# Patient Record
Sex: Male | Born: 1950
Health system: Southern US, Community
[De-identification: ages and names within clinical notes are randomized; demographics above are authoritative.]

## PROBLEM LIST (undated history)

## (undated) DIAGNOSIS — I351 Nonrheumatic aortic (valve) insufficiency: Secondary | ICD-10-CM

## (undated) DIAGNOSIS — K449 Diaphragmatic hernia without obstruction or gangrene: Secondary | ICD-10-CM

## (undated) DIAGNOSIS — E119 Type 2 diabetes mellitus without complications: Secondary | ICD-10-CM

## (undated) DIAGNOSIS — R6881 Early satiety: Secondary | ICD-10-CM

## (undated) DIAGNOSIS — R011 Cardiac murmur, unspecified: Secondary | ICD-10-CM

## (undated) DIAGNOSIS — M1990 Unspecified osteoarthritis, unspecified site: Secondary | ICD-10-CM

## (undated) DIAGNOSIS — K0889 Other specified disorders of teeth and supporting structures: Secondary | ICD-10-CM

## (undated) DIAGNOSIS — Q2543 Congenital aneurysm of aorta: Secondary | ICD-10-CM

## (undated) DIAGNOSIS — G4730 Sleep apnea, unspecified: Secondary | ICD-10-CM

## (undated) DIAGNOSIS — E785 Hyperlipidemia, unspecified: Secondary | ICD-10-CM

## (undated) DIAGNOSIS — G629 Polyneuropathy, unspecified: Secondary | ICD-10-CM

## (undated) DIAGNOSIS — F419 Anxiety disorder, unspecified: Secondary | ICD-10-CM

## (undated) DIAGNOSIS — T7840XA Allergy, unspecified, initial encounter: Secondary | ICD-10-CM

## (undated) DIAGNOSIS — I4892 Unspecified atrial flutter: Secondary | ICD-10-CM

## (undated) DIAGNOSIS — G4489 Other headache syndrome: Principal | ICD-10-CM

## (undated) DIAGNOSIS — I10 Essential (primary) hypertension: Secondary | ICD-10-CM

## (undated) DIAGNOSIS — K222 Esophageal obstruction: Secondary | ICD-10-CM

## (undated) DIAGNOSIS — I509 Heart failure, unspecified: Secondary | ICD-10-CM

## (undated) DIAGNOSIS — G4733 Obstructive sleep apnea (adult) (pediatric): Secondary | ICD-10-CM

## (undated) DIAGNOSIS — K219 Gastro-esophageal reflux disease without esophagitis: Secondary | ICD-10-CM

## (undated) DIAGNOSIS — I7121 Aneurysm of the ascending aorta, without rupture: Secondary | ICD-10-CM

## (undated) HISTORY — DX: Other headache syndrome: G44.89

## (undated) HISTORY — DX: Early satiety: R68.81

## (undated) HISTORY — DX: Essential (primary) hypertension: I10

## (undated) HISTORY — DX: Other specified disorders of teeth and supporting structures: K08.89

## (undated) HISTORY — DX: Obstructive sleep apnea (adult) (pediatric): G47.33

## (undated) HISTORY — DX: Hyperlipidemia, unspecified: E78.5

## (undated) HISTORY — DX: Nonrheumatic aortic (valve) insufficiency: I35.1

## (undated) HISTORY — DX: Esophageal obstruction: K22.2

## (undated) HISTORY — PX: HERNIA REPAIR: SHX51

## (undated) HISTORY — DX: Gastro-esophageal reflux disease without esophagitis: K21.9

## (undated) HISTORY — DX: Type 2 diabetes mellitus without complications: E11.9

## (undated) HISTORY — PX: CARDIAC VALVE REPLACEMENT: SHX585

## (undated) HISTORY — PX: TONSILLECTOMY: SUR1361

## (undated) HISTORY — DX: Cardiac murmur, unspecified: R01.1

## (undated) HISTORY — DX: Allergy, unspecified, initial encounter: T78.40XA

## (undated) HISTORY — DX: Diaphragmatic hernia without obstruction or gangrene: K44.9

## (undated) HISTORY — DX: Anxiety disorder, unspecified: F41.9

## (undated) HISTORY — DX: Polyneuropathy, unspecified: G62.9

## (undated) HISTORY — DX: Unspecified osteoarthritis, unspecified site: M19.90

---

## 1992-08-03 HISTORY — PX: AORTIC VALVE REPLACEMENT: SHX41

## 1998-11-01 ENCOUNTER — Emergency Department (HOSPITAL_COMMUNITY): Admission: EM | Admit: 1998-11-01 | Discharge: 1998-11-01 | Payer: Self-pay | Admitting: Emergency Medicine

## 1998-11-01 ENCOUNTER — Encounter: Payer: Self-pay | Admitting: Emergency Medicine

## 1999-02-20 ENCOUNTER — Ambulatory Visit (HOSPITAL_COMMUNITY): Admission: RE | Admit: 1999-02-20 | Discharge: 1999-02-20 | Payer: Self-pay | Admitting: Cardiology

## 2000-08-03 HISTORY — PX: GANGLION CYST EXCISION: SHX1691

## 2000-08-27 ENCOUNTER — Other Ambulatory Visit: Admission: RE | Admit: 2000-08-27 | Discharge: 2000-08-27 | Payer: Self-pay | Admitting: Orthopedic Surgery

## 2000-09-07 DIAGNOSIS — K2900 Acute gastritis without bleeding: Secondary | ICD-10-CM | POA: Insufficient documentation

## 2000-09-16 ENCOUNTER — Encounter: Payer: Self-pay | Admitting: Internal Medicine

## 2000-09-16 ENCOUNTER — Ambulatory Visit (HOSPITAL_COMMUNITY): Admission: RE | Admit: 2000-09-16 | Discharge: 2000-09-16 | Payer: Self-pay | Admitting: *Deleted

## 2000-10-21 ENCOUNTER — Ambulatory Visit (HOSPITAL_COMMUNITY): Admission: RE | Admit: 2000-10-21 | Discharge: 2000-10-21 | Payer: Self-pay | Admitting: Internal Medicine

## 2000-10-21 ENCOUNTER — Encounter: Payer: Self-pay | Admitting: Internal Medicine

## 2000-10-21 DIAGNOSIS — K449 Diaphragmatic hernia without obstruction or gangrene: Secondary | ICD-10-CM | POA: Insufficient documentation

## 2001-07-03 HISTORY — PX: VASECTOMY: SHX75

## 2001-07-03 HISTORY — PX: HIATAL HERNIA REPAIR: SHX195

## 2001-08-01 ENCOUNTER — Encounter: Payer: Self-pay | Admitting: *Deleted

## 2001-08-01 ENCOUNTER — Encounter: Admission: RE | Admit: 2001-08-01 | Discharge: 2001-08-01 | Payer: Self-pay | Admitting: *Deleted

## 2001-08-02 ENCOUNTER — Encounter (INDEPENDENT_AMBULATORY_CARE_PROVIDER_SITE_OTHER): Payer: Self-pay | Admitting: *Deleted

## 2001-08-02 ENCOUNTER — Ambulatory Visit (HOSPITAL_BASED_OUTPATIENT_CLINIC_OR_DEPARTMENT_OTHER): Admission: RE | Admit: 2001-08-02 | Discharge: 2001-08-02 | Payer: Self-pay | Admitting: *Deleted

## 2001-12-08 ENCOUNTER — Encounter: Payer: Self-pay | Admitting: *Deleted

## 2001-12-08 ENCOUNTER — Inpatient Hospital Stay (HOSPITAL_COMMUNITY): Admission: EM | Admit: 2001-12-08 | Discharge: 2001-12-11 | Payer: Self-pay | Admitting: *Deleted

## 2003-03-29 ENCOUNTER — Ambulatory Visit (HOSPITAL_COMMUNITY): Admission: RE | Admit: 2003-03-29 | Discharge: 2003-03-29 | Payer: Self-pay | Admitting: Internal Medicine

## 2003-03-29 ENCOUNTER — Encounter: Payer: Self-pay | Admitting: Internal Medicine

## 2003-03-29 DIAGNOSIS — K219 Gastro-esophageal reflux disease without esophagitis: Secondary | ICD-10-CM

## 2003-03-29 DIAGNOSIS — K222 Esophageal obstruction: Secondary | ICD-10-CM

## 2004-04-08 ENCOUNTER — Ambulatory Visit (HOSPITAL_COMMUNITY): Admission: RE | Admit: 2004-04-08 | Discharge: 2004-04-08 | Payer: Self-pay | Admitting: Gastroenterology

## 2005-02-24 ENCOUNTER — Encounter: Admission: RE | Admit: 2005-02-24 | Discharge: 2005-02-24 | Payer: Self-pay | Admitting: Neurological Surgery

## 2006-06-04 ENCOUNTER — Ambulatory Visit: Payer: Self-pay | Admitting: Family Medicine

## 2007-05-05 ENCOUNTER — Ambulatory Visit: Payer: Self-pay | Admitting: Family Medicine

## 2007-05-05 DIAGNOSIS — K089 Disorder of teeth and supporting structures, unspecified: Secondary | ICD-10-CM | POA: Insufficient documentation

## 2007-09-28 ENCOUNTER — Encounter (INDEPENDENT_AMBULATORY_CARE_PROVIDER_SITE_OTHER): Payer: Self-pay | Admitting: Family Medicine

## 2008-05-10 ENCOUNTER — Ambulatory Visit: Payer: Self-pay | Admitting: Family Medicine

## 2008-08-08 ENCOUNTER — Ambulatory Visit: Payer: Self-pay | Admitting: Family Medicine

## 2008-08-08 DIAGNOSIS — E785 Hyperlipidemia, unspecified: Secondary | ICD-10-CM

## 2008-08-08 DIAGNOSIS — E1169 Type 2 diabetes mellitus with other specified complication: Secondary | ICD-10-CM | POA: Insufficient documentation

## 2008-08-08 DIAGNOSIS — R6881 Early satiety: Secondary | ICD-10-CM | POA: Insufficient documentation

## 2008-08-08 DIAGNOSIS — I10 Essential (primary) hypertension: Secondary | ICD-10-CM

## 2008-08-10 ENCOUNTER — Encounter (INDEPENDENT_AMBULATORY_CARE_PROVIDER_SITE_OTHER): Payer: Self-pay | Admitting: *Deleted

## 2008-08-10 LAB — CONVERTED CEMR LAB
ALT: 29 units/L (ref 0–53)
Albumin: 4.3 g/dL (ref 3.5–5.2)
Amylase: 80 units/L (ref 27–131)
BUN: 13 mg/dL (ref 6–23)
Basophils Absolute: 0 10*3/uL (ref 0.0–0.1)
Basophils Relative: 0.4 % (ref 0.0–3.0)
CO2: 24 meq/L (ref 19–32)
Calcium: 9.7 mg/dL (ref 8.4–10.5)
Creatinine, Ser: 0.9 mg/dL (ref 0.4–1.5)
Eosinophils Relative: 1.2 % (ref 0.0–5.0)
Hemoglobin: 15.8 g/dL (ref 13.0–17.0)
Lipase: 26 units/L (ref 11.0–59.0)
Lymphocytes Relative: 26.7 % (ref 12.0–46.0)
MCHC: 33.6 g/dL (ref 30.0–36.0)
MCV: 90.9 fL (ref 78.0–100.0)
Neutro Abs: 5.7 10*3/uL (ref 1.4–7.7)
RBC: 5.16 M/uL (ref 4.22–5.81)
Total Bilirubin: 1.1 mg/dL (ref 0.3–1.2)
Total Protein: 7.1 g/dL (ref 6.0–8.3)

## 2008-09-24 ENCOUNTER — Ambulatory Visit: Payer: Self-pay | Admitting: Internal Medicine

## 2008-09-24 DIAGNOSIS — R142 Eructation: Secondary | ICD-10-CM

## 2008-09-24 DIAGNOSIS — R143 Flatulence: Secondary | ICD-10-CM

## 2008-09-24 DIAGNOSIS — R141 Gas pain: Secondary | ICD-10-CM

## 2008-09-26 ENCOUNTER — Ambulatory Visit (HOSPITAL_COMMUNITY): Admission: RE | Admit: 2008-09-26 | Discharge: 2008-09-26 | Payer: Self-pay | Admitting: Internal Medicine

## 2008-12-04 ENCOUNTER — Telehealth (INDEPENDENT_AMBULATORY_CARE_PROVIDER_SITE_OTHER): Payer: Self-pay | Admitting: *Deleted

## 2008-12-10 ENCOUNTER — Encounter: Payer: Self-pay | Admitting: Internal Medicine

## 2008-12-10 DIAGNOSIS — K828 Other specified diseases of gallbladder: Secondary | ICD-10-CM | POA: Insufficient documentation

## 2008-12-14 ENCOUNTER — Ambulatory Visit (HOSPITAL_COMMUNITY): Admission: RE | Admit: 2008-12-14 | Discharge: 2008-12-14 | Payer: Self-pay | Admitting: Internal Medicine

## 2009-01-24 ENCOUNTER — Encounter: Payer: Self-pay | Admitting: Family Medicine

## 2009-04-30 ENCOUNTER — Encounter: Payer: Self-pay | Admitting: Family Medicine

## 2009-05-17 ENCOUNTER — Encounter: Payer: Self-pay | Admitting: Family Medicine

## 2009-06-10 ENCOUNTER — Ambulatory Visit: Payer: Self-pay | Admitting: Family

## 2009-06-10 DIAGNOSIS — G5600 Carpal tunnel syndrome, unspecified upper limb: Secondary | ICD-10-CM | POA: Insufficient documentation

## 2009-06-10 DIAGNOSIS — H6120 Impacted cerumen, unspecified ear: Secondary | ICD-10-CM

## 2009-06-10 DIAGNOSIS — M25519 Pain in unspecified shoulder: Secondary | ICD-10-CM

## 2009-06-10 DIAGNOSIS — Z954 Presence of other heart-valve replacement: Secondary | ICD-10-CM | POA: Insufficient documentation

## 2009-06-13 ENCOUNTER — Encounter: Payer: Self-pay | Admitting: Internal Medicine

## 2009-09-03 ENCOUNTER — Encounter: Payer: Self-pay | Admitting: Family

## 2009-11-21 ENCOUNTER — Encounter: Payer: Self-pay | Admitting: Family Medicine

## 2009-11-21 ENCOUNTER — Encounter (INDEPENDENT_AMBULATORY_CARE_PROVIDER_SITE_OTHER): Payer: Self-pay | Admitting: *Deleted

## 2009-11-21 LAB — CONVERTED CEMR LAB
Albumin: 4.3 g/dL
Calcium: 9.1 mg/dL
Creatinine, Ser: 0.92 mg/dL
Globulin: 2.4 g/dL
Glucose, Bld: 89 mg/dL
HDL: 34 mg/dL
LDL Cholesterol: 71 mg/dL
Total Protein: 6.7 g/dL
Triglycerides: 98 mg/dL

## 2009-11-25 ENCOUNTER — Encounter: Payer: Self-pay | Admitting: Cardiology

## 2009-11-25 ENCOUNTER — Encounter: Payer: Self-pay | Admitting: Family Medicine

## 2010-03-11 ENCOUNTER — Encounter: Payer: Self-pay | Admitting: Family Medicine

## 2010-05-28 ENCOUNTER — Ambulatory Visit: Payer: Self-pay | Admitting: Cardiology

## 2010-05-29 ENCOUNTER — Ambulatory Visit: Payer: Self-pay | Admitting: Family Medicine

## 2010-09-04 NOTE — Letter (Signed)
Summary: [facility]   Imported By: Lanelle Bal 12/10/2009 09:10:23  _____________________________________________________________________  External Attachment:    Type:   Image     Comment:   External Document

## 2010-09-04 NOTE — Letter (Signed)
Summary: [facility]   Imported By: Lanelle Bal 03/20/2010 11:21:56  _____________________________________________________________________  External Attachment:    Type:   Image     Comment:   External Document

## 2010-09-04 NOTE — Miscellaneous (Signed)
Summary: labs-Dr. Deborah Chalk  Clinical Lists Changes  Observations: Added new observation of TRIGLYC TOT: 98 mg/dL (16/05/9603 54:09) Added new observation of LDL: 71 mg/dL (81/19/1478 29:56) Added new observation of HDL: 34 mg/dL (21/30/8657 84:69) Added new observation of CHOLESTEROL: 125 mg/dL (62/95/2841 32:44) Added new observation of BILI TOTAL: 1.0 mg/dL (08/05/7251 66:44) Added new observation of ALK PHOS: 69 units/L (11/21/2009 10:10) Added new observation of SGPT (ALT): 28 units/L (11/21/2009 10:10) Added new observation of SGOT (AST): 24 units/L (11/21/2009 10:10) Added new observation of PROTEIN, TOT: 6.7 g/dL (03/47/4259 56:38) Added new observation of GLOBULIN TOT: 2.4 g/dL (75/64/3329 51:88) Added new observation of ALBUMIN: 4.3 g/dL (41/66/0630 16:01) Added new observation of CALCIUM: 9.1 mg/dL (09/32/3557 32:20) Added new observation of GLUCOSE SER: 89 mg/dL (25/42/7062 37:62) Added new observation of CREATININE: 0.92 mg/dL (83/15/1761 60:73) Added new observation of BUN: 16 mg/dL (71/01/2693 85:46) Added new observation of CO2 TOTAL: 19 mmol/L (11/21/2009 10:10) Added new observation of CHLORIDE: 105 mmol/L (11/21/2009 10:10) Added new observation of POTASSIUM: 4.5 mmol/L (11/21/2009 10:10) Added new observation of SODIUM: 138 mmol/L (11/21/2009 10:10)

## 2010-09-04 NOTE — Letter (Signed)
Summary: [facility]  [facility]   Imported By: Lanelle Bal 09/10/2009 09:11:12  _____________________________________________________________________  External Attachment:    Type:   Image     Comment:   External Document

## 2010-09-04 NOTE — Assessment & Plan Note (Signed)
Summary: FLU SHOT/KN  Nurse Visit   Allergies: 1)  ! Penicillin 2)  ! * Quinadine  Orders Added: 1)  Admin 1st Vaccine [90471] 2)  Flu Vaccine 54yrs + [69629] Flu Vaccine Consent Questions     Do you have a history of severe allergic reactions to this vaccine? no    Any prior history of allergic reactions to egg and/or gelatin? no    Do you have a sensitivity to the preservative Thimersol? no    Do you have a past history of Guillan-Barre Syndrome? no    Do you currently have an acute febrile illness? no    Have you ever had a severe reaction to latex? no    Vaccine information given and explained to patient? yes    Are you currently pregnant? no    Lot Number:[id number]   Exp Date:01/31/2011   Site Given Right Deltoid IM .lbflu

## 2010-09-25 ENCOUNTER — Encounter: Payer: Self-pay | Admitting: Family Medicine

## 2010-10-09 NOTE — Letter (Signed)
Summary: [facility]  [facility]   Imported By: Maryln Gottron 09/30/2010 10:25:56  _____________________________________________________________________  External Attachment:    Type:   Image     Comment:   External Document

## 2010-11-19 ENCOUNTER — Telehealth: Payer: Self-pay | Admitting: Cardiology

## 2010-11-19 NOTE — Telephone Encounter (Signed)
RETURNING YOUR CALL

## 2010-11-20 NOTE — Telephone Encounter (Signed)
Appointment scheduled for pt to f/u with Dr. Shirlee Latch on 01/19/11.

## 2010-12-19 NOTE — Consult Note (Signed)
[facility]  Patient:    [name] [name], SZCZESNIAK Visit Number: [id number] MRN: [id number]          Service Type: MED Location: 3W [phone] Attending Physician:  Dalbert Mayotte Dictated by:   Clovis Pu Patty Sermons, M.D. Proc. Date: 12/08/01 Admit Date:  12/08/2001   CC:         Al Decant. Janey Greaser, M.D.  Radene Knee., M.D.  Colleen Can. Deborah Chalk, M.D.   Consultation Report  HISTORY:  This is a 60 year old, married, Caucasian gentleman admitted with acute prostatitis, leukocytosis, and fever if 103.  White count is 19,000. The onset of the illness was yesterday morning.  The patient was initially seen by Dr. Elige Radon yesterday and was given outpatient antibiotics, but, because of worsening clinical status, was reevaluated today and admitted to the hospital for IV antibiotics.  The patient has a history of having had congenital aortic valve disease with severe aortic stenosis and insufficiency and in 1994 underwent the Ross procedure by Dr. ______ in [place]. [facility]. Postoperatively, the patient did well except for transient atrial fibrillation.  At the present time, the patient denies having any recent cardiac symptoms, bradycardia, dyspnea, chest pain, palpitations, dizziness, syncope, etc.  MEDICATIONS: 1. Atenolol 25 mg daily. 2. Diovan 80 mg daily. 3. Lipitor 10 mg daily.  His most recent 2-D echocardiogram in the office on August 23, 2000, showed a dilated aortic root at 5.1, ejection fraction 50%.  Left ventricular end-diastolic dimension was 6.0 with normally functioning valve.  FAMILY HISTORY:  Noncontributory.  SOCIAL HISTORY:  The patient is a nonsmoker.  He previously had drunk some alcohol but recently is on no alcohol.  The patient has an Designer, industrial/product job.  REVIEW OF SYSTEMS:  Negative except for recent symptoms of prostatitis for the past two days.  PHYSICAL EXAMINATION:  VITAL SIGNS:  Blood pressure 132/70, pulse 88,  temperature 103, respirations 20.  HEENT:  Pupils are equal and reactive.  Sclerae are clear.  No conjunctival hemorrhages or exudate.  SKIN:  Flushed.  Mouth and tongue normal.  NECK:  Jugular venous pressure is normal.  Carotids have a normal upstroke with loud bruits audible radiating from the base of the heart into the carotids bilaterally.  There is no thyroid enlargement.  There is no lymphadenopathy.  CHEST:  Clear to auscultation.  HEART:  Grade 2/6 harsh systolic ejection murmur at the base, radiating to the he neck.  There is no aortic insufficiency; there is no S3; there is a widely split second sound.  ABDOMEN:  No hepatosplenomegaly, tenderness, or mass.  EXTREMITIES:  Show good pulses, no edema, no phlebitis.  There are no splinter hemorrhages.  DIAGNOSTIC DATA:  EKG and chest x-ray are pending.  IMPRESSION: 1. Acute prostatitis. 2. Status post Ross procedure in 1994 for severe aortic stenosis and    insufficiency.  At the present time, the aortic homograph appears to be    functioning well.  RECOMMENDATIONS:  Agree with aggressive IV antibiotic therapy as outlined by Dr. Elige Radon.  We will continue his present cardiac medications, Diovan and atenolol.  We will also add Ecotrin 81 mg daily for long-term cardiac protection. Dictated by:   Clovis Pu Patty Sermons, M.D. Attending Physician:  Dalbert Mayotte DD:  12/08/01 TD:  12/08/01 Job: [id number]

## 2010-12-19 NOTE — Op Note (Signed)
NAME:  [name] [name], [name] [name] NO.:  [id number]   MEDICAL RECORD NO.:  [id number]                   PATIENT TYPE:  AMB   LOCATION:  ENDO                                 FACILITY:  [facility]   PHYSICIAN:  Graylin Shiver, M.D.                DATE OF BIRTH:  1950-10-21   DATE OF PROCEDURE:  04/08/2004  DATE OF DISCHARGE:                                 OPERATIVE REPORT   PROCEDURE:  Colonoscopy.   INDICATIONS:  Screening.   Informed consent was obtained after explanation of the risks of bleeding,  infection, and perforation.   PREMEDICATION:  Fentanyl 62.5 mcg IV, Versed 6 mg IV.  The patient was also  given vancomycin 1 g IV and gentamicin 100 mg IV for endocarditis  prophylaxis.   PROCEDURE:  With the patient in the left lateral decubitus position, a  rectal exam was performed.  No masses were felt.  The Olympus colonoscope  was inserted into the rectum and advanced around the colon to the cecum.  Cecal landmarks were identified.  The scope was brought out visualizing the  mucosa.  There were some scattered diverticula noted in the right colon and  left colon.  The exam was otherwise normal from cecum to rectum.  He  tolerated the procedure well without complications.   IMPRESSION:  Scattered diverticula, otherwise normal colonoscopy to the  cecum.   I would recommend a repeat screening colonoscopy again in 10 years.                                               Graylin Shiver, M.D.    SFG/MEDQ  D:  04/08/2004  T:  04/08/2004  Job:  [id number]   cc:   Colleen Can. Deborah Chalk, M.D.  Fax: [phone]

## 2010-12-19 NOTE — Discharge Summary (Signed)
[facility]  Patient:    [name] [name], [name] [name] Visit Number: [id number] MRN: [id number]          Service Type: MED Location: 3W [phone] Attending Physician:  Dalbert Mayotte Dictated by:   Radene Knee., M.D. Admit Date:  12/08/2001 Discharge Date: 12/11/2001   CC:         Colleen Can. Deborah Chalk, M.D.  Thomas B. Samuella Cota, M.D.   Discharge Summary  HISTORY OF PRESENT ILLNESS:  This 60 year old male was admitted with a two-day history of fever, dysuria and had been started on antibiotics in the office the day the symptoms started.  When he came to the office, he had a temperature of 103 and was admitted for IV antibiotics.  The culture obtained in the office did grow a gram-negative rod which was sensitive to all antibiotics tested.  The patient had been given gentamicin and Cipro in the office.  HOSPITAL COURSE:  In the hospital, he was continued on IV Cipro and IV tobramycin.  He voided frequently with dysuria for about eight hours and then went into retention.  He had to have a #16 Foley catheter inserted. Thereafter, he felt much better and his temperature came down.  After the first 48 hours, he became afebrile.  He remained afebrile and the investigation in the office with ultrasound revealed no hydronephrosis, right or left, and 1-3 ounces in the bladder.  The chest x-ray and KUB done in the hospital suggested that he had cardiomegaly which is an old finding.  There was no evidence of stones or any abnormality in the lungs, but he had evidence of a median sternotomy.  ALLERGIES:  PENICILLIN and QUINIDINE.  MEDICATIONS: 1. Diovan 80 mg daily. 2. Atenolol 25 mg daily. 3. Lipitor 10 mg daily from Dr. Deborah Chalk.  PAST MEDICAL HISTORY: 1. Chronic prostatitis and BPH in 2001.  His PSA in July 2002, was 1.7. 2. In 1994, he had an aortic valve Homograft for severe aortic stenosis and    has not required anticoagulation. 3. In January 2002,  he had a ganglion removed from his wrist. 4. In December 2002, he had bilateral inguinal hernia repair and vasectomy by    Dr. Samuella Cota.  FAMILY HISTORY:  Positive for prostate cancer and hypertension.  SOCIAL HISTORY:  The patient does not abuse tobacco, but he does drink two beers per day.  He is married.  REVIEW OF SYSTEMS:  Entirely normal.  PHYSICAL EXAMINATION:  VITAL SIGNS:  Temperature 103, blood pressure 123/70, pulse 85.  HEENT:  Unremarkable except for a dry tongue.  SKIN:  Hot and dry.  CARDIAC:  Rough systolic murmur with normal sinus rhythm.  LUNGS:  Clear to auscultation and percussion.  There is a median sternotomy scar.  ABDOMEN:  Free of masses.  Liver, kidney, spleen and hernia not detected.  He did have bilateral inguinal hernia scars with slight thickening of the epididymis.  GENITALIA:  External genitalia revealed a normal circumcised with normal meatus and testes.  RECTAL:  Tone was good.  The prostate was very tender, markedly enlarged, rounded, firm, 40 g and symmetrical.  EXTREMITIES:  No edema, good peripheral pulses.  NEUROLOGIC:  Intact.  SKIN:  No lesions.  LYMPHATICS:  No nodes noted.  IMPRESSION: 1. Acute prostatitis with urinary retention. 2. Benign prostatic hypertrophy and chronic prostatitis with onset in 2001. 3. Aortic valve replaced in 1994, with Homograft for aortic stenosis. 4. Hypertension. 5. Allergies to penicillin and quinidine.  PROCEDURES:  None.  DIET:  Regular diet.  ACTIVITY:  Light activity with Foley catheter in place.  DISCHARGE MEDICATIONS: 1. Cipro 500 mg b.i.d. 2. Diovan. 3. Atenolol. 4. Lipitor.  FOLLOWUP:  Come to the office on Monday for removal of his catheter and voiding trial.  We will place him at that time on Proscar 5 mg daily and Flomax 0.4 mg daily and follow him closely.  When his infection is cleared, we will repeat his PSA.  CONDITION ON DISCHARGE:  Improved. Dictated by:   Radene Knee., M.D. Attending Physician:  Dalbert Mayotte DD:  12/11/01 TD:  12/13/01 Job: [id number]

## 2010-12-19 NOTE — Op Note (Signed)
[place]  Patient:    [name], [name] Visit Number: [id number] MRN: [id number]          Service Type: DSU Location: [facility] Attending Physician:  Kandis Mannan Dictated by:   Donnie Coffin Samuella Cota, M.D. Proc. Date: 08/02/01 Admit Date:  08/02/2001   CC:         Radene Knee., M.D.   Operative Report  CCS# [id number]  PREOPERATIVE DIAGNOSIS: 1. Bilateral inguinal hernias. 2. Desires bilateral partial vasectomy.  POSTOPERATIVE DIAGNOSIS: 1. Bilateral inguinal hernias. 2. Desires bilateral partial vasectomy.  OPERATION PERFORMED: 1. Bilateral inguinal herniorrhaphy with mesh. 2. Bilateral partial vasectomy.  SURGEON:  Maisie Fus B. Samuella Cota, M.D.  ANESTHESIA:  General.  ANESTHESIOLOGIST:  Dr. Michelle Piper and CRNA.  DESCRIPTION OF PROCEDURE:  The patient was taken to the operating room and placed on the table in supine position.  After satisfactory general anesthetic with LMA intubation, the lower abdomen was prepped and draped in a sterile field.  The left side was operated upon first.  An oblique incision was outlined with a skin marker.  The incision made through skin and subcutaneous tissues.  Bleeders were cauterized with a Bovie.  The external oblique aponeurosis was divided in line with its fibers to and through the external ring.  The patient had a large ilioinguinal nerve which was seen and protected medially.  With the nerve exposed, about 5 cc of local which was 0.25% Marcaine without epinephrine and 1% Xylocaine without epinephrine was used to block the ilioinguinal nerve.  The cord structures were isolated with the Penrose drain.  The patient was noted to have a large direct hernia coming off inferior to the recurrent epigastric vessels.  There was no indirect hernia sac.  The large direct hernia was imbricated using a running suture of 0 chromic catgut.  A piece of 3 inch x 6 inch Atrium mesh was then fashioned to cover the entire  inguinal floor and extended around the cord structures at the internal ring.  The mesh was anchored inferiorly with a running suture of 2-0 Novofil with the first suture being placed in the pubic tubercle.  The other sutures in the shelving edge of Pouparts ligament.  The mesh was anchored superiorly and medially with interrupted sutures of 0 Novofil.  A slit was made in the mesh to accommodate the cord structures of the internal ring.  The mesh was then sutured to itself with 3-0 Vicryl superior and lateral to the internal ring.  There was sufficient room for the cord structures at the internal ring.  The vas was then identified and about a 1 cm segment of the vas was excised.  The two ends proximally and distally were then folded back on themselves and doubly ligated with the suture ligature of 0 chromic catgut. The wound was irrigated, hemostasis was obtained.  The cord structures and ilioinguinal nerve were then returned to their normal anatomical position and the external oblique aponeurosis reapproximated with 3-0 Vicryl sutures.  The subcutaneous closed with 3-0 Vicryl.  The skin was then closed with a running subcuticular suture of 4-0 Vicryl.  Attention was then directed to the right side where a mirror image incision was made through skin and subcutaneous tissue.  Bleeders were controlled with the cautery.  The external oblique aponeurosis was divided in line with its fibers to and through the external ring.  On this side the patient had a large ilioinguinal nerve which branched into two branches and  these two branches were protected.  This nerve was also blocked with about 5 cc of local.  On this side, the patient also had a direct inguinal hernia but it was not as large as the one on the left side.  The hernia was reduced and imbricated with a running suture of 0 chromic catgut. There was no indirect hernia sac on this side either.  A piece of 3 inch x 6 inch Atrium mesh was  fashioned to cover the entire inguinal floor.  It was anchored inferiorly with a running suture of 2-0 Novofil with the first suture being placed in the pubic tubercle, the other sutures in the shelving edge of Pouparts ligament.  It was anchored superiorly and medially with interrupted sutures of 0 Vicryl.  A slit was made in the mesh to accommodate cords the internal ring and the mesh was sutured to itself superior and lateral to the internal ring.  The vas on this side was then identified and about a 1 cm segment removed.  The two ends were then turned back on themselves and doubly ligated with a suture ligature of 0 chromic catgut.  The wound was irrigated. Both vas had been sent separately for pathology.  The nerve was preserved and the external oblique aponeurosis was reapproximated with interrupted sutures of 3-0 Vicryl.  The subcutaneous tissues closed with 3-0 Vicryl suture.  The skin was closed with a running subcuticular suture of 4-0 Vicryl suture. Benzoin and half inch Steri-Strips were then applied to both wounds.  Dry sterile dressing was then applied.  The patient seemed to tolerate the procedure well and was taken to the PACU in satisfactory condition. Dictated by:   Donnie Coffin Samuella Cota, M.D. Attending Physician:  Kandis Mannan DD:  08/02/01 TD:  08/02/01 Job: [id number]

## 2010-12-19 NOTE — H&P (Signed)
[facility]  Patient:    [name] [name], [name] [name] Visit Number: [id number] MRN: [id number]          Service Type: MED Location: 3W [phone] Attending Physician:  Dalbert Mayotte Dictated by:   Radene Knee., M.D. Admit Date:  12/08/2001                           History and Physical  CHIEF COMPLAINT:  This patient, age 60, is admitted with a chief complaint of dysuria and fever, onset Dec 07, 2001.  HISTORY OF PRESENT ILLNESS:  This patient with a history of chronic prostatitis and elevated PSA dating back to 2001, initially had antibiotics, and his PSA came down to 1.9.  The patient did reasonably well and in December had bilateral hernia repair and vasectomy by Dr. Samuella Cota and continued to do well until Dec 07, 2001, when he woke up, had terminal hematuria, dysuria, temperature of 101.  Was seen in our office where he had pyuria.  A culture was obtained.  He was started on gentamicin 160 mg daily, Cipro 500 mg b.i.d. Came back to the office on Dec 08, 2001, temperature was 102.3.  He was voiding every 30-60 minutes, still had dysuria.  No blood, however.  Because of his increased temperature and the white count we had obtained in the office of 15.5, hematocrit 46, creatinine 0.9, he was recommended for an urgent admission.  The patients last PSA July 2002, was 1.8.  ALLERGIES:  PENICILLIN and QUINIDINE.  PRESENT MEDICATIONS:  Atenolol and Diovan, the doses are not known by the patient but were given to him by Dr. Deborah Chalk.  He was also taking Lipitor.  PAST MEDICAL HISTORY: 1. High blood pressure. 2. Hiatal hernia and vasectomy December 2002, as noted above. 3. Aortic valve replacement September 1994. 4. Ganglion removed from wrist January 2002.  FAMILY HISTORY:  Positive for high blood pressure and prostate cancer, but negative for diabetes, stones, and kidney disease.  The patients father is living.  Mother died in an auto accident.  He has  a brother with leukemia.  SOCIAL HISTORY:  The patient is accustomed to drinking two beers per day.  He does not use tobacco.  He is employed.  He is married, has three children.  REVIEW OF SYSTEMS:  HEENT:  Unremarkable except for a little headache and dizziness occasionally.  CARDIORESPIRATORY:  Denies any chest pain, shortness of breath, heart attack.  GASTROINTESTINAL:  No hepatitis, stomach ulcers, or diarrhea.  EXTREMITIES:  Mild arthritis.  NEUROPSYCHIATRIC:  Unremarkable. LYMPHATIC:  No nodes noted.  No history of anemia.  SKIN:  No recent rashes or lesions noted.  PHYSICAL EXAMINATION:  VITAL SIGNS:  Temperature 102.8, pulse 87, respirations 17, blood pressure 118/68.  HEENT:  Ears and tympanic membranes unremarkable.  Eyes react normally to light and accommodation.  Extraocular movements intact.  Pharynx benign. Tongue is a little dry.  NECK:  No enlargement of thyroid or nodes palpable.  CHEST:  Clear to percussion and auscultation.  HEART:  Normal sinus rhythm.  No murmur detected.  ABDOMEN:  Mildly obese.  There is a median sternotomy scar and bilateral inguinal scars.  Liver, kidney, spleen, masses, tenderness not detected.  GENITOURINARY:  External genitalia and meatus normal.  Penis circumcised. Testes good size and symmetrical.  RECTAL:  Scrotum, anus, perineum normal.  Rectal tone is good.  Prostate is markedly tender and enlarged, 40 g,  round, normal, smooth, symmetrical.  EXTREMITIES:  No edema.  Good peripheral pulses.  NEUROLOGIC:  Grossly normal reflexes and sensation.  SKIN:  No lesions noted,.  LYMPHATIC:  No nodes noted.  DIAGNOSIS: 1. Acute prostatitis, onset Dec 07, 2001. 2. Chronic prostatitis and benign prostatic hypertrophy dating back to 2002. 3. Aortic valve replacement in 1994. 4. Osteoarthritis, mild. 5. Bilateral hernia repair and vasectomy December 2002.  PLAN:  We will admit him.  Give him IV tobramycin, and continue Cipro  pending culture reports.  IV fluids.  CBC, CMET, chest x-ray, EKG. Dictated by:   Radene Knee., M.D. Attending Physician:  Dalbert Mayotte DD:  12/08/01 TD:  12/10/01 Job: [id number]

## 2010-12-23 ENCOUNTER — Telehealth: Payer: Self-pay | Admitting: Cardiology

## 2010-12-23 NOTE — Telephone Encounter (Signed)
Has a question regarding his prescriptions

## 2010-12-23 NOTE — Telephone Encounter (Addendum)
Returned call to pt regarding prescription. No answer. Left message with call back number.

## 2010-12-24 MED ORDER — ATENOLOL 25 MG PO TABS: 25.0000 mg | ORAL_TABLET | Freq: Every day | ORAL | Status: DC

## 2010-12-24 MED ORDER — SIMVASTATIN 20 MG PO TABS: 20.0000 mg | ORAL_TABLET | Freq: Every evening | ORAL | Status: DC

## 2010-12-24 NOTE — Telephone Encounter (Signed)
Spoke to [name] [name] he is requesting his atenolol and simvastatin be called in to [facility]. Informed him the nurse would take care of that.

## 2010-12-24 NOTE — Telephone Encounter (Signed)
Med refill

## 2011-01-02 ENCOUNTER — Encounter: Payer: Self-pay | Admitting: Cardiology

## 2011-01-19 ENCOUNTER — Encounter: Payer: Self-pay | Admitting: Cardiology

## 2011-01-19 ENCOUNTER — Ambulatory Visit (INDEPENDENT_AMBULATORY_CARE_PROVIDER_SITE_OTHER): Payer: 59 | Admitting: Cardiology

## 2011-01-19 DIAGNOSIS — I359 Nonrheumatic aortic valve disorder, unspecified: Secondary | ICD-10-CM

## 2011-01-19 DIAGNOSIS — I10 Essential (primary) hypertension: Secondary | ICD-10-CM

## 2011-01-19 DIAGNOSIS — Z954 Presence of other heart-valve replacement: Secondary | ICD-10-CM

## 2011-01-19 LAB — BASIC METABOLIC PANEL
Calcium: 8.6 mg/dL (ref 8.4–10.5)
Creatinine, Ser: 0.8 mg/dL (ref 0.4–1.5)
GFR: 113.48 mL/min (ref >60.00)
Glucose, Bld: 96 mg/dL (ref 70–99)
Sodium: 138 mEq/L (ref 135–145)

## 2011-01-19 LAB — HEPATIC FUNCTION PANEL
ALT: 19 U/L (ref 0–53)
AST: 18 U/L (ref 0–37)
Albumin: 4.2 g/dL (ref 3.5–5.2)
Alkaline Phosphatase: 54 U/L (ref 39–117)

## 2011-01-19 LAB — LIPID PANEL
HDL: 35.2 mg/dL — ABNORMAL LOW (ref >39.00)
Triglycerides: 96 mg/dL (ref 0.0–149.0)

## 2011-01-19 MED ORDER — ATENOLOL 25 MG PO TABS: 25.0000 mg | ORAL_TABLET | Freq: Every day | ORAL | Status: DC

## 2011-01-19 NOTE — Assessment & Plan Note (Signed)
BP is high today though patient states that it is usually normal.  I will have him check his BP every other day and write it down.  We will call him in 2 weeks to see what it is running.  Given his risk of ascending aortic dilation, he needs good BP control.    He has not had lipids done in several years, I will check fasting lipids today.

## 2011-01-19 NOTE — Assessment & Plan Note (Signed)
Status post Ross procedure with native pulmonic valve transposed to the aortic position and a bioprosthetic pulmonic valve.  Last echo showed mild aortic insufficiency.  The ascending aorta was mildly dilated.  One of the risks with the Ross procedure is development of dilated aortic root/ascending aorta.   - Repeat echocardiogram in 4/13 - Keep BP controlled to limit tendency to dilate ascending aorta.

## 2011-01-19 NOTE — Progress Notes (Signed)
PCP: Dr. Beverely Low  60 yo with history of aortic insufficiency s/p Ross procedure presents for cardiology followup.  Patient has been seen by Dr. Deborah Chalk in the past and is seen by me for the first time today.  On last echo in 4/11, the pulmonic valve in the aortic positive had mild insufficiency and the ascending aorta was upper normal in size.  Patient has been stable medically.  He denies any chest pain episodes.  He is winded after walking up a flight of steps but he walks for exercise and golfs with no problems.  He does all his yardwork.  He has a left-sided brachial plexopathy of uncertain etiology and has atrophy of his left shoulder and upper arm.  BP is running high today at 150/94, but he says it has been normal in the past.   ECG: NSR, 1st degree AV block, poor anterior R wave progression.   PMH: 1. Aortic insufficiency: s/p Ross procedure in [place] in [place].  Echo (4/11) with hypokinetic basal septum (likely post-surgical), EF 50%, mild LVH, s/p Ross procedure with native pulmonic valve in aortic position with mild aortic insufficiency and bioprosthetic pulmonic valve with no pulmonic insufficiency, mild MR, aortic upper normal in size.  2. Post-operative atrial fibrillation after heart surgery.  3. Normal left heart cath prior to 1994 heart surgery.  4. Left brachial plexopathy with left shoulder muscle atrophy.  Uncertain etiology.  5. BPH 6. Hyperlipidemia.   FH: Prostate CA, HTN  SH: Lives in [place], works part time in a window washing business, married, nonsmoker.   ROS: All systems reviewed and negative except as per HPI.   Current Outpatient Prescriptions  Medication Sig Dispense Refill  . aspirin 81 MG chewable tablet Chew 81 mg by mouth daily.        Marland Kitchen atenolol (TENORMIN) 25 MG tablet Take 1 tablet (25 mg total) by mouth daily.  30 tablet  11  . dutasteride (AVODART) 0.5 MG capsule Take 0.5 mg by mouth daily.        . simvastatin (ZOCOR) 20 MG tablet Take 1 tablet (20  mg total) by mouth every evening.  30 tablet  3  . telmisartan (MICARDIS) 80 MG tablet Take 80 mg by mouth daily.        Marland Kitchen DISCONTD: atenolol (TENORMIN) 25 MG tablet Take 1 tablet (25 mg total) by mouth daily.  30 tablet  3    BP 150/94  Pulse 57  Resp 16  Ht 6\' 4"  (1.93 m)  Wt 230 lb (104.327 kg)  BMI 28.00 kg/m2 General: NAD, overweight Neck: No JVD, no thyromegaly or thyroid nodule.  Lungs: Clear to auscultation bilaterally with normal respiratory effort. CV: Nondisplaced PMI.  Heart regular S1/S2, widely split S2, no S3/S4, 1/6 SEM.  No peripheral edema.  No carotid bruit.  Normal pedal pulses.  Abdomen: Soft, nontender, no hepatosplenomegaly, no distention.  Neurologic: Alert and oriented x 3.  Psych: Normal affect. Extremities: No clubbing or cyanosis.  MSK: Atrophy of left shoulder muscles

## 2011-01-19 NOTE — Patient Instructions (Signed)
Lab today--lipid profile/liver profile/BMP 424.1  401.9   Take and record your blood pressure about 2 hours after you take your medication--I will call you in 2 weeks to get the readings. Luana Shu [phone]  Your physician wants you to follow-up in: 6 months with Dr Shirlee Latch. (December 2012). You will receive a reminder letter in the mail two months in advance. If you don't receive a letter, please call our office to schedule the follow-up appointment.

## 2011-01-28 ENCOUNTER — Telehealth: Payer: Self-pay | Admitting: Cardiology

## 2011-01-28 NOTE — Telephone Encounter (Signed)
BP look good.

## 2011-01-28 NOTE — Telephone Encounter (Signed)
Per pt returning call from [place].

## 2011-01-28 NOTE — Telephone Encounter (Signed)
I talked with pt. Pt states he has been checking  his BP has been 118/80 or less. I will forward to Dr Shirlee Latch for review.

## 2011-01-28 NOTE — Telephone Encounter (Signed)
Pt rtn call per pt b/p is fine

## 2011-01-30 NOTE — Telephone Encounter (Signed)
I talked with pt about recent lab results 

## 2011-01-30 NOTE — Telephone Encounter (Signed)
Pt returning your call

## 2011-01-30 NOTE — Telephone Encounter (Signed)
LMTCB

## 2011-02-25 ENCOUNTER — Ambulatory Visit (INDEPENDENT_AMBULATORY_CARE_PROVIDER_SITE_OTHER): Payer: 59 | Admitting: Family Medicine

## 2011-02-25 ENCOUNTER — Telehealth: Payer: Self-pay | Admitting: *Deleted

## 2011-02-25 ENCOUNTER — Encounter: Payer: Self-pay | Admitting: Family Medicine

## 2011-02-25 DIAGNOSIS — M19049 Primary osteoarthritis, unspecified hand: Secondary | ICD-10-CM

## 2011-02-25 DIAGNOSIS — R290 Tetany: Secondary | ICD-10-CM

## 2011-02-25 LAB — BASIC METABOLIC PANEL
BUN: 23 mg/dL (ref 6–23)
Chloride: 105 mEq/L (ref 96–112)
Creatinine, Ser: 0.8 mg/dL (ref 0.4–1.5)
GFR: 103.8 mL/min (ref >60.00)
Potassium: 3.9 mEq/L (ref 3.5–5.1)

## 2011-02-25 LAB — MAGNESIUM: Magnesium: 2.4 mg/dL (ref 1.5–2.5)

## 2011-02-25 NOTE — Progress Notes (Signed)
  Subjective:    Patient ID: [name] [name], male    DOB: 01/17/1951, 60 y.o.   MRN: [id number]  HPI Muscle spasm- was pressure washing yesterday when had drew up in intense spasm.  R hand, lasted ~1 minute.  Had severe calf cramping bilaterally right after hand spasm.  Had been doing housework (washing windows) for 4 hrs and then outside for 1 hr in 100 degree heat prior to episode.  Today pt reports things feel 'normal'  Arthritis of hands- reports he is unable to grip or turn things w/out difficulty.  Has hx of carpal tunnel.  Has not seen hand specialist recently.   Review of Systems For ROS see HPI     Objective:   Physical Exam  Constitutional: He is oriented to person, place, and time. He appears well-developed and well-nourished. No distress.  HENT:  Head: Normocephalic and atraumatic.  Eyes: Conjunctivae and EOM are normal. Pupils are equal, round, and reactive to light.  Neck: Normal range of motion. Neck supple. No thyromegaly present.  Musculoskeletal: Normal range of motion. He exhibits no edema and no tenderness.  Lymphadenopathy:    He has no cervical adenopathy.  Neurological: He is alert and oriented to person, place, and time. He has normal reflexes. No cranial nerve deficit. Coordination normal.  Skin: Skin is warm and dry.          Assessment & Plan:

## 2011-02-25 NOTE — Patient Instructions (Signed)
Schedule a physical at your convenience Increase your fluid intake We'll notify you of your lab results Someone will call you with your Dr Mina Marble appt Call with any questions or concerns Hang in there!!!

## 2011-02-26 ENCOUNTER — Other Ambulatory Visit (INDEPENDENT_AMBULATORY_CARE_PROVIDER_SITE_OTHER): Payer: 59

## 2011-02-26 DIAGNOSIS — Z0000 Encounter for general adult medical examination without abnormal findings: Secondary | ICD-10-CM

## 2011-02-26 LAB — CBC WITH DIFFERENTIAL/PLATELET
Basophils Absolute: 0 10*3/uL (ref 0.0–0.1)
Eosinophils Absolute: 0.1 10*3/uL (ref 0.0–0.7)
HCT: 43.6 % (ref 39.0–52.0)
Hemoglobin: 14.9 g/dL (ref 13.0–17.0)
Lymphs Abs: 2.2 10*3/uL (ref 0.7–4.0)
MCHC: 34.1 g/dL (ref 30.0–36.0)
Monocytes Absolute: 0.7 10*3/uL (ref 0.1–1.0)
Monocytes Relative: 9.2 % (ref 3.0–12.0)
Neutro Abs: 4.9 10*3/uL (ref 1.4–7.7)
Platelets: 258 10*3/uL (ref 150.0–400.0)
RDW: 13.4 % (ref 11.5–14.6)

## 2011-02-26 NOTE — Progress Notes (Signed)
Labs only

## 2011-02-28 LAB — VITAMIN D 1,25 DIHYDROXY
Vitamin D 1, 25 (OH)2 Total: 68 pg/mL (ref 18–72)
Vitamin D3 1, 25 (OH)2: 68 pg/mL

## 2011-03-05 ENCOUNTER — Telehealth: Payer: Self-pay

## 2011-03-05 NOTE — Telephone Encounter (Signed)
Message copied by Beverely Low on Thu Mar 05, 2011 10:04 AM ------      Message from: Sheliah Hatch      Created: Sun Mar 01, 2011  2:13 PM       Normal Vit D level

## 2011-03-05 NOTE — Telephone Encounter (Signed)
Opened in error

## 2011-03-09 ENCOUNTER — Telehealth: Payer: Self-pay | Admitting: Nurse Practitioner

## 2011-03-09 NOTE — Telephone Encounter (Signed)
Received Med Rec Request from [employer] today, placed on [employer] desk today to be picked up this Tuesday

## 2011-03-10 NOTE — Assessment & Plan Note (Signed)
Likely a 'perfect storm' of circumstances- dehydration due to heat, overuse due to power washing and prolonged grip, possible hypokalemia due to dehydration.  Check labs to r/o electrolyte disturbance, anemia, thyroid dysfxn.  Encouraged increased fluid intake.  Will follow.  Pt expressed understanding and is in agreement w/ plan.

## 2011-03-10 NOTE — Assessment & Plan Note (Signed)
Will refer to hand specialist as pt is having very difficult time using hands.

## 2011-03-11 ENCOUNTER — Ambulatory Visit (INDEPENDENT_AMBULATORY_CARE_PROVIDER_SITE_OTHER): Payer: 59 | Admitting: Family Medicine

## 2011-03-11 ENCOUNTER — Encounter: Payer: Self-pay | Admitting: Family Medicine

## 2011-03-11 DIAGNOSIS — Z0000 Encounter for general adult medical examination without abnormal findings: Secondary | ICD-10-CM

## 2011-03-11 DIAGNOSIS — H539 Unspecified visual disturbance: Secondary | ICD-10-CM

## 2011-03-11 NOTE — Progress Notes (Signed)
  Subjective:    Patient ID: [name] [name], male    DOB: September 20, 1950, 60 y.o.   MRN: [id number]  HPI CPE- has appt w/ Dr Isabel Caprice next week for PSA, testicular, and rectal exam.  UTD on colonoscopy  Temporary visual loss- occuring frequently, 'not every day- but often'.  Will last for ~60 seconds, usually in R eye, denies pain, vision 'will just go away'.  Will have associated dizziness.  Will occur w/ sitting or standing- not related to position.  Has had normal eye exam.  Feels it is related to nerve compression in neck.  sxs have been present for 'years'.  'it's not on the top of my list but it's worrisome'.  Has discussed this previously w/ Neurosurgeon (Dr Yetta Barre) and was told there was nothing to do about it (2006).   Review of Systems Patient reports no hearing changes, anorexia, fever ,adenopathy, persistant/recurrent hoarseness, swallowing issues, chest pain, palpitations, edema, persistant/recurrent cough, hemoptysis, dyspnea (rest,exertional, paroxysmal nocturnal), gastrointestinal  bleeding (melena, rectal bleeding), abdominal pain, excessive heart burn, GU symptoms (dysuria, hematuria, voiding/incontinence issues) syncopy, memory loss, numbness & tingling, skin/hair/nail changes, depression, anxiety, abnormal bruising/bleeding, musculoskeletal symptoms/signs.     Objective:   Physical Exam  Vitals reviewed. Constitutional: He is oriented to person, place, and time. He appears well-developed and well-nourished. No distress.  HENT:  Head: Normocephalic and atraumatic.  Mouth/Throat: Oropharynx is clear and moist. No oropharyngeal exudate.       TMs normal bilaterally  Eyes: Conjunctivae and EOM are normal. Pupils are equal, round, and reactive to light.  Neck: Normal range of motion. Neck supple. No thyromegaly present.  Cardiovascular: Normal rate, regular rhythm and intact distal pulses.   Murmur (III/VI blowing SEM, heard best at RUSB) heard. Pulmonary/Chest: Effort normal and  breath sounds normal. No respiratory distress. He has no wheezes. He has no rales.  Abdominal: Soft. Bowel sounds are normal. He exhibits no distension. There is no tenderness. There is no rebound.  Genitourinary:       Deferred to urology  Musculoskeletal: He exhibits no edema and no tenderness.       Little use of L arm due to brachial nerve palsy  Lymphadenopathy:    He has no cervical adenopathy.  Neurological: He is alert and oriented to person, place, and time. He has normal reflexes. No cranial nerve deficit.  Skin: Skin is warm and dry.  Psychiatric: He has a normal mood and affect. His behavior is normal. Judgment and thought content normal.          Assessment & Plan:

## 2011-03-11 NOTE — Patient Instructions (Signed)
We'll call you with your neuro appt Your labs all looked great!  Keep up the good work! Call with any questions or concerns Enjoy the rest of your summer!

## 2011-03-12 NOTE — Telephone Encounter (Signed)
Labs only

## 2011-03-15 NOTE — Assessment & Plan Note (Signed)
Pt's PE WNL w/ exception of known brachial plexus palsy.  Labs reviewed from last visit and cardiology did lipids and EKG.  UTD on health maintenance.  Anticipatory guidance provided.

## 2011-03-15 NOTE — Assessment & Plan Note (Signed)
Pt reports he has had these sxs for years and has mentioned them previously to a neurologist.  Has seen his eye doctor recently who told him his eyes were fine.  i have concerns given his cardiac hx and his unexplained neuropathy/palsy that this is neurologic.  He is open to the idea of a 2nd opinion.  Will refer.

## 2011-03-30 ENCOUNTER — Ambulatory Visit (INDEPENDENT_AMBULATORY_CARE_PROVIDER_SITE_OTHER): Payer: 59 | Admitting: Neurology

## 2011-03-30 ENCOUNTER — Encounter: Payer: Self-pay | Admitting: Neurology

## 2011-03-30 DIAGNOSIS — H53129 Transient visual loss, unspecified eye: Secondary | ICD-10-CM

## 2011-03-30 DIAGNOSIS — G5610 Other lesions of median nerve, unspecified upper limb: Secondary | ICD-10-CM

## 2011-03-30 DIAGNOSIS — R42 Dizziness and giddiness: Secondary | ICD-10-CM

## 2011-03-30 NOTE — Patient Instructions (Signed)
You have been scheduled for your MRI and MRA's at [facility] on Tuesday, Sept. 4th at 1:00pm.  Please arrive by 12:45pm.

## 2011-03-30 NOTE — Progress Notes (Signed)
Dear Dr. Beverely Low,  Thank you for having [place] see [name] [name] in consultation today for problems with transient vision loss. He is also having problems with paroxysmal vertigo. Finally he endorses problems with worse right hand weakness. As you may recall he is a 60 year old right-hand dominant male with history of idiopathic left brachial plexopathy who has had approximately five-year history of "dizzy spells". These are described as vertiginous with the room spinning around him. He does not endorse any precipitating factors. It does not appear to happen when he is lying down. It may be brought on by moving his head too quickly but he is unsure of this. There is no change in hearing or no tinnitus.  He spells occur approximately one to 2 times per week.  In addition he has spells of visual loss in his left eye.  This can go completely "blank". He does endorse it being like a shade over his eye. There is no accompanying eye pain or headache. He can't cannot endorse precipitating events such as reading, changing gaze direction or positional changes. These spells only occur once per month.  The patient does not have a history of significant head trauma.  He is also concerned today at work he seems to be progressive weakness of his right hand. He does have a history of a carpal tunnel release which relieved shooting pain in his right forearm but did not help with a progressive weakness.  Medical history significant for an idiopathic brachial plexopathy in 2006 involving his left shoulder. This was painless. No obvious cause was found and he was ever treated with steroids.  He also has a history of an aortic valve replacement for aortic insufficiency. He suffers from hypertension and GERD.  Surgical history is significant for his aortic valve replacement with his pulmonary valve was switched with his aortic valve and a human valve was transplanted. In addition he has had a hiatal hernia repair.  Social  history he is not working at this point in time. 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  Family history not significant for compression neuropathies or plexopathies.  Examination: Filed Vitals:   03/30/11 1518  BP: 120/78  Pulse: 68   In general he is a well-appearing 60 year old man in acute distress. Head and neck is supple without lymphadenopathy. Chest is clear to auscultation bilaterally. Cardiovascular system reveals no carotid bruits. His regular rate and rhythm although has a very loud S2 and loud systolic murmur. Skin doesn't reveal any rashes. Extremities do not reveal any edema.  Neurologically he is oriented x4 his language is intact. Pupils are equally round react to light. I do not detect an APD. There is no dysarthria or dysphasia. Extraocular movements are full without nystagmus. Facial sensory loss of facial expression are normal. Tongue and palate midline. Shoulder shrug intact.  Motor examination reveals obvious significant shoulder or wasting on the left. He is 1-2/5 motor strength in shoulder abduction as well as 2-3 in arm flexion on the left. His distal strength is normal. On the right he appears to be full strength except in thumb abduction.  There is no significant wasting there though.  There is some slight weakness also of his right pronator teres as well.  His lower extremities are of normal strength.  Reflexes are 2+ at the right triceps 1+ at the left triceps and absent in the rest of the upper extremities. They're also quiet in the lower extremities.  Coordination reveals normal finger to  nose testing on the right although it is imperative the left because of his weakness.  Casual gait and station are normal. Tandem gait is intact Romberg is negative.  Impression: [name] [name] is a 60 year old man with a history of an idiopathic brachial plexopathy who has had chronic short paroxysms of  vertigo as well as transient right eye visual loss. Not noted above  however is the fact he has had a normal ophthalmologic examination. The only diagnosis that I can see that could link vertigo to the transient episodes of vision loss would be from cerebral vascular disease. I think it would be most likely from a low flow state secondary to stenosis rather than embolic disease.  I am going to take the liberty of getting an MRI of his brain with an MRA of his head and neck to rule out cerebrovascular disease. However I am most worried about he is right hand weakness particularly since there maybe some subtle proximal weakness. I suspect it is all from carpal tunnel syndrome but I would like to get a complete nerve conduction study and electromyography of his right arm in order to rule out a more proximal lesion.  After these results are available we will followup with [name] [name]  Thank you for having [place] see her in consultation.  Feel free to contact me with any questions.  Lupita Raider Modesto Charon, MD [facility], [place] [address] [place], [place] [address] Phone: [phone] Fax: [phone].

## 2011-03-31 ENCOUNTER — Telehealth: Payer: Self-pay

## 2011-03-31 NOTE — Telephone Encounter (Signed)
Pt informed of his appt at [facility] on 9/17 at 9:00am

## 2011-04-07 ENCOUNTER — Ambulatory Visit (HOSPITAL_COMMUNITY): Admission: RE | Admit: 2011-04-07 | Payer: 59 | Source: Ambulatory Visit

## 2011-04-07 ENCOUNTER — Ambulatory Visit (HOSPITAL_COMMUNITY)
Admission: RE | Admit: 2011-04-07 | Discharge: 2011-04-07 | Disposition: A | Discharge status: Home / Self Care | Payer: 59 | Source: Ambulatory Visit | Attending: Neurology | Admitting: Neurology

## 2011-04-07 DIAGNOSIS — I679 Cerebrovascular disease, unspecified: Secondary | ICD-10-CM | POA: Insufficient documentation

## 2011-04-07 DIAGNOSIS — G5610 Other lesions of median nerve, unspecified upper limb: Secondary | ICD-10-CM

## 2011-04-07 DIAGNOSIS — M4802 Spinal stenosis, cervical region: Secondary | ICD-10-CM | POA: Insufficient documentation

## 2011-04-07 DIAGNOSIS — M5090 Cervical disc disorder, unspecified, unspecified cervical region: Secondary | ICD-10-CM | POA: Insufficient documentation

## 2011-04-07 DIAGNOSIS — R42 Dizziness and giddiness: Secondary | ICD-10-CM | POA: Insufficient documentation

## 2011-04-07 DIAGNOSIS — H53129 Transient visual loss, unspecified eye: Secondary | ICD-10-CM | POA: Insufficient documentation

## 2011-04-07 LAB — BUN: BUN: 15 mg/dL (ref 6–23)

## 2011-04-07 MED ORDER — GADOBENATE DIMEGLUMINE 529 MG/ML IV SOLN
20.0000 mL | Freq: Once | INTRAVENOUS | Status: AC
  Administered 2011-04-07: 20 mL via INTRAVENOUS

## 2011-04-07 NOTE — Progress Notes (Signed)
Addended by: Lelon Huh on: 04/07/2011 11:10 AM   Modules accepted: Orders

## 2011-04-13 ENCOUNTER — Telehealth: Payer: Self-pay

## 2011-04-13 NOTE — Telephone Encounter (Signed)
Pt set up for EEG on 04/16/11 at 1:00pm at [facility].  Pt notified of appt.

## 2011-04-16 ENCOUNTER — Ambulatory Visit (HOSPITAL_COMMUNITY)
Admission: RE | Admit: 2011-04-16 | Discharge: 2011-04-16 | Disposition: A | Discharge status: Home / Self Care | Payer: 59 | Source: Ambulatory Visit | Attending: Neurology | Admitting: Neurology

## 2011-04-16 DIAGNOSIS — Z1389 Encounter for screening for other disorder: Secondary | ICD-10-CM | POA: Insufficient documentation

## 2011-04-16 DIAGNOSIS — R42 Dizziness and giddiness: Secondary | ICD-10-CM | POA: Insufficient documentation

## 2011-04-16 DIAGNOSIS — R569 Unspecified convulsions: Secondary | ICD-10-CM

## 2011-04-17 NOTE — Procedures (Signed)
EEG NUMBER:  [id number]  This routine EEG is requested in this 60 year old man with a history of spells of dizziness.  The purpose of this EEG is to determine if he has any interictal epileptiform abnormalities.  He is on no anticonvulsant medications.  The EEG was done with the patient awake and drowsy.  During periods of maximal wakefulness, he had a moderately regulated and poorly sustained 11-12 cycle per second posterior dominant rhythm that attenuated with eye opening and was symmetric.  Background activities were composed mainly of alpha and diffuse frontally dominant beta activities that were symmetric.  Photic stimulation produced a symmetric driving response. Hypoventilation for 3 minutes with good effort did not markedly change the tracing.  The patient became drowsy as evidenced by a burst of theta activity.  Note was made of  rhythmic midtemporal theta drowsiness that was bilateral, but asynchronous.  The patient did not enter stage II sleep.  CLINICAL INTERPRETATION:  This routine EEG done with a patient awake and drowsy is normal.          ______________________________ Denton Meek, MD    [id number] D:  04/16/2011 23:19:44  T:  04/17/2011 07:30:03  Job #:  [id number]

## 2011-04-20 ENCOUNTER — Other Ambulatory Visit: Payer: Self-pay | Admitting: Cardiology

## 2011-04-20 NOTE — Telephone Encounter (Signed)
Refilled Meds from fax  

## 2011-04-28 ENCOUNTER — Ambulatory Visit (INDEPENDENT_AMBULATORY_CARE_PROVIDER_SITE_OTHER): Payer: 59 | Admitting: Neurology

## 2011-04-28 ENCOUNTER — Encounter: Payer: Self-pay | Admitting: Neurology

## 2011-04-28 DIAGNOSIS — M5412 Radiculopathy, cervical region: Secondary | ICD-10-CM

## 2011-04-28 DIAGNOSIS — D1800 Hemangioma unspecified site: Secondary | ICD-10-CM

## 2011-04-28 DIAGNOSIS — Q825 Congenital non-neoplastic nevus: Secondary | ICD-10-CM

## 2011-04-28 NOTE — Patient Instructions (Signed)
Your MRI is scheduled for Monday, October 1st at 4:00pm.  Please arrive to [facility] by 3:45pm  We will call you with your appointment to [facility].

## 2011-04-28 NOTE — Progress Notes (Signed)
Dear Dr. Beverely Low,  I saw [name] [name] back in clinic today for follow up.  As you know you sent him to me for consultation on episodes of transient visual loss in his right eye, as well as spells of transient vertigo.  In addition he has has a history of an idiopathic left brachial plexopathy which has left him without the use of his left arm.  He also described that he was having problems with the grip strength in is right hand.  I got an MRI of his brain and MRA of his head and neck.  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  The MRA was unremarkable.  I also got an EEG with the theory that these visual loss spells are possibly seizures which was unremarkable.  Finally in order to address the right hand weakness I got an EMG which revealed a chronic C8 radiculopathy.  PMhx, SocHx, FamHx and Medications unchanged from last visit.  Examination unchanged from last visit and remarkable for severe weakness left shoulder such that patient cannot properly abduct arm.  Also has grip strength weakness in right with abnormality of thumb abduction and opposition.  Impression: 1.  Spells of vertigo and vision loss.  These are separate spell types.  The visual loss spells are rare, but the vertigo spells are less so.  I think it is possible that these may represent a sensory seizure arising from his cavernoma.  I have offered him a trial of an AED(Keppra) to see if this helps, but he wants to wait on this which I think is fine. 2.  Right grip weakness - We are going to get an MRI C-spine for [name] [name] in an open MRI.  If there is a nerve root impingement then we can consider surgical referal. 3.  ?Cavernoma - I would favor watchful waiting.  However, I am going to have Dr. Mikal Plane see the patient to get his opinion.  Of the 30 minute appointment over 15 minutes was spent in counseling the patient.  We will see the patient back in 4  weeks.  Lupita Raider Modesto Charon, MD [facility], [place]

## 2011-04-29 ENCOUNTER — Telehealth: Payer: Self-pay

## 2011-04-29 NOTE — Telephone Encounter (Signed)
Called pt and informed him of his upcoming appts at [facility] 05/03/11 at 3:00pm for C spine MRI and with Dr. Franky Macho on 10/15 at 9:30am.

## 2011-05-01 ENCOUNTER — Encounter: Payer: Self-pay | Admitting: Neurology

## 2011-05-01 ENCOUNTER — Inpatient Hospital Stay: Admission: RE | Admit: 2011-05-01 | Payer: 59 | Source: Ambulatory Visit

## 2011-05-03 ENCOUNTER — Ambulatory Visit
Admission: RE | Admit: 2011-05-03 | Discharge: 2011-05-03 | Disposition: A | Discharge status: Home / Self Care | Payer: 59 | Source: Ambulatory Visit | Attending: Neurology | Admitting: Neurology

## 2011-05-03 DIAGNOSIS — M5412 Radiculopathy, cervical region: Secondary | ICD-10-CM

## 2011-05-04 ENCOUNTER — Other Ambulatory Visit (HOSPITAL_COMMUNITY): Payer: 59

## 2011-05-11 ENCOUNTER — Encounter: Payer: Self-pay | Admitting: Neurology

## 2011-05-22 ENCOUNTER — Other Ambulatory Visit: Payer: Self-pay | Admitting: Neurosurgery

## 2011-05-22 DIAGNOSIS — M47812 Spondylosis without myelopathy or radiculopathy, cervical region: Secondary | ICD-10-CM

## 2011-05-22 DIAGNOSIS — M5412 Radiculopathy, cervical region: Secondary | ICD-10-CM

## 2011-05-28 ENCOUNTER — Ambulatory Visit
Admission: RE | Admit: 2011-05-28 | Discharge: 2011-05-28 | Disposition: A | Discharge status: Home / Self Care | Payer: 59 | Source: Ambulatory Visit | Attending: Neurosurgery | Admitting: Neurosurgery

## 2011-05-28 DIAGNOSIS — M5412 Radiculopathy, cervical region: Secondary | ICD-10-CM

## 2011-05-28 DIAGNOSIS — M47812 Spondylosis without myelopathy or radiculopathy, cervical region: Secondary | ICD-10-CM

## 2011-06-01 ENCOUNTER — Other Ambulatory Visit: Payer: Self-pay | Admitting: Cardiology

## 2011-06-08 ENCOUNTER — Encounter: Payer: Self-pay | Admitting: Neurology

## 2011-06-08 ENCOUNTER — Ambulatory Visit (INDEPENDENT_AMBULATORY_CARE_PROVIDER_SITE_OTHER): Payer: 59 | Admitting: Neurology

## 2011-06-08 VITALS — BP 110/70 | HR 68 | Wt 234.0 lb

## 2011-06-08 DIAGNOSIS — M5412 Radiculopathy, cervical region: Secondary | ICD-10-CM

## 2011-06-08 NOTE — Progress Notes (Signed)
Dear Dr. Beverely Low,  I saw [name] back in clinic today for follow up. As you know you sent him to me for consultation on episodes of transient visual loss in his right eye, as well as spells of transient vertigo. In addition he has has a history of an idiopathic left brachial plexopathy which has left him without the use of his left arm. He also described that he was having problems with the grip strength in is right hand.   I got an MRI of his brain and MRA of his head and neck. 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 The MRA was unremarkable. I also got an EEG with the theory that these visual loss spells are possibly seizures which was unremarkable. Finally in order to address the right hand weakness I got an EMG which revealed a chronic C8 radiculopathy.  A subsequent MRI C-spine revealed significant degenerative spine disease affecting most prominently the C5-C6 and C6-C7 levels with foraminal stenosis at both levels.  I sent him to Dr. Barbaraann Barthel for evaluation of his cavernoma as well as his C-spine disease.  Unfortunately his consultation is not available to me.  By report however,  Dr. Mikal Plane felt that an intervention of the spine targetting C5-C7 was an option, presumedly a laminectomy and fusion.  He was not concerned about the occipital cavernoma but did want to follow it up in 6 months with a repeat MRI.  The patient reports no worsening in his right hand weakness.  He has had two short spells of visual loss in the right eye(which I believe to be the right hemifield) since I saw him last.  He is still not interested in treatment of these spells or the vertiginous spells.   Medical history, social history, family history, medications and allergies were reviewed and have not changed since the last clinic vist.  ROS:  13 systems were reviewed and are notable for chronic left arm weakness with from idiopathic brachial  plexopathy.  Mild intermittent neck pain. All other review of systems are unremarkable.  Exam: . Filed Vitals:   06/08/11 1100  BP: 110/70  Pulse: 68  Weight: 234 lb (106.142 kg)    In general, well appearing older man in no acute distress.  Mental status:   The patient is oriented to person, place and time. Recent and remote memory are intact. Attention span and concentration are normal. Language including repetition, naming, following commands are intact. Fund of knowledge of current and historical events, as well as vocabulary are normal.   Motor:  Perhaps mild right wrist extensor weakness.  Also weakness of finger abduction, thumb abduction, opposition(multiple C8 muscles) on the right.  Weak proximal muscles left arm, with intact hand intrinsics.  Reflexes:  Absent brachioradialis, biceps bilaterally.  Intact triceps.   Gait:  Normal gait and station.   Impression/Recs 1.  Spells of transient vision loss, dizziness.  I think these are likely sensory seizures, but he does not want to pursue treatment at this time.  I have told him if they worsen in frequency he should contact me.  At this time, I don't think he needs to stop driving, but if they start to obscure his consciousness then he will have to stop and consider treatment. 2.  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  I don't think it needs regular follow up but we will leave that decision to Dr.  Cabell. 3.  Right sided multilevel cervical radiculopathies - likely causing his intrinsic hand weakness and possible wrist extension weakness on the right.  He is going to decide whether he wants to pursue surgery at this point in time or wait.  I am not able to comment on either course, other than to say that if he waits, a deterioration in strength would likely be slowly progressive with worsening of his spine disease. 4.  Idiopathic left brachial plexopathy - stable.   I will see [name] [name]  back on an as needed  basis.  He is to call me if his dizziness or transient vision loss gets worse.  Lupita Raider Modesto Charon, MD [facility], [place]

## 2011-06-12 ENCOUNTER — Ambulatory Visit: Payer: 59 | Admitting: Neurology

## 2011-07-07 ENCOUNTER — Ambulatory Visit (INDEPENDENT_AMBULATORY_CARE_PROVIDER_SITE_OTHER): Payer: 59 | Admitting: *Deleted

## 2011-07-07 DIAGNOSIS — Z23 Encounter for immunization: Secondary | ICD-10-CM

## 2011-07-20 ENCOUNTER — Ambulatory Visit (INDEPENDENT_AMBULATORY_CARE_PROVIDER_SITE_OTHER): Payer: 59 | Admitting: Cardiology

## 2011-07-20 ENCOUNTER — Encounter: Payer: Self-pay | Admitting: Cardiology

## 2011-07-20 VITALS — BP 128/86 | HR 68 | Ht 76.0 in | Wt 237.0 lb

## 2011-07-20 DIAGNOSIS — I359 Nonrheumatic aortic valve disorder, unspecified: Secondary | ICD-10-CM

## 2011-07-20 DIAGNOSIS — I10 Essential (primary) hypertension: Secondary | ICD-10-CM

## 2011-07-20 DIAGNOSIS — Z954 Presence of other heart-valve replacement: Secondary | ICD-10-CM

## 2011-07-20 NOTE — Progress Notes (Signed)
PCP: Dr. Beverely Low  60 yo with history of aortic insufficiency s/p Ross procedure presents for cardiology followup.  On last echo in 4/11, the pulmonic valve in the aortic positive had mild insufficiency and the ascending aorta was upper normal in size.  Patient has been stable medically.  He denies any chest pain episodes.  He is winded after walking up a flight of steps but he walks for exercise and golfs with no problems.  He does all his yardwork.  He has a left-sided brachial plexopathy of uncertain etiology and has atrophy of his left shoulder and upper arm.  He has developed weakness in his right hand as well as occasional vertigo-type spells.  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    BP has been doing well recently.   PMH: 1. Aortic insufficiency: s/p Ross procedure in [place] in [place].  Echo (4/11) with hypokinetic basal septum (likely post-surgical), EF 50%, mild LVH, s/p Ross procedure with native pulmonic valve in aortic position with mild aortic insufficiency and bioprosthetic pulmonic valve with no pulmonic insufficiency, mild MR, aortic upper normal in size.  2. Post-operative atrial fibrillation after heart surgery.  3. Normal left heart cath prior to 1994 heart surgery.  4. Left brachial plexopathy with left shoulder muscle atrophy.  Uncertain etiology.  5. BPH 6. Hyperlipidemia.  7. Cervical spinal stenosis with radiculopathy.   8. 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   FH: Prostate CA, HTN  SH: Lives in [place], works part time in a window washing business, married, nonsmoker.    Current Outpatient Prescriptions  Medication Sig  Dispense Refill  . aspirin 81 MG chewable tablet Chew 81 mg by mouth daily.        Marland Kitchen atenolol (TENORMIN) 25 MG tablet Take 1 tablet (25 mg total) by mouth daily.  30 tablet  11  . finasteride (PROSCAR) 5 MG tablet Take 5 mg by mouth daily.        Marland Kitchen MICARDIS 80 MG tablet TAKE ONE (1) TABLET(S) ONCE DAILY  30 tablet  4  . simvastatin (ZOCOR) 20 MG tablet TAKE ONE (1) TABLET(S) EACH EVENING BY MOUTH  30 tablet  3    BP 128/86  Pulse 68  Ht 6\' 4"  (1.93 m)  Wt 107.502 kg (237 lb)  BMI 28.85 kg/m2 General: NAD, overweight Neck: No JVD, no thyromegaly or thyroid nodule.  Lungs: Clear to auscultation bilaterally with normal respiratory effort. CV: Nondisplaced PMI.  Heart regular S1/S2, widely split S2, no S3/S4, 2/6 SEM radiating to the neck.  No peripheral edema.  Normal pedal pulses.  Abdomen: Soft, nontender, no hepatosplenomegaly, no distention.  Neurologic: Alert and oriented x 3.  Psych: Normal affect. Extremities: No clubbing or cyanosis.  MSK: Atrophy of left shoulder muscles

## 2011-07-20 NOTE — Assessment & Plan Note (Signed)
BP has been under good control recently.

## 2011-07-20 NOTE — Patient Instructions (Signed)
Your physician has requested that you have an echocardiogram. Echocardiography is a painless test that uses sound waves to create images of your heart. It provides your doctor with information about the size and shape of your heart and how well your heart's chambers and valves are working. This procedure takes approximately one hour. There are no restrictions for this procedure.  April 2013   Your physician wants you to follow-up in: 6 months with Dr Shirlee Latch.(June 2013). You will receive a reminder letter in the mail two months in advance. If you don't receive a letter, please call our office to schedule the follow-up appointment.

## 2011-07-20 NOTE — Assessment & Plan Note (Signed)
Status post Ross procedure with native pulmonic valve transposed to the aortic position and a bioprosthetic pulmonic valve.  Last echo showed mild aortic insufficiency.  The ascending aorta was mildly dilated.  One of the risks with the Ross procedure is development of dilated aortic root/ascending aorta.   - Repeat echocardiogram in 4/13 - Keep BP controlled to limit tendency to dilate ascending aorta.

## 2011-08-25 ENCOUNTER — Other Ambulatory Visit: Payer: Self-pay | Admitting: Cardiology

## 2011-08-25 MED ORDER — SIMVASTATIN 20 MG PO TABS: 20.0000 mg | ORAL_TABLET | Freq: Every day | ORAL | Status: DC

## 2011-08-25 NOTE — Telephone Encounter (Signed)
Per pt he is having trouble getting his simvastatin and needs refill sent to [facility] in [place] on [address]

## 2011-10-02 ENCOUNTER — Telehealth: Payer: Self-pay | Admitting: *Deleted

## 2011-10-02 NOTE — Telephone Encounter (Signed)
Called pt to offer an apt for this afternoon to complete the social security disability formpt accepted apt for Wed 10-07-11 at 2pm, MD Tabori aware

## 2011-10-07 ENCOUNTER — Ambulatory Visit (INDEPENDENT_AMBULATORY_CARE_PROVIDER_SITE_OTHER): Payer: 59 | Admitting: Family Medicine

## 2011-10-07 ENCOUNTER — Encounter: Payer: Self-pay | Admitting: Family Medicine

## 2011-10-07 VITALS — BP 125/70 | HR 78 | Temp 98.1°F | Ht 74.0 in | Wt 239.0 lb

## 2011-10-07 DIAGNOSIS — I10 Essential (primary) hypertension: Secondary | ICD-10-CM

## 2011-10-07 DIAGNOSIS — M5090 Cervical disc disorder, unspecified, unspecified cervical region: Secondary | ICD-10-CM | POA: Insufficient documentation

## 2011-10-07 DIAGNOSIS — G540 Brachial plexus disorders: Secondary | ICD-10-CM | POA: Insufficient documentation

## 2011-10-07 DIAGNOSIS — E785 Hyperlipidemia, unspecified: Secondary | ICD-10-CM

## 2011-10-07 NOTE — Progress Notes (Signed)
  Subjective:    Patient ID: [name] [name], male    DOB: 05/24/1951, 61 y.o.   MRN: [id number]  HPI Here today for Social Security Disability Evaluation.  Has disc issues at C5-6, C6-7 on R side- following w/ Dr Franky Macho (Neurosurg).  Has neuropathy on L.   Review of Systems For ROS see HPI     Objective:   Physical Exam As documented in disability evaluation       Assessment & Plan:

## 2011-10-07 NOTE — Patient Instructions (Signed)
Schedule your complete physical in August Keep up the good work- you look great! We'll fax the paper work Call with any questions or concerns Sherri Rad in there!!!

## 2011-10-08 LAB — LIPID PANEL
Cholesterol: 136 mg/dL (ref 0–200)
HDL: 33.4 mg/dL — ABNORMAL LOW
Total CHOL/HDL Ratio: 4
Triglycerides: 238 mg/dL — ABNORMAL HIGH (ref 0.0–149.0)
VLDL: 47.6 mg/dL — ABNORMAL HIGH (ref 0.0–40.0)

## 2011-10-08 LAB — HEPATIC FUNCTION PANEL
ALT: 25 U/L (ref 0–53)
AST: 23 U/L (ref 0–37)
Albumin: 4.2 g/dL (ref 3.5–5.2)
Alkaline Phosphatase: 57 U/L (ref 39–117)
Total Protein: 6.8 g/dL (ref 6.0–8.3)

## 2011-10-08 LAB — BASIC METABOLIC PANEL
CO2: 24 mEq/L (ref 19–32)
Chloride: 107 mEq/L (ref 96–112)
GFR: 74.32 mL/min (ref >60.00)
Glucose, Bld: 104 mg/dL — ABNORMAL HIGH (ref 70–99)
Potassium: 4.2 mEq/L (ref 3.5–5.1)
Sodium: 140 mEq/L (ref 135–145)

## 2011-10-08 LAB — LDL CHOLESTEROL, DIRECT: Direct LDL: 83.6 mg/dL

## 2011-10-20 ENCOUNTER — Telehealth: Payer: Self-pay | Admitting: Family Medicine

## 2011-10-20 NOTE — Telephone Encounter (Signed)
Received 4 pages. Sent to Dr. Modesto Charon. SD 10/19/11.

## 2011-10-25 NOTE — Assessment & Plan Note (Signed)
Labs collected for upcoming CPE 

## 2011-10-25 NOTE — Assessment & Plan Note (Signed)
New.  Following w/ Dr Mikal Plane (neurosurg).  Applying for disability as cervical disc dz is impairing his R side and his brachial plexus neuropathy impacts the L.

## 2011-10-25 NOTE — Assessment & Plan Note (Signed)
L sided, severe.  Following w/ neuro and neurosurgery.  Forms completed for disability.  Total time spent w/ pt- 30 minutes, >50% spent counseling and completing forms w/ pt present in exam room.

## 2011-10-27 ENCOUNTER — Telehealth: Payer: Self-pay | Admitting: Neurology

## 2011-10-27 ENCOUNTER — Other Ambulatory Visit: Payer: Self-pay | Admitting: Cardiology

## 2011-10-27 NOTE — Telephone Encounter (Signed)
Received copies from [employer] ,on 10/27/11. Forwarded 4 pages to Dr.Wong ,for review.

## 2011-11-02 ENCOUNTER — Encounter: Payer: Self-pay | Admitting: Neurology

## 2011-11-02 ENCOUNTER — Ambulatory Visit (INDEPENDENT_AMBULATORY_CARE_PROVIDER_SITE_OTHER): Payer: 59 | Admitting: Neurology

## 2011-11-02 ENCOUNTER — Telehealth: Payer: Self-pay | Admitting: Neurology

## 2011-11-02 VITALS — BP 116/76 | HR 72 | Wt 240.0 lb

## 2011-11-02 DIAGNOSIS — G540 Brachial plexus disorders: Secondary | ICD-10-CM

## 2011-11-02 NOTE — Telephone Encounter (Signed)
Message copied by Benay Spice on Mon Nov 02, 2011  8:29 AM ------      Message from: Milas Gain      Created: Sun Nov 01, 2011 10:17 AM       Hi Jan - Could you call patient and let him know that I got some disability paperwork from [employer].  They are asking me to fill out a form that focuses on the symptom of vertigo or dizziness with the diagnosis of Meniere's disease.  I know he has the episodes of dizziness, but I don't think they are Meniere's disease.  Perhaps he should come for a re-appt to discuss the paperwork.

## 2011-11-02 NOTE — Telephone Encounter (Signed)
Called and spoke with the patient. Information given as directed by Dr. Modesto Charon below. The patient was scheduled for f/u on 11/05/11 at 11:30 am.

## 2011-11-02 NOTE — Progress Notes (Signed)
Dear Dr. Beverely Low,   I saw [name] back in clinic today for follow up. As you know you sent him to me for consultation on episodes of transient visual loss in his right eye, as well as spells of transient vertigo. In addition he has has a history of an idiopathic left brachial plexopathy which has left him without the use of his left arm. He also described that he was having problems with the grip strength in is right hand.   I got an MRI of his brain and MRA of his head and neck. 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 The MRA was unremarkable. I also got an EEG with the theory that these visual loss spells are possibly seizures.  The EEG was normal.. Finally in order to address the right hand weakness I got an EMG which revealed a chronic C8 radiculopathy. A subsequent MRI C-spine revealed significant degenerative spine disease affecting most prominently the C5-C6 and C6-C7 levels with foraminal stenosis at both levels.   I sent him to Dr. Barbaraann Barthel for evaluation of his cavernoma as well as his C-spine disease.  By report however, Dr. Mikal Plane felt that an intervention of the spine targetting C5-C7 was an option, presumedly a laminectomy and fusion. He was not concerned about the occipital cavernoma but did want to follow it up in 6 months with a repeat MRI.  Since I last saw him he continues to have relatively frequent dizzy spells - where the world is visually flipped on its side.  They last less than a minute can come on without provocation or change in head movement.  These happen a couple of times per week.  In addition he has rarer spells of right monocular vision loss -- I at first felt these were loss of his right visual field - but today he says he has tried to cover his eye and has characterized it as monocular.  He never has had a history of migraines.  He is continued to be followed by Dr. Mikal Plane.  His grip strength  measured quantitatively has not changed.   Medical history, social history, and family history were reviewed and have not changed since the last clinic visit.  Current Outpatient Prescriptions on File Prior to Visit  Medication Sig Dispense Refill  . aspirin 81 MG chewable tablet Chew 81 mg by mouth daily.        Marland Kitchen atenolol (TENORMIN) 25 MG tablet Take 1 tablet (25 mg total) by mouth daily.  30 tablet  11  . finasteride (PROSCAR) 5 MG tablet Take 5 mg by mouth daily.        Marland Kitchen MICARDIS 80 MG tablet TAKE ONE (1) TABLET ONCE DAILY  30 tablet  3  . simvastatin (ZOCOR) 20 MG tablet Take 1 tablet (20 mg total) by mouth at bedtime.  30 tablet  3  . VIAGRA 100 MG tablet         Allergies  Allergen Reactions  . Penicillins     ROS:  13 systems were reviewed and are  unremarkable.  Exam: . Filed Vitals:   11/02/11 1528  BP: 116/76  Pulse: 72  Weight: 240 lb (108.863 kg)    In general, well appearing man.  Mental status:   The patient is oriented to person, place and time. Recent and remote memory are intact. Attention span and concentration are normal. Language including repetition, naming, following commands  are intact. Fund of knowledge of current and historical events, as well as vocabulary are normal.  Cranial Nerves: Pupils are equally round and reactive to light. Visual fields full to confrontation. Extraocular movements are intact without nystagmus. Facial sensation and muscles of mastication are intact. Muscles of facial expression are symmetric. Hearing intact to bilateral finger rub. Tongue protrusion, uvula, palate midline.  Shoulder shrug delayed on left.  Motor:  Decreased bulk in left shoulder.  Some weakness of right wrist extensor as well as finger abduction, thumb abduction and opposition on the right.  Left reveals severely weak shoulder girdle muscles with intact triceps, but 4-/5 biceps and normal wrist and hand muscles.  Reflexes:  Absent brachioradialis, biceps  bilaterally.  Intact triceps.   Gait:  Normal gait and station.  Romberg negative.  Impression/Recommendations:  1.  Right intrinsic hand weakness secondary to radiculopathy - stable.  Patient is going to wait on surgery unless he worsens. 2.  Left arm weakness/brachioplexopathy - no change. 3.  Spells of TMVL right eye - unlikely to be ischemic given dozens of occurences.  Patient does not want to try a medication and frankly given they are monocular I don't think they represent seizures(perhaps ophthalmic migraine though). 4.  Spells of vertigo - these seem to be primarily a visual phenomenon which I think is likely due to partial seizure arising from his cavernoma, but again patient does not find these terribly bothersome, so would not like to pursue medication at this time.   5.  Disability - I think given the patient's intrinsic right hand weakness and lack of use of his left arm qualifies for disability.  I will support him in applying for this in anyway I can.   [name] Raider Modesto Charon, MD [facility], [place]

## 2011-11-05 ENCOUNTER — Ambulatory Visit: Payer: 59 | Admitting: Neurology

## 2011-11-10 ENCOUNTER — Other Ambulatory Visit: Payer: Self-pay

## 2011-11-10 ENCOUNTER — Ambulatory Visit (HOSPITAL_COMMUNITY): Payer: 59 | Attending: Cardiovascular Disease

## 2011-11-10 DIAGNOSIS — I080 Rheumatic disorders of both mitral and aortic valves: Secondary | ICD-10-CM | POA: Insufficient documentation

## 2011-11-10 DIAGNOSIS — E669 Obesity, unspecified: Secondary | ICD-10-CM | POA: Insufficient documentation

## 2011-11-10 DIAGNOSIS — I359 Nonrheumatic aortic valve disorder, unspecified: Secondary | ICD-10-CM

## 2011-11-10 DIAGNOSIS — I517 Cardiomegaly: Secondary | ICD-10-CM | POA: Insufficient documentation

## 2011-11-10 DIAGNOSIS — I379 Nonrheumatic pulmonary valve disorder, unspecified: Secondary | ICD-10-CM

## 2011-11-10 DIAGNOSIS — R011 Cardiac murmur, unspecified: Secondary | ICD-10-CM | POA: Insufficient documentation

## 2011-11-10 DIAGNOSIS — Z954 Presence of other heart-valve replacement: Secondary | ICD-10-CM | POA: Insufficient documentation

## 2011-11-10 DIAGNOSIS — E785 Hyperlipidemia, unspecified: Secondary | ICD-10-CM | POA: Insufficient documentation

## 2011-11-10 DIAGNOSIS — I10 Essential (primary) hypertension: Secondary | ICD-10-CM | POA: Insufficient documentation

## 2011-11-27 ENCOUNTER — Telehealth: Payer: Self-pay | Admitting: Cardiology

## 2011-11-27 NOTE — Telephone Encounter (Signed)
New msg [employer] was calling about info they need for diability claim. Please return the call

## 2011-11-27 NOTE — Telephone Encounter (Signed)
Gave to [place] to call/ follow up.

## 2011-12-17 ENCOUNTER — Other Ambulatory Visit: Payer: Self-pay | Admitting: Cardiology

## 2012-01-08 ENCOUNTER — Other Ambulatory Visit: Payer: Self-pay | Admitting: Neurosurgery

## 2012-01-08 DIAGNOSIS — D1800 Hemangioma unspecified site: Secondary | ICD-10-CM

## 2012-01-18 ENCOUNTER — Other Ambulatory Visit: Payer: 59

## 2012-01-21 ENCOUNTER — Ambulatory Visit
Admission: RE | Admit: 2012-01-21 | Discharge: 2012-01-21 | Disposition: A | Discharge status: Home / Self Care | Payer: 59 | Source: Ambulatory Visit | Attending: Neurosurgery | Admitting: Neurosurgery

## 2012-01-21 DIAGNOSIS — D1800 Hemangioma unspecified site: Secondary | ICD-10-CM

## 2012-01-21 MED ORDER — GADOBENATE DIMEGLUMINE 529 MG/ML IV SOLN
20.0000 mL | Freq: Once | INTRAVENOUS | Status: AC | PRN
  Administered 2012-01-21: 20 mL via INTRAVENOUS

## 2012-02-15 ENCOUNTER — Other Ambulatory Visit: Payer: Self-pay | Admitting: Cardiology

## 2012-02-17 ENCOUNTER — Encounter: Payer: Self-pay | Admitting: Cardiology

## 2012-02-20 ENCOUNTER — Other Ambulatory Visit: Payer: Self-pay | Admitting: Cardiology

## 2012-03-08 ENCOUNTER — Ambulatory Visit (INDEPENDENT_AMBULATORY_CARE_PROVIDER_SITE_OTHER): Payer: 59 | Admitting: Family Medicine

## 2012-03-08 ENCOUNTER — Encounter: Payer: Self-pay | Admitting: Family Medicine

## 2012-03-08 VITALS — BP 116/71 | HR 62 | Temp 98.9°F | Ht 74.0 in | Wt 237.4 lb

## 2012-03-08 DIAGNOSIS — I10 Essential (primary) hypertension: Secondary | ICD-10-CM

## 2012-03-08 DIAGNOSIS — Z0000 Encounter for general adult medical examination without abnormal findings: Secondary | ICD-10-CM

## 2012-03-08 DIAGNOSIS — E785 Hyperlipidemia, unspecified: Secondary | ICD-10-CM

## 2012-03-08 LAB — BASIC METABOLIC PANEL
BUN: 16 mg/dL (ref 6–23)
Calcium: 9.3 mg/dL (ref 8.4–10.5)
Creatinine, Ser: 0.8 mg/dL (ref 0.4–1.5)

## 2012-03-08 LAB — LIPID PANEL
Cholesterol: 138 mg/dL (ref 0–200)
LDL Cholesterol: 75 mg/dL (ref 0–99)
Triglycerides: 132 mg/dL (ref 0.0–149.0)
VLDL: 26.4 mg/dL (ref 0.0–40.0)

## 2012-03-08 LAB — HEPATIC FUNCTION PANEL
Bilirubin, Direct: 0 mg/dL (ref 0.0–0.3)
Total Bilirubin: 1.1 mg/dL (ref 0.3–1.2)

## 2012-03-08 LAB — TSH: TSH: 2.05 u[IU]/mL (ref 0.35–5.50)

## 2012-03-08 LAB — CBC WITH DIFFERENTIAL/PLATELET
Basophils Absolute: 0 10*3/uL (ref 0.0–0.1)
Eosinophils Absolute: 0.1 10*3/uL (ref 0.0–0.7)
HCT: 43.6 % (ref 39.0–52.0)
Hemoglobin: 14.8 g/dL (ref 13.0–17.0)
Lymphocytes Relative: 25.5 % (ref 12.0–46.0)
Lymphs Abs: 1.8 10*3/uL (ref 0.7–4.0)
MCHC: 33.9 g/dL (ref 30.0–36.0)
MCV: 90.9 fl (ref 78.0–100.0)
Monocytes Absolute: 0.8 10*3/uL (ref 0.1–1.0)
Neutro Abs: 4.4 10*3/uL (ref 1.4–7.7)
RDW: 13 % (ref 11.5–14.6)

## 2012-03-08 NOTE — Progress Notes (Signed)
  Subjective:    Patient ID: [name] [name], male    DOB: 1950-10-11, 61 y.o.   MRN: [id number]  HPI CPE- no concerns today.  BP well controlled.  UTD on colonoscopy- due next year ([facility]).  Uro- Isabel Caprice.   Review of Systems Patient reports no vision/hearing changes, anorexia, fever ,adenopathy, persistant/recurrent hoarseness, swallowing issues, chest pain, palpitations, edema, persistant/recurrent cough, hemoptysis, dyspnea (rest,exertional, paroxysmal nocturnal), gastrointestinal  bleeding (melena, rectal bleeding), abdominal pain, excessive heart burn, GU symptoms (dysuria, hematuria, voiding/incontinence issues) syncope, focal weakness (other than known L brachial plexus injury), memory loss, numbness & tingling, skin/hair/nail changes, depression, anxiety, abnormal bruising/bleeding, musculoskeletal symptoms/signs.     Objective:   Physical Exam BP 116/71  Pulse 62  Temp 98.9 F (37.2 C) (Oral)  Ht 6\' 2"  (1.88 m)  Wt 237 lb 6.4 oz (107.684 kg)  BMI 30.48 kg/m2  SpO2 97%  General Appearance:    Alert, cooperative, no distress, appears stated age  Head:    Normocephalic, without obvious abnormality, atraumatic  Eyes:    PERRL, conjunctiva/corneas clear, EOM's intact, fundi    benign, both eyes       Ears:    Normal TM's and external ear canals, both ears  Nose:   Nares normal, septum midline, mucosa normal, no drainage   or sinus tenderness  Throat:   Lips, mucosa, and tongue normal; teeth and gums normal  Neck:   Supple, symmetrical, trachea midline, no adenopathy;       thyroid:  No enlargement/tenderness/nodules  Back:     Symmetric, no curvature, ROM normal, no CVA tenderness  Lungs:     Clear to auscultation bilaterally, respirations unlabored  Chest wall:    No tenderness or deformity  Heart:    Regular rate and rhythm, S1 and S2 normal, I-II/VI SEM w/ aortic click  Abdomen:     Soft, non-tender, bowel sounds active all four quadrants,    no masses, no organomegaly    Genitalia:    Deferred to Uro  Rectal:    Extremities:   Extremities normal, atraumatic, no cyanosis or edema  Pulses:   2+ and symmetric all extremities  Skin:   Skin color, texture, turgor normal, no rashes or lesions  Lymph nodes:   Cervical, supraclavicular, and axillary nodes normal  Neurologic:   CNII-XII intact. L brachial plexus palsy, normal sensation and reflexes throughout          Assessment & Plan:

## 2012-03-08 NOTE — Assessment & Plan Note (Signed)
Chronic problem.  Excellent control.  No changes.

## 2012-03-08 NOTE — Assessment & Plan Note (Signed)
Pt's PE WNL w/ exception of aortic valve click and known brachial plexus palsy/neuropathy.  UTD on uro and GI (due next year).  Check labs.  Anticipatory guidance provided.

## 2012-03-08 NOTE — Patient Instructions (Addendum)
Follow up in 6 months to recheck BP and cholesterol We'll notify you of your lab results and make any changes if needed Keep up the good work!  You look great! Call with any questions or concerns Enjoy the rest of your summer!! 

## 2012-03-08 NOTE — Assessment & Plan Note (Signed)
Chronic problem.  Check labs.  Adjust meds prn  

## 2012-05-15 ENCOUNTER — Other Ambulatory Visit: Payer: Self-pay | Admitting: Cardiology

## 2012-05-17 ENCOUNTER — Other Ambulatory Visit: Payer: Self-pay | Admitting: *Deleted

## 2012-05-17 MED ORDER — SIMVASTATIN 20 MG PO TABS: 20.0000 mg | ORAL_TABLET | Freq: Every day | ORAL | Status: DC

## 2012-06-20 ENCOUNTER — Other Ambulatory Visit: Payer: Self-pay | Admitting: Cardiology

## 2012-06-21 DIAGNOSIS — L909 Atrophic disorder of skin, unspecified: Secondary | ICD-10-CM | POA: Diagnosis not present

## 2012-06-21 DIAGNOSIS — L821 Other seborrheic keratosis: Secondary | ICD-10-CM | POA: Diagnosis not present

## 2012-06-21 DIAGNOSIS — D485 Neoplasm of uncertain behavior of skin: Secondary | ICD-10-CM | POA: Diagnosis not present

## 2012-06-21 DIAGNOSIS — I781 Nevus, non-neoplastic: Secondary | ICD-10-CM | POA: Diagnosis not present

## 2012-07-07 ENCOUNTER — Telehealth: Payer: Self-pay | Admitting: Family Medicine

## 2012-07-07 NOTE — Telephone Encounter (Signed)
Pt is calling and wanting to know if he can get a tdap shot- pls call pt

## 2012-07-07 NOTE — Telephone Encounter (Signed)
Pt had CPE on 03-08-12.  Pt's calling requesting tdap.  Needing orders to give tdap.//AB/CMA

## 2012-07-07 NOTE — Telephone Encounter (Signed)
Ok for Tdap 

## 2012-07-08 NOTE — Telephone Encounter (Signed)
Dr. Karl Luke for pt to have tdap.  Would your please call and sch an appt.   Thank you!//AB/CMA

## 2012-07-08 NOTE — Telephone Encounter (Signed)
[name] [name] scheduled

## 2012-07-12 ENCOUNTER — Ambulatory Visit (INDEPENDENT_AMBULATORY_CARE_PROVIDER_SITE_OTHER): Payer: 59

## 2012-07-12 DIAGNOSIS — Z23 Encounter for immunization: Secondary | ICD-10-CM | POA: Diagnosis not present

## 2012-07-18 ENCOUNTER — Other Ambulatory Visit: Payer: Self-pay | Admitting: Cardiology

## 2012-08-04 DIAGNOSIS — D485 Neoplasm of uncertain behavior of skin: Secondary | ICD-10-CM | POA: Diagnosis not present

## 2012-08-04 DIAGNOSIS — D2360 Other benign neoplasm of skin of unspecified upper limb, including shoulder: Secondary | ICD-10-CM | POA: Diagnosis not present

## 2012-08-19 ENCOUNTER — Other Ambulatory Visit: Payer: Self-pay | Admitting: Cardiology

## 2012-09-08 ENCOUNTER — Encounter: Payer: Self-pay | Admitting: Family Medicine

## 2012-09-08 ENCOUNTER — Encounter: Payer: Self-pay | Admitting: Internal Medicine

## 2012-09-08 ENCOUNTER — Ambulatory Visit (INDEPENDENT_AMBULATORY_CARE_PROVIDER_SITE_OTHER): Payer: 59 | Admitting: Family Medicine

## 2012-09-08 VITALS — BP 100/72 | HR 64 | Wt 240.0 lb

## 2012-09-08 DIAGNOSIS — I10 Essential (primary) hypertension: Secondary | ICD-10-CM

## 2012-09-08 DIAGNOSIS — E785 Hyperlipidemia, unspecified: Secondary | ICD-10-CM

## 2012-09-08 DIAGNOSIS — Z2911 Encounter for prophylactic immunotherapy for respiratory syncytial virus (RSV): Secondary | ICD-10-CM | POA: Diagnosis not present

## 2012-09-08 DIAGNOSIS — Z1211 Encounter for screening for malignant neoplasm of colon: Secondary | ICD-10-CM

## 2012-09-08 DIAGNOSIS — Z0000 Encounter for general adult medical examination without abnormal findings: Secondary | ICD-10-CM

## 2012-09-08 LAB — BASIC METABOLIC PANEL
BUN: 11 mg/dL (ref 6–23)
Calcium: 9 mg/dL (ref 8.4–10.5)
Chloride: 104 mEq/L (ref 96–112)
Creatinine, Ser: 0.9 mg/dL (ref 0.4–1.5)
GFR: 97.67 mL/min (ref >60.00)

## 2012-09-08 LAB — HEPATIC FUNCTION PANEL
AST: 21 U/L (ref 0–37)
Alkaline Phosphatase: 57 U/L (ref 39–117)
Bilirubin, Direct: 0.1 mg/dL (ref 0.0–0.3)
Total Protein: 6.9 g/dL (ref 6.0–8.3)

## 2012-09-08 LAB — LIPID PANEL
Cholesterol: 128 mg/dL (ref 0–200)
LDL Cholesterol: 63 mg/dL (ref 0–99)

## 2012-09-08 NOTE — Patient Instructions (Addendum)
Schedule your complete physical in 6 months (August) We'll notify you of your lab results and make any changes if needed Call with any questions or concerns Congrats on the grandbaby!!!

## 2012-09-08 NOTE — Assessment & Plan Note (Signed)
Chronic problem.  Tolerating meds w/out difficulty.  Check labs.  Adjust meds prn  

## 2012-09-08 NOTE — Assessment & Plan Note (Signed)
Chronic problem.  Excellent control.  Asymptomatic.  No changes. 

## 2012-09-08 NOTE — Progress Notes (Signed)
  Subjective:    Patient ID: [name] [name], male    DOB: 1951/05/31, 62 y.o.   MRN: [id number]  HPI HTN- chronic problem, on Atenolol, Micardis.  Excellent control.  No CP, mild SOB w/ activity, HAs, visual changes, edema.  Hyperlipidemia- chronic problem, on Simvastatin.  No N/V, abd pain, myalgias.  Exercising regularly- walking.   Review of Systems For ROS see HPI     Objective:   Physical Exam  Vitals reviewed. Constitutional: He is oriented to person, place, and time. He appears well-developed and well-nourished. No distress.  HENT:  Head: Normocephalic and atraumatic.  Eyes: Conjunctivae normal and EOM are normal. Pupils are equal, round, and reactive to light.  Neck: Normal range of motion. Neck supple. No thyromegaly present.  Cardiovascular: Normal rate, regular rhythm and intact distal pulses.   Murmur (loud mechanical aortic valve) heard. Pulmonary/Chest: Effort normal and breath sounds normal. No respiratory distress.  Abdominal: Soft. Bowel sounds are normal. He exhibits no distension.  Musculoskeletal: He exhibits no edema.  Lymphadenopathy:    He has no cervical adenopathy.  Neurological: He is alert and oriented to person, place, and time. No cranial nerve deficit.  Skin: Skin is warm and dry.  Psychiatric: He has a normal mood and affect. His behavior is normal.          Assessment & Plan:

## 2012-09-08 NOTE — Addendum Note (Signed)
Addended by: Sheliah Hatch on: 09/08/2012 02:37 PM   Modules accepted: Orders

## 2012-09-12 ENCOUNTER — Telehealth: Payer: Self-pay | Admitting: *Deleted

## 2012-09-12 NOTE — Telephone Encounter (Signed)
Based on what you told me (prior screening colonoscopy September 2005 with Dr. Evette Cristal been negative with followup in 10 years recommended and no family history or relevant clinical issues) and my review of the chart not indicating any clinical reason to proceed now, his routine followup screening colonoscopy would be due around September 2015. You can make a recall for that time. Please explain this to the patient and I will send this note to Dr. Beverely Low just to be certain there were no other issues that we were not aware of... Thanks

## 2012-09-12 NOTE — Telephone Encounter (Signed)
No other issues- thanks for looking into this for me.  He thought he was due for repeat and I told him I would look into for him.  Thanks again.

## 2012-09-12 NOTE — Telephone Encounter (Signed)
Patient is for recall colonoscopy screening sent by Dr.Tabori. In E-chart found that patient's last colonoscopy was 04-08-2004 by Dr.Ganem with normal results and repeat in 10 years. Spoke with patient, he states no family history of colon CA and no GI complaints. Patient thought colon exam was done in 2004. Please review chart, is okay for patient to have colonoscopy now? Thanks, previsit

## 2012-09-21 ENCOUNTER — Other Ambulatory Visit: Payer: Self-pay | Admitting: Cardiology

## 2012-09-27 ENCOUNTER — Encounter: Payer: 59 | Admitting: Internal Medicine

## 2012-10-11 DIAGNOSIS — R972 Elevated prostate specific antigen [PSA]: Secondary | ICD-10-CM | POA: Diagnosis not present

## 2012-10-12 ENCOUNTER — Encounter: Payer: Self-pay | Admitting: Cardiology

## 2012-10-12 ENCOUNTER — Ambulatory Visit (INDEPENDENT_AMBULATORY_CARE_PROVIDER_SITE_OTHER): Payer: 59 | Admitting: Cardiology

## 2012-10-12 VITALS — BP 126/72 | HR 70 | Ht 74.0 in | Wt 240.0 lb

## 2012-10-12 DIAGNOSIS — R0609 Other forms of dyspnea: Secondary | ICD-10-CM | POA: Insufficient documentation

## 2012-10-12 DIAGNOSIS — Z954 Presence of other heart-valve replacement: Secondary | ICD-10-CM | POA: Diagnosis not present

## 2012-10-12 NOTE — Patient Instructions (Addendum)
Your physician recommends that you have lab work today:  BNP   Your physician has requested that you have en exercise stress myoview. For further information please visit [email or web address] Please follow instruction sheet, as given.  Your physician has requested that you have an echocardiogram. Echocardiography is a painless test that uses sound waves to create images of your heart. It provides your doctor with information about the size and shape of your heart and how well your hearts chambers and valves are working. This procedure takes approximately one hour. There are no restrictions for this procedure.  Your physician recommends that you schedule a follow-up appointment in 3 months with Dr. Shirlee Latch

## 2012-10-12 NOTE — Progress Notes (Signed)
Patient ID: [name] [name], male   DOB: 10/30/1950, 62 y.o.   MRN: [id number] PCP: Dr. Beverely Low  62 yo with history of aortic insufficiency s/p Ross procedure presents for cardiology followup.  He has a left-sided brachial plexopathy of uncertain etiology and has atrophy of his left shoulder and upper arm.  He has developed weakness in his right hand as well as occasional vertigo-type spells.  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    He had an echo in 4/13 with EF 55%, mild AI, bioprosthetic pulmonic valve with peak pressure 25 mmHg.  Over the last 6 months, he has developed some dyspnea walking up steps or up a hill that is new for him.  He can walk on flat ground and golfs without difficulty.  No chest pain.  He has also been less active in general for the last 6 months.  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  Weight is up 3 lbs.    ECG: NSR, PVCs, slight scooped anterolateral ST depression  Labs (2/14): K 3.7, creatinine 0.9, LDL 63, HDL 29  PMH: 1. Aortic insufficiency: s/p Ross procedure in 1994 in [place].  Echo (4/11) with hypokinetic basal septum (likely post-surgical), EF 50%, mild LVH, s/p Ross procedure with native pulmonic valve in aortic position with mild aortic insufficiency and bioprosthetic pulmonic valve with no pulmonic insufficiency, mild MR, aortic upper normal in size.  Echo (4/13) with EF 55%, mild LVH, mild AI, mild MR, mild RV dilation, bioprosthetic pulmonic valve with peak pressure 25 mmHg.  2. Post-operative atrial fibrillation after heart surgery.  3. Normal left heart cath prior to 1994 heart surgery.  4. Left brachial plexopathy with left shoulder muscle atrophy.  Uncertain etiology.  5. BPH 6.  Hyperlipidemia.  7. Cervical spinal stenosis with radiculopathy.   8. 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   FH: Prostate CA, HTN  SH: Lives in [place], works part time in a window washing business, married, nonsmoker.   ROS: All systems reviewed and negative except as per HPI.    Current Outpatient Prescriptions  Medication Sig Dispense Refill  . aspirin 81 MG chewable tablet Chew 81 mg by mouth daily.        Marland Kitchen atenolol (TENORMIN) 25 MG tablet TAKE ONE TABLET BY MOUTH ONE TIME DAILY  90 tablet  3  . finasteride (PROSCAR) 5 MG tablet Take 5 mg by mouth daily.        Marland Kitchen MICARDIS 80 MG tablet TAKE ONE TABLET BY MOUTH ONE TIME DAILY  30 tablet  0  . simvastatin (ZOCOR) 20 MG tablet Take 1 tablet (20 mg total) by mouth daily.  30 tablet  6  . VIAGRA 100 MG tablet        No current facility-administered medications for this visit.    BP 126/72  Pulse 70  Ht 6\' 2"  (1.88 m)  Wt 240 lb (108.863 kg)  BMI 30.8 kg/m2 General: NAD, overweight Neck: No JVD, no thyromegaly or thyroid nodule.  Lungs: Clear to auscultation bilaterally with normal respiratory effort. CV: Nondisplaced PMI.  Heart regular S1/S2, fixed split S2, no S3/S4, 2/6 SEM radiating to  the neck.  No peripheral edema.  Normal pedal pulses.  Abdomen: Soft, nontender, no hepatosplenomegaly, no distention.  Neurologic: Alert and oriented x 3.  Psych: Normal affect. Extremities: No clubbing or cyanosis.  MSK: Atrophy of left shoulder muscles  Assessment/Plan: 1. Exertional dyspnea: This has been new for about 6 months.  He is not volume overloaded on exam.  He has been less active since retiring, so deconditioning may play a role.  - ETT-Sestamibi to assess for ischemia. - Echocardiogram to assess aortic and pulmonic valves - BNP 2. History of Ross Procedure: Mild AI and mildly elevated bioprosthetic pulmonic valve gradient on echo in 4/13.  No  evidence by echo for aortic root/ascending aorta aneurysmal dilation, which can occur after the Ross procedure. As above, given new dyspnea, will get repeat echo.   Marca Ancona 10/12/2012

## 2012-10-18 ENCOUNTER — Other Ambulatory Visit (HOSPITAL_COMMUNITY): Payer: 59

## 2012-10-19 ENCOUNTER — Ambulatory Visit (HOSPITAL_COMMUNITY): Payer: 59 | Attending: Cardiology

## 2012-10-19 ENCOUNTER — Other Ambulatory Visit: Payer: Self-pay | Admitting: Cardiology

## 2012-10-19 DIAGNOSIS — I10 Essential (primary) hypertension: Secondary | ICD-10-CM | POA: Diagnosis not present

## 2012-10-19 DIAGNOSIS — E785 Hyperlipidemia, unspecified: Secondary | ICD-10-CM | POA: Diagnosis not present

## 2012-10-19 DIAGNOSIS — I079 Rheumatic tricuspid valve disease, unspecified: Secondary | ICD-10-CM | POA: Insufficient documentation

## 2012-10-19 DIAGNOSIS — I059 Rheumatic mitral valve disease, unspecified: Secondary | ICD-10-CM | POA: Diagnosis not present

## 2012-10-19 DIAGNOSIS — I359 Nonrheumatic aortic valve disorder, unspecified: Secondary | ICD-10-CM | POA: Diagnosis not present

## 2012-10-19 NOTE — Progress Notes (Signed)
Echocardiogram performed.  

## 2012-10-20 ENCOUNTER — Ambulatory Visit (HOSPITAL_COMMUNITY): Payer: 59 | Attending: Cardiology | Admitting: Radiology

## 2012-10-20 ENCOUNTER — Telehealth: Payer: Self-pay | Admitting: Cardiology

## 2012-10-20 VITALS — Ht 75.0 in | Wt 235.0 lb

## 2012-10-20 DIAGNOSIS — R0989 Other specified symptoms and signs involving the circulatory and respiratory systems: Secondary | ICD-10-CM | POA: Insufficient documentation

## 2012-10-20 DIAGNOSIS — I359 Nonrheumatic aortic valve disorder, unspecified: Secondary | ICD-10-CM

## 2012-10-20 DIAGNOSIS — I10 Essential (primary) hypertension: Secondary | ICD-10-CM | POA: Insufficient documentation

## 2012-10-20 DIAGNOSIS — R9439 Abnormal result of other cardiovascular function study: Secondary | ICD-10-CM | POA: Insufficient documentation

## 2012-10-20 DIAGNOSIS — E785 Hyperlipidemia, unspecified: Secondary | ICD-10-CM | POA: Diagnosis not present

## 2012-10-20 DIAGNOSIS — R0609 Other forms of dyspnea: Secondary | ICD-10-CM

## 2012-10-20 DIAGNOSIS — R5381 Other malaise: Secondary | ICD-10-CM | POA: Diagnosis not present

## 2012-10-20 DIAGNOSIS — R42 Dizziness and giddiness: Secondary | ICD-10-CM | POA: Insufficient documentation

## 2012-10-20 MED ORDER — TECHNETIUM TC 99M SESTAMIBI GENERIC - CARDIOLITE
11.0000 | Freq: Once | INTRAVENOUS | Status: AC | PRN
  Administered 2012-10-20: 11 via INTRAVENOUS

## 2012-10-20 MED ORDER — TECHNETIUM TC 99M SESTAMIBI GENERIC - CARDIOLITE
33.0000 | Freq: Once | INTRAVENOUS | Status: AC | PRN
  Administered 2012-10-20: 33 via INTRAVENOUS

## 2012-10-20 MED ORDER — REGADENOSON 0.4 MG/5ML IV SOLN
0.4000 mg | Freq: Once | INTRAVENOUS | Status: AC
  Administered 2012-10-20: 0.4 mg via INTRAVENOUS

## 2012-10-20 NOTE — Progress Notes (Addendum)
[facility] SITE 3 NUCLEAR MED [address] [place], [place] [address] [phone]    Cardiology Nuclear Med Study  [name] [name] is a 62 y.o. male     MRN : [id number]     DOB: 29-May-1951  Procedure Date: 10/20/2012  Nuclear Med Background Indication for Stress Test:  Evaluation for Ischemia History:  '94 Cath: Normal Coronaries, '94 Ross Procedure for AI with post op AFIB, '08 Myocardial Perfusion Study-normal perfusion with septal akinesia, EF=41%, and 11-2011 Echo: EF=55% Cardiac Risk Factors: History of Smoking, Hypertension and Lipids  Symptoms:  Dizziness, DOE, Fatigue, Fatigue with Exertion and Light-Headedness   Nuclear Pre-Procedure Caffeine/Decaff Intake:  None > 12 hrs NPO After: 6:30pm   Lungs:  clear O2 Sat: 98% on room air. IV 0.9% NS with Angio Cath:  20g  IV Site: R Antecubital x 1, tolerated well IV Started by:  Irean Hong, RN  Chest Size (in):  46 Cup Size: n/a  Height: 6\' 3"  (1.905 m)  Weight:  235 lb (106.595 kg)  BMI:  Body mass index is 29.37 kg/(m^2). Tech Comments:  Held Atenolol x 36 hrs    Nuclear Med Study 1 or 2 day study: 1 day  Stress Test Type:  Treadmill/Lexiscan  Reading MD: Olga Millers, MD  Order Authorizing Provider:  Marca Ancona, MD  Resting Radionuclide: Technetium 75m Sestamibi  Resting Radionuclide Dose: 11. mCi   Stress Radionuclide:  Technetium 77m Sestamibi  Stress Radionuclide Dose: 33.0 mCi           Stress Protocol Rest HR: 62 Stress HR: 112  Rest BP: 131/81 Stress BP: 191/87  Exercise Time (min): 5:01 METS: 5.2   Predicted Max HR: 160 bpm % Max HR: 70 bpm Rate Pressure Product: 46962   Dose of Adenosine (mg):  n/a Dose of Lexiscan: 0.4 mg  Dose of Atropine (mg): n/a Dose of Dobutamine: n/a mcg/kg/min (at max HR)  Stress Test Technologist: Irean Hong, RN  Nuclear Technologist:  Domenic Polite, CNMT     Rest Procedure:  Myocardial perfusion imaging was performed at rest 45 minutes following the  intravenous administration of Technetium 78m Sestamibi. Rest ECG: NSR - Normal EKG  Stress Procedure:  The patient exercise for 4 minutes on the treadmill utilizing the Bruce protocol but unable to reach target heart rate due to DOE, RPE=17. The patient received IV Lexiscan 0.4 mg over 15-seconds with concurrent low level exercise and then Technetium 19m Sestamibi was injected at 30-seconds while the patient continued walking one more minute. There was a mild hypertensive response to exercise. Quantitative spect images were obtained after a 45-minute delay. Stress ECG: Significant ST abnormalities consistent with ischemia.  QPS Raw Data Images:  Acquisition technically good; LVE. Stress Images:  Normal homogeneous uptake in all areas of the myocardium. Rest Images:  Normal homogeneous uptake in all areas of the myocardium. Subtraction (SDS):  No evidence of ischemia. Transient Ischemic Dilatation (Normal <1.22):  1.02 Lung/Heart Ratio (Normal <0.45):  0.50  Quantitative Gated Spect Images QGS EDV:  150 ml QGS ESV:  76 ml  Impression Exercise Capacity:  Lexiscan with low level exercise. BP Response:  Normal blood pressure response. Clinical Symptoms:  There is dyspnea. ECG Impression:  Significant ST abnormalities consistent with ischemia. Comparison with Prior Nuclear Study: No images to compare  Overall Impression:  Stress nuclear study with normal perfusion but intermediate risk based on calculated EF.  LV Ejection Fraction: 49%.  LV Wall Motion:  NL LV Function;  NL Wall Motion; LV function visually appears better than calculated EF; suggest echocardiogram to better assess.  Olga Millers  Probably normal.  No evidence for ischemia or infarction.  EF calculated as slightly low but looks normal on echo, suspect echo more accurate.   Marca Ancona 10/23/2012 12:58 PM

## 2012-10-20 NOTE — Telephone Encounter (Signed)
Echo results given 

## 2012-10-20 NOTE — Telephone Encounter (Signed)
New problem    Pt stated some one left a message on his answer machine but didn't say who.

## 2012-10-24 NOTE — Progress Notes (Signed)
Pt.notified

## 2012-10-26 ENCOUNTER — Ambulatory Visit (INDEPENDENT_AMBULATORY_CARE_PROVIDER_SITE_OTHER): Payer: 59 | Admitting: Family Medicine

## 2012-10-26 ENCOUNTER — Encounter: Payer: Self-pay | Admitting: Family Medicine

## 2012-10-26 VITALS — BP 124/82 | HR 61 | Temp 98.3°F | Ht 74.75 in | Wt 238.2 lb

## 2012-10-26 DIAGNOSIS — H539 Unspecified visual disturbance: Secondary | ICD-10-CM

## 2012-10-26 DIAGNOSIS — J0100 Acute maxillary sinusitis, unspecified: Secondary | ICD-10-CM | POA: Diagnosis not present

## 2012-10-26 MED ORDER — AZITHROMYCIN 250 MG PO TABS: ORAL_TABLET | ORAL | Status: DC

## 2012-10-26 NOTE — Assessment & Plan Note (Signed)
Pt w/ hx of visual disturbances, muscle weakness, brachial plexus palsy.  Now w/ family hx of myasthenia gravis.  Pt has seen neuro previously but no recollection of testing for MG.  Will get acetylcholine receptor abs to determine if MG present.  Will follow.

## 2012-10-26 NOTE — Patient Instructions (Addendum)
Take the Zpack as directed for the sinuses Drink plenty of fluids Start Claritin or Zyrtec daily We'll notify you of your lab results Call with any questions or concerns Hang in there!

## 2012-10-26 NOTE — Assessment & Plan Note (Signed)
New.  Pt's sxs and PE consistent w/ infxn.  Start abx.  Reviewed supportive care and red flags that should prompt return.  Pt expressed understanding and is in agreement w/ plan.  

## 2012-10-26 NOTE — Progress Notes (Signed)
  Subjective:    Patient ID: [name] [name], male    DOB: 1951/01/29, 62 y.o.   MRN: [id number]  HPI Myasthenia gravis- 2nd cousin recently dx'd.  Family thought pt should know b/c of the brachial plexus neuropathy.  Pt looked online and was concerned about some of the sxs he saw.  Pt has had blurry/double vision, difficulty swallowing (has had esophageal stricture stretching x2), muscle weakness- particularly L arm.  No ptosis, dysarthria.  URI- sxs started Sunday and thought it was initially allergies.  + sore throat, runny nose.  Now w/ chest congestion, sinus pain/pressure.  Using Nyquil w/out relief.  Also tried sudafed and benadryl.  Not currently on claritin/zyrtec.  Now having dry cough.  No fevers.  + HA.  Uncomfortable to lean forward.  + tooth pain.  Pt is requesting Zpack b/c this worked well previously.   Review of Systems For ROS see HPI     Objective:   Physical Exam  Vitals reviewed. Constitutional: He appears well-developed and well-nourished. No distress.  HENT:  Head: Normocephalic and atraumatic.  Right Ear: Tympanic membrane normal.  Left Ear: Tympanic membrane normal.  Nose: Mucosal edema and rhinorrhea present. Right sinus exhibits maxillary sinus tenderness. Right sinus exhibits no frontal sinus tenderness. Left sinus exhibits maxillary sinus tenderness. Left sinus exhibits no frontal sinus tenderness.  Mouth/Throat: Mucous membranes are normal. Oropharyngeal exudate and posterior oropharyngeal erythema present. No posterior oropharyngeal edema.  + PND  Eyes: Conjunctivae and EOM are normal. Pupils are equal, round, and reactive to light.  Neck: Normal range of motion. Neck supple.  Cardiovascular: Normal rate, regular rhythm and normal heart sounds.   Pulmonary/Chest: Effort normal and breath sounds normal. No respiratory distress. He has no wheezes.  + hacking cough  Lymphadenopathy:    He has no cervical adenopathy.  Skin: Skin is warm and dry.           Assessment & Plan:

## 2012-11-02 LAB — ACETYLCHOLINE RECEPTOR, BINDING: A CHR BINDING ABS: <0.3 nmol/L (ref <=0.30)

## 2012-11-20 ENCOUNTER — Other Ambulatory Visit: Payer: Self-pay | Admitting: Cardiology

## 2012-12-20 ENCOUNTER — Other Ambulatory Visit: Payer: Self-pay | Admitting: Cardiology

## 2012-12-20 ENCOUNTER — Other Ambulatory Visit: Payer: Self-pay

## 2012-12-20 MED ORDER — TELMISARTAN 80 MG PO TABS: 80.0000 mg | ORAL_TABLET | Freq: Every day | ORAL | Status: DC

## 2012-12-20 NOTE — Telephone Encounter (Signed)
MICARDIS 80 MG tablet  TAKE ONE TABLET BY MOUTH ONE TIME DAILY   30 tablet   0    Patient Instructions  Your physician recommends that you have lab work today:  BNP       Your physician has requested that you have en exercise stress myoview. For further information please visit [email or web address] Please follow instruction sheet, as given.   Your physician has requested that you have an echocardiogram. Echocardiography is a painless test that uses sound waves to create images of your heart. It provides your doctor with information about the size and shape of your heart and how well your heart's chambers and valves are working. This procedure takes approximately one hour. There are no restrictions for this procedure.   Your physician recommends that you schedule a follow-up appointment in 3 months with Dr. Shirlee Latch Patient Instructions History Recorded     Previous Visit  Provider Department Encounter #  09/27/2012  8:00 AM Marca Ancona, MD [facility] [id number]

## 2012-12-23 ENCOUNTER — Encounter: Payer: Self-pay | Admitting: Cardiology

## 2013-01-16 ENCOUNTER — Encounter: Payer: Self-pay | Admitting: Cardiology

## 2013-01-16 ENCOUNTER — Ambulatory Visit (INDEPENDENT_AMBULATORY_CARE_PROVIDER_SITE_OTHER): Payer: 59 | Admitting: Cardiology

## 2013-01-16 VITALS — BP 116/66 | HR 72 | Ht 75.0 in | Wt 235.0 lb

## 2013-01-16 DIAGNOSIS — Z952 Presence of prosthetic heart valve: Secondary | ICD-10-CM

## 2013-01-16 DIAGNOSIS — R0989 Other specified symptoms and signs involving the circulatory and respiratory systems: Secondary | ICD-10-CM | POA: Diagnosis not present

## 2013-01-16 DIAGNOSIS — I359 Nonrheumatic aortic valve disorder, unspecified: Secondary | ICD-10-CM

## 2013-01-16 DIAGNOSIS — Z954 Presence of other heart-valve replacement: Secondary | ICD-10-CM

## 2013-01-16 MED ORDER — TELMISARTAN 80 MG PO TABS: 80.0000 mg | ORAL_TABLET | Freq: Every day | ORAL | Status: DC

## 2013-01-16 MED ORDER — SIMVASTATIN 20 MG PO TABS: 20.0000 mg | ORAL_TABLET | Freq: Every day | ORAL | Status: DC

## 2013-01-16 NOTE — Patient Instructions (Addendum)
Your physician has requested that you have a carotid duplex. This test is an ultrasound of the carotid arteries in your neck. It looks at blood flow through these arteries that supply the brain with blood. Allow one hour for this exam. There are no restrictions or special instructions. In the next week or so .  Your physician has requested that you have an echocardiogram. Echocardiography is a painless test that uses sound waves to create images of your heart. It provides your doctor with information about the size and shape of your heart and how well your heart's chambers and valves are working. This procedure takes approximately one hour. There are no restrictions for this procedure. MARCH 2015  Your physician wants you to follow-up in: MARCH 2015 with Dr Shirlee Latch after you have had the echo done. You will receive a reminder letter in the mail two months in advance. If you don't receive a letter, please call our office to schedule the follow-up appointment.

## 2013-01-16 NOTE — Progress Notes (Signed)
Patient ID: [name] [name], male   DOB: Nov 06, 1950, 62 y.o.   MRN: [id number] PCP: Dr. Beverely Low  62 yo with history of aortic insufficiency s/p Ross procedure presents for cardiology followup.  He has a left-sided brachial plexopathy of uncertain etiology and has atrophy of his left shoulder and upper arm.  He has developed weakness in his right hand as well as occasional vertigo-type spells.  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    At last appointment, he reported new exertional dyspnea.  I had him do an ETT-Sestamibi, which was normal with no evidence for ischemia or infarction.  Echo showed preserved EF with stable aortic and pulmonic valves.  BNP was not elevated.  I encouraged him to exercise more to improve his stamina.  He has been walking for exercise and playing golf twice a week.  He actually feels much better and endurance has increased.  Now, he only gets mild dyspnea walking up steps.   Labs (2/14): K 3.7, creatinine 0.9, LDL 63, HDL 29 Labs (3/14): BNP 36  PMH: 1. Aortic insufficiency: s/p Ross procedure in 1994 [place].  Echo (4/11) with hypokinetic basal septum (likely post-surgical), EF 50%, mild LVH, s/p Ross procedure with native pulmonic valve in aortic position with mild aortic insufficiency and bioprosthetic pulmonic valve with no pulmonic insufficiency, mild MR, aortic upper normal in size.  Echo (4/13) with EF 55%, mild LVH, mild AI, mild MR, mild RV dilation, bioprosthetic pulmonic valve with peak pressure 25 mmHg.  Echo (3/14): Mild LV dilation, mild LVH, EF 60%, pulmonary valve in aortic position (s/p Ross) with mild AI and mildly dilated ascending aorta to 4.3 cm, mildly dilated RV with normal systolic  function, bioprosthetic PVR with mean gradient 23 mmHg. 2. Post-operative atrial fibrillation after heart surgery.  3. Normal left heart cath prior to 1994 heart surgery.  4. Left brachial plexopathy with left shoulder muscle atrophy.  Uncertain etiology.  5. BPH 6. Hyperlipidemia.  7. Cervical spinal stenosis with radiculopathy.   8. 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  9. ETT-Sestamibi (3/14): No ischemia or infarction.   FH: Prostate CA, HTN  SH: Lives in [place], works part time in a window washing business, married, nonsmoker.   ROS: All systems reviewed and negative except as per HPI.    Current Outpatient Prescriptions  Medication Sig Dispense Refill  . aspirin 81 MG chewable tablet Chew 81 mg by mouth daily.        Marland Kitchen atenolol (TENORMIN) 25 MG tablet TAKE ONE TABLET BY MOUTH ONE TIME DAILY  90 tablet  3  . finasteride (PROSCAR) 5 MG tablet Take 5 mg by mouth daily.        . simvastatin (ZOCOR) 20 MG tablet Take 1 tablet (20 mg total) by mouth daily.  90 tablet  3  . telmisartan (MICARDIS) 80 MG tablet Take 1 tablet (80 mg total) by mouth daily.  90 tablet  3  . VIAGRA 100 MG tablet        No current facility-administered medications for this visit.    BP 116/66  Pulse 72  Ht 6\' 3"  (1.905 m)  Wt 235 lb (106.595 kg)  BMI 29.37 kg/m2 General: NAD, overweight Neck: No JVD, no thyromegaly or thyroid nodule.  Lungs: Clear to auscultation bilaterally with normal respiratory effort. CV: Nondisplaced PMI.  Heart regular S1/S2, fixed split S2, no S3/S4, 2/6 SEM radiating to the neck versus bruit on the right.  No peripheral edema.  Normal pedal pulses.  Abdomen: Soft, nontender, no hepatosplenomegaly, no distention.  Neurologic: Alert and oriented x 3.  Psych: Normal affect. Extremities: No clubbing or cyanosis.  MSK: Atrophy of left shoulder muscles  Assessment/Plan: 1. Exertional dyspnea:  I  suspect that deconditioning played a role here.  Dyspnea has mostly resolved since patient started an exercise program. No ischemia or infarction on ETT-Sestamibi and valves were stable on echo.  Continue exercise.  2. History of Ross Procedure: Mild AI and mildly elevated bioprosthetic pulmonic valve gradient on echo in 3/14.  Mild aortic root dilation to 4.3 cm needs to be followed in the future (this often accompanies Ross Procedure.  Repeat echo in 3/15.  3. Carotid bruit:  On right, may be radiation from heart murmur. Will assess with carotid dopplers.   Marca Ancona 01/16/2013

## 2013-01-18 ENCOUNTER — Encounter (INDEPENDENT_AMBULATORY_CARE_PROVIDER_SITE_OTHER): Payer: 59

## 2013-01-18 DIAGNOSIS — I359 Nonrheumatic aortic valve disorder, unspecified: Secondary | ICD-10-CM

## 2013-01-18 DIAGNOSIS — I6529 Occlusion and stenosis of unspecified carotid artery: Secondary | ICD-10-CM | POA: Diagnosis not present

## 2013-01-18 DIAGNOSIS — Z952 Presence of prosthetic heart valve: Secondary | ICD-10-CM

## 2013-01-18 DIAGNOSIS — R0989 Other specified symptoms and signs involving the circulatory and respiratory systems: Secondary | ICD-10-CM | POA: Diagnosis not present

## 2013-01-31 ENCOUNTER — Ambulatory Visit (INDEPENDENT_AMBULATORY_CARE_PROVIDER_SITE_OTHER): Payer: 59 | Admitting: Family Medicine

## 2013-01-31 ENCOUNTER — Encounter: Payer: Self-pay | Admitting: Family Medicine

## 2013-01-31 VITALS — BP 112/72 | HR 69 | Temp 98.2°F | Wt 235.0 lb

## 2013-01-31 DIAGNOSIS — H00019 Hordeolum externum unspecified eye, unspecified eyelid: Secondary | ICD-10-CM | POA: Insufficient documentation

## 2013-01-31 DIAGNOSIS — H00016 Hordeolum externum left eye, unspecified eyelid: Secondary | ICD-10-CM

## 2013-01-31 MED ORDER — SULFAMETHOXAZOLE-TRIMETHOPRIM 800-160 MG PO TABS: 1.0000 | ORAL_TABLET | Freq: Two times a day (BID) | ORAL | Status: DC

## 2013-01-31 NOTE — Patient Instructions (Addendum)
This is a stye Apply hot compresses to improve the swelling If you develop worsening pain or redness- start the Bactrim Call with any questions or concerns Have a great holiday!!!

## 2013-01-31 NOTE — Assessment & Plan Note (Signed)
New.  Reviewed dx and supportive care w/ pt.  Script given for bactrim in case of infection over long holiday weekend.  Reviewed supportive care and red flags that should prompt return.  Pt expressed understanding and is in agreement w/ plan.

## 2013-01-31 NOTE — Progress Notes (Signed)
  Subjective:    Patient ID: [name] [name], male    DOB: 08/23/1950, 62 y.o.   MRN: [id number]  HPI Eye swelling- L upper eyelid.  1st noticed Sunday afternoon.  + red, swollen, not very painful.  'annoying'.  Eye is less swollen today than yesterday.  No visual changes.  No pain w/ eye movement.   Review of Systems For ROS see HPI     Objective:   Physical Exam  Vitals reviewed. Constitutional: He appears well-developed and well-nourished. No distress.  HENT:  Head: Normocephalic and atraumatic.  Eyes: Conjunctivae and EOM are normal. Pupils are equal, round, and reactive to light. Right eye exhibits no discharge. Left eye exhibits no discharge.  Small stye on R upper lid w/ surrounding redness.  No induration or warmth          Assessment & Plan:

## 2013-02-09 ENCOUNTER — Other Ambulatory Visit: Payer: Self-pay | Admitting: Cardiology

## 2013-03-16 ENCOUNTER — Ambulatory Visit (INDEPENDENT_AMBULATORY_CARE_PROVIDER_SITE_OTHER): Payer: 59 | Admitting: Family Medicine

## 2013-03-16 ENCOUNTER — Encounter: Payer: Self-pay | Admitting: Family Medicine

## 2013-03-16 VITALS — BP 122/78 | HR 59 | Temp 98.4°F | Ht 74.25 in | Wt 232.6 lb

## 2013-03-16 DIAGNOSIS — Z0000 Encounter for general adult medical examination without abnormal findings: Secondary | ICD-10-CM | POA: Diagnosis not present

## 2013-03-16 DIAGNOSIS — Z1331 Encounter for screening for depression: Secondary | ICD-10-CM

## 2013-03-16 LAB — HEPATIC FUNCTION PANEL
Albumin: 4.1 g/dL (ref 3.5–5.2)
Alkaline Phosphatase: 54 U/L (ref 39–117)
Total Protein: 6.7 g/dL (ref 6.0–8.3)

## 2013-03-16 LAB — CBC WITH DIFFERENTIAL/PLATELET
Basophils Absolute: 0 10*3/uL (ref 0.0–0.1)
Basophils Relative: 0.4 % (ref 0.0–3.0)
Eosinophils Absolute: 0.2 10*3/uL (ref 0.0–0.7)
Eosinophils Relative: 1.9 % (ref 0.0–5.0)
HCT: 44.2 % (ref 39.0–52.0)
Hemoglobin: 14.7 g/dL (ref 13.0–17.0)
Lymphocytes Relative: 27.8 % (ref 12.0–46.0)
Lymphs Abs: 2.3 10*3/uL (ref 0.7–4.0)
MCHC: 33.2 g/dL (ref 30.0–36.0)
MCV: 90.5 fl (ref 78.0–100.0)
Monocytes Absolute: 0.9 10*3/uL (ref 0.1–1.0)
Monocytes Relative: 10.6 % (ref 3.0–12.0)
Neutro Abs: 5 10*3/uL (ref 1.4–7.7)
Neutrophils Relative %: 59.3 % (ref 43.0–77.0)
Platelets: 285 10*3/uL (ref 150.0–400.0)
RBC: 4.88 Mil/uL (ref 4.22–5.81)
RDW: 13.3 % (ref 11.5–14.6)
WBC: 8.4 10*3/uL (ref 4.5–10.5)

## 2013-03-16 LAB — LIPID PANEL
Cholesterol: 119 mg/dL (ref 0–200)
HDL: 28.8 mg/dL — ABNORMAL LOW (ref >39.00)
LDL Cholesterol: 70 mg/dL (ref 0–99)
Total CHOL/HDL Ratio: 4
Triglycerides: 102 mg/dL (ref 0.0–149.0)
VLDL: 20.4 mg/dL (ref 0.0–40.0)

## 2013-03-16 LAB — BASIC METABOLIC PANEL
CO2: 27 mEq/L (ref 19–32)
Calcium: 9.1 mg/dL (ref 8.4–10.5)
Glucose, Bld: 88 mg/dL (ref 70–99)
Sodium: 137 mEq/L (ref 135–145)

## 2013-03-16 LAB — PSA: PSA: 3.87 ng/mL (ref 0.10–4.00)

## 2013-03-16 NOTE — Patient Instructions (Addendum)
Follow up in 6 months to recheck cholesterol and BP We'll notify you of your lab results and make any changes if needed Keep up the good work!  You look great! Call with any questions or concerns Enjoy the rest of summer!!!

## 2013-03-16 NOTE — Progress Notes (Signed)
  Subjective:    Patient ID: [name] [name], male    DOB: 1950/09/11, 62 y.o.   MRN: [id number]  HPI CPE- no concerns today.  UTD on colonoscopy- not due until this Fall.   Review of Systems Patient reports no vision/hearing changes, anorexia, fever ,adenopathy, persistant/recurrent hoarseness, swallowing issues, chest pain, palpitations, edema, persistant/recurrent cough, hemoptysis, dyspnea (rest,exertional, paroxysmal nocturnal), gastrointestinal  bleeding (melena, rectal bleeding), abdominal pain, excessive heart burn, GU symptoms (dysuria, hematuria, voiding/incontinence issues) syncope, memory loss, skin/hair/nail changes, depression, anxiety, abnormal bruising/bleeding, musculoskeletal symptoms/signs.  Known brachial plexus palsy    Objective:   Physical Exam BP 122/78  Pulse 59  Temp(Src) 98.4 F (36.9 C) (Oral)  Ht 6' 2.25" (1.886 m)  Wt 232 lb 9.6 oz (105.507 kg)  BMI 29.66 kg/m2  SpO2 98%  General Appearance:    Alert, cooperative, no distress, appears stated age  Head:    Normocephalic, without obvious abnormality, atraumatic  Eyes:    PERRL, conjunctiva/corneas clear, EOM's intact, fundi    benign, both eyes       Ears:    Normal TM's and external ear canals, both ears  Nose:   Nares normal, septum midline, mucosa normal, no drainage   or sinus tenderness  Throat:   Lips, mucosa, and tongue normal; teeth and gums normal  Neck:   Supple, symmetrical, trachea midline, no adenopathy;       thyroid:  No enlargement/tenderness/nodules  Back:     Symmetric, no curvature, ROM normal, no CVA tenderness  Lungs:     Clear to auscultation bilaterally, respirations unlabored  Chest wall:    No tenderness or deformity  Heart:    Regular rate and rhythm, S1 and S2 normal, mechanical aortic valve  Abdomen:     Soft, non-tender, bowel sounds active all four quadrants,    no masses, no organomegaly  Genitalia:    Normal male without lesion, discharge or tenderness  Rectal:     Normal tone, normal prostate, no masses or tenderness  Extremities:   Extremities normal, atraumatic, no cyanosis or edema  Pulses:   2+ and symmetric all extremities  Skin:   Skin color, texture, turgor normal, no rashes or lesions  Lymph nodes:   Cervical, supraclavicular, and axillary nodes normal  Neurologic:   CNII-XII intact. Brachial plexus palsy on L          Assessment & Plan:

## 2013-03-16 NOTE — Assessment & Plan Note (Signed)
Pt's PE WNL.  UTD on colonoscopy.  Check labs.  Anticipatory guidance provided.  

## 2013-05-09 ENCOUNTER — Other Ambulatory Visit: Payer: Self-pay | Admitting: *Deleted

## 2013-05-09 MED ORDER — ATENOLOL 25 MG PO TABS: 25.0000 mg | ORAL_TABLET | Freq: Every day | ORAL | Status: DC

## 2013-05-25 ENCOUNTER — Ambulatory Visit (INDEPENDENT_AMBULATORY_CARE_PROVIDER_SITE_OTHER): Payer: 59 | Admitting: General Practice

## 2013-05-25 DIAGNOSIS — Z23 Encounter for immunization: Secondary | ICD-10-CM

## 2013-06-01 ENCOUNTER — Encounter: Payer: Self-pay | Admitting: Family Medicine

## 2013-06-01 ENCOUNTER — Ambulatory Visit (INDEPENDENT_AMBULATORY_CARE_PROVIDER_SITE_OTHER): Payer: 59 | Admitting: Family Medicine

## 2013-06-01 VITALS — BP 130/78 | HR 62 | Temp 97.9°F | Resp 16 | Wt 236.4 lb

## 2013-06-01 DIAGNOSIS — M6740 Ganglion, unspecified site: Secondary | ICD-10-CM

## 2013-06-01 NOTE — Progress Notes (Signed)
  Subjective:    Patient ID: [name] [name], male    DOB: [birth year], 62 y.o.   MRN: [id number]  HPI Bump on L index finger- 1st noted 2 weeks ago.  Not painful.  Hx of ganglion cysts.  Has some associated tightness w/ DIP flexion.  No drainage.   Review of Systems For ROS see HPI     Objective:   Physical Exam  Vitals reviewed. Constitutional: He appears well-developed and well-nourished. No distress.  Musculoskeletal:  <1cm, firm nodule on dorsum of L index finger at DIP joint.  No TTP, no overlying redness or fluctuance          Assessment & Plan:

## 2013-06-01 NOTE — Patient Instructions (Signed)
Follow up as scheduled This appears to be a ganglion and is nothing to worry about unless it enlarges or causes pain Call with any questions or concerns Happy Holidays and Early [place] Ouch!!!

## 2013-06-03 NOTE — Assessment & Plan Note (Signed)
New.  Reviewed dx w/ pt and parameters that should prompt further evaluation- growth, pain, interference in ROM.  Will follow.

## 2013-09-11 DIAGNOSIS — L820 Inflamed seborrheic keratosis: Secondary | ICD-10-CM | POA: Diagnosis not present

## 2013-09-11 DIAGNOSIS — L919 Hypertrophic disorder of the skin, unspecified: Secondary | ICD-10-CM | POA: Diagnosis not present

## 2013-09-11 DIAGNOSIS — L909 Atrophic disorder of skin, unspecified: Secondary | ICD-10-CM | POA: Diagnosis not present

## 2013-09-11 DIAGNOSIS — D485 Neoplasm of uncertain behavior of skin: Secondary | ICD-10-CM | POA: Diagnosis not present

## 2013-09-11 DIAGNOSIS — D239 Other benign neoplasm of skin, unspecified: Secondary | ICD-10-CM | POA: Diagnosis not present

## 2013-09-11 DIAGNOSIS — L570 Actinic keratosis: Secondary | ICD-10-CM | POA: Diagnosis not present

## 2013-09-11 DIAGNOSIS — L821 Other seborrheic keratosis: Secondary | ICD-10-CM | POA: Diagnosis not present

## 2013-09-14 ENCOUNTER — Encounter: Payer: Self-pay | Admitting: Family Medicine

## 2013-09-14 ENCOUNTER — Ambulatory Visit (INDEPENDENT_AMBULATORY_CARE_PROVIDER_SITE_OTHER): Payer: 59 | Admitting: Family Medicine

## 2013-09-14 VITALS — BP 124/82 | HR 68 | Temp 98.2°F | Resp 16 | Wt 244.0 lb

## 2013-09-14 DIAGNOSIS — E785 Hyperlipidemia, unspecified: Secondary | ICD-10-CM

## 2013-09-14 DIAGNOSIS — I10 Essential (primary) hypertension: Secondary | ICD-10-CM | POA: Diagnosis not present

## 2013-09-14 LAB — LIPID PANEL
Cholesterol: 121 mg/dL (ref 0–200)
HDL: 29.5 mg/dL — ABNORMAL LOW (ref >39.00)
LDL CALC: 61 mg/dL (ref 0–99)
Total CHOL/HDL Ratio: 4
Triglycerides: 154 mg/dL — ABNORMAL HIGH (ref 0.0–149.0)
VLDL: 30.8 mg/dL (ref 0.0–40.0)

## 2013-09-14 LAB — BASIC METABOLIC PANEL
BUN: 15 mg/dL (ref 6–23)
CO2: 24 meq/L (ref 19–32)
Calcium: 9.1 mg/dL (ref 8.4–10.5)
Chloride: 108 mEq/L (ref 96–112)
Creatinine, Ser: 0.8 mg/dL (ref 0.4–1.5)
GFR: 109.1 mL/min (ref >60.00)
Glucose, Bld: 121 mg/dL — ABNORMAL HIGH (ref 70–99)
POTASSIUM: 3.7 meq/L (ref 3.5–5.1)
SODIUM: 140 meq/L (ref 135–145)

## 2013-09-14 LAB — HEPATIC FUNCTION PANEL
ALK PHOS: 55 U/L (ref 39–117)
ALT: 28 U/L (ref 0–53)
AST: 21 U/L (ref 0–37)
Albumin: 3.9 g/dL (ref 3.5–5.2)
BILIRUBIN DIRECT: 0.2 mg/dL (ref 0.0–0.3)
BILIRUBIN TOTAL: 1.5 mg/dL — AB (ref 0.3–1.2)
Total Protein: 6.7 g/dL (ref 6.0–8.3)

## 2013-09-14 NOTE — Progress Notes (Signed)
Pre visit review using our clinic review tool, if applicable. No additional management support is needed unless otherwise documented below in the visit note. 

## 2013-09-14 NOTE — Assessment & Plan Note (Signed)
Chronic problem.  Tolerating statin w/o difficulty.  Check labs.  Adjust meds prn  

## 2013-09-14 NOTE — Patient Instructions (Signed)
Schedule your complete physical in 6 months We'll notify you of your lab results and make any changes if needed Keep up the good work! Happy Valentine's Day!!!!

## 2013-09-14 NOTE — Progress Notes (Signed)
   Subjective:    Patient ID: [name] [name], male    DOB: 09-25-1950, 63 y.o.   MRN: [id number]  HPI HTN- chronic problem, on Micardis, Atenolol.  Denies CP, SOB, HAs, visual changes.  Hyperlipidemia- chronic problem, on Simvastatin.  No abd pain, N/V, myalgias   Review of Systems For ROS see HPI     Objective:   Physical Exam  Vitals reviewed. Constitutional: He is oriented to person, place, and time. He appears well-developed and well-nourished. No distress.  HENT:  Head: Normocephalic and atraumatic.  Eyes: Conjunctivae and EOM are normal. Pupils are equal, round, and reactive to light.  Neck: Normal range of motion. Neck supple. No thyromegaly present.  Cardiovascular: Normal rate, regular rhythm and intact distal pulses.   Murmur (II-III/VI SEM consistent w/ aortic valve replacement) heard. Pulmonary/Chest: Effort normal and breath sounds normal. No respiratory distress.  Abdominal: Soft. Bowel sounds are normal. He exhibits no distension.  Musculoskeletal: He exhibits no edema.  Lymphadenopathy:    He has no cervical adenopathy.  Neurological: He is alert and oriented to person, place, and time. No cranial nerve deficit.  Skin: Skin is warm and dry.  Psychiatric: He has a normal mood and affect. His behavior is normal.          Assessment & Plan:

## 2013-09-14 NOTE — Assessment & Plan Note (Signed)
Chronic problem.  Asymptomatic.  Excellent control.  Check labs.  No anticipated med changes.

## 2013-09-15 ENCOUNTER — Telehealth: Payer: Self-pay | Admitting: Family Medicine

## 2013-09-15 NOTE — Telephone Encounter (Signed)
Relevant patient education assigned to patient using Emmi. ° °

## 2013-10-03 ENCOUNTER — Ambulatory Visit (HOSPITAL_COMMUNITY): Payer: 59 | Attending: Cardiology | Admitting: Radiology

## 2013-10-03 DIAGNOSIS — I359 Nonrheumatic aortic valve disorder, unspecified: Secondary | ICD-10-CM | POA: Diagnosis not present

## 2013-10-03 DIAGNOSIS — R0989 Other specified symptoms and signs involving the circulatory and respiratory systems: Secondary | ICD-10-CM

## 2013-10-03 DIAGNOSIS — Z952 Presence of prosthetic heart valve: Secondary | ICD-10-CM

## 2013-10-03 NOTE — Progress Notes (Signed)
Echocardiogram performed.  

## 2013-10-09 ENCOUNTER — Telehealth: Payer: Self-pay | Admitting: Cardiology

## 2013-10-12 DIAGNOSIS — N138 Other obstructive and reflux uropathy: Secondary | ICD-10-CM | POA: Diagnosis not present

## 2013-10-12 DIAGNOSIS — N401 Enlarged prostate with lower urinary tract symptoms: Secondary | ICD-10-CM | POA: Diagnosis not present

## 2013-10-12 DIAGNOSIS — N529 Male erectile dysfunction, unspecified: Secondary | ICD-10-CM | POA: Diagnosis not present

## 2013-10-12 DIAGNOSIS — N139 Obstructive and reflux uropathy, unspecified: Secondary | ICD-10-CM | POA: Diagnosis not present

## 2013-10-29 ENCOUNTER — Other Ambulatory Visit: Payer: Self-pay | Admitting: Cardiology

## 2013-12-06 ENCOUNTER — Encounter: Payer: Self-pay | Admitting: *Deleted

## 2013-12-11 ENCOUNTER — Encounter: Payer: Self-pay | Admitting: Cardiology

## 2013-12-11 ENCOUNTER — Other Ambulatory Visit: Payer: Self-pay

## 2013-12-11 ENCOUNTER — Ambulatory Visit (INDEPENDENT_AMBULATORY_CARE_PROVIDER_SITE_OTHER): Payer: 59 | Admitting: Cardiology

## 2013-12-11 VITALS — BP 134/80 | HR 74 | Ht 75.0 in | Wt 241.0 lb

## 2013-12-11 DIAGNOSIS — I359 Nonrheumatic aortic valve disorder, unspecified: Secondary | ICD-10-CM | POA: Diagnosis not present

## 2013-12-11 DIAGNOSIS — Z954 Presence of other heart-valve replacement: Secondary | ICD-10-CM

## 2013-12-11 DIAGNOSIS — E785 Hyperlipidemia, unspecified: Secondary | ICD-10-CM | POA: Diagnosis not present

## 2013-12-11 DIAGNOSIS — I712 Thoracic aortic aneurysm, without rupture, unspecified: Secondary | ICD-10-CM

## 2013-12-11 DIAGNOSIS — I7121 Aneurysm of the ascending aorta, without rupture: Secondary | ICD-10-CM

## 2013-12-11 DIAGNOSIS — I10 Essential (primary) hypertension: Secondary | ICD-10-CM | POA: Diagnosis not present

## 2013-12-11 NOTE — Patient Instructions (Signed)
Your physician has requested that you have a cardiac MRI. Cardiac MRI uses a computer to create images of your heart as its beating, producing both still and moving pictures of your heart and major blood vessels. For further information please visit [email or web address] Please follow the instruction sheet given to you today for more information.  Your physician has requested that you have an echocardiogram in MARCH OF 2016. Echocardiography is a painless test that uses sound waves to create images of your heart. It provides your doctor with information about the size and shape of your heart and how well your heart's chambers and valves are working. This procedure takes approximately one hour. There are no restrictions for this procedure.  Your physician wants you to follow-up in: 1 year with Dr. Aundra Dubin.  You will receive a reminder letter in the mail two months in advance. If you don't receive a letter, please call our office to schedule the follow-up appointment.

## 2013-12-11 NOTE — Progress Notes (Signed)
Patient ID: [name] [name], male   DOB: [birth year], 63 y.o.   MRN: [id number] PCP: Dr. Birdie Riddle  63 yo with history of aortic insufficiency s/p Ross procedure presents for cardiology followup.  He has a left-sided brachial plexopathy of uncertain etiology and has atrophy of his left shoulder and upper arm.  He has developed weakness in his right hand as well as occasional vertigo-type spells.  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    He has been stable symptomatically.  No exertional dyspnea or chest pain. He walks and plays golf for exercise.  He does some gardening.  Most recent echo in 3/15 showed mild AI, no AS, bioprosthetic pulmonic valve with peak gradient 31 mmHg.  The ascending aorta was dilated to 4.4 cm.   Labs (2/14): K 3.7, creatinine 0.9, LDL 63, HDL 29 Labs (3/14): BNP 36 Labs (2/15): K 3.7, creatinine 0.8, LDL 61, HDL 30  ECG: NSR, RBBB  PMH: 1. Aortic insufficiency: s/p Ross procedure in 1994 in [place].  Echo (4/11) with hypokinetic basal septum (likely post-surgical), EF 50%, mild LVH, s/p Ross procedure with native pulmonic valve in aortic position with mild aortic insufficiency and bioprosthetic pulmonic valve with no pulmonic insufficiency, mild MR, aortic upper normal in size.  Echo (4/13) with EF 55%, mild LVH, mild AI, mild MR, mild RV dilation, bioprosthetic pulmonic valve with peak pressure 25 mmHg.  Echo (3/14): Mild LV dilation, mild LVH, EF 60%, pulmonary valve in aortic position (s/p Ross) with mild AI and mildly dilated ascending aorta to 4.3 cm, mildly dilated RV with normal systolic function, bioprosthetic PVR with mean gradient 23 mmHg.  Echo (3/15) with EF 55-60%, mild LVH, mild AI with no AS,  bioprosthetic pulmonic valve with peak gradient 31 mmHg, ascending aorta 4.4 cm.  2. Post-operative atrial fibrillation after heart surgery.  3. Normal left heart cath prior to 1994 heart surgery.  4. Left brachial plexopathy with left shoulder muscle atrophy.  Uncertain etiology.  5. BPH 6. Hyperlipidemia.  7. Cervical spinal stenosis with radiculopathy.   8. 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  9. ETT-Sestamibi (3/14): No ischemia or infarction.  10. Carotid dopplers (6/14): mild disease only.   FH: Prostate CA, HTN  SH: Lives in [place], works part time in a window washing business, married, nonsmoker.   ROS: All systems reviewed and negative except as per HPI.    Current Outpatient Prescriptions  Medication Sig Dispense Refill  . aspirin 81 MG chewable tablet Chew 81 mg by mouth daily.        Marland Kitchen atenolol (TENORMIN) 25 MG tablet TAKE 1 TABLET BY MOUTH DAILY  90 tablet  0  . finasteride (PROSCAR) 5 MG tablet Take 5 mg by mouth daily.        . simvastatin (ZOCOR) 20 MG tablet Take 1 tablet (20 mg total) by mouth daily.  90 tablet  3  . telmisartan (MICARDIS) 80 MG tablet Take 1 tablet (80 mg total) by mouth daily.  90 tablet  3  . VIAGRA 100 MG tablet        No current facility-administered medications for this visit.  BP 134/80  Pulse 74  Ht 6\' 3"  (1.905 m)  Wt 109.317 kg (241 lb)  BMI 30.12 kg/m2 General: NAD, overweight Neck: No JVD, no thyromegaly or thyroid nodule.  Lungs: Clear to auscultation bilaterally with normal respiratory effort. CV: Nondisplaced PMI.  Heart regular S1/S2, widely split S2, no S3/S4, 2/6 early SEM radiating to the neck.  No peripheral edema.  Normal pedal pulses.  Abdomen: Soft, nontender, no hepatosplenomegaly, no distention.  Neurologic: Alert and oriented x 3.  Psych: Normal affect. Extremities: No clubbing or cyanosis.  MSK: Atrophy of left shoulder  muscles  Assessment/Plan: 1.  History of Ross Procedure: Mild AI and mildly elevated bioprosthetic pulmonic valve gradient on echo in 3/15.  Repeat echo in 3/16.  No exertional dyspnea.  2.  Ascending aortic aneurym: This not uncommonly accompanies Ross procedure.  Ascending aorta measured 4.4 cm by echo.  I think that the full thoracic aorta needs to be assessed, so will get MRA chest.  He will continue beta blocker and ARB.  3. HTN: BP is ok today.  Given aneurysm, need to make sure that BP is well-controlled.   [name] 12/11/2013

## 2013-12-14 ENCOUNTER — Encounter: Payer: Self-pay | Admitting: Cardiology

## 2013-12-18 ENCOUNTER — Encounter: Payer: Self-pay | Admitting: Cardiology

## 2013-12-18 ENCOUNTER — Other Ambulatory Visit: Payer: Self-pay | Admitting: Cardiology

## 2013-12-22 ENCOUNTER — Ambulatory Visit (HOSPITAL_COMMUNITY)
Admission: RE | Admit: 2013-12-22 | Discharge: 2013-12-22 | Disposition: A | Discharge status: Home / Self Care | Payer: 59 | Source: Ambulatory Visit | Attending: Cardiology | Admitting: Cardiology

## 2013-12-22 ENCOUNTER — Other Ambulatory Visit: Payer: Self-pay | Admitting: Cardiology

## 2013-12-22 DIAGNOSIS — I712 Thoracic aortic aneurysm, without rupture, unspecified: Secondary | ICD-10-CM | POA: Insufficient documentation

## 2013-12-22 DIAGNOSIS — I7121 Aneurysm of the ascending aorta, without rupture: Secondary | ICD-10-CM

## 2013-12-22 DIAGNOSIS — I359 Nonrheumatic aortic valve disorder, unspecified: Secondary | ICD-10-CM | POA: Diagnosis not present

## 2013-12-22 DIAGNOSIS — I379 Nonrheumatic pulmonary valve disorder, unspecified: Secondary | ICD-10-CM | POA: Diagnosis not present

## 2013-12-22 LAB — CREATININE, SERUM
CREATININE: 0.82 mg/dL (ref 0.50–1.35)
GFR calc non Af Amer: >90 mL/min (ref >90)

## 2013-12-22 MED ORDER — GADOBENATE DIMEGLUMINE 529 MG/ML IV SOLN
35.0000 mL | Freq: Once | INTRAVENOUS | Status: AC
  Administered 2013-12-22: 35 mL via INTRAVENOUS

## 2013-12-27 ENCOUNTER — Encounter: Payer: Self-pay | Admitting: Cardiology

## 2014-01-22 ENCOUNTER — Other Ambulatory Visit: Payer: Self-pay | Admitting: Cardiology

## 2014-01-26 ENCOUNTER — Other Ambulatory Visit: Payer: Self-pay

## 2014-01-26 DIAGNOSIS — I359 Nonrheumatic aortic valve disorder, unspecified: Secondary | ICD-10-CM

## 2014-01-26 DIAGNOSIS — R0989 Other specified symptoms and signs involving the circulatory and respiratory systems: Secondary | ICD-10-CM

## 2014-01-26 DIAGNOSIS — Z952 Presence of prosthetic heart valve: Secondary | ICD-10-CM

## 2014-01-26 MED ORDER — SIMVASTATIN 20 MG PO TABS: 20.0000 mg | ORAL_TABLET | Freq: Every day | ORAL | Status: DC

## 2014-01-30 ENCOUNTER — Encounter: Payer: Self-pay | Admitting: Internal Medicine

## 2014-02-14 ENCOUNTER — Encounter: Payer: Self-pay | Admitting: Family Medicine

## 2014-02-14 ENCOUNTER — Telehealth: Payer: Self-pay | Admitting: Family Medicine

## 2014-02-14 NOTE — Telephone Encounter (Signed)
Caller name: Chrish[place] Relation to [id number] Call back number: [phone] Pharmacy:  Reason for call: Patient states that Dr Risa Grill would like for him to have a PSA done. Patient would like to have that done here. Please advise.

## 2014-02-14 NOTE — Telephone Encounter (Signed)
Make pt a BP follow up appt, we can do the labs then.

## 2014-03-19 ENCOUNTER — Encounter: Payer: 59 | Admitting: Family Medicine

## 2014-03-29 ENCOUNTER — Ambulatory Visit (AMBULATORY_SURGERY_CENTER): Payer: Self-pay

## 2014-03-29 VITALS — Ht 74.25 in | Wt 242.0 lb

## 2014-03-29 DIAGNOSIS — Z1211 Encounter for screening for malignant neoplasm of colon: Secondary | ICD-10-CM

## 2014-03-29 MED ORDER — MOVIPREP 100 G PO SOLR: 1.0000 | Freq: Once | ORAL | Status: DC

## 2014-03-29 NOTE — Progress Notes (Signed)
No allergies to eggs or soy No past problems with anesthesia No diet/weight loss meds No home oxygen  Has email  Emmi instructions given for colonoscopy 

## 2014-03-30 ENCOUNTER — Telehealth: Payer: Self-pay

## 2014-03-30 NOTE — Telephone Encounter (Signed)
Pt called, message left on voicemail that ID'd itself as "[name] [name]", message left was apology but procedure has been cancelled until Dr Effie Shy has opportunity to review records from last procedure and his CMA would call him to reschedule.

## 2014-03-30 NOTE — Telephone Encounter (Signed)
Need records from [facility] (Dr Penelope Coop) of pt's last colonoscopy.  Once records received and reviewed by MD, CMA will reschedule pt.

## 2014-03-30 NOTE — Telephone Encounter (Signed)
Release has been faxed.  Awaiting response

## 2014-04-12 ENCOUNTER — Encounter: Payer: 59 | Admitting: Internal Medicine

## 2014-05-07 LAB — HM COLONOSCOPY: HM Colonoscopy: NORMAL

## 2014-05-14 LAB — HM COLONOSCOPY

## 2014-05-15 NOTE — Telephone Encounter (Signed)
No information given/tmj

## 2014-05-18 ENCOUNTER — Other Ambulatory Visit: Payer: Self-pay

## 2014-05-31 ENCOUNTER — Encounter: Payer: Self-pay | Admitting: Family Medicine

## 2014-05-31 ENCOUNTER — Ambulatory Visit (INDEPENDENT_AMBULATORY_CARE_PROVIDER_SITE_OTHER): Payer: 59 | Admitting: Family Medicine

## 2014-05-31 VITALS — BP 122/82 | HR 79 | Temp 98.0°F | Resp 16 | Ht 74.75 in | Wt 240.1 lb

## 2014-05-31 DIAGNOSIS — G540 Brachial plexus disorders: Secondary | ICD-10-CM | POA: Diagnosis not present

## 2014-05-31 DIAGNOSIS — Z0000 Encounter for general adult medical examination without abnormal findings: Secondary | ICD-10-CM

## 2014-05-31 DIAGNOSIS — I10 Essential (primary) hypertension: Secondary | ICD-10-CM | POA: Diagnosis not present

## 2014-05-31 DIAGNOSIS — Z23 Encounter for immunization: Secondary | ICD-10-CM

## 2014-05-31 DIAGNOSIS — I712 Thoracic aortic aneurysm, without rupture, unspecified: Secondary | ICD-10-CM

## 2014-05-31 DIAGNOSIS — E785 Hyperlipidemia, unspecified: Secondary | ICD-10-CM

## 2014-05-31 LAB — LIPID PANEL
Cholesterol: 118 mg/dL (ref 0–200)
HDL: 28 mg/dL — ABNORMAL LOW (ref >39.00)
NonHDL: 90
Total CHOL/HDL Ratio: 4
Triglycerides: 205 mg/dL — ABNORMAL HIGH (ref 0.0–149.0)
VLDL: 41 mg/dL — ABNORMAL HIGH (ref 0.0–40.0)

## 2014-05-31 LAB — CBC WITH DIFFERENTIAL/PLATELET
BASOS PCT: 0.2 % (ref 0.0–3.0)
Basophils Absolute: 0 10*3/uL (ref 0.0–0.1)
EOS PCT: 1.3 % (ref 0.0–5.0)
Eosinophils Absolute: 0.1 10*3/uL (ref 0.0–0.7)
HCT: 44 % (ref 39.0–52.0)
Hemoglobin: 14.8 g/dL (ref 13.0–17.0)
Lymphocytes Relative: 19 % (ref 12.0–46.0)
Lymphs Abs: 1.7 10*3/uL (ref 0.7–4.0)
MCHC: 33.6 g/dL (ref 30.0–36.0)
MCV: 89.8 fl (ref 78.0–100.0)
MONO ABS: 0.5 10*3/uL (ref 0.1–1.0)
Monocytes Relative: 6 % (ref 3.0–12.0)
Neutro Abs: 6.7 10*3/uL (ref 1.4–7.7)
Neutrophils Relative %: 73.5 % (ref 43.0–77.0)
PLATELETS: 302 10*3/uL (ref 150.0–400.0)
RBC: 4.9 Mil/uL (ref 4.22–5.81)
RDW: 13.2 % (ref 11.5–15.5)
WBC: 9.2 10*3/uL (ref 4.0–10.5)

## 2014-05-31 LAB — BASIC METABOLIC PANEL
BUN: 12 mg/dL (ref 6–23)
CHLORIDE: 106 meq/L (ref 96–112)
CO2: 28 meq/L (ref 19–32)
CREATININE: 0.9 mg/dL (ref 0.4–1.5)
Calcium: 9.3 mg/dL (ref 8.4–10.5)
GFR: 92.1 mL/min (ref >60.00)
Glucose, Bld: 114 mg/dL — ABNORMAL HIGH (ref 70–99)
Potassium: 4.3 mEq/L (ref 3.5–5.1)
SODIUM: 137 meq/L (ref 135–145)

## 2014-05-31 LAB — HEPATIC FUNCTION PANEL
ALT: 25 U/L (ref 0–53)
AST: 23 U/L (ref 0–37)
Albumin: 3.6 g/dL (ref 3.5–5.2)
Alkaline Phosphatase: 64 U/L (ref 39–117)
BILIRUBIN DIRECT: 0.1 mg/dL (ref 0.0–0.3)
BILIRUBIN TOTAL: 0.9 mg/dL (ref 0.2–1.2)
Total Protein: 6.5 g/dL (ref 6.0–8.3)

## 2014-05-31 NOTE — Patient Instructions (Signed)
Follow up in 6 months to recheck BP and cholesterol We'll notify you of your lab results and make any changes if needed Keep up the good work on healthy diet and regular exercise Call with any questions or concerns Happy Holidays!

## 2014-05-31 NOTE — Assessment & Plan Note (Signed)
Chronic problem.  Following w/ Dr Aundra Dubin.  Currently asymptomatic.

## 2014-05-31 NOTE — Assessment & Plan Note (Signed)
Chronic problem.  Tolerating statin w/o difficulty.  Check labs.  Adjust meds prn  

## 2014-05-31 NOTE — Assessment & Plan Note (Signed)
Chronic problem.  Well controlled.  Asymptomatic.  Check labs.  No anticipated med changes. 

## 2014-05-31 NOTE — Progress Notes (Signed)
Pre visit review using our clinic review tool, if applicable. No additional management support is needed unless otherwise documented below in the visit note. 

## 2014-05-31 NOTE — Assessment & Plan Note (Signed)
Pt's PE WNL w/ exception of known L brachial plexus palsy and S4 gallop.  UTD on colonoscopy.  Following w/ Urology.  Written screening schedule updated and given to pt.  Check labs.  Anticipatory guidance provided.

## 2014-05-31 NOTE — Progress Notes (Signed)
   Subjective:    Patient ID: [name] [name], male    DOB: 06-07-1951, 63 y.o.   MRN: [id number]  HPI Here today for CPE.  Risk Factors: HTN- chronic problem, well controlled on Micardis, Atenolol.  No CP, SOB, HAs, visual changes, edema. Hyperlipidemia- chronic problem, on Simvastatin.  Denies abd pain, N/V, myalgias  Brachial plexus palsy- chronic problem, on disability for this Thoracic abdominal aneurysm- chronic problem, following closely w/ Dr Aundra Dubin Physical Activity: regular walking and some golfing Fall Risk: low risk Depression: no current symptoms Hearing: normal to conversational tones and whispered voice at 58ft ADL's: independent Cognitive: normal linear thought process, memory and attention intact Home Safety: safe at home, lives w/ wife Height, Weight, BMI, Visual Acuity: see vitals, vision corrected to 20/20 w/ glasses Counseling: UTD on colonoscopy, following w/ Dr Risa Grill yearly but due for PSA today Labs Ordered: See A&P Care Plan: See A&P    Review of Systems Patient reports no vision/hearing changes, anorexia, fever ,adenopathy, persistant/recurrent hoarseness, swallowing issues, chest pain, palpitations, edema, persistant/recurrent cough, hemoptysis, dyspnea (rest,exertional, paroxysmal nocturnal), gastrointestinal  bleeding (melena, rectal bleeding), abdominal pain, excessive heart burn, GU symptoms (dysuria, hematuria, voiding/incontinence issues) syncope, memory loss, skin/hair/nail changes, depression, anxiety, abnormal bruising/bleeding, musculoskeletal symptoms/signs.   + weakness and numbness of L arm due to brachial plexus palsy    Objective:   Physical Exam General Appearance:    Alert, cooperative, no distress, appears stated age  Head:    Normocephalic, without obvious abnormality, atraumatic  Eyes:    PERRL, conjunctiva/corneas clear, EOM's intact, fundi    benign, both eyes       Ears:    Normal TM's and external ear canals, both ears  Nose:    Nares normal, septum midline, mucosa normal, no drainage   or sinus tenderness  Throat:   Lips, mucosa, and tongue normal; teeth and gums normal  Neck:   Supple, symmetrical, trachea midline, no adenopathy;       thyroid:  No enlargement/tenderness/nodules  Back:     Symmetric, no curvature, ROM normal, no CVA tenderness  Lungs:     Clear to auscultation bilaterally, respirations unlabored  Chest wall:    No tenderness or deformity  Heart:    Regular rate and rhythm, S1 and S2 normal, + S4 gallop  Abdomen:     Soft, non-tender, bowel sounds active all four quadrants,    no masses, no organomegaly  Genitalia:    Deferred to urology  Rectal:    Extremities:   Extremities normal, atraumatic, no cyanosis or edema  Pulses:   2+ and symmetric all extremities  Skin:   Skin color, texture, turgor normal, no rashes or lesions  Lymph nodes:   Cervical, supraclavicular, and axillary nodes normal  Neurologic:   CNII-XII intact. Normal strength, sensation and reflexes      throughout          Assessment & Plan:

## 2014-05-31 NOTE — Assessment & Plan Note (Signed)
Chronic problem.  Unchanged.  Pt w/ ongoing L arm weakness.

## 2014-06-01 LAB — LDL CHOLESTEROL, DIRECT: Direct LDL: 69.8 mg/dL

## 2014-06-01 LAB — PSA: PSA: 3.35 ng/mL (ref 0.10–4.00)

## 2014-06-01 LAB — TSH: TSH: 1.35 u[IU]/mL (ref 0.35–4.50)

## 2014-06-05 ENCOUNTER — Encounter: Payer: Self-pay | Admitting: General Practice

## 2014-07-19 ENCOUNTER — Other Ambulatory Visit: Payer: Self-pay | Admitting: Cardiology

## 2014-09-13 DIAGNOSIS — L814 Other melanin hyperpigmentation: Secondary | ICD-10-CM | POA: Diagnosis not present

## 2014-09-13 DIAGNOSIS — D2271 Melanocytic nevi of right lower limb, including hip: Secondary | ICD-10-CM | POA: Diagnosis not present

## 2014-09-13 DIAGNOSIS — L821 Other seborrheic keratosis: Secondary | ICD-10-CM | POA: Diagnosis not present

## 2014-09-13 DIAGNOSIS — D1800 Hemangioma unspecified site: Secondary | ICD-10-CM | POA: Diagnosis not present

## 2014-09-13 DIAGNOSIS — Z86018 Personal history of other benign neoplasm: Secondary | ICD-10-CM | POA: Diagnosis not present

## 2014-09-13 DIAGNOSIS — L570 Actinic keratosis: Secondary | ICD-10-CM | POA: Diagnosis not present

## 2014-10-16 DIAGNOSIS — N5201 Erectile dysfunction due to arterial insufficiency: Secondary | ICD-10-CM | POA: Diagnosis not present

## 2014-10-16 DIAGNOSIS — N401 Enlarged prostate with lower urinary tract symptoms: Secondary | ICD-10-CM | POA: Diagnosis not present

## 2014-10-16 DIAGNOSIS — N138 Other obstructive and reflux uropathy: Secondary | ICD-10-CM | POA: Diagnosis not present

## 2014-10-24 ENCOUNTER — Other Ambulatory Visit: Payer: Self-pay | Admitting: Cardiology

## 2014-10-29 ENCOUNTER — Telehealth: Payer: Self-pay | Admitting: Internal Medicine

## 2014-11-08 ENCOUNTER — Encounter: Payer: Self-pay | Admitting: Internal Medicine

## 2014-11-08 ENCOUNTER — Ambulatory Visit (INDEPENDENT_AMBULATORY_CARE_PROVIDER_SITE_OTHER): Payer: 59 | Admitting: Internal Medicine

## 2014-11-08 VITALS — BP 128/76 | HR 78 | Temp 98.3°F | Resp 16 | Ht 74.75 in | Wt 237.0 lb

## 2014-11-08 DIAGNOSIS — J45902 Unspecified asthma with status asthmaticus: Secondary | ICD-10-CM | POA: Insufficient documentation

## 2014-11-08 DIAGNOSIS — J4532 Mild persistent asthma with status asthmaticus: Secondary | ICD-10-CM | POA: Diagnosis not present

## 2014-11-08 MED ORDER — METHYLPREDNISOLONE ACETATE 80 MG/ML IJ SUSP
120.0000 mg | Freq: Once | INTRAMUSCULAR | Status: AC
  Administered 2014-11-08: 120 mg via INTRAMUSCULAR

## 2014-11-08 MED ORDER — HYDROCODONE-HOMATROPINE 5-1.5 MG/5ML PO SYRP
5.0000 mL | ORAL_SOLUTION | Freq: Three times a day (TID) | ORAL | Status: DC | PRN
Start: 2014-11-08 — End: 2014-11-29

## 2014-11-08 MED ORDER — FLUTICASONE FUROATE-VILANTEROL 200-25 MCG/INH IN AEPB: 1.0000 | INHALATION_SPRAY | Freq: Every day | RESPIRATORY_TRACT | Status: DC

## 2014-11-08 NOTE — Progress Notes (Signed)
   Subjective:    Patient ID: [name] [name], male    DOB: [birth year], 64 y.o.   MRN: [id number]  Cough This is a recurrent problem. The current episode started 1 to 4 weeks ago. The problem has been unchanged. The problem occurs every few hours. The cough is non-productive. Associated symptoms include wheezing. Pertinent negatives include no chest pain, chills, ear congestion, ear pain, fever, headaches, heartburn, hemoptysis, myalgias, nasal congestion, postnasal drip, rash, rhinorrhea, sore throat, shortness of breath, sweats or weight loss. Nothing aggravates the symptoms. He has tried OTC cough suppressant (zpak) for the symptoms. The treatment provided no relief. His past medical history is significant for environmental allergies. There is no history of asthma, bronchiectasis, bronchitis, COPD, emphysema or pneumonia.      Review of Systems  Constitutional: Negative.  Negative for fever, chills, weight loss, diaphoresis, appetite change and fatigue.  HENT: Negative.  Negative for ear pain, postnasal drip, rhinorrhea, sore throat and trouble swallowing.   Eyes: Negative.   Respiratory: Positive for cough and wheezing. Negative for apnea, hemoptysis, choking, chest tightness, shortness of breath and stridor.   Cardiovascular: Negative.  Negative for chest pain, palpitations and leg swelling.  Gastrointestinal: Negative.  Negative for heartburn, nausea, vomiting, abdominal pain, diarrhea, constipation and blood in stool.  Endocrine: Negative.   Genitourinary: Negative.   Musculoskeletal: Negative.  Negative for myalgias.  Skin: Negative.  Negative for rash.  Allergic/Immunologic: Positive for environmental allergies.  Neurological: Negative.  Negative for headaches.  Hematological: Negative.  Negative for adenopathy. Does not bruise/bleed easily.  Psychiatric/Behavioral: Negative.        Objective:   Physical Exam  Constitutional: He is oriented to person, place, and time. He  appears well-developed and well-nourished.  Non-toxic appearance. He does not have a sickly appearance. He does not appear ill. No distress.  HENT:  Head: Normocephalic and atraumatic.  Mouth/Throat: Oropharynx is clear and moist. No oropharyngeal exudate.  Eyes: Conjunctivae are normal. Right eye exhibits no discharge. Left eye exhibits no discharge. No scleral icterus.  Neck: Normal range of motion. Neck supple. No JVD present. No tracheal deviation present. No thyromegaly present.  Cardiovascular: Normal rate, regular rhythm, normal heart sounds and intact distal pulses.  Exam reveals no gallop and no friction rub.   No murmur heard. Pulmonary/Chest: Effort normal. No accessory muscle usage or stridor. No tachypnea. No respiratory distress. He has no decreased breath sounds. He has wheezes in the right middle field, the right lower field and the left middle field. He has no rhonchi. He has no rales. He exhibits no tenderness.  Abdominal: Soft. Bowel sounds are normal. He exhibits no distension and no mass. There is no tenderness. There is no rebound and no guarding.  Musculoskeletal: Normal range of motion. He exhibits no edema or tenderness.  Lymphadenopathy:    He has no cervical adenopathy.  Neurological: He is oriented to person, place, and time.  Skin: Skin is warm and dry. No rash noted. He is not diaphoretic. No erythema. No pallor.  Vitals reviewed.         Assessment & Plan:

## 2014-11-08 NOTE — Assessment & Plan Note (Signed)
He is having a flare up of this so I gave him an injection of depo-medrol to reduce the inflammation and wheezing He will start using breo - I gave him a sample and showed him how to use it and he demonstrated proficiency with its use He will take a cough suppressant as well

## 2014-11-08 NOTE — Progress Notes (Signed)
Pre visit review using our clinic review tool, if applicable. No additional management support is needed unless otherwise documented below in the visit note. 

## 2014-11-16 ENCOUNTER — Other Ambulatory Visit: Payer: Self-pay | Admitting: Cardiology

## 2014-11-19 ENCOUNTER — Telehealth: Payer: Self-pay | Admitting: Internal Medicine

## 2014-11-19 DIAGNOSIS — J4532 Mild persistent asthma with status asthmaticus: Secondary | ICD-10-CM

## 2014-11-19 MED ORDER — FLUTICASONE FUROATE-VILANTEROL 200-25 MCG/INH IN AEPB: 1.0000 | INHALATION_SPRAY | Freq: Every day | RESPIRATORY_TRACT | Status: DC

## 2014-11-19 NOTE — Telephone Encounter (Signed)
Patient was prescribed Fluticasone Furoate-Vilanterol (BREO ELLIPTA) 200-25 MCG/INH AEPB [[id number]] and it has worked tremendously. He is wondering how long he should keep using it.

## 2014-11-19 NOTE — Telephone Encounter (Signed)
There is an RX at his pharmacy for this It is long term

## 2014-11-19 NOTE — Telephone Encounter (Signed)
Pt advised that this is for long term use and pharmacy has more refills per dr Ronnald Ramp note

## 2014-11-29 ENCOUNTER — Ambulatory Visit (INDEPENDENT_AMBULATORY_CARE_PROVIDER_SITE_OTHER): Payer: 59 | Admitting: Family Medicine

## 2014-11-29 ENCOUNTER — Ambulatory Visit: Payer: 59 | Admitting: Family Medicine

## 2014-11-29 ENCOUNTER — Encounter: Payer: Self-pay | Admitting: Family Medicine

## 2014-11-29 VITALS — BP 120/74 | HR 78 | Temp 98.3°F | Resp 16 | Wt 235.1 lb

## 2014-11-29 DIAGNOSIS — E785 Hyperlipidemia, unspecified: Secondary | ICD-10-CM | POA: Diagnosis not present

## 2014-11-29 DIAGNOSIS — I10 Essential (primary) hypertension: Secondary | ICD-10-CM

## 2014-11-29 LAB — LIPID PANEL
CHOL/HDL RATIO: 4
Cholesterol: 124 mg/dL (ref 0–200)
HDL: 34.3 mg/dL — ABNORMAL LOW (ref >39.00)
LDL CALC: 51 mg/dL (ref 0–99)
NonHDL: 89.7
Triglycerides: 193 mg/dL — ABNORMAL HIGH (ref 0.0–149.0)
VLDL: 38.6 mg/dL (ref 0.0–40.0)

## 2014-11-29 LAB — CBC WITH DIFFERENTIAL/PLATELET
Basophils Absolute: 0 10*3/uL (ref 0.0–0.1)
Basophils Relative: 0.3 % (ref 0.0–3.0)
EOS ABS: 0.1 10*3/uL (ref 0.0–0.7)
Eosinophils Relative: 0.7 % (ref 0.0–5.0)
HCT: 42.7 % (ref 39.0–52.0)
Hemoglobin: 14.6 g/dL (ref 13.0–17.0)
Lymphocytes Relative: 16.2 % (ref 12.0–46.0)
Lymphs Abs: 2 10*3/uL (ref 0.7–4.0)
MCHC: 34.1 g/dL (ref 30.0–36.0)
MCV: 88.6 fl (ref 78.0–100.0)
MONO ABS: 0.7 10*3/uL (ref 0.1–1.0)
Monocytes Relative: 5.4 % (ref 3.0–12.0)
NEUTROS PCT: 77.4 % — AB (ref 43.0–77.0)
Neutro Abs: 9.6 10*3/uL — ABNORMAL HIGH (ref 1.4–7.7)
PLATELETS: 273 10*3/uL (ref 150.0–400.0)
RBC: 4.82 Mil/uL (ref 4.22–5.81)
RDW: 13.9 % (ref 11.5–15.5)
WBC: 12.4 10*3/uL — AB (ref 4.0–10.5)

## 2014-11-29 LAB — BASIC METABOLIC PANEL
BUN: 19 mg/dL (ref 6–23)
CO2: 24 mEq/L (ref 19–32)
CREATININE: 0.8 mg/dL (ref 0.40–1.50)
Calcium: 9.4 mg/dL (ref 8.4–10.5)
Chloride: 106 mEq/L (ref 96–112)
GFR: 103.98 mL/min (ref >60.00)
Glucose, Bld: 158 mg/dL — ABNORMAL HIGH (ref 70–99)
POTASSIUM: 3.7 meq/L (ref 3.5–5.1)
Sodium: 136 mEq/L (ref 135–145)

## 2014-11-29 LAB — HEPATIC FUNCTION PANEL
ALBUMIN: 4.2 g/dL (ref 3.5–5.2)
ALT: 27 U/L (ref 0–53)
AST: 18 U/L (ref 0–37)
Alkaline Phosphatase: 63 U/L (ref 39–117)
Bilirubin, Direct: 0.1 mg/dL (ref 0.0–0.3)
TOTAL PROTEIN: 6.2 g/dL (ref 6.0–8.3)
Total Bilirubin: 0.7 mg/dL (ref 0.2–1.2)

## 2014-11-29 NOTE — Assessment & Plan Note (Signed)
Chronic problem, excellent control.  Asymptomatic.  Check labs.  No anticipated med changes.

## 2014-11-29 NOTE — Progress Notes (Signed)
Pre visit review using our clinic review tool, if applicable. No additional management support is needed unless otherwise documented below in the visit note. 

## 2014-11-29 NOTE — Assessment & Plan Note (Signed)
Chronic problem.  Tolerating statin w/o difficulty.  Check labs.  Adjust meds prn  

## 2014-11-29 NOTE — Patient Instructions (Signed)
Schedule your complete physical in 6 months We'll notify you of your lab results and make any changes if needed Keep up the good work!  You look great! Call with any questions or concerns Have a great time at the beach!!!

## 2014-11-29 NOTE — Progress Notes (Signed)
   Subjective:    Patient ID: [name] [name], male    DOB: 02-23-1951, 64 y.o.   MRN: [id number]  HPI HTN- chronic problem, on Micardis, Atenolol.  Well controlled.  No CP, SOB, HAs, visual changes, edema.  Hyperlipidemia- chronic problem, on Simvastatin.  No abd pain, N/V, myalgias.   Review of Systems For ROS see HPI     Objective:   Physical Exam  Constitutional: He is oriented to person, place, and time. He appears well-developed and well-nourished. No distress.  HENT:  Head: Normocephalic and atraumatic.  Eyes: Conjunctivae and EOM are normal. Pupils are equal, round, and reactive to light.  Neck: Normal range of motion. Neck supple. No thyromegaly present.  Cardiovascular: Normal rate, regular rhythm and intact distal pulses.   Prosthetic aortic valve   Pulmonary/Chest: Effort normal and breath sounds normal. No respiratory distress.  Abdominal: Soft. Bowel sounds are normal. He exhibits no distension.  Musculoskeletal: He exhibits no edema.  Lymphadenopathy:    He has no cervical adenopathy.  Neurological: He is alert and oriented to person, place, and time. No cranial nerve deficit.  Skin: Skin is warm and dry.  Psychiatric: He has a normal mood and affect. His behavior is normal.  Vitals reviewed.         Assessment & Plan:

## 2014-11-30 ENCOUNTER — Other Ambulatory Visit: Payer: Self-pay | Admitting: Family Medicine

## 2014-11-30 ENCOUNTER — Other Ambulatory Visit (INDEPENDENT_AMBULATORY_CARE_PROVIDER_SITE_OTHER): Payer: 59

## 2014-11-30 DIAGNOSIS — R7309 Other abnormal glucose: Secondary | ICD-10-CM | POA: Diagnosis not present

## 2014-11-30 DIAGNOSIS — R7303 Prediabetes: Secondary | ICD-10-CM

## 2014-11-30 DIAGNOSIS — D72829 Elevated white blood cell count, unspecified: Secondary | ICD-10-CM

## 2014-11-30 LAB — HEMOGLOBIN A1C: Hgb A1c MFr Bld: 6 % (ref 4.6–6.5)

## 2014-12-10 ENCOUNTER — Other Ambulatory Visit (HOSPITAL_COMMUNITY): Payer: 59

## 2014-12-10 ENCOUNTER — Ambulatory Visit (HOSPITAL_COMMUNITY): Payer: 59 | Attending: Cardiovascular Disease

## 2014-12-10 ENCOUNTER — Other Ambulatory Visit: Payer: Self-pay

## 2014-12-10 DIAGNOSIS — E785 Hyperlipidemia, unspecified: Secondary | ICD-10-CM | POA: Diagnosis not present

## 2014-12-10 DIAGNOSIS — I10 Essential (primary) hypertension: Secondary | ICD-10-CM | POA: Diagnosis not present

## 2014-12-10 DIAGNOSIS — I359 Nonrheumatic aortic valve disorder, unspecified: Secondary | ICD-10-CM | POA: Diagnosis not present

## 2014-12-10 DIAGNOSIS — I351 Nonrheumatic aortic (valve) insufficiency: Secondary | ICD-10-CM | POA: Diagnosis not present

## 2014-12-10 DIAGNOSIS — I712 Thoracic aortic aneurysm, without rupture: Secondary | ICD-10-CM | POA: Diagnosis not present

## 2014-12-10 DIAGNOSIS — K222 Esophageal obstruction: Secondary | ICD-10-CM | POA: Insufficient documentation

## 2014-12-10 DIAGNOSIS — I7121 Aneurysm of the ascending aorta, without rupture: Secondary | ICD-10-CM

## 2014-12-10 NOTE — Progress Notes (Signed)
2D Echo completed. 12/10/2014

## 2014-12-11 ENCOUNTER — Other Ambulatory Visit: Payer: Self-pay | Admitting: Cardiology

## 2014-12-12 ENCOUNTER — Telehealth: Payer: Self-pay | Admitting: Cardiology

## 2014-12-12 NOTE — Telephone Encounter (Signed)
Provided patient with results from recent ECHO and notations from MD. Patient verbalized understanding and appreciation for call. Patient would like to cancel upcoming appt with extended provider in light of the ECHO results, as that was why he had the appointment (to review results). Will route to Richardson Dopp, [place], to see if patient can cancel appointment.

## 2014-12-12 NOTE — Telephone Encounter (Signed)
Appointment with Richardson Dopp, PA, cancelled. Patient will keep the June appointment with Dr. Aundra Dubin.

## 2014-12-12 NOTE — Telephone Encounter (Signed)
Follow Up    Pt calling regarding test results. Please call.

## 2014-12-12 NOTE — Telephone Encounter (Signed)
That's fine, I'll see him in June.

## 2014-12-12 NOTE — Telephone Encounter (Signed)
I have never seen him. I suppose the echo was a routine FU study and the appointment with me was for routine FU. If has an appointment with Dr. Loralie Champagne next month, keep that appointment and see him then. Richardson Dopp, PA-C   12/12/2014 2:22 PM

## 2014-12-13 ENCOUNTER — Ambulatory Visit: Payer: Medicare Other | Admitting: Physician Assistant

## 2014-12-13 NOTE — Telephone Encounter (Signed)
Note 12/11/13

## 2014-12-17 ENCOUNTER — Other Ambulatory Visit (INDEPENDENT_AMBULATORY_CARE_PROVIDER_SITE_OTHER): Payer: 59

## 2014-12-17 DIAGNOSIS — D72829 Elevated white blood cell count, unspecified: Secondary | ICD-10-CM

## 2014-12-17 LAB — CBC WITH DIFFERENTIAL/PLATELET
BASOS PCT: 0.4 % (ref 0.0–3.0)
Basophils Absolute: 0 10*3/uL (ref 0.0–0.1)
EOS ABS: 0.1 10*3/uL (ref 0.0–0.7)
EOS PCT: 1.2 % (ref 0.0–5.0)
HCT: 44.9 % (ref 39.0–52.0)
HEMOGLOBIN: 15.3 g/dL (ref 13.0–17.0)
Lymphocytes Relative: 23.7 % (ref 12.0–46.0)
Lymphs Abs: 2.2 10*3/uL (ref 0.7–4.0)
MCHC: 34.1 g/dL (ref 30.0–36.0)
MCV: 88.5 fl (ref 78.0–100.0)
Monocytes Absolute: 0.4 10*3/uL (ref 0.1–1.0)
Monocytes Relative: 4.6 % (ref 3.0–12.0)
NEUTROS ABS: 6.4 10*3/uL (ref 1.4–7.7)
NEUTROS PCT: 70.1 % (ref 43.0–77.0)
Platelets: 292 10*3/uL (ref 150.0–400.0)
RBC: 5.08 Mil/uL (ref 4.22–5.81)
RDW: 13.7 % (ref 11.5–15.5)
WBC: 9.1 10*3/uL (ref 4.0–10.5)

## 2014-12-26 ENCOUNTER — Ambulatory Visit: Payer: Medicare Other | Admitting: Cardiology

## 2015-01-15 ENCOUNTER — Other Ambulatory Visit: Payer: Self-pay | Admitting: Cardiology

## 2015-01-30 ENCOUNTER — Ambulatory Visit (INDEPENDENT_AMBULATORY_CARE_PROVIDER_SITE_OTHER): Payer: 59 | Admitting: Cardiology

## 2015-01-30 ENCOUNTER — Encounter: Payer: Self-pay | Admitting: Cardiology

## 2015-01-30 VITALS — BP 138/72 | HR 71 | Ht 74.75 in | Wt 234.0 lb

## 2015-01-30 DIAGNOSIS — I712 Thoracic aortic aneurysm, without rupture, unspecified: Secondary | ICD-10-CM

## 2015-01-30 DIAGNOSIS — Z954 Presence of other heart-valve replacement: Secondary | ICD-10-CM | POA: Diagnosis not present

## 2015-01-30 DIAGNOSIS — I359 Nonrheumatic aortic valve disorder, unspecified: Secondary | ICD-10-CM

## 2015-01-30 DIAGNOSIS — I10 Essential (primary) hypertension: Secondary | ICD-10-CM

## 2015-01-30 DIAGNOSIS — I77810 Thoracic aortic ectasia: Secondary | ICD-10-CM

## 2015-01-30 MED ORDER — ATENOLOL 50 MG PO TABS: 50.0000 mg | ORAL_TABLET | Freq: Every day | ORAL | Status: DC

## 2015-01-30 NOTE — Patient Instructions (Signed)
Medication Instructions:  Increase atenolol to 50mg  daily  Labwork: None today.  Testing/Procedures: Schedule an appointment for an MRA of your chest.  Follow-Up: Your physician wants you to follow-up in: 1 year with Dr Aundra Dubin. (June 2017).  You will receive a reminder letter in the mail two months in advance. If you don't receive a letter, please call our office to schedule the follow-up appointment.   Thank you for choosing [facility]!!

## 2015-01-31 ENCOUNTER — Other Ambulatory Visit (INDEPENDENT_AMBULATORY_CARE_PROVIDER_SITE_OTHER): Payer: 59 | Admitting: *Deleted

## 2015-01-31 DIAGNOSIS — I712 Thoracic aortic aneurysm, without rupture: Secondary | ICD-10-CM

## 2015-01-31 DIAGNOSIS — I359 Nonrheumatic aortic valve disorder, unspecified: Secondary | ICD-10-CM

## 2015-01-31 DIAGNOSIS — Z954 Presence of other heart-valve replacement: Secondary | ICD-10-CM | POA: Insufficient documentation

## 2015-01-31 DIAGNOSIS — I77810 Thoracic aortic ectasia: Secondary | ICD-10-CM

## 2015-01-31 LAB — BASIC METABOLIC PANEL WITH GFR
BUN: 21 mg/dL (ref 6–23)
CO2: 25 meq/L (ref 19–32)
Calcium: 9.2 mg/dL (ref 8.4–10.5)
Chloride: 107 meq/L (ref 96–112)
Creatinine, Ser: 0.86 mg/dL (ref 0.40–1.50)
GFR: 95.6 mL/min
Glucose, Bld: 118 mg/dL — ABNORMAL HIGH (ref 70–99)
Potassium: 4 meq/L (ref 3.5–5.1)
Sodium: 139 meq/L (ref 135–145)

## 2015-01-31 NOTE — Progress Notes (Signed)
Patient ID: [name] [name], male   DOB: 1950-11-21, 64 y.o.   MRN: [id number] PCP: Dr. Birdie Riddle  64 y.o. with history of aortic insufficiency s/p Ross procedure presents for cardiology followup.  He has a left-sided brachial plexopathy of uncertain etiology and has atrophy of his left shoulder and upper arm.  He has developed weakness in his right hand as well as occasional vertigo-type spells.  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    He has been stable symptomatically.  No exertional dyspnea or chest pain. He walks and plays golf for exercise.  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  Weight is down 7 lbs.  Most recent echo in 5/16 showed mild AI and no AS of autograft in aortic position, bioprosthetic pulmonic valve with trivial pulmonic stenosis.     Labs (2/14): K 3.7, creatinine 0.9, LDL 63, HDL 29 Labs (3/14): BNP 36 Labs (2/15): K 3.7, creatinine 0.8, LDL 61, HDL 30 Labs (4/16): K 3.7, creatinine 0.8, LDL 51, HDL 34 Labs (5/16): HCT 44.9  ECG: NSR, RBBB  PMH: 1. Aortic insufficiency: s/p Ross procedure in 1994 in [place].  Echo (4/11) with hypokinetic basal septum (likely post-surgical), EF 50%, mild LVH, s/p Ross procedure with native pulmonic valve in aortic position with mild aortic insufficiency and bioprosthetic pulmonic valve with no pulmonic insufficiency, mild MR, aortic upper normal in size.  Echo (4/13) with EF 55%, mild LVH, mild AI, mild MR, mild RV dilation, bioprosthetic pulmonic valve with peak pressure 25 mmHg.  Echo (3/14): Mild LV dilation, mild LVH, EF 60%, pulmonary valve in aortic position (s/p Ross) with mild AI and mildly dilated ascending aorta to 4.3 cm, mildly dilated  RV with normal systolic function, bioprosthetic PVR with mean gradient 23 mmHg.  Echo (3/15) with EF 55-60%, mild LVH, mild AI with no AS, bioprosthetic pulmonic valve with peak gradient 31 mmHg, ascending aorta 4.4 cm.  Cardiac MRI/MRA chest (5/15) with mild regurgitation of autograft aortic valve and mild regurgitation of homograft pulmonic valve, dilation of aortic root at sinuses of valsalva (4.8 cm), EF 6%, no LGE, mild RV dilation with normal RV systolic function.  Echo (5/16) with EF 55-60%, mild autograft AI, mild MR, mildly decreased RV systolic function, biatrial enlargement, RVSP 35 mmHg, trivially increased gradient across bioprosthetic pulmonic valve (no progression).  2. Post-operative atrial fibrillation after heart surgery.  3. Normal left heart cath prior to 1994 heart surgery.  4. Left brachial plexopathy with left shoulder muscle atrophy.  Uncertain etiology.  5. BPH 6. Hyperlipidemia.  7. Cervical spinal stenosis with radiculopathy.   8. 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  9. ETT-Sestamibi (3/14): No ischemia or infarction.  10. Carotid dopplers (6/14): mild disease only.   FH: Prostate CA, HTN  SH: Lives in [place], works part time in a window washing business, married, nonsmoker.   ROS: All systems reviewed and negative except as per HPI.    Current Outpatient Prescriptions  Medication Sig Dispense Refill  . aspirin 81 MG chewable tablet Chew 81 mg by mouth daily.      . finasteride (PROSCAR) 5 MG tablet  Take 5 mg by mouth daily.      . simvastatin (ZOCOR) 20 MG tablet TAKE 1 TABLET BY MOUTH EVERY DAY 90 tablet 0  . telmisartan (MICARDIS) 80 MG tablet TAKE 1 TABLET BY MOUTH DAILY 90 tablet 1  . VIAGRA 100 MG tablet     . atenolol (TENORMIN) 50 MG tablet Take 1 tablet (50 mg total) by mouth daily. 90 tablet 3   No current facility-administered medications for this visit.    BP 138/72 mmHg   Pulse 71  Ht 6' 2.75" (1.899 m)  Wt 234 lb (106.142 kg)  BMI 29.43 kg/m2 General: NAD, overweight Neck: No JVD, no thyromegaly or thyroid nodule.  Lungs: Clear to auscultation bilaterally with normal respiratory effort. CV: Nondisplaced PMI.  Heart regular S1/S2, widely split S2, no S3/S4, 2/6 early SEM radiating to the neck.  No peripheral edema.  Normal pedal pulses.  Abdomen: Soft, nontender, no hepatosplenomegaly, no distention.  Neurologic: Alert and oriented x 3.  Psych: Normal affect. Extremities: No clubbing or cyanosis.  MSK: Atrophy of left shoulder muscles  Assessment/Plan: 1.  History of Ross Procedure: Mild AI and trivially elevated bioprosthetic pulmonic valve gradient on echo in 5/16.  Repeat echo in 6/16.  No exertional dyspnea.  2.  Aortic root aneurym: This not uncommonly accompanies Ross procedure.  Aortic root at sinuses of valsalva measured 4.8 cm by MRA in 5/15.  Echo in 5/16 did not visualized the aortic root well. - MRA chest to follow root dilation.  - Increase atenolol to 50 mg daily and continue ARB.  3. HTN: Given aneurysm, need to make sure that BP is well-controlled. As above, increase atenolol to 50 mg daily.   Loralie Champagne 01/31/2015

## 2015-02-12 ENCOUNTER — Ambulatory Visit (HOSPITAL_COMMUNITY)
Admission: RE | Admit: 2015-02-12 | Discharge: 2015-02-12 | Disposition: A | Discharge status: Home / Self Care | Payer: 59 | Source: Ambulatory Visit | Attending: Cardiology | Admitting: Cardiology

## 2015-02-12 DIAGNOSIS — Z48812 Encounter for surgical aftercare following surgery on the circulatory system: Secondary | ICD-10-CM | POA: Diagnosis not present

## 2015-02-12 DIAGNOSIS — I359 Nonrheumatic aortic valve disorder, unspecified: Secondary | ICD-10-CM | POA: Diagnosis not present

## 2015-02-12 DIAGNOSIS — I719 Aortic aneurysm of unspecified site, without rupture: Secondary | ICD-10-CM | POA: Diagnosis not present

## 2015-02-12 DIAGNOSIS — I77810 Thoracic aortic ectasia: Secondary | ICD-10-CM

## 2015-02-12 DIAGNOSIS — I712 Thoracic aortic aneurysm, without rupture: Secondary | ICD-10-CM | POA: Diagnosis not present

## 2015-02-12 MED ORDER — GADOBENATE DIMEGLUMINE 529 MG/ML IV SOLN
20.0000 mL | Freq: Once | INTRAVENOUS | Status: AC | PRN
  Administered 2015-02-12: 20 mL via INTRAVENOUS

## 2015-02-14 ENCOUNTER — Other Ambulatory Visit: Payer: Self-pay | Admitting: *Deleted

## 2015-02-14 DIAGNOSIS — I712 Thoracic aortic aneurysm, without rupture, unspecified: Secondary | ICD-10-CM

## 2015-02-14 DIAGNOSIS — I719 Aortic aneurysm of unspecified site, without rupture: Secondary | ICD-10-CM

## 2015-02-14 DIAGNOSIS — I7121 Aneurysm of the ascending aorta, without rupture: Secondary | ICD-10-CM

## 2015-02-20 ENCOUNTER — Encounter: Payer: Medicare Other | Admitting: Cardiothoracic Surgery

## 2015-03-14 ENCOUNTER — Encounter: Payer: Self-pay | Admitting: Cardiothoracic Surgery

## 2015-03-14 ENCOUNTER — Institutional Professional Consult (permissible substitution) (INDEPENDENT_AMBULATORY_CARE_PROVIDER_SITE_OTHER): Payer: 59 | Admitting: Cardiothoracic Surgery

## 2015-03-14 VITALS — BP 111/72 | HR 68 | Resp 20 | Ht 74.75 in | Wt 229.0 lb

## 2015-03-14 DIAGNOSIS — I351 Nonrheumatic aortic (valve) insufficiency: Secondary | ICD-10-CM

## 2015-03-14 DIAGNOSIS — I712 Thoracic aortic aneurysm, without rupture: Secondary | ICD-10-CM

## 2015-03-14 DIAGNOSIS — I7121 Aneurysm of the ascending aorta, without rupture: Secondary | ICD-10-CM

## 2015-03-14 NOTE — Progress Notes (Addendum)
[address]       [place] [address]             [phone]                    Anthon F Kishi [place] Medical Record #[id number] Date of Birth: [birth year]  Referring: [name], MD Primary Care: Annye Asa, MD  Chief Complaint:    Chief Complaint  Patient presents with  . Thoracic Aortic Aneurysm    Surgical eval, MRA Chest 02/12/15, ECHO 12/10/2014    History of Present Illness:    [name] [name] 64 y.o. male is seen in the office  today for  Follow up after Ross procedure done in [facility]   for congenital aortic insufficiency. Currently the patient is asymptomatic from the standpoint of heart failure. He denies angina, PND, pedal edema or dyspnea on exertion. He has been noted to have dilatation of his aortic root at the sinuses of Valsalva following his Ross procedure. Reports from echocardiogram in 2003 measured aortic root at 4.9 cm, 2006 4.7 cm, recent MRI 5.1. The patient's mid ascending aorta which is partially native aorta and partial prosthetic graft is not significantly dilated.         1. Aortic insufficiency: s/p Ross procedure in 1994 in [place]. Aortic insufficiency: Echo (4/11) with hypokinetic basal septum (likely post-surgical), EF 50%, mild LVH, s/p Ross procedure with native pulmonic valve in aortic position with mild aortic insufficiency and bioprosthetic pulmonic valve with no pulmonic insufficiency, mild MR, aortic upper normal in size. Echo (4/13) with EF 55%, mild LVH, mild AI, mild MR, mild RV dilation, bioprosthetic pulmonic valve with peak pressure 25 mmHg. Echo (3/14): Mild LV dilation, mild LVH, EF 60%, pulmonary valve in aortic position (s/p Ross) with mild AI and mildly dilated ascending aorta to 4.3 cm, mildly dilated RV with normal systolic function, bioprosthetic PVR with mean gradient 23 mmHg. Echo (3/15) with EF 55-60%, mild LVH, mild AI with no AS, bioprosthetic pulmonic valve with peak gradient 31 mmHg,  ascending aorta 4.4 cm. Cardiac MRI/MRA chest (5/15) with mild regurgitation of autograft aortic valve and mild regurgitation of homograft pulmonic valve, dilation of aortic root at sinuses of valsalva (4.8 cm), EF 6%, no LGE, mild RV dilation with normal RV systolic function. Echo (5/16) with EF 55-60%, mild autograft AI, mild MR, mildly decreased RV systolic function, biatrial enlargement, RVSP 35 mmHg, trivially increased gradient across bioprosthetic pulmonic valve (no progression).  2. Post-operative atrial fibrillation after heart surgery.  3. Normal left heart cath prior to 1994 heart surgery.  4. Left brachial plexopathy with left shoulder muscle atrophy. Uncertain etiology.  5. BPH 6. Hyperlipidemia.  7. Cervical spinal stenosis with radiculopathy.  8. 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  9. ETT-Sestamibi (3/14): No ischemia or infarction.  10. Carotid dopplers (6/14): mild disease only.      Current Activity/ Functional Status:  Patient is independent with mobility/ambulation, transfers, ADL's, IADL's.   Zubrod Score: At the time of surgery this patient's most appropriate activity status/level should be described as: [x]     0    Normal activity, no symptoms []     1    Restricted in physical strenuous activity but ambulatory, able to do out light work []     2    Ambulatory and capable of self care, unable to do  work activities, up and about               >50 % of waking hours                              []     3    Only limited self care, in bed greater than 50% of waking hours []     4    Completely disabled, no self care, confined to bed or chair []     5    Moribund   Past Medical History  Diagnosis Date  . Acute gastritis   . Hiatal hernia   . GERD (gastroesophageal reflux disease)   . Esophageal stricture   . Early satiety   . HTN (hypertension)   . Hyperlipidemia   . Pain, dental    . Aortic insufficiency     s/p aortic valve replacement in 1994  . Neuropathy     left brachial plexus    Past Surgical History  Procedure Laterality Date  . Hiatal hernia repair  DEC 2002  . Tonsillectomy    . Aortic valve replacement  1994  . Vasectomy  DEC 2002  . Ganglion cyst excision  JAN 2002    Family History  Problem Relation Age of Onset  . Colon cancer Neg Hx   . Pancreatic cancer Neg Hx   . Rectal cancer Neg Hx   . Stomach cancer Neg Hx     Social History   Social History  . Marital Status: Married    Spouse Name: N/A  . Number of Children: 3  . Years of Education: N/A   Occupational History  . Not on file.   Social History Main Topics  . Smoking status: Never Smoker   . Smokeless tobacco: Never Used  . Alcohol Use: No  . Drug Use: No  . Sexual Activity: Not on file   Other Topics Concern  . Not on file   Social History Narrative    History  Smoking status  . Never Smoker   Smokeless tobacco  . Never Used    History  Alcohol Use No     Allergies  Allergen Reactions  . Penicillins Hives and Swelling  . Quinidine Other (See Comments)    Increase heart beat per patient    Current Outpatient Prescriptions  Medication Sig Dispense Refill  . aspirin 81 MG chewable tablet Chew 81 mg by mouth daily.      Marland Kitchen atenolol (TENORMIN) 50 MG tablet Take 1 tablet (50 mg total) by mouth daily. 90 tablet 3  . finasteride (PROSCAR) 5 MG tablet Take 5 mg by mouth daily.      . simvastatin (ZOCOR) 20 MG tablet TAKE 1 TABLET BY MOUTH EVERY DAY 90 tablet 0  . telmisartan (MICARDIS) 80 MG tablet TAKE 1 TABLET BY MOUTH DAILY 90 tablet 1  . VIAGRA 100 MG tablet      No current facility-administered medications for this visit.      Review of Systems:     Cardiac Review of Systems: Y or N  Chest Pain [n    ]  Resting SOB [n   ] Exertional SOB  [n  ]  Orthopnea [ n ]   Pedal Edema [ n  ]    Palpitations [ n ] Syncope  [ n ]   Presyncope [ n   ]  General Review of Systems: [Y] = yes [  ]=  no Constitional: recent weight change [n  ];  Wt loss over the last 3 months [   ] anorexia [  ]; fatigue [n  ]; nausea [  ]; night sweats [ n ]; fever [n  ]; or chills [  ];          Dental: poor dentition[n  ]; Last Dentist visit:   Eye : blurred vision [  ]; diplopia [   ]; vision changes [  ];  Amaurosis fugax[  ]; Resp: cough [  ];  wheezing[  ];  hemoptysis[  ]; shortness of breath[  ]; paroxysmal nocturnal dyspnea[  ]; dyspnea on exertion[  ]; or orthopnea[  ];  GI:  gallstones[  ], vomiting[  ];  dysphagia[  ]; melena[  ];  hematochezia [  ]; heartburn[  ];   Hx of  Colonoscopy[  ]; GU: kidney stones [  ]; hematuria[  ];   dysuria [  ];  nocturia[  ];  history of     obstruction [  ]; urinary frequency [  ]             Skin: rash, swelling[  ];, hair loss[  ];  peripheral edema[  ];  or itching[  ]; Musculosketetal: myalgias[  ];  joint swelling[  ];  joint erythema[  ];  joint pain[  ];  back pain[  ];  Heme/Lymph: bruising[  ];  bleeding[  ];  anemia[  ];  Neuro: TIA[transient rt eye blindness evaluated by neurology  ];  headaches[  ];  stroke[  ];  vertigo[  ];  seizures[  ];   paresthesias[  ];  difficulty walking[  ];  Psych:depression[ n ]; anxiety[  ];  Endocrine: diabetes[  ];  thyroid dysfunction[  ];  Immunizations: Flu up to date [ y ]; Pneumococcal up to date [ y ];  Other:  Physical Exam: BP 111/72 mmHg  Pulse 68  Resp 20  Ht 6' 2.75" (1.899 m)  Wt 229 lb (103.874 kg)  BMI 28.80 kg/m2  SpO2 96%  PHYSICAL EXAMINATION: General appearance: alert, cooperative, appears stated age and no distress Head: Normocephalic, without obvious abnormality, atraumatic Neck: no adenopathy, no carotid bruit, no JVD, supple, symmetrical, trachea midline and thyroid not enlarged, symmetric, no tenderness/mass/nodules Lymph nodes: Cervical, supraclavicular, and axillary nodes normal. Resp: clear to auscultation bilaterally Back: symmetric, no  curvature. ROM normal. No CVA tenderness. Cardio: regular rate and rhythm, S1,  widely split S2, no S3, 2/6 early SEM radiating to the neck.  GI: soft, non-tender; bowel sounds normal; no masses,  no organomegaly Extremities: extremities normal, atraumatic, no cyanosis or edema and Homans sign is negative, no sign of DVT, left should atrophy, plexus inj Neurologic: Alert and oriented X 3, normal strength and tone. Normal symmetric reflexes. Normal coordination and gait  Diagnostic Studies & Laboratory data:     Recent Radiology Findings:   Mr Angiogram Chest W Wo Contrast  02/13/2015   CLINICAL DATA:  Status post cross procedure with dilatation of the aortic root by prior imaging.  EXAM: MRA CHEST WITH OR WITHOUT CONTRAST  TECHNIQUE: Angiographic images of the chest were obtained using MRA technique without and with intravenous contrast.  CONTRAST:  60mL MULTIHANCE GADOBENATE DIMEGLUMINE 529 MG/ML IV SOLN  COMPARISON:  12/22/2013  FINDINGS: Dilatation of the pulmonary autograft at the level of the sinuses of Valsalva appears more prominent compared to the prior study. Maximal diameter when measuring into the left cusp  is approximately 5.4- 5.5 cm. Diameter measuring across into the right cusp is approximately 5.1 cm. Diameter measuring into the non coronary cusp is approximately 5 cm. Sino-tubular junction measures approximately 4.3 cm. Anastomosis to the native descending thoracic aorta measures approximately 2.5 cm. The ascending thoracic aorta measures approximately 3.6 cm. The aortic arch measures approximately 3 cm. The descending thoracic aorta measures 2.9 cm.  There is no evidence of aortic dissection or intramural hemorrhage. Caliber of the pulmonary arteries appears stable. Cardiac chamber sizes are grossly stable. Patent bilateral pulmonary veins demonstrated with normal pulmonary venous anatomy. The left atrial appendage shows normal patency. Visualized proximal great vessels are normally  patent and show normal branching anatomy. No pleural fluid is visualized. The pericardium appears unremarkable. No evidence of adenopathy or abnormal fluid collections.  IMPRESSION: Interval increased dimensions at the level of the sinuses of Valsalva compared to the prior MRA study. Maximal diameter is now approximately 5.4-5.5 cm. No associated evidence of aortic dissection.   Electronically Signed   By: Aletta Edouard M.D.   On: 02/13/2015 09:23     I have independently reviewed the above radiologic studies.  Recent Lab Findings: Lab Results  Component Value Date   WBC 9.1 12/17/2014   HGB 15.3 12/17/2014   HCT 44.9 12/17/2014   PLT 292.0 12/17/2014   GLUCOSE 118* 01/31/2015   CHOL 124 11/29/2014   TRIG 193.0* 11/29/2014   HDL 34.30* 11/29/2014   LDLDIRECT 69.8 05/31/2014   LDLCALC 51 11/29/2014   ALT 27 11/29/2014   AST 18 11/29/2014   NA 139 01/31/2015   K 4.0 01/31/2015   CL 107 01/31/2015   CREATININE 0.86 01/31/2015   BUN 21 01/31/2015   CO2 25 01/31/2015   TSH 1.35 05/31/2014   HGBA1C 6.0 11/30/2014      Assessment / Plan:   Dilated aortic root without significant AI , s/p Ross Procedure.  Root measured 4.9 cm in 2003 by echo . I would not recommend repeat surgery at this time without AI if develops further dilation with increasing AI consider redo root replacement and poss pulmonic homograft as needed. Will see back in 6 months  discussed with him endocarditis precautions   and avoiding weight lifting [place] Shown     I  spent 40 minutes counseling the patient face to face and 50% or more the  time was spent in counseling and coordination of care. The total time spent in the appointment was 60 minutes.  Grace Isaac MD      [address] [place] [address] Office [phone]   Beeper [phone]  03/14/2015 1:47 PM

## 2015-04-29 ENCOUNTER — Other Ambulatory Visit: Payer: Self-pay

## 2015-04-29 MED ORDER — SIMVASTATIN 20 MG PO TABS: 20.0000 mg | ORAL_TABLET | Freq: Every day | ORAL | Status: DC

## 2015-04-29 NOTE — Telephone Encounter (Signed)
[name], MD at 01/31/2015 9:33 PM  simvastatin (ZOCOR) 20 MG tablet TAKE 1 TABLET BY MOUTH EVERY DAY Patient Instructions     Medication Instructions:  Increase atenolol to 50mg  daily

## 2015-06-03 ENCOUNTER — Ambulatory Visit (INDEPENDENT_AMBULATORY_CARE_PROVIDER_SITE_OTHER): Payer: Commercial Managed Care - HMO | Admitting: Family Medicine

## 2015-06-03 ENCOUNTER — Encounter: Payer: Self-pay | Admitting: Family Medicine

## 2015-06-03 VITALS — BP 110/78 | HR 76 | Temp 98.1°F | Resp 16 | Ht 74.75 in | Wt 236.0 lb

## 2015-06-03 DIAGNOSIS — I10 Essential (primary) hypertension: Secondary | ICD-10-CM | POA: Diagnosis not present

## 2015-06-03 DIAGNOSIS — N400 Enlarged prostate without lower urinary tract symptoms: Secondary | ICD-10-CM | POA: Diagnosis not present

## 2015-06-03 DIAGNOSIS — Z23 Encounter for immunization: Secondary | ICD-10-CM

## 2015-06-03 DIAGNOSIS — I712 Thoracic aortic aneurysm, without rupture, unspecified: Secondary | ICD-10-CM

## 2015-06-03 DIAGNOSIS — Z0000 Encounter for general adult medical examination without abnormal findings: Secondary | ICD-10-CM

## 2015-06-03 DIAGNOSIS — K219 Gastro-esophageal reflux disease without esophagitis: Secondary | ICD-10-CM | POA: Diagnosis not present

## 2015-06-03 DIAGNOSIS — E785 Hyperlipidemia, unspecified: Secondary | ICD-10-CM | POA: Diagnosis not present

## 2015-06-03 LAB — CBC WITH DIFFERENTIAL/PLATELET
Basophils Absolute: 0 10*3/uL (ref 0.0–0.1)
Basophils Relative: 0.5 % (ref 0.0–3.0)
EOS PCT: 1.7 % (ref 0.0–5.0)
Eosinophils Absolute: 0.2 10*3/uL (ref 0.0–0.7)
HCT: 44.7 % (ref 39.0–52.0)
Hemoglobin: 15.1 g/dL (ref 13.0–17.0)
LYMPHS ABS: 2.1 10*3/uL (ref 0.7–4.0)
Lymphocytes Relative: 24 % (ref 12.0–46.0)
MCHC: 33.7 g/dL (ref 30.0–36.0)
MCV: 89.6 fl (ref 78.0–100.0)
MONO ABS: 0.9 10*3/uL (ref 0.1–1.0)
Monocytes Relative: 10.1 % (ref 3.0–12.0)
NEUTROS PCT: 63.7 % (ref 43.0–77.0)
Neutro Abs: 5.7 10*3/uL (ref 1.4–7.7)
PLATELETS: 290 10*3/uL (ref 150.0–400.0)
RBC: 4.99 Mil/uL (ref 4.22–5.81)
RDW: 13.2 % (ref 11.5–15.5)
WBC: 8.9 10*3/uL (ref 4.0–10.5)

## 2015-06-03 LAB — HEPATIC FUNCTION PANEL
ALBUMIN: 4.1 g/dL (ref 3.5–5.2)
ALK PHOS: 64 U/L (ref 39–117)
ALT: 20 U/L (ref 0–53)
AST: 15 U/L (ref 0–37)
Bilirubin, Direct: 0.2 mg/dL (ref 0.0–0.3)
TOTAL PROTEIN: 6.7 g/dL (ref 6.0–8.3)
Total Bilirubin: 1 mg/dL (ref 0.2–1.2)

## 2015-06-03 LAB — TSH: TSH: 1.39 u[IU]/mL (ref 0.35–4.50)

## 2015-06-03 LAB — BASIC METABOLIC PANEL
BUN: 14 mg/dL (ref 6–23)
CHLORIDE: 104 meq/L (ref 96–112)
CO2: 27 mEq/L (ref 19–32)
Calcium: 9.5 mg/dL (ref 8.4–10.5)
Creatinine, Ser: 0.84 mg/dL (ref 0.40–1.50)
GFR: 98.13 mL/min (ref >60.00)
GLUCOSE: 97 mg/dL (ref 70–99)
POTASSIUM: 4.6 meq/L (ref 3.5–5.1)
Sodium: 140 mEq/L (ref 135–145)

## 2015-06-03 LAB — LIPID PANEL
CHOL/HDL RATIO: 4
CHOLESTEROL: 138 mg/dL (ref 0–200)
HDL: 36 mg/dL — AB (ref >39.00)
LDL CALC: 78 mg/dL (ref 0–99)
NonHDL: 101.54
TRIGLYCERIDES: 116 mg/dL (ref 0.0–149.0)
VLDL: 23.2 mg/dL (ref 0.0–40.0)

## 2015-06-03 LAB — PSA: PSA: 1.25 ng/mL (ref 0.10–4.00)

## 2015-06-03 MED ORDER — RANITIDINE HCL 300 MG PO TABS: 300.0000 mg | ORAL_TABLET | Freq: Every day | ORAL | Status: DC

## 2015-06-03 NOTE — Assessment & Plan Note (Signed)
Chronic problem.  On Proscar.  Check PSA.

## 2015-06-03 NOTE — Assessment & Plan Note (Signed)
Chronic problem.  Following w/ Dr Servando Snare.  Pt has MRI scheduled for Jan.  Will follow along

## 2015-06-03 NOTE — Assessment & Plan Note (Signed)
Chronic problem.  Tolerating statin w/o difficulty.  Check labs.  Adjust meds prn  

## 2015-06-03 NOTE — Patient Instructions (Signed)
Follow up in 6 months to recheck BP and cholesterol We'll notify you of your lab results and make any changes if needed Keep up the good work on healthy diet and regular exercise- you look great! You are up to date on colonoscopy until 2025- yay! Continue the Ranitidine (Zantac) nightly to prevent heart burn/indigestion Call with any questions or concerns If you want to join [place] at the new [place] office, any scheduled appointments will automatically transfer and we will see you at [address] [place] [address] [place], [place] [address]  Happy Halloween!!!

## 2015-06-03 NOTE — Assessment & Plan Note (Signed)
Deteriorated.  Pt now requiring Zantac frequently to control sxs.  Will start scheduled H2 blocker.  If no improvement, pt to notify me so we can start PPI.  Pt expressed understanding and is in agreement w/ plan.

## 2015-06-03 NOTE — Progress Notes (Signed)
Pre visit review using our clinic review tool, if applicable. No additional management support is needed unless otherwise documented below in the visit note. 

## 2015-06-03 NOTE — Assessment & Plan Note (Signed)
Pt's PE unchanged from previous.  UTD on colonoscopy- not due until 2025.  Written screening schedule updated and given to pt.  Flu shot given today.  Check labs.  Anticipatory guidance provided.

## 2015-06-03 NOTE — Assessment & Plan Note (Signed)
Chronic problem.  Adequate control.  Asymptomatic.  Check labs.  No anticipated med changes 

## 2015-06-03 NOTE — Progress Notes (Signed)
   Subjective:    Patient ID: [name] [name], male    DOB: 18-Sep-1950, 64 y.o.   MRN: [id number]  HPI Here today for CPE.  Risk Factors: HTN- chronic problem, on atenolol and micardis.  Denies CP, SOB, HAs, visual changes, edema. GERD- pt reports increased indigestion, 'almost any food will cause it'.  Improves w/ Zantac 150mg  daily.  Previously on Nexium.  No ETOH, tobacco.  + daily coffee. Hyperlipidemia- chronic problem, on simvastatin.  Pt has gained 7 lbs since last visit.  Denies abd pain, N/V, myalgias. Thoracic Aortic Aneurysm- chronic problem, following w/ Dr Aundra Dubin and Dr Servando Snare.  Asymptomatic.  Has repeat MRI in January.  Physical Activity: exercising regularly Fall Risk: low Depression: denies Hearing: normal to conversational tones, mildly decreased to whispered voice ADL's: independent Cognitive: normal linear thought process, memory and attention intact Home Safety: safe at home, lives w/ wife Height, Weight, BMI, Visual Acuity: see vitals, vision corrected to 20/20 w/ glasses Counseling: UTD on colonoscopy (Dr Henrene Pastor- due 2025) Care team reviewed and updated Labs Ordered: See A&P Care Plan: See A&P    Review of Systems Patient reports no vision/hearing changes, anorexia, fever ,adenopathy, persistant/recurrent hoarseness, swallowing issues, chest pain, palpitations, edema, persistant/recurrent cough, hemoptysis, dyspnea (rest,exertional, paroxysmal nocturnal), gastrointestinal  bleeding (melena, rectal bleeding), abdominal pain, GU symptoms (dysuria, hematuria, voiding/incontinence issues) syncope, focal weakness, memory loss, numbness & tingling, skin/hair/nail changes, depression, anxiety, abnormal bruising/bleeding, musculoskeletal symptoms/signs.     Objective:   Physical Exam General Appearance:    Alert, cooperative, no distress, appears stated age  Head:    Normocephalic, without obvious abnormality, atraumatic  Eyes:    PERRL, conjunctiva/corneas clear,  EOM's intact, fundi    benign, both eyes       Ears:    Normal TM's and external ear canals, both ears  Nose:   Nares normal, septum midline, mucosa normal, no drainage   or sinus tenderness  Throat:   Lips, mucosa, and tongue normal; teeth and gums normal  Neck:   Supple, symmetrical, trachea midline, no adenopathy;       thyroid:  No enlargement/tenderness/nodules  Back:     Symmetric, no curvature, ROM normal, no CVA tenderness  Lungs:     Clear to auscultation bilaterally, respirations unlabored  Chest wall:    No tenderness or deformity  Heart:    Regular rate and rhythm, S1 and S2 normal, no murmur, rub   or gallop  Abdomen:     Soft, non-tender, bowel sounds active all four quadrants,    no masses, no organomegaly  Genitalia:    Deferred to urology  Rectal:    Extremities:   Extremities normal, atraumatic, no cyanosis or edema  Pulses:   2+ and symmetric all extremities  Skin:   Skin color, texture, turgor normal, no rashes or lesions  Lymph nodes:   Cervical, supraclavicular, and axillary nodes normal  Neurologic:   CNII-XII intact.          Assessment & Plan:

## 2015-06-11 ENCOUNTER — Other Ambulatory Visit: Payer: Self-pay | Admitting: Cardiology

## 2015-06-11 MED ORDER — TELMISARTAN 80 MG PO TABS: 80.0000 mg | ORAL_TABLET | Freq: Every day | ORAL | Status: DC

## 2015-07-15 ENCOUNTER — Ambulatory Visit (INDEPENDENT_AMBULATORY_CARE_PROVIDER_SITE_OTHER): Payer: Commercial Managed Care - HMO | Admitting: Medical

## 2015-07-15 ENCOUNTER — Encounter: Payer: Self-pay | Admitting: Medical

## 2015-07-15 VITALS — BP 112/76 | HR 64 | Temp 98.1°F | Ht 74.75 in | Wt 240.6 lb

## 2015-07-15 DIAGNOSIS — T485X1A Poisoning by other anti-common-cold drugs, accidental (unintentional), initial encounter: Secondary | ICD-10-CM

## 2015-07-15 DIAGNOSIS — R0981 Nasal congestion: Secondary | ICD-10-CM

## 2015-07-15 DIAGNOSIS — J310 Chronic rhinitis: Secondary | ICD-10-CM | POA: Diagnosis not present

## 2015-07-15 DIAGNOSIS — T485X5A Adverse effect of other anti-common-cold drugs, initial encounter: Secondary | ICD-10-CM

## 2015-07-15 MED ORDER — DOXYCYCLINE HYCLATE 100 MG PO TABS: 100.0000 mg | ORAL_TABLET | Freq: Two times a day (BID) | ORAL | Status: DC

## 2015-07-15 MED ORDER — METHYLPREDNISOLONE ACETATE 40 MG/ML IJ SUSP
40.0000 mg | Freq: Once | INTRAMUSCULAR | Status: AC
  Administered 2015-07-15: 40 mg via INTRAMUSCULAR

## 2015-07-15 MED ORDER — FLUTICASONE PROPIONATE 50 MCG/ACT NA SUSP: 2.0000 | Freq: Every day | NASAL | Status: DC

## 2015-07-15 NOTE — Progress Notes (Signed)
Subjective:    Patient ID: [name] [name], male    DOB: Aug 23, 1950, 64 y.o.   MRN: [id number]  HPI   Pt in states he was exposed to some smoke he could smell smoke at [facility] cart 2 wks ago. Next day he felt congestion. Some occasional sneezing. Pt using otc nasal spray 4 way. Using for 2 wks or more. No sinus pain. No fever or chills. No cough. Last week faint transient nose bleed left nares. None since.   Review of Systems  Constitutional: Negative for fever, chills and fatigue.  HENT: Positive for congestion. Negative for postnasal drip, sinus pressure and sore throat.   Respiratory: Negative for chest tightness, shortness of breath and wheezing.   Cardiovascular: Negative for chest pain and palpitations.  Skin: Negative for pallor and rash.  Hematological: Negative for adenopathy. Does not bruise/bleed easily.  Psychiatric/Behavioral: Negative for behavioral problems and confusion. The patient is not nervous/anxious.     Past Medical History  Diagnosis Date  . Acute gastritis   . Hiatal hernia   . GERD (gastroesophageal reflux disease)   . Esophageal stricture   . Early satiety   . HTN (hypertension)   . Hyperlipidemia   . Pain, dental   . Aortic insufficiency     s/p aortic valve replacement in 1994  . Neuropathy ([place])     left brachial plexus    Social History   Social History  . Marital Status: Married    Spouse Name: N/A  . Number of Children: 3  . Years of Education: N/A   Occupational History  . Not on file.   Social History Main Topics  . Smoking status: Never Smoker   . Smokeless tobacco: Never Used  . Alcohol Use: No  . Drug Use: No  . Sexual Activity: Not on file   Other Topics Concern  . Not on file   Social History Narrative    Past Surgical History  Procedure Laterality Date  . Hiatal hernia repair  DEC 2002  . Tonsillectomy    . Aortic valve replacement  1994  . Vasectomy  DEC 2002  . Ganglion cyst excision  JAN 2002    Family  History  Problem Relation Age of Onset  . Colon cancer Neg Hx   . Pancreatic cancer Neg Hx   . Rectal cancer Neg Hx   . Stomach cancer Neg Hx     Allergies  Allergen Reactions  . Penicillins Hives and Swelling  . Quinidine Other (See Comments)    Increase heart beat per patient    Current Outpatient Prescriptions on File Prior to Visit  Medication Sig Dispense Refill  . aspirin 81 MG chewable tablet Chew 81 mg by mouth daily.      Marland Kitchen atenolol (TENORMIN) 50 MG tablet Take 1 tablet (50 mg total) by mouth daily. 90 tablet 3  . finasteride (PROSCAR) 5 MG tablet Take 5 mg by mouth daily.      . simvastatin (ZOCOR) 20 MG tablet Take 1 tablet (20 mg total) by mouth daily. 90 tablet 0  . telmisartan (MICARDIS) 80 MG tablet Take 1 tablet (80 mg total) by mouth daily. 90 tablet 2  . VIAGRA 100 MG tablet      No current facility-administered medications on file prior to visit.    BP 112/76 mmHg  Pulse 64  Temp(Src) 98.1 F (36.7 C) (Oral)  Ht 6' 2.75" (1.899 m)  Wt 240 lb 9.6 oz (109.135 kg)  BMI  30.26 kg/m2  SpO2 98%       Objective:   Physical Exam  General  Mental Status - Alert. General Appearance - Well groomed. Not in acute distress.  Skin Rashes- No Rashes.  HEENT Head- Normal. Ear Auditory Canal - Left- Normal. Right - Normal.Tympanic Membrane- Left- Normal. Right- Normal. Eye Sclera/Conjunctiva- Left- Normal. Right- Normal. Nose & Sinuses Nasal Mucosa- Left-  Severe  Boggy and Congested. Right-  Severe  Boggy and  Congested.Bilateral no maxillary and no frontal sinus pressure. Mouth & Throat Lips: Upper Lip- Normal: no dryness, cracking, pallor, cyanosis, or vesicular eruption. Lower Lip-Normal: no dryness, cracking, pallor, cyanosis or vesicular eruption. Buccal Mucosa- Bilateral- No Aphthous ulcers. Oropharynx- No Discharge or Erythema. Tonsils: Characteristics- Bilateral- No Erythema or Congestion. Size/Enlargement- Bilateral- No enlargement. Discharge-  bilateral-None.  Neck Neck- Supple. No Masses.   Chest and Lung Exam Auscultation: Breath Sounds:-Clear even and unlabored.  Cardiovascular Auscultation:Rythm- Regular, rate and rhythm. Murmurs & Other Heart Sounds:Ausculatation of the heart reveal- No Murmurs.  Lymphatic Head & Neck General Head & Neck Lymphatics: Bilateral: Description- No Localized lymphadenopathy.       Assessment & Plan:  For your severe nasal congestion I want you to stop 4way nasal spray. I talked with our pharmacy and you can get rebound congestion from this.  I will give you depomedrol 40 mg Im injection to help expedite decogestant effect. Stop 4way or other similar. I will rx flonase nasal spray.  If you develop sinus infection type symptoms or bronchitis type symptoms then start doxycycline antibiotic.  Follow up in 10-14 days or as needed

## 2015-07-15 NOTE — Patient Instructions (Addendum)
For your severe nasal congestion I want you to stop 4way nasal spray. I talked with our pharmacy and you can get rebound congestion from this.  I will give you depomedrol 40 mg Im injection to help expedite decogestant effect. Stop 4way or other similar. I will rx flonase nasal spray.  If you develop sinus infection type symptoms or bronchitis type symptoms then start doxycycline antibiotic.  Follow up in 10-14 days or as needed

## 2015-07-15 NOTE — Progress Notes (Signed)
Pre visit review using our clinic review tool, if applicable. No additional management support is needed unless otherwise documented below in the visit note. 

## 2015-07-22 ENCOUNTER — Other Ambulatory Visit: Payer: Self-pay | Admitting: Cardiology

## 2015-08-19 ENCOUNTER — Ambulatory Visit (HOSPITAL_COMMUNITY)
Admission: RE | Admit: 2015-08-19 | Discharge: 2015-08-19 | Disposition: A | Discharge status: Home / Self Care | Payer: Commercial Managed Care - HMO | Source: Ambulatory Visit | Attending: Cardiology | Admitting: Cardiology

## 2015-08-19 DIAGNOSIS — I712 Thoracic aortic aneurysm, without rupture, unspecified: Secondary | ICD-10-CM

## 2015-08-19 DIAGNOSIS — Z95828 Presence of other vascular implants and grafts: Secondary | ICD-10-CM | POA: Diagnosis not present

## 2015-08-19 DIAGNOSIS — Z954 Presence of other heart-valve replacement: Secondary | ICD-10-CM | POA: Insufficient documentation

## 2015-08-19 DIAGNOSIS — I719 Aortic aneurysm of unspecified site, without rupture: Secondary | ICD-10-CM

## 2015-08-19 DIAGNOSIS — I77810 Thoracic aortic ectasia: Secondary | ICD-10-CM | POA: Insufficient documentation

## 2015-08-19 DIAGNOSIS — I7121 Aneurysm of the ascending aorta, without rupture: Secondary | ICD-10-CM

## 2015-08-19 LAB — CREATININE, SERUM
CREATININE: 0.88 mg/dL (ref 0.61–1.24)
GFR calc Af Amer: >60 mL/min (ref >60)
GFR calc non Af Amer: >60 mL/min (ref >60)

## 2015-08-19 MED ORDER — GADOBENATE DIMEGLUMINE 529 MG/ML IV SOLN
25.0000 mL | Freq: Once | INTRAVENOUS | Status: AC | PRN
Start: 2015-08-19 — End: 2015-08-19
  Administered 2015-08-19: 25 mL via INTRAVENOUS

## 2015-08-21 ENCOUNTER — Encounter: Payer: Self-pay | Admitting: Cardiology

## 2015-08-21 MED ORDER — GADOBENATE DIMEGLUMINE 529 MG/ML IV SOLN
25.0000 mL | Freq: Once | INTRAVENOUS | Status: AC | PRN
  Administered 2015-08-19: 25 mL via INTRAVENOUS

## 2015-08-22 ENCOUNTER — Other Ambulatory Visit: Payer: Self-pay | Admitting: *Deleted

## 2015-08-22 DIAGNOSIS — I77810 Thoracic aortic ectasia: Secondary | ICD-10-CM

## 2015-08-22 DIAGNOSIS — Z954 Presence of other heart-valve replacement: Secondary | ICD-10-CM

## 2015-09-12 ENCOUNTER — Encounter: Payer: Self-pay | Admitting: Cardiothoracic Surgery

## 2015-09-12 ENCOUNTER — Ambulatory Visit (INDEPENDENT_AMBULATORY_CARE_PROVIDER_SITE_OTHER): Payer: Commercial Managed Care - HMO | Admitting: Cardiothoracic Surgery

## 2015-09-12 VITALS — BP 120/80 | HR 60 | Resp 20 | Ht 74.75 in | Wt 240.0 lb

## 2015-09-12 DIAGNOSIS — I7121 Aneurysm of the ascending aorta, without rupture: Secondary | ICD-10-CM

## 2015-09-12 DIAGNOSIS — I712 Thoracic aortic aneurysm, without rupture: Secondary | ICD-10-CM

## 2015-09-12 DIAGNOSIS — I351 Nonrheumatic aortic (valve) insufficiency: Secondary | ICD-10-CM

## 2015-09-12 NOTE — Progress Notes (Signed)
[address]       [place] [address]             [phone]                    Levester F Akel Benedict Medical Record [id number] Date of Birth: Dec 16, 1950  Referring: [name], MD Primary Care: Annye Asa, MD  Chief Complaint:    Chief Complaint  Patient presents with  . Follow-up    6 month f/u, discuss MRA Chest, HX of Ross procedure  . Thoracic Aortic Aneurysm    History of Present Illness:    [name] [name] 65 y.o. male is seen in the office  today for  Follow up after Ross procedure done in [facility]   for congenital aortic insufficiency. MRA of chest was done recently and patient here to review results . Currently the patient is asymptomatic from the standpoint of heart failure. He denies angina, PND, pedal edema or dyspnea on exertion. He has been noted to have dilatation of his aortic root at the sinuses of Valsalva following his Ross procedure. Reports from echocardiogram in 2003 measured aortic root at 4.9 cm, 2006 4.7 cm, recent MRI 5.1. The patient's mid ascending aorta which is partially native aorta and partial prosthetic graft is not significantly dilated.         1. Aortic insufficiency: s/p Ross procedure in 1994 in [place]. Aortic insufficiency: Echo (4/11) with hypokinetic basal septum (likely post-surgical), EF 50%, mild LVH, s/p Ross procedure with native pulmonic valve in aortic position with mild aortic insufficiency and bioprosthetic pulmonic valve with no pulmonic insufficiency, mild MR, aortic upper normal in size. Echo (4/13) with EF 55%, mild LVH, mild AI, mild MR, mild RV dilation, bioprosthetic pulmonic valve with peak pressure 25 mmHg. Echo (3/14): Mild LV dilation, mild LVH, EF 60%, pulmonary valve in aortic position (s/p Ross) with mild AI and mildly dilated ascending aorta to 4.3 cm, mildly dilated RV with normal systolic function, bioprosthetic PVR with mean gradient 23 mmHg. Echo (3/15) with EF 55-60%,  mild LVH, mild AI with no AS, bioprosthetic pulmonic valve with peak gradient 31 mmHg, ascending aorta 4.4 cm. Cardiac MRI/MRA chest (5/15) with mild regurgitation of autograft aortic valve and mild regurgitation of homograft pulmonic valve, dilation of aortic root at sinuses of valsalva (4.8 cm), EF 6%, no LGE, mild RV dilation with normal RV systolic function. Echo (5/16) with EF 55-60%, mild autograft AI, mild MR, mildly decreased RV systolic function, biatrial enlargement, RVSP 35 mmHg, trivially increased gradient across bioprosthetic pulmonic valve (no progression).  2. Post-operative atrial fibrillation after heart surgery.  3. Normal left heart cath prior to 1994 heart surgery.  4. Left brachial plexopathy with left shoulder muscle atrophy. Uncertain etiology.  5. BPH 6. Hyperlipidemia.  7. Cervical spinal stenosis with radiculopathy.  8. 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  9. ETT-Sestamibi (3/14): No ischemia or infarction.  10. Carotid dopplers (6/14): mild disease only.      Current Activity/ Functional Status:  Patient is independent with mobility/ambulation, transfers, ADL's, IADL's.   Zubrod Score: At the time of surgery this patient's most appropriate activity status/level should be described as: [x]     0    Normal activity, no symptoms []     1    Restricted in physical strenuous activity but ambulatory, able to do out  light work []     2    Ambulatory and capable of self care, unable to do work activities, up and about               >50 % of waking hours                              []     3    Only limited self care, in bed greater than 50% of waking hours []     4    Completely disabled, no self care, confined to bed or chair []     5    Moribund   Past Medical History  Diagnosis Date  . Acute gastritis   . Hiatal hernia   . GERD (gastroesophageal reflux disease)   . Esophageal  stricture   . Early satiety   . HTN (hypertension)   . Hyperlipidemia   . Pain, dental   . Aortic insufficiency     s/p aortic valve replacement in 1994  . Neuropathy ([place])     left brachial plexus    Past Surgical History  Procedure Laterality Date  . Hiatal hernia repair  DEC 2002  . Tonsillectomy    . Aortic valve replacement  1994  . Vasectomy  DEC 2002  . Ganglion cyst excision  JAN 2002    Family History  Problem Relation Age of Onset  . Colon cancer Neg Hx   . Pancreatic cancer Neg Hx   . Rectal cancer Neg Hx   . Stomach cancer Neg Hx     Social History   Social History  . Marital Status: Married    Spouse Name: N/A  . Number of Children: 3  . Years of Education: N/A   Occupational History  . Not on file.   Social History Main Topics  . Smoking status: Never Smoker   . Smokeless tobacco: Never Used  . Alcohol Use: No  . Drug Use: No  . Sexual Activity: Not on file   Other Topics Concern  . Not on file   Social History Narrative    History  Smoking status  . Never Smoker   Smokeless tobacco  . Never Used    History  Alcohol Use No     Allergies  Allergen Reactions  . Penicillins Hives and Swelling  . Quinidine Other (See Comments)    Increase heart beat per patient    Current Outpatient Prescriptions  Medication Sig Dispense Refill  . aspirin 81 MG chewable tablet Chew 81 mg by mouth daily.      Marland Kitchen atenolol (TENORMIN) 50 MG tablet Take 1 tablet (50 mg total) by mouth daily. 90 tablet 3  . finasteride (PROSCAR) 5 MG tablet Take 5 mg by mouth daily.      . simvastatin (ZOCOR) 20 MG tablet TAKE 1 TABLET(20 MG) BY MOUTH DAILY 90 tablet 1  . telmisartan (MICARDIS) 80 MG tablet Take 1 tablet (80 mg total) by mouth daily. 90 tablet 2  . VIAGRA 100 MG tablet      No current facility-administered medications for this visit.      Review of Systems:     Cardiac Review of Systems: Y or N  Chest Pain [n    ]  Resting SOB [n   ] Exertional  SOB  [n  ]  Orthopnea [ n ]   Pedal Edema [ n  ]    Palpitations [  n ] Syncope  [ n ]   Presyncope [ n  ]  General Review of Systems: [Y] = yes [  ]=no Constitional: recent weight change [n  ];  Wt loss over the last 3 months [   ] anorexia [  ]; fatigue [n  ]; nausea [  ]; night sweats [ n ]; fever [n  ]; or chills [  ];          Dental: poor dentition[n  ]; Last Dentist visit:   Eye : blurred vision [  ]; diplopia [   ]; vision changes [  ];  Amaurosis fugax[  ]; Resp: cough [  ];  wheezing[  ];  hemoptysis[  ]; shortness of breath[  ]; paroxysmal nocturnal dyspnea[  ]; dyspnea on exertion[  ]; or orthopnea[  ];  GI:  gallstones[  ], vomiting[  ];  dysphagia[  ]; melena[  ];  hematochezia [  ]; heartburn[  ];   Hx of  Colonoscopy[  ]; GU: kidney stones [  ]; hematuria[  ];   dysuria [  ];  nocturia[  ];  history of     obstruction [  ]; urinary frequency [  ]             Skin: rash, swelling[  ];, hair loss[  ];  peripheral edema[  ];  or itching[  ]; Musculosketetal: myalgias[  ];  joint swelling[  ];  joint erythema[  ];  joint pain[  ];  back pain[  ];  Heme/Lymph: bruising[  ];  bleeding[  ];  anemia[  ];  Neuro: TIA[transient rt eye blindness evaluated by neurology  ];  headaches[  ];  stroke[  ];  vertigo[  ];  seizures[  ];   paresthesias[  ];  difficulty walking[  ];  Psych:depression[ n ]; anxiety[  ];  Endocrine: diabetes[  ];  thyroid dysfunction[  ];  Immunizations: Flu up to date [ y ]; Pneumococcal up to date [ y ];  Other:  Physical Exam: BP 120/80 mmHg  Pulse 60  Resp 20  Ht 6' 2.75" (1.899 m)  Wt 240 lb (108.863 kg)  BMI 30.19 kg/m2  SpO2 97%  PHYSICAL EXAMINATION: General appearance: alert, cooperative, appears stated age and no distress Head: Normocephalic, without obvious abnormality, atraumatic Neck: no adenopathy, no carotid bruit, no JVD, supple, symmetrical, trachea midline and thyroid not enlarged, symmetric, no tenderness/mass/nodules Lymph nodes: Cervical,  supraclavicular, and axillary nodes normal. Resp: clear to auscultation bilaterally Back: symmetric, no curvature. ROM normal. No CVA tenderness. Cardio: regular rate and rhythm, S1,  widely split S2, no S3, 2/6 early SEM radiating to the neck.  GI: soft, non-tender; bowel sounds normal; no masses,  no organomegaly Extremities: extremities normal, atraumatic, no cyanosis or edema and Homans sign is negative, no sign of DVT, left should atrophy, plexus inj Neurologic: Alert and oriented X 3, normal strength and tone. Normal symmetric reflexes. Normal coordination and gait  Diagnostic Studies & Laboratory data:     Recent Radiology Findings:   Mr Angiogram Chest W Wo Contrast  08/19/2015  CLINICAL DATA:  S/p Ross procedure with dilated aortic root. EXAM: CARDIAC MRI TECHNIQUE: The patient was scanned on a 1.5 Tesla GE magnet. A dedicated cardiac coil was used. FIESTA sequences were done for functional assessment. The patient received 25 cc of Multihance contrast for MR angiography. CONTRAST:  25 cc Multihance FINDINGS: The patient was s/p sternotomy. Limited images of lung fields  showed no gross abnormalities. There was a pulmonary valve and artery autograft in the aortic position. The autograft valve was trileaflet with mild autograft valve insufficiency. There did not appear to be autograft valve stenosis. There was a homograft in the pulmonary position. The homograft valve was thickened with probably mild regurgitation. There was turbulent flow across the homograft valve suggestive of some degree of stenosis (this is better assessed by echo). The autograft in the aortic position had dilated sinuses of valsalva, especially the left coronary sinus (SOV measured 5.2 cm when measured into the left coronary sinus). The coronary arteries were visualized and appeared patent proximally (coronary buttons re-attached to pulmonary autograft). There was narrowing at the suture line where the autograft was sewed  into the native ascending aorta. At the narrowing, the ascending aorta was 2.4 cm in diameter. There did appear to be some turbulent flow at this narrowing. The aortic arch was not dilated. The great vessels had normal origins. The pulmonary veins appeared to drain normally to the left atrium (though the right-sided veins were not well-visualized). MEASUREMENTS: MEASUREMENTS Sinus of valsalva measurement into left cusp 5.2 cm, into right cusp 4.7 cm, into noncoronary cusp 4.2 cm Sinotubular junction 4.5 cm Ascending aorta at narrowing where autograft joined to native aorta 2.4 cm Aortic arch 3.2 cm Descending thoracic aorta 2.6 cm IMPRESSION: 1. Status post Ross procedure with pulmonary valve and artery autograft in aortic position with re-implanted coronary buttons and homograft in the pulmonary position. There was mild autograft valve regurgitation and no significant stenosis. The homograft pulmonary valve was thickened with at least mild regurgitation and suspect a degree of stenosis. 2. There was dilation of the pulmonary autograft in the aortic position at the sinuses of valsalva, reaching 5.2 cm as measured into the left coronary cusp. There was also narrowing with some turbulent flow at the suture line where autograft was sewed into the native ascending aorta. Sinus of valsalva dilation does not appear to have significant progressed compared to prior MRA. [name] [name] Electronically Signed   By: Loralie Champagne M.D.   On: 08/19/2015 23:08     I have independently reviewed the above radiologic studies.  ECHO 12/10/2014: Study Conclusions  - Left ventricle: The cavity size was normal. Wall thickness was normal. Systolic function was normal. The estimated ejection fraction was in the range of 55% to 60%. Wall motion was normal; there were no regional wall motion abnormalities. Left ventricular diastolic function parameters were normal. - Aortic valve: There was mild regurgitation directed  eccentrically in the LVOT. - Mitral valve: There was mild regurgitation. - Left atrium: The atrium was mildly dilated. - Right ventricle: Systolic function was mildly reduced. - Right atrium: The atrium was moderately dilated. - Atrial septum: No defect or patent foramen ovale was identified. - Pulmonic valve: The findings are consistent with trivial stenosis. - Pulmonary arteries: RV systolic pressure: 35 mm Hg (S).  Impressions:  - Compared with the previous report, ther has been no progression of the neo-aortic valve insufficiency or the pulmonic homograft stenosis.    Recent Lab Findings: Lab Results  Component Value Date   WBC 8.9 06/03/2015   HGB 15.1 06/03/2015   HCT 44.7 06/03/2015   PLT 290.0 06/03/2015   GLUCOSE 97 06/03/2015   CHOL 138 06/03/2015   TRIG 116.0 06/03/2015   HDL 36.00* 06/03/2015   LDLDIRECT 69.8 05/31/2014   LDLCALC 78 06/03/2015   ALT 20 06/03/2015   AST 15 06/03/2015   NA 140  06/03/2015   K 4.6 06/03/2015   CL 104 06/03/2015   CREATININE 0.88 08/19/2015   BUN 14 06/03/2015   CO2 27 06/03/2015   TSH 1.39 06/03/2015   HGBA1C 6.0 11/30/2014      Assessment / Plan:   Dilated aortic root without significant AI , s/p Ross Procedure.  Root measured 4.9 cm in 2003 by echo . I would not recommend repeat surgery at this time without AI if develops further dilation with increasing AI consider redo root replacement and poss pulmonic homograft as needed. MRA appears stable Last echo 12/2014- follow up echo this year per cardiology Will see back in 12  Months, repeat mra , echo to be arranged by cardiology  discussed with him endocarditis precautions   and avoiding weight lifting Pola Corn MD      [address] [place] [address] Office [phone]   Beeper [phone]  09/12/2015 9:49 AM

## 2015-09-19 DIAGNOSIS — Z86018 Personal history of other benign neoplasm: Secondary | ICD-10-CM | POA: Diagnosis not present

## 2015-09-19 DIAGNOSIS — L821 Other seborrheic keratosis: Secondary | ICD-10-CM | POA: Diagnosis not present

## 2015-09-19 DIAGNOSIS — D485 Neoplasm of uncertain behavior of skin: Secondary | ICD-10-CM | POA: Diagnosis not present

## 2015-09-19 DIAGNOSIS — D1800 Hemangioma unspecified site: Secondary | ICD-10-CM | POA: Diagnosis not present

## 2015-09-19 DIAGNOSIS — D225 Melanocytic nevi of trunk: Secondary | ICD-10-CM | POA: Diagnosis not present

## 2015-09-19 DIAGNOSIS — L814 Other melanin hyperpigmentation: Secondary | ICD-10-CM | POA: Diagnosis not present

## 2015-09-19 DIAGNOSIS — D2271 Melanocytic nevi of right lower limb, including hip: Secondary | ICD-10-CM | POA: Diagnosis not present

## 2015-11-04 DIAGNOSIS — N401 Enlarged prostate with lower urinary tract symptoms: Secondary | ICD-10-CM | POA: Diagnosis not present

## 2015-11-04 DIAGNOSIS — N138 Other obstructive and reflux uropathy: Secondary | ICD-10-CM | POA: Diagnosis not present

## 2015-12-02 ENCOUNTER — Encounter: Payer: Self-pay | Admitting: Family Medicine

## 2015-12-02 ENCOUNTER — Ambulatory Visit (INDEPENDENT_AMBULATORY_CARE_PROVIDER_SITE_OTHER): Payer: Commercial Managed Care - HMO | Admitting: Family Medicine

## 2015-12-02 VITALS — BP 112/72 | HR 52 | Temp 98.0°F | Resp 16 | Ht 75.0 in | Wt 228.4 lb

## 2015-12-02 DIAGNOSIS — I10 Essential (primary) hypertension: Secondary | ICD-10-CM

## 2015-12-02 DIAGNOSIS — E785 Hyperlipidemia, unspecified: Secondary | ICD-10-CM

## 2015-12-02 DIAGNOSIS — N400 Enlarged prostate without lower urinary tract symptoms: Secondary | ICD-10-CM | POA: Diagnosis not present

## 2015-12-02 LAB — LIPID PANEL
CHOL/HDL RATIO: 4
Cholesterol: 117 mg/dL (ref 0–200)
HDL: 33.3 mg/dL — ABNORMAL LOW (ref >39.00)
LDL Cholesterol: 67 mg/dL (ref 0–99)
NonHDL: 84.16
TRIGLYCERIDES: 85 mg/dL (ref 0.0–149.0)
VLDL: 17 mg/dL (ref 0.0–40.0)

## 2015-12-02 LAB — CBC WITH DIFFERENTIAL/PLATELET
BASOS ABS: 0 10*3/uL (ref 0.0–0.1)
BASOS PCT: 0.4 % (ref 0.0–3.0)
EOS ABS: 0.1 10*3/uL (ref 0.0–0.7)
Eosinophils Relative: 1.6 % (ref 0.0–5.0)
HEMATOCRIT: 43.4 % (ref 39.0–52.0)
HEMOGLOBIN: 14.5 g/dL (ref 13.0–17.0)
LYMPHS PCT: 22.9 % (ref 12.0–46.0)
Lymphs Abs: 1.9 10*3/uL (ref 0.7–4.0)
MCHC: 33.4 g/dL (ref 30.0–36.0)
MCV: 89.8 fl (ref 78.0–100.0)
MONO ABS: 0.8 10*3/uL (ref 0.1–1.0)
Monocytes Relative: 10 % (ref 3.0–12.0)
Neutro Abs: 5.3 10*3/uL (ref 1.4–7.7)
Neutrophils Relative %: 65.1 % (ref 43.0–77.0)
Platelets: 259 10*3/uL (ref 150.0–400.0)
RBC: 4.84 Mil/uL (ref 4.22–5.81)
RDW: 13.6 % (ref 11.5–15.5)
WBC: 8.1 10*3/uL (ref 4.0–10.5)

## 2015-12-02 LAB — HEPATIC FUNCTION PANEL
ALBUMIN: 4.2 g/dL (ref 3.5–5.2)
ALT: 18 U/L (ref 0–53)
AST: 13 U/L (ref 0–37)
Alkaline Phosphatase: 46 U/L (ref 39–117)
BILIRUBIN TOTAL: 1.1 mg/dL (ref 0.2–1.2)
Bilirubin, Direct: 0.2 mg/dL (ref 0.0–0.3)
Total Protein: 6.6 g/dL (ref 6.0–8.3)

## 2015-12-02 LAB — BASIC METABOLIC PANEL
BUN: 16 mg/dL (ref 6–23)
CHLORIDE: 106 meq/L (ref 96–112)
CO2: 24 mEq/L (ref 19–32)
CREATININE: 0.82 mg/dL (ref 0.40–1.50)
Calcium: 9.3 mg/dL (ref 8.4–10.5)
GFR: 100.73 mL/min (ref >60.00)
GLUCOSE: 111 mg/dL — AB (ref 70–99)
Potassium: 4.4 mEq/L (ref 3.5–5.1)
Sodium: 137 mEq/L (ref 135–145)

## 2015-12-02 MED ORDER — FINASTERIDE 5 MG PO TABS: 5.0000 mg | ORAL_TABLET | Freq: Every day | ORAL | Status: DC

## 2015-12-02 NOTE — Assessment & Plan Note (Signed)
Ongoing issue for pt.  Dr Risa Grill has released him and asked that I prescribe the Finasteride.  Refills sent to pharmacy.  Will get PSA at CPE in 6 months.

## 2015-12-02 NOTE — Patient Instructions (Signed)
Schedule your complete physical in 6 months We'll notify you of your lab results and make any changes if needed Keep up the good work on healthy diet and regular exercise- you look great! Call with any questions or concerns Thanks for sticking with [place]! Happy Spring!!!

## 2015-12-02 NOTE — Assessment & Plan Note (Signed)
Chronic problem, on Simvastatin w/o difficulty.  Applauded his efforts at healthy diet and regular exercise.  Check labs.  Adjust meds prn

## 2015-12-02 NOTE — Progress Notes (Signed)
   Subjective:    Patient ID: [name] [name], male    DOB: 15-Apr-1951, 65 y.o.   MRN: [id number]  HPI HTN- chronic problem, on Telmisartan and Atenolol daily w/ excellent BP control.  Denies CP, SOB, HAs, visual changes, edema.  Hyperlipidemia- chronic problem, on Simvastatin daily.  Has lost 12 lbs!  Walking regularly.  Has changed diet.  Denies abd pain, N/V, myalgias.  BPH- chronic problem.  Has been following w/ Dr Risa Grill who indicated that pt doesn't need to come back unless something changes.  Asking if I will prescribe the Finasteride.   Review of Systems For ROS see HPI     Objective:   Physical Exam  Constitutional: He is oriented to person, place, and time. He appears well-developed and well-nourished. No distress.  HENT:  Head: Normocephalic and atraumatic.  Eyes: Conjunctivae and EOM are normal. Pupils are equal, round, and reactive to light.  Neck: Normal range of motion. Neck supple. No thyromegaly present.  Cardiovascular: Normal rate, regular rhythm and intact distal pulses.   Murmur (II/VI SEM w/ click) heard. Pulmonary/Chest: Effort normal and breath sounds normal. No respiratory distress.  Abdominal: Soft. Bowel sounds are normal. He exhibits no distension.  Musculoskeletal: He exhibits no edema.  Lymphadenopathy:    He has no cervical adenopathy.  Neurological: He is alert and oriented to person, place, and time. No cranial nerve deficit.  Skin: Skin is warm and dry.  Psychiatric: He has a normal mood and affect. His behavior is normal.  Vitals reviewed.         Assessment & Plan:

## 2015-12-02 NOTE — Assessment & Plan Note (Signed)
Chronic problem.  Excellent control.  Asymptomatic.  Check labs.  No anticipated med changes. 

## 2015-12-02 NOTE — Progress Notes (Signed)
Pre visit review using our clinic review tool, if applicable. No additional management support is needed unless otherwise documented below in the visit note. 

## 2015-12-03 ENCOUNTER — Other Ambulatory Visit (INDEPENDENT_AMBULATORY_CARE_PROVIDER_SITE_OTHER): Payer: Commercial Managed Care - HMO

## 2015-12-03 DIAGNOSIS — R7309 Other abnormal glucose: Secondary | ICD-10-CM

## 2015-12-03 LAB — HEMOGLOBIN A1C: Hgb A1c MFr Bld: 6.1 % (ref 4.6–6.5)

## 2016-01-24 ENCOUNTER — Other Ambulatory Visit: Payer: Self-pay | Admitting: Cardiology

## 2016-01-27 ENCOUNTER — Other Ambulatory Visit: Payer: Self-pay | Admitting: *Deleted

## 2016-01-27 NOTE — Telephone Encounter (Signed)
Patients lipids are checked by pcp. Ok to continue to refill or should this be deferred to pcp? Please advise. Thanks, MI

## 2016-01-28 ENCOUNTER — Other Ambulatory Visit: Payer: Self-pay

## 2016-01-30 ENCOUNTER — Encounter: Payer: Self-pay | Admitting: *Deleted

## 2016-01-30 MED ORDER — SIMVASTATIN 20 MG PO TABS: ORAL_TABLET | ORAL | Status: DC

## 2016-01-30 NOTE — Addendum Note (Signed)
Addended by: Roberts Gaudy on: 01/30/2016 07:52 AM   Modules accepted: Orders

## 2016-01-30 NOTE — Telephone Encounter (Signed)
What medication is he requesting?  If it is a cardiac medication you can refill, he has an appt with Dr Aundra Dubin in July 2017.

## 2016-02-19 ENCOUNTER — Ambulatory Visit: Payer: Commercial Managed Care - HMO | Admitting: Cardiology

## 2016-02-22 ENCOUNTER — Other Ambulatory Visit: Payer: Self-pay | Admitting: Cardiology

## 2016-02-23 ENCOUNTER — Other Ambulatory Visit: Payer: Self-pay | Admitting: Cardiology

## 2016-02-24 MED ORDER — TELMISARTAN 80 MG PO TABS: 80.0000 mg | ORAL_TABLET | Freq: Every day | ORAL | 0 refills | Status: DC

## 2016-02-26 ENCOUNTER — Other Ambulatory Visit: Payer: Self-pay

## 2016-02-26 DIAGNOSIS — I359 Nonrheumatic aortic valve disorder, unspecified: Secondary | ICD-10-CM

## 2016-02-26 MED ORDER — ATENOLOL 50 MG PO TABS: 50.0000 mg | ORAL_TABLET | Freq: Every day | ORAL | 0 refills | Status: DC

## 2016-02-26 NOTE — Telephone Encounter (Signed)
Gaston Islam [name], MD at 01/31/2015 9:33 PM  atenolol (TENORMIN) 50 MG tablet Take 1 tablet (50 mg total) by mouth daily   - Increase atenolol to 50 mg daily and continue ARB.   Patient Instructions   Medication Instructions:  Increase atenolol to 50mg  daily

## 2016-03-01 NOTE — Progress Notes (Signed)
Cardiology Office Note    Date:  03/04/2016   ID:  [name] [name], DOB 25-Aug-1950, MRN [id number]  PCP:  [name] Asa, MD  Cardiologist:  Dr. Aundra [name]  CC: cardiology follow up  History of Present Illness:  [name] [name] is a 65 y.o. male with a history of congenital aortic insufficiency s/p Ross procedure (1994 in [place]), aortic root aneurysm, HLD, HTN, RBBB, left brachial plexopathy with left shoulder muscle atrophy, HLD, and mild carotid artery disease who presents to clinic for cardiology follow up.   2D ECHO 12/10/14: EF 55-60%, no RWMAs, mild AR (no AS of autograft in aortic position), mild MR, mild LAE, mild RV dysfunction, mod RAE, trivial pulmonic stenosis (bioprosthetic pulmonic valve), RV systolic pressure [id number] Hg. Compared with the previous report, there has been no progression of the neo-aortic valve insufficiency or the pulmonic homograftstenosis.  MRA 08/19/15: mild autograft valve regurgitation and no significant stenosis, mild regurgitation and suspect a degree of stenosis in homograft pulmonary valve, dilation of the pulmonary autograft in the aortic position at the sinuses of valsalva, reaching 5.2 cm as measured into the left coronary cusp. Sinus of valsalva dilation does not appear to have significantly progressed compared to prior MRA  He is followed by Dr. Aundra [name] and Dr. Servando [name].   He saw Dr. Aundra [name] 01/2015. He was doing well with no exertional chest pain or dyspnea. Given aneurysm, atenolol was increased to 50mg  daily for more aggressive BP control. Plan was for repeat 2D ECHO in 01/2016, which has not been completed yet.   He saw Dr. Servando [name] in 09/2015 for continued monitoring of his dilated aortic root. Dr. Servando [name] does not recommend repeat surgery at this time unless he develops further dilation with increasing AI, then they could could consider redo root replacement +/- pulmonic homograft. Plan is for repeat MRA in 09/2016 and repeat 2D ECHO 01/2015.    Today he presents to clinic for follow up. No chest pain or SOB. No LE edema, orthopnea or PND. No dizziness or syncope. No blood in stool or urine. No formal exercise but very active in daily life. 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 Does not lift anything over 30 lbs. He is doing quite well.    Past Medical History:  Diagnosis Date  . Acute gastritis   . Aortic insufficiency    s/p aortic valve replacement in 1994  . Early satiety   . Esophageal stricture   . GERD (gastroesophageal reflux disease)   . Hiatal hernia   . HTN (hypertension)   . Hyperlipidemia   . Neuropathy ([place])    left brachial plexus  . Pain, dental     Past Surgical History:  Procedure Laterality Date  . AORTIC VALVE REPLACEMENT  1994  . GANGLION CYST EXCISION  JAN 2002  . HIATAL HERNIA REPAIR  DEC 2002  . TONSILLECTOMY    . VASECTOMY  DEC 2002    Current Medications: Outpatient Medications Prior to Visit  Medication Sig Dispense Refill  . aspirin 81 MG chewable tablet Chew 81 mg by mouth daily.      Marland Kitchen VIAGRA 100 MG tablet     . atenolol (TENORMIN) 50 MG tablet Take 1 tablet (50 mg total) by mouth daily. 30 tablet 0  . finasteride (PROSCAR) 5 MG tablet Take 1 tablet (5 mg total) by mouth daily. 90 tablet 1  . simvastatin (ZOCOR) 20 MG tablet TAKE 1 TABLET(20 MG) BY MOUTH DAILY 30  tablet 0  . telmisartan (MICARDIS) 80 MG tablet Take 1 tablet (80 mg total) by mouth daily. 30 tablet 0   No facility-administered medications prior to visit.      Allergies:   Penicillins and Quinidine   Social History   Social History  . Marital status: Married    Spouse name: N/A  . Number of children: 3  . Years of education: N/A   Social History Main Topics  . Smoking status: Never Smoker  . Smokeless tobacco: Never Used  . Alcohol use No  . Drug use: No  . Sexual activity: Not Asked   Other Topics Concern  . None   Social History Narrative  . None     Family History:   The patient's family history includes Leukemia in his brother.     ROS:   Please see the history of present illness.    ROS All other systems reviewed and are negative.   PHYSICAL EXAM:   VS:  BP 122/82   Pulse (!) 53   Ht 6\' 3"  (1.905 m)   Wt 229 lb 6.4 oz (104.1 kg)   BMI 28.67 kg/m    GEN: Well nourished, well developed, in no acute distress  HEENT: normal  Neck: no JVD, carotid bruits, or masses Cardiac: RRR; no murmurs, rubs, or gallops,no edema  Respiratory:  clear to auscultation bilaterally, normal work of breathing GI: soft, nontender, nondistended, + BS MS: no deformity or atrophy  Skin: warm and dry, no rash Neuro:  Alert and Oriented x 3, Strength and sensation are intact Psych: euthymic mood, full affect  Wt Readings from Last 3 Encounters:  03/04/16 229 lb 6.4 oz (104.1 kg)  12/02/15 228 lb 6 oz (103.6 kg)  09/12/15 240 lb (108.9 kg)      Studies/Labs Reviewed:   EKG:  EKG is ordered today.  The ekg ordered today demonstrates sinus brady with RBBB and PACs  Recent Labs: 06/03/2015: TSH 1.39 12/02/2015: ALT 18; BUN 16; Creatinine, Ser 0.82; Hemoglobin 14.5; Platelets 259.0; Potassium 4.4; Sodium 137   Lipid Panel    Component Value Date/Time   CHOL 117 12/02/2015 0906   TRIG 85.0 12/02/2015 0906   HDL 33.30 (L) 12/02/2015 0906   CHOLHDL 4 12/02/2015 0906   VLDL 17.0 12/02/2015 0906   LDLCALC 67 12/02/2015 0906   LDLDIRECT 69.8 05/31/2014 1403    Additional studies/ records that were reviewed today include:   Recent Radiology Findings:   Mr Angiogram Chest W Wo Contrast  08/19/2015  CLINICAL DATA:  S/p Ross procedure with dilated aortic root. EXAM: CARDIAC MRI TECHNIQUE: The patient was scanned on a 1.5 Tesla GE magnet. A dedicated cardiac coil was used. FIESTA sequences were done for functional assessment. The patient received 25 cc of Multihance contrast for MR angiography. CONTRAST:  25 cc Multihance FINDINGS: The patient was s/p sternotomy.  Limited images of lung fields showed no gross abnormalities. There was a pulmonary valve and artery autograft in the aortic position. The autograft valve was trileaflet with mild autograft valve insufficiency. There did not appear to be autograft valve stenosis. There was a homograft in the pulmonary position. The homograft valve was thickened with probably mild regurgitation. There was turbulent flow across the homograft valve suggestive of some degree of stenosis (this is better assessed by echo). The autograft in the aortic position had dilated sinuses of valsalva, especially the left coronary sinus (SOV measured 5.2 cm when measured into the left coronary sinus). The coronary  arteries were visualized and appeared patent proximally (coronary buttons re-attached to pulmonary autograft). There was narrowing at the suture line where the autograft was sewed into the native ascending aorta. At the narrowing, the ascending aorta was 2.4 cm in diameter. There did appear to be some turbulent flow at this narrowing. The aortic arch was not dilated. The great vessels had normal origins. The pulmonary veins appeared to drain normally to the left atrium (though the right-sided veins were not well-visualized). MEASUREMENTS: MEASUREMENTS Sinus of valsalva measurement into left cusp 5.2 cm, into right cusp 4.7 cm, into noncoronary cusp 4.2 cm Sinotubular junction 4.5 cm Ascending aorta at narrowing where autograft joined to native aorta 2.4 cm Aortic arch 3.2 cm Descending thoracic aorta 2.6 cm IMPRESSION: 1. Status post Ross procedure with pulmonary valve and artery autograft in aortic position with re-implanted coronary buttons and homograft in the pulmonary position. There was mild autograft valve regurgitation and no significant stenosis. The homograft pulmonary valve was thickened with at least mild regurgitation and suspect a degree of stenosis. 2. There was dilation of the pulmonary autograft in the aortic position at  the sinuses of valsalva, reaching 5.2 cm as measured into the left coronary cusp. There was also narrowing with some turbulent flow at the suture line where autograft was sewed into the native ascending aorta. Sinus of valsalva dilation does not appear to have significant progressed compared to prior MRA. Dalton Mclean Electronically Signed   By: Loralie Champagne M.D.   On: 08/19/2015 23:08     I have independently reviewed the above radiologic studies.  ECHO 12/10/2014: Study Conclusions  - Left ventricle: The cavity size was normal. Wall thickness was normal. Systolic function was normal. The estimated ejection fraction was in the range of 55% to 60%. Wall motion was normal; there were no regional wall motion abnormalities. Left ventricular diastolic function parameters were normal. - Aortic valve: There was mild regurgitation directed eccentrically in the LVOT. - Mitral valve: There was mild regurgitation. - Left atrium: The atrium was mildly dilated. - Right ventricle: Systolic function was mildly reduced. - Right atrium: The atrium was moderately dilated. - Atrial septum: No defect or patent foramen ovale was identified. - Pulmonic valve: The findings are consistent with trivial stenosis. - Pulmonary arteries: RV systolic pressure: 35 mm Hg (S). Impressions: - Compared with the previous report, ther has been no progression of the neo-aortic valve insufficiency or the pulmonic homograft stenosis.   ASSESSMENT & PLAN:   History of Ross Procedure: Mild AI and trivially elevated bioprosthetic pulmonic valve gradient on echo in 5/16. Repeat echo in due now. I will have this arranged.  No exertional dyspnea.   Aortic root aneurym: per Dr. Aundra [name], this not uncommonly accompanies Ross procedure.  Aortic root at sinuses of valsalva measured 4.8 cm by MRA in 5/15 and 5.2 by MRA 1/17. Followed by Dr. Servando [name] and no indication for surgery at this time.  - Continue atenolol  to 50 mg daily and continue Micardis 80mg  daily.   HTN: BP well controlled on current regimen   Medication Adjustments/Labs and Tests Ordered: Current medicines are reviewed at length with the patient today.  Concerns regarding medicines are outlined above.  Medication changes, Labs and Tests ordered today are listed in the Patient Instructions below. Patient Instructions  Medication Instructions:  Your physician recommends that you continue on your current medications as directed. Please refer to the Current Medication list given to you today.   Labwork: None ordered  Testing/Procedures: Your physician has requested that you have an echocardiogram. Echocardiography is a painless test that uses sound waves to create images of your heart. It provides your doctor with information about the size and shape of your heart and how well your heart's chambers and valves are working. This procedure takes approximately one hour. There are no restrictions for this procedure.   Follow-Up: Your physician wants you to follow-up in: [place], PA-C  You will receive a reminder letter in the mail two months in advance. If you don't receive a letter, please call our office to schedule the follow-up appointment. :    Any Other Special Instructions Will Be Listed Below (If Applicable). Echocardiogram An echocardiogram, or echocardiography, uses sound waves (ultrasound) to produce an image of your heart. The echocardiogram is simple, painless, obtained within a short period of time, and offers valuable information to your health care provider. The images from an echocardiogram can provide information such as:  Evidence of coronary artery disease (CAD).  Heart size.  Heart muscle function.  Heart valve function.  Aneurysm detection.  Evidence of a past heart attack.  Fluid buildup around the heart.  Heart muscle thickening.  Assess heart valve function. LET [facility] CARE  PROVIDER KNOW ABOUT:  Any allergies you have.  All medicines you are taking, including vitamins, herbs, eye drops, creams, and over-the-counter medicines.  Previous problems you or members of your family have had with the use of anesthetics.  Any blood disorders you have.  Previous surgeries you have had.  Medical conditions you have.  Possibility of pregnancy, if this applies. BEFORE THE PROCEDURE  No special preparation is needed. Eat and drink normally.  PROCEDURE   In order to produce an image of your heart, gel will be applied to your chest and a wand-like tool (transducer) will be moved over your chest. The gel will help transmit the sound waves from the transducer. The sound waves will harmlessly bounce off your heart to allow the heart images to be captured in real-time motion. These images will then be recorded.  You may need an IV to receive a medicine that improves the quality of the pictures. AFTER THE PROCEDURE You may return to your normal schedule including diet, activities, and medicines, unless your health care provider tells you otherwise.   This information is not intended to replace advice given to you by your health care provider. Make sure you discuss any questions you have with your health care provider.   Document Released: 07/17/2000 Document Revised: 08/10/2014 Document Reviewed: 03/27/2013 Elsevier Interactive Patient Education [employer].     If you need a refill on your cardiac medications before your next appointment, please call your pharmacy.      Signed, Angelena Form, PA-C  03/04/2016 8:50 AM    [facility] [address], [place], [place]  [address] Phone: [phone]; Fax: [phone]

## 2016-03-04 ENCOUNTER — Encounter: Payer: Self-pay | Admitting: Physician Assistant

## 2016-03-04 ENCOUNTER — Other Ambulatory Visit: Payer: Self-pay | Admitting: *Deleted

## 2016-03-04 ENCOUNTER — Ambulatory Visit (INDEPENDENT_AMBULATORY_CARE_PROVIDER_SITE_OTHER): Payer: Commercial Managed Care - HMO | Admitting: Physician Assistant

## 2016-03-04 VITALS — BP 122/82 | HR 53 | Ht 75.0 in | Wt 229.4 lb

## 2016-03-04 DIAGNOSIS — I10 Essential (primary) hypertension: Secondary | ICD-10-CM

## 2016-03-04 DIAGNOSIS — I359 Nonrheumatic aortic valve disorder, unspecified: Secondary | ICD-10-CM

## 2016-03-04 MED ORDER — ATENOLOL 50 MG PO TABS: 50.0000 mg | ORAL_TABLET | Freq: Every day | ORAL | 3 refills | Status: DC

## 2016-03-04 MED ORDER — SIMVASTATIN 20 MG PO TABS: ORAL_TABLET | ORAL | 3 refills | Status: DC

## 2016-03-04 MED ORDER — FINASTERIDE 5 MG PO TABS
5.0000 mg | ORAL_TABLET | Freq: Every day | ORAL | 3 refills | Status: DC
Start: 2016-03-04 — End: 2017-03-24

## 2016-03-04 MED ORDER — TELMISARTAN 80 MG PO TABS: 80.0000 mg | ORAL_TABLET | Freq: Every day | ORAL | 3 refills | Status: DC

## 2016-03-04 NOTE — Patient Instructions (Addendum)
Medication Instructions:  Your physician recommends that you continue on your current medications as directed. Please refer to the Current Medication list given to you today.   Labwork: None ordered  Testing/Procedures: Your physician has requested that you have an echocardiogram. Echocardiography is a painless test that uses sound waves to create images of your heart. It provides your doctor with information about the size and shape of your heart and how well your heart's chambers and valves are working. This procedure takes approximately one hour. There are no restrictions for this procedure.   Follow-Up: Your physician wants you to follow-up in: [place], PA-C  You will receive a reminder letter in the mail two months in advance. If you don't receive a letter, please call our office to schedule the follow-up appointment. :    Any Other Special Instructions Will Be Listed Below (If Applicable). Echocardiogram An echocardiogram, or echocardiography, uses sound waves (ultrasound) to produce an image of your heart. The echocardiogram is simple, painless, obtained within a short period of time, and offers valuable information to your health care provider. The images from an echocardiogram can provide information such as:  Evidence of coronary artery disease (CAD).  Heart size.  Heart muscle function.  Heart valve function.  Aneurysm detection.  Evidence of a past heart attack.  Fluid buildup around the heart.  Heart muscle thickening.  Assess heart valve function. LET [facility] CARE PROVIDER KNOW ABOUT:  Any allergies you have.  All medicines you are taking, including vitamins, herbs, eye drops, creams, and over-the-counter medicines.  Previous problems you or members of your family have had with the use of anesthetics.  Any blood disorders you have.  Previous surgeries you have had.  Medical conditions you have.  Possibility of pregnancy, if this  applies. BEFORE THE PROCEDURE  No special preparation is needed. Eat and drink normally.  PROCEDURE   In order to produce an image of your heart, gel will be applied to your chest and a wand-like tool (transducer) will be moved over your chest. The gel will help transmit the sound waves from the transducer. The sound waves will harmlessly bounce off your heart to allow the heart images to be captured in real-time motion. These images will then be recorded.  You may need an IV to receive a medicine that improves the quality of the pictures. AFTER THE PROCEDURE You may return to your normal schedule including diet, activities, and medicines, unless your health care provider tells you otherwise.   This information is not intended to replace advice given to you by your health care provider. Make sure you discuss any questions you have with your health care provider.   Document Released: 07/17/2000 Document Revised: 08/10/2014 Document Reviewed: 03/27/2013 Elsevier Interactive Patient Education [employer].     If you need a refill on your cardiac medications before your next appointment, please call your pharmacy.

## 2016-03-06 ENCOUNTER — Telehealth: Payer: Self-pay | Admitting: *Deleted

## 2016-03-06 MED ORDER — METOPROLOL TARTRATE 50 MG PO TABS: 50.0000 mg | ORAL_TABLET | Freq: Two times a day (BID) | ORAL | 3 refills | Status: DC

## 2016-03-06 NOTE — Telephone Encounter (Signed)
Either use a different pharmacy to get atenolol or take metoprolol 50 bid until atenolol available.

## 2016-03-06 NOTE — Telephone Encounter (Signed)
Pharmacy aware Dr Aundra Dubin has ordered metoprolol 50mg  bid while atenolol unavailable. I am unable to reach pt, pharmacy states they will notify patient.

## 2016-03-06 NOTE — Telephone Encounter (Signed)
Pharmacy calling about shortage of Atenolol. Wants to know if patient can receive another medication until Atenolol comes in. I let the associate know I will route the message to provider for further actions.

## 2016-03-12 ENCOUNTER — Ambulatory Visit (HOSPITAL_COMMUNITY): Payer: Commercial Managed Care - HMO | Attending: Cardiovascular Disease

## 2016-03-12 ENCOUNTER — Other Ambulatory Visit: Payer: Self-pay

## 2016-03-12 DIAGNOSIS — I119 Hypertensive heart disease without heart failure: Secondary | ICD-10-CM | POA: Insufficient documentation

## 2016-03-12 DIAGNOSIS — I359 Nonrheumatic aortic valve disorder, unspecified: Secondary | ICD-10-CM | POA: Diagnosis not present

## 2016-03-12 DIAGNOSIS — I351 Nonrheumatic aortic (valve) insufficiency: Secondary | ICD-10-CM | POA: Diagnosis not present

## 2016-03-12 DIAGNOSIS — I712 Thoracic aortic aneurysm, without rupture: Secondary | ICD-10-CM | POA: Insufficient documentation

## 2016-03-12 DIAGNOSIS — I340 Nonrheumatic mitral (valve) insufficiency: Secondary | ICD-10-CM | POA: Insufficient documentation

## 2016-03-12 DIAGNOSIS — E785 Hyperlipidemia, unspecified: Secondary | ICD-10-CM | POA: Diagnosis not present

## 2016-03-12 LAB — ECHOCARDIOGRAM COMPLETE
AOASC: 49 cm
CHL CUP MV DEC (S): 218
E decel time: 218 msec
E/e' ratio: 5.9
FS: 31 % (ref 28–44)
IV/PV OW: 0.88
LA diam end sys: 48 mm
LA vol index: 24.5 mL/m2
LA vol: 57 mL
LADIAMINDEX: 2.06 cm/m2
LASIZE: 48 mm
LAVOLA4C: 50 mL
LV E/e'average: 5.9
LV TDI E'LATERAL: 13.5
LVEEMED: 5.9
LVELAT: 13.5 cm/s
LVOT VTI: 18.5 cm
LVOTPV: 86.4 cm/s
MV pk A vel: 55.9 m/s
MVPG: 3 mmHg
MVPKEVEL: 79.7 m/s
P 1/2 time: 581 ms
PW: 10.4 mm — AB (ref 0.6–1.1)
RV LATERAL S' VELOCITY: 12.5 cm/s
RV sys press: 21 mmHg
Reg peak vel: 296 cm/s
TDI e' medial: 4.47
TRMAXVEL: 296 cm/s

## 2016-03-13 ENCOUNTER — Other Ambulatory Visit: Payer: Self-pay

## 2016-03-13 MED ORDER — SIMVASTATIN 20 MG PO TABS: ORAL_TABLET | ORAL | 3 refills | Status: DC

## 2016-04-01 DIAGNOSIS — H00011 Hordeolum externum right upper eyelid: Secondary | ICD-10-CM | POA: Diagnosis not present

## 2016-04-13 DIAGNOSIS — H00012 Hordeolum externum right lower eyelid: Secondary | ICD-10-CM | POA: Diagnosis not present

## 2016-04-13 DIAGNOSIS — H52203 Unspecified astigmatism, bilateral: Secondary | ICD-10-CM | POA: Diagnosis not present

## 2016-04-13 DIAGNOSIS — H5203 Hypermetropia, bilateral: Secondary | ICD-10-CM | POA: Diagnosis not present

## 2016-04-27 ENCOUNTER — Telehealth: Payer: Self-pay | Admitting: Cardiology

## 2016-04-27 MED ORDER — METOPROLOL SUCCINATE ER 100 MG PO TB24: ORAL_TABLET | ORAL | 3 refills | Status: DC

## 2016-04-27 NOTE — Telephone Encounter (Signed)
LMTCB

## 2016-04-27 NOTE — Telephone Encounter (Signed)
New Message:     Please call,pt says he is having problems with his Metoprolol.

## 2016-04-27 NOTE — Telephone Encounter (Signed)
Notified him that Dr. Aundra Dubin will switch him to Toprol XL 100 mg daily.  Also needs to record his BP and HR and call us back in about a week with results. States he takes the Metoprolol at night and will be taking this at night.  States Micardis in the AM.  Will send Rx to [facility].  He verbalizes understanding to call us back with BP readings.

## 2016-04-27 NOTE — Telephone Encounter (Signed)
Stop metoprolol tartrate, start Toprol XL 100 mg daily.  Have him call in with BP and HR.

## 2016-04-27 NOTE — Telephone Encounter (Signed)
Pt states since changing from atenolol (not available from manufacturer)  to metoprolol tartrate 50 mg bid a couple of weeks ago, BP seems to spike mid-morning 140/90, heart rate is the 70's.  Pt states yesterday and today he took an extra 50mg  metoprolol in the morning and that helped his BP, yesterday SBP came down to 111.  Pt states the pharmacist mentioned extended release metoprolol might be an option. Pt is will to try increasing metoprolol tartrate if that is Dr Claris Gladden recommendation.  Pt advised I will forward to Dr Aundra Dubin for review.

## 2016-06-08 ENCOUNTER — Encounter: Payer: Self-pay | Admitting: Family Medicine

## 2016-06-08 ENCOUNTER — Ambulatory Visit (INDEPENDENT_AMBULATORY_CARE_PROVIDER_SITE_OTHER): Payer: Commercial Managed Care - HMO | Admitting: Family Medicine

## 2016-06-08 VITALS — BP 118/80 | HR 56 | Temp 98.0°F | Resp 16 | Ht 75.0 in | Wt 230.4 lb

## 2016-06-08 DIAGNOSIS — Z23 Encounter for immunization: Secondary | ICD-10-CM

## 2016-06-08 DIAGNOSIS — Z0000 Encounter for general adult medical examination without abnormal findings: Secondary | ICD-10-CM

## 2016-06-08 LAB — CBC WITH DIFFERENTIAL/PLATELET
BASOS PCT: 0.4 % (ref 0.0–3.0)
Basophils Absolute: 0 10*3/uL (ref 0.0–0.1)
EOS ABS: 0.1 10*3/uL (ref 0.0–0.7)
EOS PCT: 1.6 % (ref 0.0–5.0)
HEMATOCRIT: 45.4 % (ref 39.0–52.0)
HEMOGLOBIN: 15.4 g/dL (ref 13.0–17.0)
LYMPHS PCT: 23.7 % (ref 12.0–46.0)
Lymphs Abs: 2 10*3/uL (ref 0.7–4.0)
MCHC: 34 g/dL (ref 30.0–36.0)
MCV: 90.2 fl (ref 78.0–100.0)
MONOS PCT: 10.7 % (ref 3.0–12.0)
Monocytes Absolute: 0.9 10*3/uL (ref 0.1–1.0)
Neutro Abs: 5.3 10*3/uL (ref 1.4–7.7)
Neutrophils Relative %: 63.6 % (ref 43.0–77.0)
Platelets: 266 10*3/uL (ref 150.0–400.0)
RBC: 5.03 Mil/uL (ref 4.22–5.81)
RDW: 13.7 % (ref 11.5–15.5)
WBC: 8.4 10*3/uL (ref 4.0–10.5)

## 2016-06-08 LAB — BASIC METABOLIC PANEL
BUN: 15 mg/dL (ref 6–23)
CALCIUM: 9.6 mg/dL (ref 8.4–10.5)
CO2: 28 mEq/L (ref 19–32)
CREATININE: 0.77 mg/dL (ref 0.40–1.50)
Chloride: 104 mEq/L (ref 96–112)
GFR: 108.14 mL/min (ref >60.00)
Glucose, Bld: 98 mg/dL (ref 70–99)
Potassium: 4.1 mEq/L (ref 3.5–5.1)
SODIUM: 139 meq/L (ref 135–145)

## 2016-06-08 LAB — LIPID PANEL
CHOL/HDL RATIO: 3
Cholesterol: 137 mg/dL (ref 0–200)
HDL: 39.9 mg/dL (ref >39.00)
LDL Cholesterol: 74 mg/dL (ref 0–99)
NONHDL: 97.25
Triglycerides: 114 mg/dL (ref 0.0–149.0)
VLDL: 22.8 mg/dL (ref 0.0–40.0)

## 2016-06-08 LAB — PSA: PSA: 5.12 ng/mL — ABNORMAL HIGH (ref 0.10–4.00)

## 2016-06-08 LAB — HEPATIC FUNCTION PANEL
ALT: 20 U/L (ref 0–53)
AST: 16 U/L (ref 0–37)
Albumin: 4.5 g/dL (ref 3.5–5.2)
Alkaline Phosphatase: 56 U/L (ref 39–117)
BILIRUBIN TOTAL: 1.2 mg/dL (ref 0.2–1.2)
Bilirubin, Direct: 0.2 mg/dL (ref 0.0–0.3)
Total Protein: 6.7 g/dL (ref 6.0–8.3)

## 2016-06-08 LAB — TSH: TSH: 2.16 u[IU]/mL (ref 0.35–4.50)

## 2016-06-08 NOTE — Patient Instructions (Signed)
Follow up in 6 months to recheck BP and cholesterol We'll notify you of your lab results and make any changes if needed Continue to work on healthy diet and regular exercise- you look great! Call with any questions or concerns Happy Holidays!!! 

## 2016-06-08 NOTE — Assessment & Plan Note (Signed)
Pt's PE unchanged from previous- s/p Ross procedure w/ abnormal heart sounds and known L brachial plexus palsy.  UTD on colonoscopy, immunizations w/ exception of flu- given today.  Check labs.  Anticipatory guidance provided.

## 2016-06-08 NOTE — Progress Notes (Signed)
   Subjective:    Patient ID: [name] [name], male    DOB: [birth year], 65 y.o.   MRN: [id number]  HPI CPE- UTD on colonoscopy, Tdap, shingles.  Due for flu.  Has been released by urology.   Review of Systems Patient reports no vision/hearing changes, anorexia, fever ,adenopathy, persistant/recurrent hoarseness, swallowing issues, chest pain, palpitations, edema, persistant/recurrent cough, hemoptysis, dyspnea (rest,exertional, paroxysmal nocturnal), gastrointestinal  bleeding (melena, rectal bleeding), abdominal pain, excessive heart burn, GU symptoms (dysuria, hematuria, voiding/incontinence issues) syncope, focal weakness, memory loss, numbness & tingling, skin/hair/nail changes, depression, anxiety, abnormal bruising/bleeding, musculoskeletal symptoms/signs.     Objective:   Physical Exam General Appearance:    Alert, cooperative, no distress, appears stated age  Head:    Normocephalic, without obvious abnormality, atraumatic  Eyes:    PERRL, conjunctiva/corneas clear, EOM's intact, fundi    benign, both eyes       Ears:    Normal TM's and external ear canals, both ears  Nose:   Nares normal, septum midline, mucosa normal, no drainage   or sinus tenderness  Throat:   Lips, mucosa, and tongue normal; teeth and gums normal  Neck:   Supple, symmetrical, trachea midline, no adenopathy;       thyroid:  No enlargement/tenderness/nodules  Back:     Symmetric, no curvature, ROM normal, no CVA tenderness  Lungs:     Clear to auscultation bilaterally, respirations unlabored  Chest wall:    No tenderness or deformity  Heart:    Regular rate and rhythm, galloping heart sounds w/ murmur  Abdomen:     Soft, non-tender, bowel sounds active all four quadrants,    no masses, no organomegaly  Genitalia:    Deferred to urology  Rectal:    Extremities:   Extremities normal, atraumatic, no cyanosis or edema  Pulses:   2+ and symmetric all extremities  Skin:   Skin color, texture, turgor normal, no  rashes or lesions  Lymph nodes:   Cervical, supraclavicular, and axillary nodes normal  Neurologic:   CNII-XII intact. L sided brachial plexus palsy          Assessment & Plan:

## 2016-06-08 NOTE — Progress Notes (Signed)
Pre visit review using our clinic review tool, if applicable. No additional management support is needed unless otherwise documented below in the visit note. 

## 2016-07-03 ENCOUNTER — Encounter: Payer: Self-pay | Admitting: Physician Assistant

## 2016-07-03 ENCOUNTER — Ambulatory Visit (INDEPENDENT_AMBULATORY_CARE_PROVIDER_SITE_OTHER): Payer: Commercial Managed Care - HMO | Admitting: Physician Assistant

## 2016-07-03 VITALS — BP 123/82 | HR 79 | Temp 98.8°F | Resp 16 | Ht 75.0 in | Wt 232.2 lb

## 2016-07-03 DIAGNOSIS — B9789 Other viral agents as the cause of diseases classified elsewhere: Secondary | ICD-10-CM | POA: Diagnosis not present

## 2016-07-03 DIAGNOSIS — J329 Chronic sinusitis, unspecified: Secondary | ICD-10-CM

## 2016-07-03 MED ORDER — FLUTICASONE PROPIONATE 50 MCG/ACT NA SUSP: 2.0000 | Freq: Every day | NASAL | 6 refills | Status: DC

## 2016-07-03 MED ORDER — BENZONATATE 100 MG PO CAPS: 100.0000 mg | ORAL_CAPSULE | Freq: Three times a day (TID) | ORAL | 0 refills | Status: DC | PRN

## 2016-07-03 NOTE — Patient Instructions (Signed)
Please stay well hydrated and get plenty of rest. Run a humidifier in the bedroom. Use Saline nasal spray to flush out nasal passages. Use the Flonase as directed once daily.  Use the Tessalon as directed for cough.  Symptoms should gradually improve/resolve. If there are any worsening symptoms or new symptoms after starting this regimen, please give [place] a call.   Sinusitis, Adult Sinusitis is soreness and inflammation of your sinuses. Sinuses are hollow spaces in the bones around your face. They are located:  Around your eyes.  In the middle of your forehead.  Behind your nose.  In your cheekbones. Your sinuses and nasal passages are lined with a stringy fluid (mucus). Mucus normally drains out of your sinuses. When your nasal tissues get inflamed or swollen, the mucus can get trapped or blocked so air cannot flow through your sinuses. This lets bacteria, viruses, and funguses grow, and that leads to infection. Follow these instructions at home: Medicines  Take, use, or apply over-the-counter and prescription medicines only as told by your doctor. These may include nasal sprays.  If you were prescribed an antibiotic medicine, take it as told by your doctor. Do not stop taking the antibiotic even if you start to feel better. Hydrate and Humidify  Drink enough water to keep your pee (urine) clear or pale yellow.  Use a cool mist humidifier to keep the humidity level in your home above 50%.  Breathe in steam for 10-15 minutes, 3-4 times a day or as told by your doctor. You can do this in the bathroom while a hot shower is running.  Try not to spend time in cool or dry air. Rest  Rest as much as possible.  Sleep with your head raised (elevated).  Make sure to get enough sleep each night. General instructions  Put a warm, moist washcloth on your face 3-4 times a day or as told by your doctor. This will help with discomfort.  Wash your hands often with soap and water. If  there is no soap and water, use hand sanitizer.  Do not smoke. Avoid being around people who are smoking (secondhand smoke).  Keep all follow-up visits as told by your doctor. This is important. Contact a doctor if:  You have a fever.  Your symptoms get worse.  Your symptoms do not get better within 10 days. Get help right away if:  You have a very bad headache.  You cannot stop throwing up (vomiting).  You have pain or swelling around your face or eyes.  You have trouble seeing.  You feel confused.  Your neck is stiff.  You have trouble breathing. This information is not intended to replace advice given to you by your health care provider. Make sure you discuss any questions you have with your health care provider. Document Released: 01/06/2008 Document Revised: 03/15/2016 Document Reviewed: 05/15/2015 Elsevier Interactive Patient Education  2017 [employer].

## 2016-07-03 NOTE — Progress Notes (Signed)
Patient presents to clinic today c/o post-nasal drip and sore throat starting Tuesday morning. Endorses since that time noting some aching, voice hoarseness and dry cough. Denies fever, chills.  Denies chest pain or SOB. Denies history of asthma or COPD. Is a non-smoker.  Endorses taking Nyquil last night which helped him rest. Denies recent travel. Daughter sick with a cold recently and he notes he has been around him.   Past Medical History:  Diagnosis Date  . Acute gastritis   . Aortic insufficiency    s/p aortic valve replacement in 1994  . Early satiety   . Esophageal stricture   . GERD (gastroesophageal reflux disease)   . Hiatal hernia   . HTN (hypertension)   . Hyperlipidemia   . Neuropathy ([place])    left brachial plexus  . Pain, dental     Current Outpatient Prescriptions on File Prior to Visit  Medication Sig Dispense Refill  . aspirin 81 MG chewable tablet Chew 81 mg by mouth daily.      . finasteride (PROSCAR) 5 MG tablet Take 1 tablet (5 mg total) by mouth daily. 90 tablet 3  . metoprolol succinate (TOPROL-XL) 100 MG 24 hr tablet Take one tablet by mouth at HS 90 tablet 3  . simvastatin (ZOCOR) 20 MG tablet TAKE 1 TABLET(20 MG) BY MOUTH DAILY 90 tablet 3  . telmisartan (MICARDIS) 80 MG tablet Take 1 tablet (80 mg total) by mouth daily. 90 tablet 3  . VIAGRA 100 MG tablet      No current facility-administered medications on file prior to visit.     Allergies  Allergen Reactions  . Penicillins Hives and Swelling  . Quinidine Other (See Comments)    Increase heart beat per patient    Family History  Problem Relation Age of Onset  . Hypertension    . Prostate cancer    . Leukemia Brother   . Colon cancer Neg Hx   . Pancreatic cancer Neg Hx   . Rectal cancer Neg Hx   . Stomach cancer Neg Hx     Social History   Social History  . Marital status: Married    Spouse name: N/A  . Number of children: 3  . Years of education: N/A   Social History Main  Topics  . Smoking status: Never Smoker  . Smokeless tobacco: Never Used  . Alcohol use No  . Drug use: No  . Sexual activity: Not Asked   Other Topics Concern  . None   Social History Narrative  . None   Review of Systems - See HPI.  All other ROS are negative.  BP 123/82   Pulse 79   Temp 98.8 F (37.1 C) (Oral)   Resp 16   Ht 6\' 3"  (1.905 m)   Wt 232 lb 4 oz (105.3 kg)   SpO2 97%   BMI 29.03 kg/m   Physical Exam  Constitutional: He is oriented to person, place, and time and well-developed, well-nourished, and in no distress.  HENT:  Head: Normocephalic and atraumatic.  Right Ear: External ear normal.  Left Ear: External ear normal.  Nose: Nose normal.  Mouth/Throat: Oropharynx is clear and moist. No oropharyngeal exudate.  TM within normal limits bilaterally. No TTP of sinuses on examination.  Eyes: Conjunctivae are normal.  Neck: Neck supple.  Cardiovascular: Normal rate, regular rhythm, normal heart sounds and intact distal pulses.   Pulmonary/Chest: Effort normal and breath sounds normal. No respiratory distress. He has no wheezes.  He has no rales. He exhibits no tenderness.  Neurological: He is alert and oriented to person, place, and time.  Skin: Skin is warm and dry. No rash noted.  Psychiatric: Affect normal.  Vitals reviewed.  Recent Results (from the past 2160 hour(s))  Lipid panel     Status: None   Collection Time: 06/08/16  9:45 AM  Result Value Ref Range   Cholesterol 137 0 - 200 mg/dL    Comment: ATP III Classification       Desirable:  < 200 mg/dL               Borderline High:  200 - 239 mg/dL          High:  > = 240 mg/dL   Triglycerides 114.0 0.0 - 149.0 mg/dL    Comment: Normal:  <150 mg/dLBorderline High:  150 - 199 mg/dL   HDL 39.90 >39.00 mg/dL   VLDL 22.8 0.0 - 40.0 mg/dL   LDL Cholesterol 74 0 - 99 mg/dL   Total CHOL/HDL Ratio 3     Comment:                Men          Women1/2 Average Risk     3.4          3.3Average Risk           5.0          4.42X Average Risk          9.6          7.13X Average Risk          15.0          11.0                       NonHDL 97.25     Comment: NOTE:  Non-HDL goal should be 30 mg/dL higher than patient's LDL goal (i.e. LDL goal of < 70 mg/dL, would have non-HDL goal of < 100 mg/dL)  Basic metabolic panel     Status: None   Collection Time: 06/08/16  9:45 AM  Result Value Ref Range   Sodium 139 135 - 145 mEq/L   Potassium 4.1 3.5 - 5.1 mEq/L   Chloride 104 96 - 112 mEq/L   CO2 28 19 - 32 mEq/L   Glucose, Bld 98 70 - 99 mg/dL   BUN 15 6 - 23 mg/dL   Creatinine, Ser 0.77 0.40 - 1.50 mg/dL   Calcium 9.6 8.4 - 10.5 mg/dL   GFR 108.14 >60.00 mL/min  TSH     Status: None   Collection Time: 06/08/16  9:45 AM  Result Value Ref Range   TSH 2.16 0.35 - 4.50 uIU/mL  Hepatic function panel     Status: None   Collection Time: 06/08/16  9:45 AM  Result Value Ref Range   Total Bilirubin 1.2 0.2 - 1.2 mg/dL   Bilirubin, Direct 0.2 0.0 - 0.3 mg/dL   Alkaline Phosphatase 56 39 - 117 U/L   AST 16 0 - 37 U/L   ALT 20 0 - 53 U/L   Total Protein 6.7 6.0 - 8.3 g/dL   Albumin 4.5 3.5 - 5.2 g/dL  CBC with Differential/Platelet     Status: None   Collection Time: 06/08/16  9:45 AM  Result Value Ref Range   WBC 8.4 4.0 - 10.5 K/uL   RBC 5.03 4.22 - 5.81 Mil/uL   Hemoglobin  15.4 13.0 - 17.0 g/dL   HCT 45.4 39.0 - 52.0 %   MCV 90.2 78.0 - 100.0 fl   MCHC 34.0 30.0 - 36.0 g/dL   RDW 13.7 11.5 - 15.5 %   Platelets 266.0 150.0 - 400.0 K/uL   Neutrophils Relative % 63.6 43.0 - 77.0 %   Lymphocytes Relative 23.7 12.0 - 46.0 %   Monocytes Relative 10.7 3.0 - 12.0 %   Eosinophils Relative 1.6 0.0 - 5.0 %   Basophils Relative 0.4 0.0 - 3.0 %   Neutro Abs 5.3 1.4 - 7.7 K/uL   Lymphs Abs 2.0 0.7 - 4.0 K/uL   Monocytes Absolute 0.9 0.1 - 1.0 K/uL   Eosinophils Absolute 0.1 0.0 - 0.7 K/uL   Basophils Absolute 0.0 0.0 - 0.1 K/uL  PSA     Status: Abnormal   Collection Time: 06/08/16  9:45 AM  Result  Value Ref Range   PSA 5.12 (H) 0.10 - 4.00 ng/mL   Assessment/Plan: 1. Viral sinusitis Exam without signs of bacterial infection. No indication for ABX today.  Rx Flonase and Tessalon. Supportive measures and OTC medications reviewed with patient. FU if not resolving.   - benzonatate (TESSALON) 100 MG capsule; Take 1 capsule (100 mg total) by mouth 3 (three) times daily as needed.  Dispense: 30 capsule; Refill: 0 - fluticasone (FLONASE) 50 MCG/ACT nasal spray; Place 2 sprays into both nostrils daily.  Dispense: 16 g; Refill: 6   Leeanne Rio, [place]

## 2016-07-03 NOTE — Progress Notes (Signed)
Pre visit review using our clinic review tool, if applicable. No additional management support is needed unless otherwise documented below in the visit note. 

## 2016-07-06 ENCOUNTER — Telehealth: Payer: Self-pay | Admitting: Family Medicine

## 2016-07-06 MED ORDER — DOXYCYCLINE HYCLATE 100 MG PO TABS: 100.0000 mg | ORAL_TABLET | Freq: Two times a day (BID) | ORAL | 0 refills | Status: DC

## 2016-07-06 NOTE — Telephone Encounter (Signed)
Patient symptoms consist of runny nose, sore throat, cough with yellow production. He states none of his symptoms has improved since office visit. He is currently using medication as directed. No sob, wheezing, fever or chills Please advise

## 2016-07-06 NOTE — Telephone Encounter (Signed)
Please assess current symptoms. Are they just the same, worse or any new symptoms so we can decide on treatment.

## 2016-07-06 NOTE — Telephone Encounter (Signed)
Med filled to local pharmacy and pt informed.
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
## 2016-07-06 NOTE — Telephone Encounter (Signed)
Would recommend Doxycycline 100 mg. 1 tablet by mouth twice daily x 10 days. Ok to send in Rx.

## 2016-07-06 NOTE — Telephone Encounter (Signed)
Please advise 

## 2016-07-06 NOTE — Telephone Encounter (Signed)
LMOVM for current symptoms to see if need for abx.

## 2016-07-24 ENCOUNTER — Telehealth: Payer: Self-pay | Admitting: *Deleted

## 2016-07-24 NOTE — Telephone Encounter (Signed)
Patient called to say that he is not any better from when he was in the office with his cold/cough/congestion, and he was wanting to see or talk to Dr. Birdie Riddle today to see if there is anything else that can be done.   Informed patient that Dr. Birdie Riddle was not in the office today, but that I could see if there was anything that Elyn Aquas could do.  Patient refused appointment with Einar Pheasant, and said that he did not feel that it was the best option for him right now that he would just wait.    I did not get the chance to tell the patient to call if he changed his mind before he hung up.

## 2016-07-30 ENCOUNTER — Encounter: Payer: Self-pay | Admitting: Family Medicine

## 2016-07-30 ENCOUNTER — Ambulatory Visit (INDEPENDENT_AMBULATORY_CARE_PROVIDER_SITE_OTHER): Payer: Commercial Managed Care - HMO | Admitting: Family Medicine

## 2016-07-30 VITALS — BP 130/82 | HR 67 | Temp 98.1°F | Resp 17 | Ht 75.0 in | Wt 235.0 lb

## 2016-07-30 DIAGNOSIS — R058 Other specified cough: Secondary | ICD-10-CM

## 2016-07-30 DIAGNOSIS — R05 Cough: Secondary | ICD-10-CM

## 2016-07-30 MED ORDER — PREDNISONE 10 MG PO TABS: ORAL_TABLET | ORAL | 0 refills | Status: DC

## 2016-07-30 NOTE — Progress Notes (Signed)
   Subjective:    Patient ID: [name] [name], male    DOB: 01-10-1951, 65 y.o.   MRN: [id number]  HPI URI- pt was seen on 12/1 and dx'd w/ viral URI.  No abx at that time.  Pt called back on 12/4 w/ worsening sxs and was started on 10 day course of Doxy.  Continues to use Flonase.  Pt reports he currently has a 'constant cough'- mostly dry.  Sore throat and nasal congestion has resolved.  Continued hoarseness at this time.  'I don't feel bad'.  No fevers, body aches   Review of Systems For ROS see HPI     Objective:   Physical Exam  Constitutional: He is oriented to person, place, and time. He appears well-developed and well-nourished. No distress.  HENT:  Head: Normocephalic and atraumatic.  Right Ear: Tympanic membrane normal.  Left Ear: Tympanic membrane normal.  Nose: No mucosal edema or rhinorrhea. Right sinus exhibits no maxillary sinus tenderness and no frontal sinus tenderness. Left sinus exhibits no maxillary sinus tenderness and no frontal sinus tenderness.  Mouth/Throat: Mucous membranes are normal. No oropharyngeal exudate, posterior oropharyngeal edema or posterior oropharyngeal erythema.  Eyes: Conjunctivae and EOM are normal. Pupils are equal, round, and reactive to light.  Neck: Normal range of motion. Neck supple.  Cardiovascular: Normal rate, regular rhythm and normal heart sounds.   Pulmonary/Chest: Effort normal and breath sounds normal. No respiratory distress. He has no wheezes.  + dry cough  Lymphadenopathy:    He has no cervical adenopathy.  Neurological: He is alert and oriented to person, place, and time.  Skin: Skin is warm and dry.  Psychiatric: He has a normal mood and affect. His behavior is normal. Thought content normal.  Vitals reviewed.         Assessment & Plan:  Post-infectious cough- new.  Pt's sxs and PE are consistent w/ airway inflammation as there is no evidence of infxn on exam.  Pt reports feeling well- which also supports dx.  Start  prednisone.  Pt thinks he has an albuterol inhaler at home- pt instructed on appropriate use for this condition.  Reviewed supportive care and red flags that should prompt return.  Pt expressed understanding and is in agreement w/ plan.

## 2016-07-30 NOTE — Patient Instructions (Signed)
Follow up as needed Start the Prednisone as directed- take w/ food Drink plenty of fluids If you have an albuterol inhaler, 2 puffs every 4 hrs as needed for coughing fits This is not contagious- this is post-infectious airway inflammation and will improve! Call with any questions or concerns Hang in there! Happy New Year!!!

## 2016-07-30 NOTE — Progress Notes (Signed)
Pre visit review using our clinic review tool, if applicable. No additional management support is needed unless otherwise documented below in the visit note. 

## 2016-08-10 DIAGNOSIS — R972 Elevated prostate specific antigen [PSA]: Secondary | ICD-10-CM | POA: Diagnosis not present

## 2016-08-10 DIAGNOSIS — K219 Gastro-esophageal reflux disease without esophagitis: Secondary | ICD-10-CM | POA: Diagnosis not present

## 2016-08-10 DIAGNOSIS — R490 Dysphonia: Secondary | ICD-10-CM | POA: Diagnosis not present

## 2016-08-10 DIAGNOSIS — N400 Enlarged prostate without lower urinary tract symptoms: Secondary | ICD-10-CM | POA: Diagnosis not present

## 2016-08-18 ENCOUNTER — Telehealth: Payer: Self-pay | Admitting: Physician Assistant

## 2016-08-18 NOTE — Telephone Encounter (Signed)
New Message    Pt of Dr Aundra Dubin, not heart failure pt, follows up with Angelena Form, needs to schedule MRI that is ordered will not let us schedule

## 2016-08-21 NOTE — Telephone Encounter (Signed)
Pt calling to schedule MRA of chest for evaluation of dilation of aortic root, last MRA chest done 08/19/15. Pt advised I will forward to [facility] to ask them to contact him to schedule MRA of chest, order is already in Epic.

## 2016-08-21 NOTE — Telephone Encounter (Signed)
Pt is also asking about follow up with Dr Aundra Dubin, notes from echo done 03/12/16 indicate follow up with Dr Aund[facility] with Dr Aundra Dubin in 6-12 months.  Pt advised I will forward to Dr Aundra Dubin for recommendation about follow up with him once the MRA of chest has been done.

## 2016-08-21 NOTE — Telephone Encounter (Signed)
LMTCB to discuss.

## 2016-08-21 NOTE — Telephone Encounter (Signed)
Discussed with patient, will forward message to [facility] asking them to contact pt to schedule an appointment with Dr Aundra Dubin for March or April.

## 2016-08-21 NOTE — Telephone Encounter (Signed)
Go ahead and get MRA chest.  If he is stable symptomatically, see me in March or April.

## 2016-08-26 ENCOUNTER — Telehealth: Payer: Self-pay | Admitting: Cardiology

## 2016-08-26 DIAGNOSIS — K219 Gastro-esophageal reflux disease without esophagitis: Secondary | ICD-10-CM | POA: Diagnosis not present

## 2016-08-26 DIAGNOSIS — J019 Acute sinusitis, unspecified: Secondary | ICD-10-CM | POA: Diagnosis not present

## 2016-08-26 NOTE — Telephone Encounter (Signed)
Follow UP;   Pt was suposed to call Desiree Lucy back today and let her know if his MRI had been scheduled. Pt says it have not been scheduled.

## 2016-08-26 NOTE — Telephone Encounter (Signed)
Informed patient that Webb Silversmith is not here today, but will send her his message. Patient verbalized understanding.

## 2016-08-27 NOTE — Telephone Encounter (Signed)
LM on voice mail for pt that I forward another message to schedulers to contact pt to arrange appt time for MRA chest.

## 2016-09-04 ENCOUNTER — Ambulatory Visit (HOSPITAL_COMMUNITY)
Admission: RE | Admit: 2016-09-04 | Discharge: 2016-09-04 | Disposition: A | Discharge status: Home / Self Care | Payer: Commercial Managed Care - HMO | Source: Ambulatory Visit | Attending: Cardiology | Admitting: Cardiology

## 2016-09-04 DIAGNOSIS — Z954 Presence of other heart-valve replacement: Secondary | ICD-10-CM | POA: Diagnosis not present

## 2016-09-04 DIAGNOSIS — I77810 Thoracic aortic ectasia: Secondary | ICD-10-CM | POA: Insufficient documentation

## 2016-09-04 LAB — CREATININE, SERUM
Creatinine, Ser: 1 mg/dL (ref 0.61–1.24)
GFR calc Af Amer: >60 mL/min (ref >60)
GFR calc non Af Amer: >60 mL/min (ref >60)

## 2016-09-04 MED ORDER — GADOBENATE DIMEGLUMINE 529 MG/ML IV SOLN
20.0000 mL | Freq: Once | INTRAVENOUS | Status: AC | PRN
  Administered 2016-09-04: 20 mL via INTRAVENOUS

## 2016-09-09 NOTE — Telephone Encounter (Signed)
Pt calling back to get MRA scheduled, still has not heard about a date/time for the test-pls call [phone]

## 2016-09-10 ENCOUNTER — Ambulatory Visit (INDEPENDENT_AMBULATORY_CARE_PROVIDER_SITE_OTHER): Payer: Commercial Managed Care - HMO | Admitting: Cardiothoracic Surgery

## 2016-09-10 ENCOUNTER — Encounter: Payer: Self-pay | Admitting: Cardiothoracic Surgery

## 2016-09-10 VITALS — BP 134/86 | HR 63 | Resp 16 | Ht 74.0 in | Wt 225.0 lb

## 2016-09-10 DIAGNOSIS — I712 Thoracic aortic aneurysm, without rupture: Secondary | ICD-10-CM

## 2016-09-10 DIAGNOSIS — I351 Nonrheumatic aortic (valve) insufficiency: Secondary | ICD-10-CM

## 2016-09-10 DIAGNOSIS — Z954 Presence of other heart-valve replacement: Secondary | ICD-10-CM

## 2016-09-10 DIAGNOSIS — I7121 Aneurysm of the ascending aorta, without rupture: Secondary | ICD-10-CM

## 2016-09-10 NOTE — Telephone Encounter (Signed)
Pt is calling for the results of the MRA of chest done 09/04/16. Pt advised Dr Aundra Dubin has not reviewed results yet. Pt advised I will forward message to Dr Aundra Dubin to let him know pt has called for report and will follow up with him later today.

## 2016-09-10 NOTE — Telephone Encounter (Signed)
LMTCB for pt 

## 2016-09-10 NOTE — Telephone Encounter (Signed)
Discussed results of MRA of chest done 09/04/16.

## 2016-09-10 NOTE — Telephone Encounter (Signed)
Follow Up:; ° ° °Returning your call. °

## 2016-09-10 NOTE — Telephone Encounter (Signed)
See my result note for the MRA

## 2016-09-10 NOTE — Progress Notes (Signed)
[address]       [place] [address]             [phone]                    [name] [name] [facility] Medical Record [id number] Date of Birth: [birth year]  Referring: [name], MD Primary Care: Annye Asa, MD  Chief Complaint:    Chief Complaint  Patient presents with  . Follow-up     1 yr with MRI CHEST 09/04/16 and ECHO 06/19/16    History of Present Illness:    [name] [name] 66 y.o. male is seen in the office  today for  Follow up after Harrington Challenger procedure done in [place]   for congenital aortic insufficiency  case 24 years ago. MRA of chest was done recently and patient here to review results . Currently the patient is asymptomatic from the standpoint of heart failure. He denies angina, PND, pedal edema or dyspnea on exertion. He has been noted to have dilatation of his aortic root at the sinuses of Valsalva following his Ross procedure. Reports from echocardiogram in 2003 measured aortic root at 4.9 cm, 2006 4.7 cm, recent MRI 5.1. The patient's mid ascending aorta which is partially native aorta and partial prosthetic graft is not significantly dilated.     1. Aortic insufficiency: s/p Ross procedure in 1994 in [place]. Aortic insufficiency: Echo (4/11) with hypokinetic basal septum (likely post-surgical), EF 50%, mild LVH, s/p Ross procedure with native pulmonic valve in aortic position with mild aortic insufficiency and bioprosthetic pulmonic valve with no pulmonic insufficiency, mild MR, aortic upper normal in size. Echo (4/13) with EF 55%, mild LVH, mild AI, mild MR, mild RV dilation, bioprosthetic pulmonic valve with peak pressure 25 mmHg. Echo (3/14): Mild LV dilation, mild LVH, EF 60%, pulmonary valve in aortic position (s/p Ross) with mild AI and mildly dilated ascending aorta to 4.3 cm, mildly dilated RV with normal systolic function, bioprosthetic PVR with mean gradient 23 mmHg. Echo (3/15) with EF 55-60%, mild LVH, mild AI with no  AS, bioprosthetic pulmonic valve with peak gradient 31 mmHg, ascending aorta 4.4 cm. Cardiac MRI/MRA chest (5/15) with mild regurgitation of autograft aortic valve and mild regurgitation of homograft pulmonic valve, dilation of aortic root at sinuses of valsalva (4.8 cm), EF 6%, no LGE, mild RV dilation with normal RV systolic function. Echo (5/16) with EF 55-60%, mild autograft AI, mild MR, mildly decreased RV systolic function, biatrial enlargement, RVSP 35 mmHg, trivially increased gradient across bioprosthetic pulmonic valve (no progression).  2. Post-operative atrial fibrillation after heart surgery.  3. Normal left heart cath prior to 1994 heart surgery.  4. Left brachial plexopathy with left shoulder muscle atrophy. Uncertain etiology.  5. BPH 6. Hyperlipidemia.  7. Cervical spinal stenosis with radiculopathy.  8. 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  9. ETT-Sestamibi (3/14): No ischemia or infarction.  10. Carotid dopplers (6/14): mild disease only.      Current Activity/ Functional Status:  Patient is independent with mobility/ambulation, transfers, ADL's, IADL's.   Zubrod Score: At the time of surgery this patient's most appropriate activity status/level should be described as: [x]     0    Normal activity, no symptoms []     1    Restricted in physical strenuous activity but ambulatory, able to do out light work []   2    Ambulatory and capable of self care, unable to do work activities, up and about               >50 % of waking hours                              []     3    Only limited self care, in bed greater than 50% of waking hours []     4    Completely disabled, no self care, confined to bed or chair []     5    Moribund   Past Medical History:  Diagnosis Date  . Acute gastritis   . Aortic insufficiency    s/p aortic valve replacement in 1994  . Early satiety   . Esophageal  stricture   . GERD (gastroesophageal reflux disease)   . Hiatal hernia   . HTN (hypertension)   . Hyperlipidemia   . Neuropathy ([place])    left brachial plexus  . Pain, dental     Past Surgical History:  Procedure Laterality Date  . AORTIC VALVE REPLACEMENT  1994  . GANGLION CYST EXCISION  JAN 2002  . HIATAL HERNIA REPAIR  DEC 2002  . TONSILLECTOMY    . VASECTOMY  DEC 2002    Family History  Problem Relation Age of Onset  . Hypertension    . Prostate cancer    . Leukemia Brother   . Colon cancer Neg Hx   . Pancreatic cancer Neg Hx   . Rectal cancer Neg Hx   . Stomach cancer Neg Hx     Social History   Social History  . Marital status: Married    Spouse name: N/A  . Number of children: 3  . Years of education: N/A   Occupational History  . Not on file.   Social History Main Topics  . Smoking status: Never Smoker  . Smokeless tobacco: Never Used  . Alcohol use No  . Drug use: No  . Sexual activity: Not on file   Other Topics Concern  . Not on file   Social History Narrative  . No narrative on file    History  Smoking Status  . Never Smoker  Smokeless Tobacco  . Never Used    History  Alcohol Use No     Allergies  Allergen Reactions  . Penicillins Hives and Swelling  . Quinidine Other (See Comments)    Increase heart beat per patient    Current Outpatient Prescriptions  Medication Sig Dispense Refill  . aspirin 81 MG chewable tablet Chew 81 mg by mouth daily.      . finasteride (PROSCAR) 5 MG tablet Take 1 tablet (5 mg total) by mouth daily. 90 tablet 3  . fluticasone (FLONASE) 50 MCG/ACT nasal spray Place 2 sprays into both nostrils daily. 16 g 6  . metoprolol succinate (TOPROL-XL) 100 MG 24 hr tablet Take one tablet by mouth at HS 90 tablet 3  . simvastatin (ZOCOR) 20 MG tablet TAKE 1 TABLET(20 MG) BY MOUTH DAILY 90 tablet 3  . telmisartan (MICARDIS) 80 MG tablet Take 1 tablet (80 mg total) by mouth daily. 90 tablet 3  . VIAGRA 100 MG  tablet      No current facility-administered medications for this visit.       Review of Systems:     Cardiac Review of Systems: Y or N  Chest Pain Florencio.Farrier    ]  Resting SOB [n   ] Exertional SOB  [n  ]  Orthopnea [ n ]   Pedal Edema [ n  ]    Palpitations [ n ] Syncope  [ n ]   Presyncope [ n  ]  General Review of Systems: [Y] = yes [  ]=no Constitional: recent weight change [n  ];  Wt loss over the last 3 months [   ] anorexia [  ]; fatigue [n  ]; nausea [  ]; night sweats [ n ]; fever [n  ]; or chills [  ];          Dental: poor dentition[n  ]; Last Dentist visit:   Eye : blurred vision [  ]; diplopia [   ]; vision changes [  ];  Amaurosis fugax[  ]; Resp: cough [  ];  wheezing[  ];  hemoptysis[  ]; shortness of breath[  ]; paroxysmal nocturnal dyspnea[  ]; dyspnea on exertion[  ]; or orthopnea[  ];  GI:  gallstones[  ], vomiting[  ];  dysphagia[  ]; melena[  ];  hematochezia [  ]; heartburn[  ];   Hx of  Colonoscopy[  ]; GU: kidney stones [  ]; hematuria[  ];   dysuria [  ];  nocturia[  ];  history of     obstruction [  ]; urinary frequency [  ]             Skin: rash, swelling[  ];, hair loss[  ];  peripheral edema[  ];  or itching[  ]; Musculosketetal: myalgias[  ];  joint swelling[  ];  joint erythema[  ];  joint pain[  ];  back pain[  ];  Heme/Lymph: bruising[  ];  bleeding[  ];  anemia[  ];  Neuro: TIA[transient rt eye blindness evaluated by neurology  ];  headaches[  ];  stroke[  ];  vertigo[  ];  seizures[  ];   paresthesias[  ];  difficulty walking[  ];  Psych:depression[ n ]; anxiety[  ];  Endocrine: diabetes[  ];  thyroid dysfunction[  ];  Immunizations: Flu up to date [ y ]; Pneumococcal up to date [ y ];  Other:  Physical Exam: BP 134/86 (BP Location: Right Arm, Patient Position: Sitting, Cuff Size: Large)   Pulse 63   Resp 16   Ht 6\' 2"  (1.88 m)   Wt 225 lb (102.1 kg)   SpO2 98% Comment: ON RA  BMI 28.89 kg/m   PHYSICAL EXAMINATION: General appearance: alert,  cooperative, appears stated age and no distress Head: Normocephalic, without obvious abnormality, atraumatic Neck: no adenopathy, no carotid bruit, no JVD, supple, symmetrical, trachea midline and thyroid not enlarged, symmetric, no tenderness/mass/nodules Lymph nodes: Cervical, supraclavicular, and axillary nodes normal. Resp: clear to auscultation bilaterally Back: symmetric, no curvature. ROM normal. No CVA tenderness. Cardio: regular rate and rhythm, S1,  widely split S2, no S3, 2/6 early SEM radiating to the neck.No change from exam one year ago  GI: soft, non-tender; bowel sounds normal; no masses,  no organomegaly Extremities: extremities normal, atraumatic, no cyanosis or edema and Homans sign is negative, no sign of DVT, left should atrophy, plexus inj Neurologic: Alert and oriented X 3, normal strength and tone. Normal symmetric reflexes. Normal coordination and gait  Diagnostic Studies & Laboratory data:     Recent Radiology Findings:    Mr Angiogram Chest W Wo Contrast  Result Date: 09/09/2016 CLINICAL DATA:  Status post Harrington Challenger procedure with  follow-up of dilated aortic root. EXAM: MRA CHEST WITH OR WITHOUT CONTRAST TECHNIQUE: Preliminary sequences were obtained prior to administration of contrast. Angiographic images of the chest were obtained using MRA technique with intravenous contrast. CONTRAST:  83mL MULTIHANCE GADOBENATE DIMEGLUMINE 529 MG/ML IV SOLN COMPARISON:  08/19/2015 FINDINGS: VASCULAR Aorta: Dilatation of the pulmonary autograft at the level of the sinuses of Valsalva appears grossly stable. Maximal diameter measuring into the left cusp is approximately 5.4 cm. Diameter measuring into the right cusp is approximately 5.1 cm. Diameter measuring into the non coronary cusp is approximately 5.0 cm. Anastomosis of autograft and native aorta appears stable and measures 2.4 cm. Ascending aorta measures 3.3 cm. Aortic arch measures 3 cm. Descending thoracic aorta measures 2.9 cm. No  evidence of aortic dissection. Proximal great vessels show stable and normal patency. Heart: Heart size appears stable and normal. No pericardial fluid identified. Pulmonary Vasculature: No dilatation of pulmonary arteries. Stable appearance of central pulmonary veins. NON-VASCULAR Stable elevation of left hemidiaphragm. No lymphadenopathy or masses identified. No pleural fluid or pulmonary abnormalities identified. IMPRESSION: Stable dilatation of pulmonary autograft at the level of the sinuses of Valsalva. Stable relative narrowing of autograft anastomosis with the native aorta. No evidence of aortic dissection. Electronically Signed   By: Aletta Edouard M.D.   On: 09/09/2016 13:43    Mr Angiogram Chest W Wo Contrast  08/19/2015  CLINICAL DATA:  S/p Ross procedure with dilated aortic root. EXAM: CARDIAC MRI TECHNIQUE: The patient was scanned on a 1.5 Tesla GE magnet. A dedicated cardiac coil was used. FIESTA sequences were done for functional assessment. The patient received 25 cc of Multihance contrast for MR angiography. CONTRAST:  25 cc Multihance FINDINGS: The patient was s/p sternotomy. Limited images of lung fields showed no gross abnormalities. There was a pulmonary valve and artery autograft in the aortic position. The autograft valve was trileaflet with mild autograft valve insufficiency. There did not appear to be autograft valve stenosis. There was a homograft in the pulmonary position. The homograft valve was thickened with probably mild regurgitation. There was turbulent flow across the homograft valve suggestive of some degree of stenosis (this is better assessed by echo). The autograft in the aortic position had dilated sinuses of valsalva, especially the left coronary sinus (SOV measured 5.2 cm when measured into the left coronary sinus). The coronary arteries were visualized and appeared patent proximally (coronary buttons re-attached to pulmonary autograft). There was narrowing at the suture  line where the autograft was sewed into the native ascending aorta. At the narrowing, the ascending aorta was 2.4 cm in diameter. There did appear to be some turbulent flow at this narrowing. The aortic arch was not dilated. The great vessels had normal origins. The pulmonary veins appeared to drain normally to the left atrium (though the right-sided veins were not well-visualized). MEASUREMENTS: MEASUREMENTS Sinus of valsalva measurement into left cusp 5.2 cm, into right cusp 4.7 cm, into noncoronary cusp 4.2 cm Sinotubular junction 4.5 cm Ascending aorta at narrowing where autograft joined to native aorta 2.4 cm Aortic arch 3.2 cm Descending thoracic aorta 2.6 cm IMPRESSION: 1. Status post Ross procedure with pulmonary valve and artery autograft in aortic position with re-implanted coronary buttons and homograft in the pulmonary position. There was mild autograft valve regurgitation and no significant stenosis. The homograft pulmonary valve was thickened with at least mild regurgitation and suspect a degree of stenosis. 2. There was dilation of the pulmonary autograft in the aortic position at the sinuses of  valsalva, reaching 5.2 cm as measured into the left coronary cusp. There was also narrowing with some turbulent flow at the suture line where autograft was sewed into the native ascending aorta. Sinus of valsalva dilation does not appear to have significant progressed compared to prior MRA. Dalton Mclean Electronically Signed   By: Loralie Champagne M.D.   On: 08/19/2015 23:08     I have independently reviewed the above radiologic studies.  ECHO 03/2016: Study Conclusions Patient:    Raquan, Carrol MR #:       [id number] Study Date: 03/12/2016 Gender:     M Age:        49 Height:     190.5 cm Weight:     104.1 kg BSA:        2.36 m^2 Pt. Status: Room:   SONOGRAPHER  Wyatt Mage, RDCS  PERFORMING   [facility], Outpatient  ATTENDING    Eileen Stanford  ORDERING     Eileen Stanford   REFERRING    Angelena Form R  cc:  ------------------------------------------------------------------- LV EF: 55% -   60%  ------------------------------------------------------------------- Indications:      Aortic valve disease (I35.9).  ------------------------------------------------------------------- History:   PMH:   Dyspnea.  Risk factors:  Thoracic aortic aneurysm. Hypertension. Dyslipidemia.  ------------------------------------------------------------------- Study Conclusions  - Left ventricle: The cavity size was normal. Wall thickness was   normal. Systolic function was normal. The estimated ejection   fraction was in the range of 55% to 60%. Wall motion was normal;   there were no regional wall motion abnormalities. Left   ventricular diastolic function parameters were normal. - Ventricular septum: Septal motion showed paradox. - Aortic valve: A valve autograft (aortic, Ross procedure) was   present and functioning normally. There was mild regurgitation. - Mitral valve: There was mild regurgitation. - Left atrium: The atrium was mildly dilated. - Right ventricle: The cavity size was moderately dilated. Wall   thickness was normal. Systolic pressure was increased slightly   due to mild pulmonic valve homograft stenosis. - Right atrium: The atrium was mildly dilated. - Pulmonic valve: A pulmonic root homograft was present. The   findings are consistent with mild stenosis. Peak gradient (S): 22   mm Hg.  Impressions:  - Recommend CT angiography or MRI study for more accurate   measurement of the ascending aorta.  ------------------------------------------------------------------- Labs, prior tests, procedures, and surgery: Transthoracic echocardiography (12/10/2014).    The aortic valve showed mild regurgitation.  EF was 60%. Aortic valve: mean gradient of 2 mm Hg.  Valve surgery.     Aortic valve replacement. Pulmonic autograft. Pulmonic valve  replacement with a homograft. Surgery.    Ross procedure.  ------------------------------------------------------------------- Study data:  Comparison was made to the study of 12/10/2014.  Study status:  Routine.  Procedure:  The patient reported no pain pre or post test. Transthoracic echocardiography. Image quality was adequate.  Study completion:  There were no complications. Transthoracic echocardiography.  M-mode, complete 2D, spectral Doppler, and color Doppler.  Birthdate:  Patient birthdate: [birth year].  Age:  Patient is 66 yr old.  Sex:  Gender: male. BMI: 28.7 kg/m^2.  Blood pressure:     122/82  Patient status: Outpatient.  Study date:  Study date: 03/12/2016. Study time: 03:49 PM.  Location:  [facility]  -------------------------------------------------------------------  ------------------------------------------------------------------- Left ventricle:  The cavity size was normal. Wall thickness was normal. Systolic function was normal. The estimated ejection fraction was in the range of 55% to  60%. Wall motion was normal; there were no regional wall motion abnormalities. The transmitral flow pattern was normal. The deceleration time of the early transmitral flow velocity was normal. The pulmonary vein flow pattern was normal. The tissue Doppler parameters were normal. Left ventricular diastolic function parameters were normal.  ------------------------------------------------------------------- Aortic valve:   Normal thickness leaflets. A valve autograft (aortic, Ross procedure) was present and functioning normally. Mobility was not restricted.  Doppler:  Transvalvular velocity was within the normal range. There was no stenosis. There was mild regurgitation.    VTI ratio of LVOT to aortic valve: 0.86. Peak velocity ratio of LVOT to aortic valve: 0.82. Mean velocity ratio of LVOT to aortic valve: 0.76.    Mean gradient (S): 2 mm Hg. Peak gradient (S): 4 mm  Hg.  ------------------------------------------------------------------- Aorta:  The aorta was poorly visualized. Aortic root: The aortic root was normal in size. Ascending aorta: The ascending aorta was severely dilated. Lesion: There was a mild coarctation at the arch-descending aorta junction. Peak velocity across the coarctation is 220 cm/s.  ------------------------------------------------------------------- Mitral valve:   Structurally normal valve.   Mobility was not restricted.  Doppler:  Transvalvular velocity was within the normal range. There was no evidence for stenosis. There was mild regurgitation.    Peak gradient (D): 3 mm Hg.  ------------------------------------------------------------------- Left atrium:  The atrium was mildly dilated.  ------------------------------------------------------------------- Right ventricle:  The cavity size was moderately dilated. Wall thickness was normal. Systolic function was normal. Systolic pressure was increased slightly due to mild pulmonic valve homograft stenosis.  ------------------------------------------------------------------- Ventricular septum:   Septal motion showed paradox.  ------------------------------------------------------------------- Pulmonic valve:   A pulmonic root homograft was present.  Doppler:  The findings are consistent with mild stenosis.   There was no significant regurgitation. Peak gradient (S): 22 mm Hg.  ------------------------------------------------------------------- Tricuspid valve:   Structurally normal valve.    Doppler: Transvalvular velocity was within the normal range. There was mild regurgitation.  ------------------------------------------------------------------- Pulmonary artery:   The main pulmonary artery was normal-sized. Systolic pressure was within the normal range.  ------------------------------------------------------------------- Right atrium:  The atrium  was mildly dilated.  ------------------------------------------------------------------- Pericardium:  There was no pericardial effusion.  ------------------------------------------------------------------- Systemic veins: Inferior vena cava: The vessel was normal in size.  ------------------------------------------------------------------- Post procedure conclusions Ascending Aorta:  - The aorta was poorly visualized.  ------------------------------------------------------------------- Measurements   Left ventricle                             Value         Reference  LV ID, ED, PLAX chordal            (H)     57.7   mm     43 - 52  LV ID, ES, PLAX chordal            (H)     40.1   mm     23 - 38  LV fx shortening, PLAX chordal             31     %      >=29  LV PW thickness, ED                        10.4   mm     ---------  IVS/LV PW ratio, ED  0.88          <=1.3  LV e&', lateral                             13.5   cm/s   ---------  LV E/e&', lateral                           5.9           ---------  LV e&', medial                              4.47   cm/s   ---------  LV E/e&', medial                            17.83         ---------  LV e&', average                             8.99   cm/s   ---------  LV E/e&', average                           8.87          ---------    Ventricular septum                         Value         Reference  IVS thickness, ED                          9.13   mm     ---------    LVOT                                       Value         Reference  LVOT peak velocity, S                      86.4   cm/s   ---------  LVOT mean velocity, S                      52.7   cm/s   ---------  LVOT VTI, S                                18.5   cm     ---------    Aortic valve                               Value         Reference  Aortic valve peak velocity, S              105    cm/s   ---------  Aortic valve mean velocity, S               69     cm/s   ---------  Aortic valve VTI, S  21.6   cm     ---------  Aortic mean gradient, S                    2      mm Hg  ---------  Aortic peak gradient, S                    4      mm Hg  ---------  VTI ratio, LVOT/AV                         0.86          ---------  Velocity ratio, peak, LVOT/AV              0.82          ---------  Velocity ratio, mean, LVOT/AV              0.76          ---------  Aortic regurg pressure half-time           581    ms     ---------    Aorta                                      Value         Reference  Aortic root ID, ED                         46     mm     ---------  Ascending aorta ID, A-P, S                 49     mm     ---------    Left atrium                                Value         Reference  LA ID, A-P, ES                             44.85  mm     ---------  LA ID/bsa, A-P                             1.9    cm/m^2 <=2.2  LA volume, S                               57     ml     ---------  LA volume/bsa, S                           24.1   ml/m^2 ---------  LA volume, ES, 1-p A4C                     50     ml     ---------  LA volume/bsa, ES, 1-p A4C                 21.2   ml/m^2 ---------  LA volume, ES, 1-p A2C  62     ml     ---------  LA volume/bsa, ES, 1-p A2C                 26.2   ml/m^2 ---------    Mitral valve                               Value         Reference  Mitral E-wave peak velocity                79.7   cm/s   ---------  Mitral A-wave peak velocity                55.9   cm/s   ---------  Mitral deceleration time                   218    ms     150 - 230  Mitral peak gradient, D                    3      mm Hg  ---------  Mitral E/A ratio, peak                     1.4           ---------    Pulmonary arteries                         Value         Reference  PA pressure, S, DP                         16     mm Hg  <=30    Tricuspid valve                            Value          Reference  Tricuspid regurg peak velocity             274.64 cm/s   ---------  Tricuspid peak RV-RA gradient              30     mm Hg  ---------  Tricuspid maximal regurg velocity,         274.64 cm/s   ---------  PISA    Systemic veins                             Value         Reference  Estimated CVP                              8      mm Hg  ---------    Right ventricle                            Value         Reference  RV ID, minor axis, ED, A4C         (H)     47.38  mm     26 - 43  RV pressure, S, DP                 (  H)     38     mm Hg  <=30  RV s&', lateral, S                          12.5   cm/s   ---------    Pulmonic valve                             Value         Reference  Pulmonic valve peak velocity, S            236    cm/s   ---------  Pulmonic peak gradient, S                  22     mm Hg  ---------  Legend: (L)  and  (H)  mark values outside specified reference range.  ------------------------------------------------------------------- Prepared and Electronically Authenticated by  Sanda Klein, MD 2017-08-10T17:21:16   Recent Lab Findings: Lab Results  Component Value Date   WBC 8.4 06/08/2016   HGB 15.4 06/08/2016   HCT 45.4 06/08/2016   PLT 266.0 06/08/2016   GLUCOSE 98 06/08/2016   CHOL 137 06/08/2016   TRIG 114.0 06/08/2016   HDL 39.90 06/08/2016   LDLDIRECT 69.8 05/31/2014   LDLCALC 74 06/08/2016   ALT 20 06/08/2016   AST 16 06/08/2016   NA 139 06/08/2016   K 4.1 06/08/2016   CL 104 06/08/2016   CREATININE 1.00 09/04/2016   BUN 15 06/08/2016   CO2 28 06/08/2016   TSH 2.16 06/08/2016   HGBA1C 6.1 12/03/2015      Assessment / Plan:   Dilated aortic root without Mild AI , 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  I would not recommend repeat surgery at this time without AI if develops further dilation with increasing AI consider redo root replacement and poss pulmonic homograft as needed. MRA appears  stable Last echo 03/2016  follow up echo this year per cardiology Will see back in 12  Months, repeat mra , echo and MRI to be arranged by cardiology discussed with him endocarditis precautions   and avoiding weight lifting [employer]   Patient also has incidental note of an elevated left diaphragm, this is not commented on an old chest x-ray reports, but as noted on previous MRI scans. 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   Grace Isaac MD      [address] [place] [address] Office [phone]   Beeper [phone]  09/10/2016 10:12 AM

## 2016-09-16 DIAGNOSIS — R490 Dysphonia: Secondary | ICD-10-CM | POA: Diagnosis not present

## 2016-09-16 DIAGNOSIS — K219 Gastro-esophageal reflux disease without esophagitis: Secondary | ICD-10-CM | POA: Diagnosis not present

## 2016-09-28 DIAGNOSIS — L821 Other seborrheic keratosis: Secondary | ICD-10-CM | POA: Diagnosis not present

## 2016-09-28 DIAGNOSIS — L905 Scar conditions and fibrosis of skin: Secondary | ICD-10-CM | POA: Diagnosis not present

## 2016-09-28 DIAGNOSIS — Z86018 Personal history of other benign neoplasm: Secondary | ICD-10-CM | POA: Diagnosis not present

## 2016-09-28 DIAGNOSIS — Z23 Encounter for immunization: Secondary | ICD-10-CM | POA: Diagnosis not present

## 2016-09-28 DIAGNOSIS — D225 Melanocytic nevi of trunk: Secondary | ICD-10-CM | POA: Diagnosis not present

## 2016-09-28 DIAGNOSIS — L918 Other hypertrophic disorders of the skin: Secondary | ICD-10-CM | POA: Diagnosis not present

## 2016-09-28 DIAGNOSIS — D1800 Hemangioma unspecified site: Secondary | ICD-10-CM | POA: Diagnosis not present

## 2016-09-28 DIAGNOSIS — L814 Other melanin hyperpigmentation: Secondary | ICD-10-CM | POA: Diagnosis not present

## 2016-09-28 DIAGNOSIS — D2271 Melanocytic nevi of right lower limb, including hip: Secondary | ICD-10-CM | POA: Diagnosis not present

## 2016-10-05 ENCOUNTER — Ambulatory Visit (HOSPITAL_COMMUNITY)
Admission: RE | Admit: 2016-10-05 | Discharge: 2016-10-05 | Disposition: A | Discharge status: Home / Self Care | Payer: Commercial Managed Care - HMO | Source: Ambulatory Visit | Attending: Cardiology | Admitting: Cardiology

## 2016-10-05 ENCOUNTER — Encounter (HOSPITAL_COMMUNITY): Payer: Self-pay

## 2016-10-05 VITALS — BP 136/90 | HR 73 | Wt 241.5 lb

## 2016-10-05 DIAGNOSIS — I712 Thoracic aortic aneurysm, without rupture: Secondary | ICD-10-CM | POA: Diagnosis not present

## 2016-10-05 DIAGNOSIS — I4891 Unspecified atrial fibrillation: Secondary | ICD-10-CM | POA: Insufficient documentation

## 2016-10-05 DIAGNOSIS — I719 Aortic aneurysm of unspecified site, without rupture: Secondary | ICD-10-CM

## 2016-10-05 DIAGNOSIS — Z954 Presence of other heart-valve replacement: Secondary | ICD-10-CM | POA: Diagnosis not present

## 2016-10-05 DIAGNOSIS — E785 Hyperlipidemia, unspecified: Secondary | ICD-10-CM | POA: Diagnosis not present

## 2016-10-05 DIAGNOSIS — Z7982 Long term (current) use of aspirin: Secondary | ICD-10-CM | POA: Diagnosis not present

## 2016-10-05 DIAGNOSIS — I10 Essential (primary) hypertension: Secondary | ICD-10-CM | POA: Insufficient documentation

## 2016-10-05 DIAGNOSIS — I716 Thoracoabdominal aortic aneurysm, without rupture, unspecified: Secondary | ICD-10-CM

## 2016-10-05 DIAGNOSIS — N400 Enlarged prostate without lower urinary tract symptoms: Secondary | ICD-10-CM | POA: Insufficient documentation

## 2016-10-05 DIAGNOSIS — Z8249 Family history of ischemic heart disease and other diseases of the circulatory system: Secondary | ICD-10-CM | POA: Diagnosis not present

## 2016-10-05 DIAGNOSIS — Z9889 Other specified postprocedural states: Secondary | ICD-10-CM | POA: Insufficient documentation

## 2016-10-05 DIAGNOSIS — Z8042 Family history of malignant neoplasm of prostate: Secondary | ICD-10-CM | POA: Diagnosis not present

## 2016-10-05 DIAGNOSIS — I7121 Aneurysm of the ascending aorta, without rupture: Secondary | ICD-10-CM

## 2016-10-05 DIAGNOSIS — I77810 Thoracic aortic ectasia: Secondary | ICD-10-CM

## 2016-10-05 NOTE — Progress Notes (Signed)
Patient ID: [name] [name], male   DOB: 1950-10-24, 66 y.o.   MRN: [id number] PCP: Dr. Birdie Riddle Cardiology: Dr. Aundra Dubin  66 yo with history of aortic insufficiency s/p Ross procedure and aortic root aneurysm presents for cardiology followup.  He has a left-sided brachial plexopathy of uncertain etiology and has atrophy of his left shoulder and upper arm.  He has developed weakness in his right hand as well as occasional vertigo-type spells.  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    He has been stable symptomatically.  No exertional dyspnea or chest pain. He walks and plays golf for exercise.  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  Weight is down 7 lbs.  Most recent echo in 8/17 showed mild AI and no AS of autograft in aortic position, bioprosthetic pulmonic valve with mild pulmonic stenosis.  MRA chest 2/18 showed 5.4 cm dilation of pulmonary autograft at level of sinuses of Valsalva.  He is being followed for aortic root aneurysm by Dr. Servando Snare.   Labs (2/14): K 3.7, creatinine 0.9, LDL 63, HDL 29 Labs (3/14): BNP 36 Labs (2/15): K 3.7, creatinine 0.8, LDL 61, HDL 30 Labs (4/16): K 3.7, creatinine 0.8, LDL 51, HDL 34 Labs (5/16): HCT 44.9 Labs (11/17): LDL 74, HDL 40, K 4.1, creatinine 0.77  ECG (8/17, personally reviewed): NSR, RBBB  PMH: 1. Aortic insufficiency: s/p Ross procedure in 1994 in [place].   - Echo (4/11) with hypokinetic basal septum (likely post-surgical), EF 50%, mild LVH, s/p Ross procedure with native pulmonic valve in aortic position with mild aortic insufficiency and bioprosthetic pulmonic valve with no pulmonic insufficiency, mild MR, aortic upper normal in size.  - Echo (4/13) with  EF 55%, mild LVH, mild AI, mild MR, mild RV dilation, bioprosthetic pulmonic valve with peak pressure 25 mmHg.   - Echo (3/14): Mild LV dilation, mild LVH, EF 60%, pulmonary valve in aortic position (s/p Ross) with mild AI and mildly dilated ascending aorta to 4.3 cm, mildly dilated RV with normal systolic function, bioprosthetic PVR with mean gradient 23 mmHg.  Echo (3/15) with EF 55-60%, mild LVH, mild AI with no AS, bioprosthetic pulmonic valve with peak gradient 31 mmHg, ascending aorta 4.4 cm.   - Cardiac MRI/MRA chest (5/15) with mild regurgitation of autograft aortic valve and mild regurgitation of homograft pulmonic valve, dilation of aortic root at sinuses of valsalva (4.8 cm), EF 60%, no LGE, mild RV dilation with normal RV systolic function.   - Echo (5/16) with EF 55-60%, mild autograft AI, mild MR, mildly decreased RV systolic function, biatrial enlargement, RVSP 35 mmHg, trivially increased gradient across bioprosthetic pulmonic valve (no progression).  - Echo (8/17): EF 55-60%, pulmonary autograft in aortic valve position, mild AI, bioprosthetic pulmonary valve with mild PS (peak 22 mmHg), moderate RV dilation with normal systolic function.  2. Post-operative atrial fibrillation after heart surgery.  3. Normal left heart cath prior to 1994 heart surgery.  4. Left brachial plexopathy with left shoulder muscle atrophy.  Uncertain etiology.  5. BPH 6. Hyperlipidemia.  7. Cervical spinal stenosis with radiculopathy.   8. 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  9. ETT-Sestamibi (3/14): No ischemia or infarction.  10. Carotid dopplers (6/14): mild disease only.  11. Sinus of valsalva aneurysm: MRA chest 2/18 with dilation of pulmonary autograft at level of sinuses of Valsalva to 5.4 cm when measured into left cusp.  12. Elevated left hemidiaphragm.   FH: Prostate CA, HTN  SH: Lives in [place], owns a window washing  business, married, nonsmoker.   ROS: All systems reviewed and negative except as per HPI.    Current Outpatient Prescriptions  Medication Sig Dispense Refill  . aspirin 81 MG chewable tablet Chew 81 mg by mouth daily.      . finasteride (PROSCAR) 5 MG tablet Take 1 tablet (5 mg total) by mouth daily. 90 tablet 3  . fluticasone (FLONASE) 50 MCG/ACT nasal spray Place 2 sprays into both nostrils daily. 16 g 6  . metoprolol succinate (TOPROL-XL) 100 MG 24 hr tablet Take one tablet by mouth at HS 90 tablet 3  . simvastatin (ZOCOR) 20 MG tablet TAKE 1 TABLET(20 MG) BY MOUTH DAILY 90 tablet 3  . telmisartan (MICARDIS) 80 MG tablet Take 1 tablet (80 mg total) by mouth daily. 90 tablet 3  . VIAGRA 100 MG tablet      No current facility-administered medications for this encounter.     BP 136/90   Pulse 73   Wt 241 lb 8 oz (109.5 kg)   SpO2 99%   BMI 31.01 kg/m  General: NAD, overweight Neck: No JVD, no thyromegaly or thyroid nodule.  Lungs: Clear to auscultation bilaterally with normal respiratory effort. CV: Nondisplaced PMI.  Heart regular S1/S2, widely split S2, no S3/S4, 2/6 early SEM radiating to the neck.  No peripheral edema.  Normal pedal pulses.  Abdomen: Soft, nontender, no hepatosplenomegaly, no distention.  Neurologic: Alert and oriented x 3.  Psych: Normal affect. Extremities: No clubbing or cyanosis.  MSK: Atrophy of left shoulder muscles  Assessment/Plan: 1.  History of Ross Procedure: Mild AI and mildly elevated bioprosthetic pulmonic valve gradient on echo in 8/17.  No exertional dyspnea.  - Repeat echo in 8/18.  - Needs endocarditis prophylaxis with dental work.   2.  Aortic root aneurym: This not uncommonly accompanies Ross procedure.  The pulmonary autograft at sinuses of valsalva measured 5.4 cm by MRA in 2/18.  He is being followed by Dr. Servando Snare.  - MRA chest to follow root dilation in 2/19.  - Continue blood pressure control.  - No surgery at this point.  If he  develops further root dilation or worsening AI, would consider re-do aortic root replacement.   3. HTN: Given aneurysm, need to make sure that BP is well-controlled. BP in 120s/80s when he checks at home.  No changes.   Followup in 1 year.   Loralie Champagne 10/05/2016

## 2016-10-05 NOTE — Patient Instructions (Signed)
Your physician has requested that you have an echocardiogram. Echocardiography is a painless test that uses sound waves to create images of your heart. It provides your doctor with information about the size and shape of your heart and how well your heart's chambers and valves are working. This procedure takes approximately one hour. There are no restrictions for this procedure.  IN AUGUST  MRA OF THE CHEST IN FEB 2019  We will contact you in 1 year to schedule your next appointment.

## 2016-10-12 ENCOUNTER — Ambulatory Visit (INDEPENDENT_AMBULATORY_CARE_PROVIDER_SITE_OTHER): Payer: Commercial Managed Care - HMO | Admitting: Physician Assistant

## 2016-10-12 ENCOUNTER — Encounter: Payer: Self-pay | Admitting: Physician Assistant

## 2016-10-12 VITALS — BP 110/70 | HR 76 | Temp 98.3°F | Resp 14 | Ht 75.0 in | Wt 236.0 lb

## 2016-10-12 DIAGNOSIS — J069 Acute upper respiratory infection, unspecified: Secondary | ICD-10-CM

## 2016-10-12 DIAGNOSIS — B9789 Other viral agents as the cause of diseases classified elsewhere: Secondary | ICD-10-CM | POA: Diagnosis not present

## 2016-10-12 MED ORDER — BENZONATATE 100 MG PO CAPS: 100.0000 mg | ORAL_CAPSULE | Freq: Two times a day (BID) | ORAL | 0 refills | Status: DC | PRN

## 2016-10-12 NOTE — Progress Notes (Signed)
Pre visit review using our clinic review tool, if applicable. No additional management support is needed unless otherwise documented below in the visit note. 

## 2016-10-12 NOTE — Progress Notes (Signed)
Patient presents to clinic today c/o 4 days of URI symptoms. Started out last Thursday with a dry cough gradually worsening up until Friday. Noted chest tightness without chest pain. Denies sinus pressure, sinus pain, ear pain, tooth pain, nasal congestion. Denies fever. Slight chill yesterday evening that has since resolved.  Has been taking Mucinex with some relief. Denies recent travel. Wife sick with similar symptoms starting before. Notes she is all better now. Patient notes he is feeling better today compared to previous days.  Past Medical History:  Diagnosis Date  . Acute gastritis   . Aortic insufficiency    s/p aortic valve replacement in 1994  . Early satiety   . Esophageal stricture   . GERD (gastroesophageal reflux disease)   . Hiatal hernia   . HTN (hypertension)   . Hyperlipidemia   . Neuropathy ([place])    left brachial plexus  . Pain, dental     Current Outpatient Prescriptions on File Prior to Visit  Medication Sig Dispense Refill  . aspirin 81 MG chewable tablet Chew 81 mg by mouth daily.      . finasteride (PROSCAR) 5 MG tablet Take 1 tablet (5 mg total) by mouth daily. 90 tablet 3  . fluticasone (FLONASE) 50 MCG/ACT nasal spray Place 2 sprays into both nostrils daily. 16 g 6  . metoprolol succinate (TOPROL-XL) 100 MG 24 hr tablet Take one tablet by mouth at HS 90 tablet 3  . simvastatin (ZOCOR) 20 MG tablet TAKE 1 TABLET(20 MG) BY MOUTH DAILY 90 tablet 3  . telmisartan (MICARDIS) 80 MG tablet Take 1 tablet (80 mg total) by mouth daily. 90 tablet 3  . VIAGRA 100 MG tablet      No current facility-administered medications on file prior to visit.     Allergies  Allergen Reactions  . Penicillins Hives and Swelling  . Quinidine Other (See Comments)    Increase heart beat per patient    Family History  Problem Relation Age of Onset  . Hypertension    . Prostate cancer    . Leukemia Brother   . Colon cancer Neg Hx   . Pancreatic cancer Neg Hx   . Rectal  cancer Neg Hx   . Stomach cancer Neg Hx     Social History   Social History  . Marital status: Married    Spouse name: N/A  . Number of children: 3  . Years of education: N/A   Social History Main Topics  . Smoking status: Never Smoker  . Smokeless tobacco: Never Used  . Alcohol use No  . Drug use: No  . Sexual activity: Not on file   Other Topics Concern  . Not on file   Social History Narrative  . No narrative on file    Review of Systems - See HPI.  All other ROS are negative.  BP 110/70   Pulse 76   Temp 98.3 F (36.8 C) (Oral)   Resp 14   Ht _0  (1.905 m)   Wt 236 lb (107 kg)   SpO2 95%   BMI 29.50 kg/m   Physical Exam  Constitutional: He is oriented to person, place, and time and well-developed, well-nourished, and in no distress. No distress.  HENT:  Right Ear: External ear normal. Tympanic membrane is not erythematous. No middle ear effusion.  Left Ear: External ear normal. Tympanic membrane is not erythematous.  No middle ear effusion.  Nose: Mucosal edema present. Right sinus exhibits no maxillary sinus  tenderness and no frontal sinus tenderness. Left sinus exhibits no maxillary sinus tenderness and no frontal sinus tenderness.  Mouth/Throat: Oropharynx is clear and moist. No oropharyngeal exudate, posterior oropharyngeal edema or posterior oropharyngeal erythema.  Neck: Neck supple.  Cardiovascular: Normal rate, regular rhythm and normal heart sounds.  Exam reveals no gallop and no friction rub.   No murmur heard. Pulmonary/Chest: Effort normal and breath sounds normal. No respiratory distress. He has no wheezes. He has no rales.  Lymphadenopathy:    He has no cervical adenopathy.  Neurological: He is alert and oriented to person, place, and time.  Skin: Skin is warm and dry. He is not diaphoretic.  Psychiatric: Affect normal.   Recent Results (from the past 2160 hour(s))  Creatinine, serum     Status: None   Collection Time: 09/04/16  1:31 PM    Result Value Ref Range   Creatinine, Ser 1.00 0.61 - 1.24 mg/dL   GFR calc non Af Amer >60 >60 mL/min   GFR calc Af Amer >60 >60 mL/min    Comment: (NOTE) The eGFR has been calculated using the CKD EPI equation. This calculation has not been validated in all clinical situations. eGFR's persistently <60 mL/min signify possible Chronic Kidney Disease.    Assessment/Plan: 1. Viral URI with cough Improving symptoms. Exam unremarkable except for mild nasal mucosal swelling. No TTP sinuses. Start supportive measures. OTC medications reviewed. RX Tessalon for cough. FU if not continuing to improve.   Leeanne Rio, PA-C

## 2016-10-12 NOTE — Patient Instructions (Signed)
We are sorry that you are not feeling well.  Here is how we plan to help!  Based on what you have shared with me it looks like you have upper respiratory tract inflammation that has resulted in a significant cough.  Inflammation and infection in the upper respiratory tract is commonly called bronchitis and has four common causes:  Allergies, Viral Infections, Acid Reflux and Bacterial Infections.  Allergies, viruses and acid reflux are treated by controlling symptoms or eliminating the cause. An example might be a cough caused by taking certain blood pressure medications. You stop the cough by changing the medication. Another example might be a cough caused by acid reflux. Controlling the reflux helps control the cough.  Based on your presentation I believe you most likely have A cough due to a virus.  This is called viral bronchitis and is best treated by rest, plenty of fluids and control of the cough.  May continue a plain Mucinex or a Coricidin HBP.   In addition you may use A prescription cough medication called Tessalon Perles 100mg . You may take 1-2 capsules every 8 hours as needed for your cough.   USE OF BRONCHODILATOR ("RESCUE") INHALERS: There is a risk from using your bronchodilator too frequently.  The risk is that over-reliance on a medication which only relaxes the muscles surrounding the breathing tubes can reduce the effectiveness of medications prescribed to reduce swelling and congestion of the tubes themselves.  Although you feel brief relief from the bronchodilator inhaler, your asthma may actually be worsening with the tubes becoming more swollen and filled with mucus.  This can delay other crucial treatments, such as oral steroid medications. If you need to use a bronchodilator inhaler daily, several times per day, you should discuss this with your provider.  There are probably better treatments that could be used to keep your asthma under control.     HOME CARE . Only take  medications as instructed by your medical team. . Complete the entire course of an antibiotic. . Drink plenty of fluids and get plenty of rest. . Avoid close contacts especially the very young and the elderly . Cover your mouth if you cough or cough into your sleeve. . Always remember to wash your hands . A steam or ultrasonic humidifier can help congestion.   GET HELP RIGHT AWAY IF: . You develop worsening fever. . You become short of breath . You cough up blood. . Your symptoms persist after you have completed your treatment plan MAKE SURE YOU   Understand these instructions.  Will watch your condition.  Will get help right away if you are not doing well or get worse.

## 2016-10-15 DIAGNOSIS — K219 Gastro-esophageal reflux disease without esophagitis: Secondary | ICD-10-CM | POA: Diagnosis not present

## 2016-10-15 DIAGNOSIS — J0140 Acute pansinusitis, unspecified: Secondary | ICD-10-CM | POA: Diagnosis not present

## 2016-12-07 ENCOUNTER — Ambulatory Visit: Payer: Commercial Managed Care - HMO | Admitting: Family Medicine

## 2016-12-29 ENCOUNTER — Ambulatory Visit (INDEPENDENT_AMBULATORY_CARE_PROVIDER_SITE_OTHER): Payer: Commercial Managed Care - HMO | Admitting: Family Medicine

## 2016-12-29 ENCOUNTER — Encounter: Payer: Self-pay | Admitting: Family Medicine

## 2016-12-29 VITALS — BP 112/75 | HR 57 | Temp 98.1°F | Resp 16 | Ht 75.0 in | Wt 234.0 lb

## 2016-12-29 DIAGNOSIS — E785 Hyperlipidemia, unspecified: Secondary | ICD-10-CM | POA: Diagnosis not present

## 2016-12-29 DIAGNOSIS — I10 Essential (primary) hypertension: Secondary | ICD-10-CM

## 2016-12-29 LAB — CBC WITH DIFFERENTIAL/PLATELET
BASOS PCT: 0.2 % (ref 0.0–3.0)
Basophils Absolute: 0 10*3/uL (ref 0.0–0.1)
EOS PCT: 1.7 % (ref 0.0–5.0)
Eosinophils Absolute: 0.1 10*3/uL (ref 0.0–0.7)
HCT: 44.5 % (ref 39.0–52.0)
Hemoglobin: 15 g/dL (ref 13.0–17.0)
LYMPHS ABS: 1.8 10*3/uL (ref 0.7–4.0)
Lymphocytes Relative: 27.4 % (ref 12.0–46.0)
MCHC: 33.7 g/dL (ref 30.0–36.0)
MCV: 89.2 fl (ref 78.0–100.0)
MONOS PCT: 10.1 % (ref 3.0–12.0)
Monocytes Absolute: 0.7 10*3/uL (ref 0.1–1.0)
NEUTROS ABS: 4 10*3/uL (ref 1.4–7.7)
NEUTROS PCT: 60.6 % (ref 43.0–77.0)
PLATELETS: 280 10*3/uL (ref 150.0–400.0)
RBC: 4.99 Mil/uL (ref 4.22–5.81)
RDW: 13.6 % (ref 11.5–15.5)
WBC: 6.6 10*3/uL (ref 4.0–10.5)

## 2016-12-29 LAB — BASIC METABOLIC PANEL
BUN: 12 mg/dL (ref 6–23)
CO2: 27 mEq/L (ref 19–32)
Calcium: 9.5 mg/dL (ref 8.4–10.5)
Chloride: 106 mEq/L (ref 96–112)
Creatinine, Ser: 0.88 mg/dL (ref 0.40–1.50)
GFR: 92.53 mL/min (ref >60.00)
Glucose, Bld: 107 mg/dL — ABNORMAL HIGH (ref 70–99)
POTASSIUM: 4.7 meq/L (ref 3.5–5.1)
SODIUM: 139 meq/L (ref 135–145)

## 2016-12-29 LAB — LIPID PANEL
CHOL/HDL RATIO: 4
CHOLESTEROL: 124 mg/dL (ref 0–200)
HDL: 34.9 mg/dL — ABNORMAL LOW (ref >39.00)
LDL CALC: 65 mg/dL (ref 0–99)
NonHDL: 88.89
Triglycerides: 117 mg/dL (ref 0.0–149.0)
VLDL: 23.4 mg/dL (ref 0.0–40.0)

## 2016-12-29 LAB — HEPATIC FUNCTION PANEL
ALK PHOS: 54 U/L (ref 39–117)
ALT: 21 U/L (ref 0–53)
AST: 16 U/L (ref 0–37)
Albumin: 4.4 g/dL (ref 3.5–5.2)
BILIRUBIN DIRECT: 0.2 mg/dL (ref 0.0–0.3)
Total Bilirubin: 0.8 mg/dL (ref 0.2–1.2)
Total Protein: 6.7 g/dL (ref 6.0–8.3)

## 2016-12-29 LAB — TSH: TSH: 2.19 u[IU]/mL (ref 0.35–4.50)

## 2016-12-29 NOTE — Progress Notes (Signed)
Pre visit review using our clinic review tool, if applicable. No additional management support is needed unless otherwise documented below in the visit note. 

## 2016-12-29 NOTE — Assessment & Plan Note (Signed)
Chronic problem.  Well controlled today.  Asymptomatic.  Check labs.  No anticipated med changes.  Will follow. 

## 2016-12-29 NOTE — Progress Notes (Signed)
   Subjective:    Patient ID: KINNICK MAUS, male    DOB: March 08, 1951, 66 y.o.   MRN: [id number]  HPI HTN- chronic problem, on Telmisartan 80mg  daily and Metoprolol 100mg  daily w/ good control.  Denies CP, SOB, HAs, visual changes, edema.  Hyperlipidemia- chronic problem, on Simvastatin daily.  Hx of good control- last LDL 74.  Walking regularly, occasionally golfing.  No abd pain, N/V.   Review of Systems For ROS see HPI     Objective:   Physical Exam  Constitutional: He is oriented to person, place, and time. He appears well-developed and well-nourished. No distress.  HENT:  Head: Normocephalic and atraumatic.  Eyes: Conjunctivae and EOM are normal. Pupils are equal, round, and reactive to light.  Neck: Normal range of motion. Neck supple. No thyromegaly present.  Cardiovascular: Normal rate, regular rhythm and intact distal pulses.   Murmur (mechanical valve sounds) heard. Pulmonary/Chest: Effort normal and breath sounds normal. No respiratory distress.  Abdominal: Soft. Bowel sounds are normal. He exhibits no distension.  Musculoskeletal: He exhibits no edema.  Lymphadenopathy:    He has no cervical adenopathy.  Neurological: He is alert and oriented to person, place, and time. No cranial nerve deficit.  Skin: Skin is warm and dry.  Psychiatric: He has a normal mood and affect. His behavior is normal.  Vitals reviewed.         Assessment & Plan:

## 2016-12-29 NOTE — Patient Instructions (Signed)
Schedule your complete physical in 6 months and your Medicare Wellness Visit at the same time [facility] notify you of your lab results and make any changes if needed Keep up the good work!  You look great!!! Call with any questions or concerns Have a great time at the beach!!!

## 2016-12-29 NOTE — Assessment & Plan Note (Signed)
Chronic problem.  Tolerating statin w/o difficulty.  Applauded his weight loss.  Check labs.  Adjust meds prn

## 2017-01-01 ENCOUNTER — Encounter: Payer: Self-pay | Admitting: Physician Assistant

## 2017-01-01 ENCOUNTER — Ambulatory Visit (INDEPENDENT_AMBULATORY_CARE_PROVIDER_SITE_OTHER): Payer: Commercial Managed Care - HMO | Admitting: Physician Assistant

## 2017-01-01 VITALS — BP 118/76 | HR 64 | Temp 98.0°F | Resp 14 | Ht 75.0 in | Wt 234.0 lb

## 2017-01-01 DIAGNOSIS — S70362A Insect bite (nonvenomous), left thigh, initial encounter: Secondary | ICD-10-CM | POA: Diagnosis not present

## 2017-01-01 DIAGNOSIS — W57XXXA Bitten or stung by nonvenomous insect and other nonvenomous arthropods, initial encounter: Secondary | ICD-10-CM | POA: Diagnosis not present

## 2017-01-01 MED ORDER — DOXYCYCLINE HYCLATE 100 MG PO CAPS: 100.0000 mg | ORAL_CAPSULE | Freq: Two times a day (BID) | ORAL | 0 refills | Status: DC

## 2017-01-01 NOTE — Progress Notes (Signed)
Pre visit review using our clinic review tool, if applicable. No additional management support is needed unless otherwise documented below in the visit note. 

## 2017-01-01 NOTE — Progress Notes (Signed)
Patient presents to clinic today c/o insect bite to upper left thigh first noted this morning.  States he noted while in the shower. Noted mild itching. Notices red bump in mirror and on palpation. Denies remembering a bite. Denies pain. Denies fever, chills, malaise/fatigue.   Past Medical History:  Diagnosis Date  . Acute gastritis   . Aortic insufficiency    s/p aortic valve replacement in 1994  . Early satiety   . Esophageal stricture   . GERD (gastroesophageal reflux disease)   . Hiatal hernia   . HTN (hypertension)   . Hyperlipidemia   . Neuropathy    left brachial plexus  . Pain, dental     Current Outpatient Prescriptions on File Prior to Visit  Medication Sig Dispense Refill  . aspirin 81 MG chewable tablet Chew 81 mg by mouth daily.      . finasteride (PROSCAR) 5 MG tablet Take 1 tablet (5 mg total) by mouth daily. 90 tablet 3  . fluticasone (FLONASE) 50 MCG/ACT nasal spray Place 2 sprays into both nostrils daily. 16 g 6  . metoprolol succinate (TOPROL-XL) 100 MG 24 hr tablet Take one tablet by mouth at HS 90 tablet 3  . simvastatin (ZOCOR) 20 MG tablet TAKE 1 TABLET(20 MG) BY MOUTH DAILY 90 tablet 3  . telmisartan (MICARDIS) 80 MG tablet Take 1 tablet (80 mg total) by mouth daily. 90 tablet 3  . VIAGRA 100 MG tablet      No current facility-administered medications on file prior to visit.     Allergies  Allergen Reactions  . Penicillins Hives and Swelling  . Quinidine Other (See Comments)    Increase heart beat per patient    Family History  Problem Relation Age of Onset  . Hypertension Unknown   . Prostate cancer Unknown   . Leukemia Brother   . Colon cancer Neg Hx   . Pancreatic cancer Neg Hx   . Rectal cancer Neg Hx   . Stomach cancer Neg Hx     Social History   Social History  . Marital status: Married    Spouse name: N/A  . Number of children: 3  . Years of education: N/A   Social History Main Topics  . Smoking status: Never Smoker  .  Smokeless tobacco: Never Used  . Alcohol use No  . Drug use: No  . Sexual activity: Not Asked   Other Topics Concern  . None   Social History Narrative  . None    Review of Systems - See HPI.  All other ROS are negative.  BP 118/76   Pulse 64   Temp 98 F (36.7 C) (Oral)   Resp 14   Ht 6\' 3"  (1.905 m)   Wt 234 lb (106.1 kg)   SpO2 98%   BMI 29.25 kg/m   Physical Exam  Constitutional: He is oriented to person, place, and time and well-developed, well-nourished, and in no distress.  HENT:  Head: Normocephalic and atraumatic.  Eyes: Conjunctivae are normal.  Cardiovascular: Normal rate, regular rhythm, normal heart sounds and intact distal pulses.   Pulmonary/Chest: Effort normal and breath sounds normal. No respiratory distress. He has no wheezes. He has no rales. He exhibits no tenderness.  Neurological: He is alert and oriented to person, place, and time.  Skin:     Vitals reviewed.   Recent Results (from the past 2160 hour(s))  Lipid panel     Status: Abnormal   Collection Time: 12/29/16  9:15 AM  Result Value Ref Range   Cholesterol 124 0 - 200 mg/dL    Comment: ATP III Classification       Desirable:  < 200 mg/dL               Borderline High:  200 - 239 mg/dL          High:  > = 240 mg/dL   Triglycerides 117.0 0.0 - 149.0 mg/dL    Comment: Normal:  <150 mg/dLBorderline High:  150 - 199 mg/dL   HDL 34.90 (L) >39.00 mg/dL   VLDL 23.4 0.0 - 40.0 mg/dL   LDL Cholesterol 65 0 - 99 mg/dL   Total CHOL/HDL Ratio 4     Comment:                Men          Women1/2 Average Risk     3.4          3.3Average Risk          5.0          4.42X Average Risk          9.6          7.13X Average Risk          15.0          11.0                       NonHDL 88.89     Comment: NOTE:  Non-HDL goal should be 30 mg/dL higher than patient's LDL goal (i.e. LDL goal of < 70 mg/dL, would have non-HDL goal of < 100 mg/dL)  Basic metabolic panel     Status: Abnormal   Collection Time:  12/29/16  9:15 AM  Result Value Ref Range   Sodium 139 135 - 145 mEq/L   Potassium 4.7 3.5 - 5.1 mEq/L   Chloride 106 96 - 112 mEq/L   CO2 27 19 - 32 mEq/L   Glucose, Bld 107 (H) 70 - 99 mg/dL   BUN 12 6 - 23 mg/dL   Creatinine, Ser 0.88 0.40 - 1.50 mg/dL   Calcium 9.5 8.4 - 10.5 mg/dL   GFR 92.53 >60.00 mL/min  TSH     Status: None   Collection Time: 12/29/16  9:15 AM  Result Value Ref Range   TSH 2.19 0.35 - 4.50 uIU/mL  Hepatic function panel     Status: None   Collection Time: 12/29/16  9:15 AM  Result Value Ref Range   Total Bilirubin 0.8 0.2 - 1.2 mg/dL   Bilirubin, Direct 0.2 0.0 - 0.3 mg/dL   Alkaline Phosphatase 54 39 - 117 U/L   AST 16 0 - 37 U/L   ALT 21 0 - 53 U/L   Total Protein 6.7 6.0 - 8.3 g/dL   Albumin 4.4 3.5 - 5.2 g/dL  CBC with Differential/Platelet     Status: None   Collection Time: 12/29/16  9:15 AM  Result Value Ref Range   WBC 6.6 4.0 - 10.5 K/uL   RBC 4.99 4.22 - 5.81 Mil/uL   Hemoglobin 15.0 13.0 - 17.0 g/dL   HCT 44.5 39.0 - 52.0 %   MCV 89.2 78.0 - 100.0 fl   MCHC 33.7 30.0 - 36.0 g/dL   RDW 13.6 11.5 - 15.5 %   Platelets 280.0 150.0 - 400.0 K/uL   Neutrophils Relative % 60.6 43.0 - 77.0 %   Lymphocytes  Relative 27.4 12.0 - 46.0 %   Monocytes Relative 10.1 3.0 - 12.0 %   Eosinophils Relative 1.7 0.0 - 5.0 %   Basophils Relative 0.2 0.0 - 3.0 %   Neutro Abs 4.0 1.4 - 7.7 K/uL   Lymphs Abs 1.8 0.7 - 4.0 K/uL   Monocytes Absolute 0.7 0.1 - 1.0 K/uL   Eosinophils Absolute 0.1 0.0 - 0.7 K/uL   Basophils Absolute 0.0 0.0 - 0.1 K/uL    Assessment/Plan: 1. Insect bite, initial encounter Induration noted at site with tenderness and erythema. Seems to be mild underlying necrosis. No residual stinger/tick/etc on examination. Unclear etiology. Potential spider bite. Will start Doxycycline. Supportive measures and OTC medications reviewed. Discussed ER precautions with patient. He is going out of town today. Discussed ER or UC if there is any fever  or worsening/spreading erythema or induration while on ABX. - doxycycline (VIBRAMYCIN) 100 MG capsule; Take 1 capsule (100 mg total) by mouth 2 (two) times daily.  Dispense: 14 capsule; Refill: 0   Leeanne Rio, [place]

## 2017-01-01 NOTE — Patient Instructions (Signed)
Please take antibiotic as directed. Keep skin clean and dry. If you note any worsening redness, hardness or new-onset tenderness, please call or return to office.

## 2017-02-12 ENCOUNTER — Ambulatory Visit (INDEPENDENT_AMBULATORY_CARE_PROVIDER_SITE_OTHER): Payer: 59

## 2017-02-12 ENCOUNTER — Encounter: Payer: Self-pay | Admitting: Physician Assistant

## 2017-02-12 ENCOUNTER — Ambulatory Visit (INDEPENDENT_AMBULATORY_CARE_PROVIDER_SITE_OTHER): Payer: 59 | Admitting: Physician Assistant

## 2017-02-12 VITALS — BP 128/74 | HR 70 | Temp 98.1°F | Ht 75.0 in | Wt 236.6 lb

## 2017-02-12 DIAGNOSIS — R0602 Shortness of breath: Secondary | ICD-10-CM

## 2017-02-12 DIAGNOSIS — I10 Essential (primary) hypertension: Secondary | ICD-10-CM

## 2017-02-12 DIAGNOSIS — R42 Dizziness and giddiness: Secondary | ICD-10-CM | POA: Diagnosis not present

## 2017-02-12 NOTE — Patient Instructions (Addendum)
It was great to meet you!  If you develop any chest pain, worsening lightheadedness, dizziness, or any stroke-like symptoms (severe headache, slurred speech, vision changes, unusual weakness on one side of the body) -- GO TO THE EMERGENCY ROOM IMMEDIATELY.  Work on hydration.  If your blood pressure remains elevated after taking your medication, please call your cardiologist office.

## 2017-02-12 NOTE — Progress Notes (Addendum)
[name] [name] is a 66 y.o. male here for a new problem.  Water quality scientist, [name] [name], acting as Education administrator for [employer], [place].  History of Present Illness:   Chief Complaint  Patient presents with  . Dizziness   HPI   [name] [name] has a PMH of aortic insufficiency, essential HTN, thoracic aortic aneurysm, and aortic insufficiency who presents today for evaluation of a brief episode of SOB and intermittent lightheadedness. When he woke up this morning had some SOB which went away after 30 minutes. Decided to check BP at this time, prior to taking his BP medications this morning and noted that his BP was 147/80 which was quite elevated for him. He took his medications and then around 10 am re-checked his BP and it had normalized to his usual BP around 118/80.  Denies missing any medications. He does note that he has never checked his BP in the morning prior to his BP medications.   He has also had some intermittent episodes of light-headedness. These occur while he is sitting. He denies any vertigo or sensation of the room spinning.  Denies drug use, excessive caffeine intake, changes in diet, recent travel, recent URI, recent immobility, hx of blood clots, chest pain, changes in vision/hearing, current calf tenderness. He does not take any recreational drugs. He does have a history of "sensory seizures" that his cardiologist suspects is 2/2 his L occipital cavernoma with chronic hemorrhage. He does endorse that he owns a window washing company and has probably not been drinking much fluids lately.  He is followed by Dr. Servando Snare for his aortic root aneurysm and Dr. Aundra Dubin for his HTN and h/o of Ross procedure. He undergoes yearly MRA of chest which most recently in Feb 2018 shows stable dilatation.   Past Medical History:  Diagnosis Date  . Acute gastritis   . Aortic insufficiency    s/p aortic valve replacement in 1994  . Early satiety   . Esophageal stricture   . GERD (gastroesophageal reflux  disease)   . Hiatal hernia   . Hyperlipidemia   . Neuropathy    left brachial plexus  . Pain, dental      Social History   Social History  . Marital status: Married    Spouse name: N/A  . Number of children: 3  . Years of education: N/A   Occupational History  . Not on file.   Social History Main Topics  . Smoking status: Never Smoker  . Smokeless tobacco: Never Used  . Alcohol use No  . Drug use: No  . Sexual activity: Not on file   Other Topics Concern  . Not on file   Social History Narrative  . No narrative on file    Past Surgical History:  Procedure Laterality Date  . AORTIC VALVE REPLACEMENT  1994  . GANGLION CYST EXCISION  JAN 2002  . HIATAL HERNIA REPAIR  DEC 2002  . TONSILLECTOMY    . VASECTOMY  DEC 2002    Family History  Problem Relation Age of Onset  . Hypertension Unknown   . Prostate cancer Unknown   . Leukemia Brother   . Colon cancer Neg Hx   . Pancreatic cancer Neg Hx   . Rectal cancer Neg Hx   . Stomach cancer Neg Hx     Allergies  Allergen Reactions  . Penicillins Hives and Swelling  . Quinidine Other (See Comments)    Increase heart beat per patient    Current Medications:  Current Outpatient Prescriptions:  .  aspirin 81 MG chewable tablet, Chew 81 mg by mouth daily.  , Disp: , Rfl:  .  finasteride (PROSCAR) 5 MG tablet, Take 1 tablet (5 mg total) by mouth daily., Disp: 90 tablet, Rfl: 3 .  fluticasone (FLONASE) 50 MCG/ACT nasal spray, Place 2 sprays into both nostrils daily., Disp: 16 g, Rfl: 6 .  metoprolol succinate (TOPROL-XL) 100 MG 24 hr tablet, Take one tablet by mouth at HS, Disp: 90 tablet, Rfl: 3 .  simvastatin (ZOCOR) 20 MG tablet, TAKE 1 TABLET(20 MG) BY MOUTH DAILY, Disp: 90 tablet, Rfl: 3 .  telmisartan (MICARDIS) 80 MG tablet, Take 1 tablet (80 mg total) by mouth daily., Disp: 90 tablet, Rfl: 3 .  VIAGRA 100 MG tablet, , Disp: , Rfl:    Review of Systems:   Review of Systems  Constitutional: Negative for  chills, fever, malaise/fatigue and weight loss.  Respiratory: Positive for shortness of breath.   Cardiovascular: Negative for chest pain, palpitations and leg swelling.  Neurological: Negative for dizziness and headaches.  Psychiatric/Behavioral: The patient is not nervous/anxious.       Vitals:   Vitals:   02/12/17 1453 02/12/17 1808  BP: 124/74 128/74  Pulse: 70   Temp: 98.1 F (36.7 C)   TempSrc: Oral   SpO2: 95%   Weight: 236 lb 9.6 oz (107.3 kg)   Height: 6\' 3"  (1.905 m)      Body mass index is 29.57 kg/m.  Orthostatic VS for the past 24 hrs:  BP- Lying BP- Sitting BP- Standing at 0 minutes  02/12/17 1543 128/74 120/70 124/72       Physical Exam:   Physical Exam  Constitutional: He appears well-developed. He is cooperative.  Non-toxic appearance. He does not have a sickly appearance. He does not appear ill. No distress.  Cardiovascular: Normal rate, regular rhythm and normal pulses.   Murmur (mechanical valve sounds) heard. No LE edema  Pulmonary/Chest: Effort normal and breath sounds normal.  Musculoskeletal:  L arm atrophy with limited passive movement  Neurological: He is alert. No cranial nerve deficit or sensory deficit. GCS eye subscore is 4. GCS verbal subscore is 5. GCS motor subscore is 6.  Reduced grip strength in L hand 2/2 chronic L brachial plexus neuropathy  Skin: Skin is warm, dry and intact.  Psychiatric: He has a normal mood and affect. His speech is normal and behavior is normal.  Nursing note and vitals reviewed.  EKG tracing is personally reviewed.  EKG notes NSR.  No acute changes; compared with prior EKG.   CLINICAL DATA:  Shortness of breath this morning.  EXAM: CHEST  2 VIEW  COMPARISON:  None.  FINDINGS: The heart is borderline enlarged. There is tortuosity and calcification of the thoracic aorta. Surgical changes noted with median sternotomy wires. The lungs are clear. Mild elevation of the left hemidiaphragm. No pleural  effusion. The bony thorax is intact.  IMPRESSION: Mild cardiac enlargement but no acute pulmonary findings.  Assessment and Plan:    [name] [name] was seen today for dizziness.  Diagnoses and all orders for this visit:  Essential hypertension Blood pressure is currently well controlled. Continue current medication regimen. I discussed with him that given his history, I have a very LOW threshold for him to go to the ER over the weekend if his blood pressures become elevated or he has any cardiac/neurologic concerns.  Episodic Lightheadedness Orthostatics negative. EKG tracing is personally reviewed.  EKG notes NSR.  No  acute changes, consistent RBBB. Lightheadedness resolved. Neuro exam appears to be at his baseline. We reviewed stroke precautions and I advised him to go to the ER if he has any changes or concerns. Work on fluid intake. -     EKG 12-Lead  SOB (shortness of breath) Chest xray with mild cardiac enlargement, per records this was also on 2002 and 2003 chest xrays. If SOB persist, patient should go to the emergency room. -     DG Chest 2 View; Future   . Reviewed expectations re: course of current medical issues. . Discussed self-management of symptoms. . Outlined signs and symptoms indicating need for more acute intervention. . Patient verbalized understanding and all questions were answered. . See orders for this visit as documented in the electronic medical record. . Patient received an After-Visit Summary.  CMA or LPN served as scribe during this visit. History, Physical, and Plan performed by medical provider. Documentation and orders reviewed and attested to.  Inda Coke, PA-C

## 2017-02-19 ENCOUNTER — Other Ambulatory Visit: Payer: Self-pay | Admitting: Physician Assistant

## 2017-02-19 NOTE — Telephone Encounter (Signed)
This is a CHF pt 

## 2017-02-23 ENCOUNTER — Other Ambulatory Visit: Payer: Self-pay | Admitting: Physician Assistant

## 2017-03-17 ENCOUNTER — Ambulatory Visit (HOSPITAL_COMMUNITY): Payer: 59 | Attending: Cardiovascular Disease

## 2017-03-17 ENCOUNTER — Other Ambulatory Visit: Payer: Self-pay

## 2017-03-17 DIAGNOSIS — I5030 Unspecified diastolic (congestive) heart failure: Secondary | ICD-10-CM | POA: Insufficient documentation

## 2017-03-17 DIAGNOSIS — Z954 Presence of other heart-valve replacement: Secondary | ICD-10-CM | POA: Diagnosis not present

## 2017-03-17 DIAGNOSIS — I420 Dilated cardiomyopathy: Secondary | ICD-10-CM | POA: Diagnosis not present

## 2017-03-17 DIAGNOSIS — I0989 Other specified rheumatic heart diseases: Secondary | ICD-10-CM | POA: Diagnosis not present

## 2017-03-17 DIAGNOSIS — I059 Rheumatic mitral valve disease, unspecified: Secondary | ICD-10-CM | POA: Diagnosis not present

## 2017-03-24 ENCOUNTER — Other Ambulatory Visit: Payer: Self-pay | Admitting: General Practice

## 2017-03-24 MED ORDER — FINASTERIDE 5 MG PO TABS: 5.0000 mg | ORAL_TABLET | Freq: Every day | ORAL | 1 refills | Status: DC

## 2017-03-25 ENCOUNTER — Other Ambulatory Visit: Payer: Self-pay | Admitting: Cardiology

## 2017-04-13 ENCOUNTER — Other Ambulatory Visit: Payer: Self-pay | Admitting: Physician Assistant

## 2017-04-14 ENCOUNTER — Other Ambulatory Visit: Payer: Self-pay | Admitting: Physician Assistant

## 2017-04-14 NOTE — Telephone Encounter (Signed)
Medication Detail    Disp Refills Start End   simvastatin (ZOCOR) 20 MG tablet 30 tablet 0 04/14/2017    Sig: TAKE 1 TABLET BY MOUTH DAILY   Sent to pharmacy as: simvastatin (ZOCOR) 20 MG tablet   Notes to Pharmacy: Patient needs to call and schedule appt for future refills. Thank you for your time.   E-Prescribing Status: Receipt confirmed by pharmacy (04/14/2017 2:15 PM EDT)   Pharmacy   [id number] - [place], [address] [place]

## 2017-04-15 DIAGNOSIS — H9202 Otalgia, left ear: Secondary | ICD-10-CM | POA: Diagnosis not present

## 2017-04-15 DIAGNOSIS — M26609 Unspecified temporomandibular joint disorder, unspecified side: Secondary | ICD-10-CM | POA: Diagnosis not present

## 2017-04-29 ENCOUNTER — Encounter: Payer: Self-pay | Admitting: Family Medicine

## 2017-04-29 ENCOUNTER — Telehealth: Payer: Self-pay

## 2017-04-29 ENCOUNTER — Ambulatory Visit (HOSPITAL_BASED_OUTPATIENT_CLINIC_OR_DEPARTMENT_OTHER)
Admission: RE | Admit: 2017-04-29 | Discharge: 2017-04-29 | Disposition: A | Discharge status: Home / Self Care | Payer: 59 | Source: Ambulatory Visit | Attending: Family Medicine | Admitting: Family Medicine

## 2017-04-29 ENCOUNTER — Ambulatory Visit (INDEPENDENT_AMBULATORY_CARE_PROVIDER_SITE_OTHER): Payer: 59 | Admitting: Family Medicine

## 2017-04-29 VITALS — BP 130/78 | HR 75 | Temp 98.1°F | Resp 17 | Ht 75.0 in | Wt 238.0 lb

## 2017-04-29 DIAGNOSIS — R51 Headache: Secondary | ICD-10-CM | POA: Insufficient documentation

## 2017-04-29 DIAGNOSIS — H538 Other visual disturbances: Secondary | ICD-10-CM | POA: Diagnosis not present

## 2017-04-29 DIAGNOSIS — G9389 Other specified disorders of brain: Secondary | ICD-10-CM | POA: Diagnosis not present

## 2017-04-29 DIAGNOSIS — G458 Other transient cerebral ischemic attacks and related syndromes: Secondary | ICD-10-CM | POA: Diagnosis not present

## 2017-04-29 DIAGNOSIS — R42 Dizziness and giddiness: Secondary | ICD-10-CM | POA: Diagnosis not present

## 2017-04-29 DIAGNOSIS — R519 Headache, unspecified: Secondary | ICD-10-CM

## 2017-04-29 DIAGNOSIS — G319 Degenerative disease of nervous system, unspecified: Secondary | ICD-10-CM | POA: Insufficient documentation

## 2017-04-29 MED ORDER — CETIRIZINE HCL 10 MG PO TABS: 10.0000 mg | ORAL_TABLET | Freq: Every day | ORAL | 11 refills | Status: DC

## 2017-04-29 NOTE — Progress Notes (Signed)
Pre visit review using our clinic review tool, if applicable. No additional management support is needed unless otherwise documented below in the visit note. 

## 2017-04-29 NOTE — Progress Notes (Signed)
   Subjective:    Patient ID: [name] [name], male    DOB: 10-10-1950, 67 y.o.   MRN: [id number]  HPI Dizziness- pt was outside yesterday looking up and had 'giant vertigo thing where the entire thing flipped upsidedown'.  Dropped to his knees.  Episode lasted for ~5 minutes.  At the time, pt was flushed and heart was racing.  After 5 minutes, sxs resolved and pt 'felt fine'- was able to drive home.  This AM, pt had blurred vision- which resolved spontaneously.  Now w/ 'pounding headache' in both temples.  No relief w/ acetaminophen.  Also w/ L ear and jaw pain.   Review of Systems For ROS see HPI     Objective:   Physical Exam  Constitutional: He is oriented to person, place, and time. He appears well-developed and well-nourished. No distress.  HENT:  Head: Normocephalic and atraumatic.  No TTP over sinuses + turbinate edema + PND TMs normal bilaterally  Eyes: Pupils are equal, round, and reactive to light. Conjunctivae and EOM are normal.  Neck: Normal range of motion. Neck supple.  Cardiovascular: Normal rate and regular rhythm.   Mechanical valve  Pulmonary/Chest: Effort normal and breath sounds normal. No respiratory distress. He has no wheezes.  Lymphadenopathy:    He has no cervical adenopathy.  Neurological: He is alert and oriented to person, place, and time. No cranial nerve deficit. Coordination normal.  Skin: Skin is warm and dry.  Psychiatric: He has a normal mood and affect. His behavior is normal. Thought content normal.  Vitals reviewed.         Assessment & Plan:  Dizziness/HA/blurry vision- pt's sudden onset of vertigo yesterday w/ prolonged neck extension is consistent w/ BPV but given his hx of possible cavernous malformation it is worthwhile to do a head CT to r/o a bleed.  His blurry vision from this AM completely resolved and it occurred in both eyes so this would be atypical for a TIA, but again, a CT is warranted.  Suspect the dizziness was positional,  the blurry vision was allergy related/dry eye, and his HA appears to be allergy/jaw pain/TMJ related today.  Start daily antihistamine.  Increase water intake.  Reviewed supportive care and red flags that should prompt return.  Pt expressed understanding and is in agreement w/ plan.

## 2017-04-29 NOTE — Telephone Encounter (Signed)
Attempted to reach patient to discuss vertigo symptoms, no answer.

## 2017-04-29 NOTE — Patient Instructions (Signed)
Follow up as needed or as scheduled We'll notify you of your CT results Start daily Zyrtec (compare prescription vs OTC prices) Drink plenty of fluids Change positions slowly to allow your body time to adjust Alternate tylenol or ibuprofen for headache Call with any questions or concerns Hang in there!!!

## 2017-05-11 ENCOUNTER — Other Ambulatory Visit: Payer: Self-pay | Admitting: Physician Assistant

## 2017-05-19 ENCOUNTER — Other Ambulatory Visit: Payer: Self-pay | Admitting: Physician Assistant

## 2017-05-20 NOTE — Telephone Encounter (Signed)
This is a CHF pt 

## 2017-05-24 ENCOUNTER — Other Ambulatory Visit: Payer: Self-pay | Admitting: General Practice

## 2017-05-24 MED ORDER — TELMISARTAN 80 MG PO TABS: 80.0000 mg | ORAL_TABLET | Freq: Every day | ORAL | 3 refills | Status: DC

## 2017-06-09 NOTE — Progress Notes (Signed)
Subjective:   [name] [name] is a 66 y.o. male who presents for Medicare Annual/Subsequent preventive examination.  Review of Systems:  No ROS.  Medicare Wellness Visit. Additional risk factors are reflected in the social history.  Cardiac Risk Factors include: advanced age (>15men, >44 women);dyslipidemia;hypertension;male gender;smoking/ tobacco exposure   Sleep patterns: Sleeps 7-8 hours Home Safety/Smoke Alarms: Feels safe in home. Smoke alarms in place.  Living environment; residence and Firearm Safety: Lives with wife in 1 story home.  Seat Belt Safety/Bike Helmet: Wears seat belt.    Male:   CCS-Colonoscopy 05/14/2014, normal. Recall 10 years.  PSA-08/11/2016, 1.550. Followed by [facility].  Lab Results  Component Value Date   PSA 5.12 (H) 06/08/2016   PSA 1.25 06/03/2015   PSA 3.35 05/31/2014       Objective:    Vitals: BP 124/70   Pulse (!) 58   Temp 98.1 F (36.7 C) (Oral)   Resp 16   Ht 6\' 3"  (1.905 m)   Wt 238 lb (108 kg)   SpO2 98%   BMI 29.75 kg/m   Body mass index is 29.75 kg/m.  Tobacco Social History   Tobacco Use  Smoking Status Never Smoker  Smokeless Tobacco Never Used     Counseling given: Yes   Past Medical History:  Diagnosis Date  . Acute gastritis   . Aortic insufficiency    s/p aortic valve replacement in 1994  . Early satiety   . Esophageal stricture   . GERD (gastroesophageal reflux disease)   . Hiatal hernia   . Hyperlipidemia   . Neuropathy    left brachial plexus  . Pain, dental    Past Surgical History:  Procedure Laterality Date  . AORTIC VALVE REPLACEMENT  1994  . GANGLION CYST EXCISION  JAN 2002  . HIATAL HERNIA REPAIR  DEC 2002  . TONSILLECTOMY    . VASECTOMY  DEC 2002   Family History  Problem Relation Age of Onset  . Hypertension Unknown   . Prostate cancer Unknown   . Leukemia Brother   . Colon cancer Neg Hx   . Pancreatic cancer Neg Hx   . Rectal cancer Neg Hx   . Stomach cancer Neg Hx      Social History   Substance and Sexual Activity  Sexual Activity Not on file    Outpatient Encounter Medications as of 06/10/2017  Medication Sig  . aspirin 81 MG chewable tablet Chew 81 mg by mouth daily.    . finasteride (PROSCAR) 5 MG tablet Take 1 tablet (5 mg total) by mouth daily.  . fluticasone (FLONASE) 50 MCG/ACT nasal spray Place 2 sprays daily into both nostrils.  . metoprolol succinate (TOPROL-XL) 100 MG 24 hr tablet TAKE 1 TABLET BY MOUTH AT BEDTIME  . simvastatin (ZOCOR) 20 MG tablet TAKE 1 TABLET BY MOUTH DAILY  . telmisartan (MICARDIS) 80 MG tablet Take 1 tablet (80 mg total) by mouth daily.  . [DISCONTINUED] fluticasone (FLONASE) 50 MCG/ACT nasal spray Place 2 sprays into both nostrils daily.  . cetirizine (ZYRTEC) 10 MG tablet Take 1 tablet (10 mg total) by mouth daily. (Patient not taking: Reported on 06/10/2017)  . VIAGRA 100 MG tablet    No facility-administered encounter medications on file as of 06/10/2017.     Activities of Daily Living In your present state of health, do you have any difficulty performing the following activities: 06/10/2017 06/10/2017  Hearing? N -  Vision? N -  Difficulty concentrating or making decisions?  N -  Walking or climbing stairs? N -  Dressing or bathing? N -  Doing errands, shopping? N -  Preparing Food and eating ? N N  Using the Toilet? N N  In the past six months, have you accidently leaked urine? N N  Do you have problems with loss of bowel control? N N  Managing your Medications? N N  Managing your Finances? N N  Housekeeping or managing your Housekeeping? N N  Some recent data might be hidden    Patient Care Team: [name], MD as PCP - General [name], MD as Consulting Physician (Cardiology) Grace Isaac, MD as Consulting Physician (Cardiothoracic Surgery) Rana Snare, MD as Consulting Physician (Urology) [place], Jennefer Bravo, MD as Referring Physician (Dermatology) Melissa Montane, MD as  Consulting Physician (Otolaryngology)   Assessment:    Physical assessment deferred to PCP.  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 No structured work out.        Goals    None    Goal: Maintain current health.   Fall Risk Fall Risk  06/10/2017 06/10/2017 06/08/2016 06/03/2015 05/31/2014  Falls in the past year? No No No No No   Depression Screen PHQ 2/9 Scores 06/10/2017 06/10/2017 04/29/2017 12/29/2016  PHQ - 2 Score 0 0 0 0  PHQ- 9 Score - 0 0 0    Cognitive Function       Ad8 score reviewed for issues:  Issues making decisions: no  Less interest in hobbies / activities: no  Repeats questions, stories (family complaining): no  Trouble using ordinary gadgets (microwave, computer, phone): no  Forgets the month or year: no  Mismanaging finances: no  Remembering appts: no  Daily problems with thinking and/or memory: no Ad8 score is=0     Immunization History  Administered Date(s) Administered  . Influenza Split 07/07/2011  . Influenza Whole 05/10/2008, 05/29/2010  . Influenza, Seasonal, Injecte, Preservative Fre 07/12/2012  . Influenza,inj,Quad PF,6+ Mos 05/25/2013, 05/31/2014, 06/03/2015, 06/08/2016, 04/29/2017  . Tdap 07/12/2012  . Zoster 09/08/2012   Declines Shingrix.   Screening Tests Health Maintenance  Topic Date Due  . Hepatitis C Screening  11/01/2017 (Originally 1951/04/11)  . HIV Screening  11/01/2017 (Originally 07/16/1966)  . TETANUS/TDAP  07/12/2022  . COLONOSCOPY  05/14/2024  . INFLUENZA VACCINE  Completed      Plan:     Bring a copy of your living will and/or healthcare  power of attorney to your next office visit.  Continue doing brain stimulating activities (puzzles, reading, adult coloring books, staying active) to keep memory sharp.    I have personally reviewed and noted the following in the patient's chart:   . Medical and social history . Use of alcohol, tobacco or illicit drugs  . Current medications and supplements . Functional ability and status . Nutritional status . Physical activity . Advanced directives . List of other physicians . Hospitalizations, surgeries, and ER visits in previous 12 months . Vitals . Screenings to include cognitive, depression, and falls . Referrals and appointments  In addition, I have reviewed and discussed with patient certain preventive protocols, quality metrics, and best practice recommendations. A written personalized care plan for preventive services as  well as general preventive health recommendations were provided to patient.     Gerilyn Nestle, RN  06/10/2017   Reviewed documentation provided by RN and agree w/ above.  Annye Asa, MD

## 2017-06-10 ENCOUNTER — Ambulatory Visit (INDEPENDENT_AMBULATORY_CARE_PROVIDER_SITE_OTHER): Payer: 59 | Admitting: Family Medicine

## 2017-06-10 ENCOUNTER — Encounter: Payer: Self-pay | Admitting: Family Medicine

## 2017-06-10 ENCOUNTER — Other Ambulatory Visit: Payer: Self-pay

## 2017-06-10 ENCOUNTER — Other Ambulatory Visit (INDEPENDENT_AMBULATORY_CARE_PROVIDER_SITE_OTHER): Payer: 59

## 2017-06-10 VITALS — BP 124/70 | HR 58 | Temp 98.1°F | Resp 16 | Ht 75.0 in | Wt 238.0 lb

## 2017-06-10 DIAGNOSIS — R7309 Other abnormal glucose: Secondary | ICD-10-CM

## 2017-06-10 DIAGNOSIS — Z0000 Encounter for general adult medical examination without abnormal findings: Secondary | ICD-10-CM | POA: Diagnosis not present

## 2017-06-10 DIAGNOSIS — E785 Hyperlipidemia, unspecified: Secondary | ICD-10-CM

## 2017-06-10 DIAGNOSIS — B9789 Other viral agents as the cause of diseases classified elsewhere: Secondary | ICD-10-CM

## 2017-06-10 DIAGNOSIS — I10 Essential (primary) hypertension: Secondary | ICD-10-CM | POA: Diagnosis not present

## 2017-06-10 DIAGNOSIS — J329 Chronic sinusitis, unspecified: Secondary | ICD-10-CM

## 2017-06-10 LAB — CBC WITH DIFFERENTIAL/PLATELET
Basophils Absolute: 0 K/uL (ref 0.0–0.1)
Basophils Relative: 0.6 % (ref 0.0–3.0)
Eosinophils Absolute: 0.1 K/uL (ref 0.0–0.7)
Eosinophils Relative: 2.1 % (ref 0.0–5.0)
HCT: 44.6 % (ref 39.0–52.0)
Hemoglobin: 15 g/dL (ref 13.0–17.0)
Lymphocytes Relative: 25.3 % (ref 12.0–46.0)
Lymphs Abs: 1.5 K/uL (ref 0.7–4.0)
MCHC: 33.5 g/dL (ref 30.0–36.0)
MCV: 92.1 fl (ref 78.0–100.0)
Monocytes Absolute: 0.6 K/uL (ref 0.1–1.0)
Monocytes Relative: 10.2 % (ref 3.0–12.0)
Neutro Abs: 3.7 K/uL (ref 1.4–7.7)
Neutrophils Relative %: 61.8 % (ref 43.0–77.0)
Platelets: 265 K/uL (ref 150.0–400.0)
RBC: 4.84 Mil/uL (ref 4.22–5.81)
RDW: 13.4 % (ref 11.5–15.5)
WBC: 6 K/uL (ref 4.0–10.5)

## 2017-06-10 LAB — LIPID PANEL
Cholesterol: 125 mg/dL (ref 0–200)
HDL: 34.7 mg/dL — ABNORMAL LOW
LDL Cholesterol: 75 mg/dL (ref 0–99)
NonHDL: 89.91
Total CHOL/HDL Ratio: 4
Triglycerides: 76 mg/dL (ref 0.0–149.0)
VLDL: 15.2 mg/dL (ref 0.0–40.0)

## 2017-06-10 LAB — BASIC METABOLIC PANEL
BUN: 14 mg/dL (ref 6–23)
CHLORIDE: 105 meq/L (ref 96–112)
CO2: 26 meq/L (ref 19–32)
CREATININE: 0.83 mg/dL (ref 0.40–1.50)
Calcium: 9.3 mg/dL (ref 8.4–10.5)
GFR: 98.86 mL/min (ref >60.00)
GLUCOSE: 111 mg/dL — AB (ref 70–99)
POTASSIUM: 4.4 meq/L (ref 3.5–5.1)
Sodium: 139 mEq/L (ref 135–145)

## 2017-06-10 LAB — HEPATIC FUNCTION PANEL
ALT: 22 U/L (ref 0–53)
AST: 17 U/L (ref 0–37)
Albumin: 4.3 g/dL (ref 3.5–5.2)
Alkaline Phosphatase: 55 U/L (ref 39–117)
Bilirubin, Direct: 0.2 mg/dL (ref 0.0–0.3)
Total Bilirubin: 0.9 mg/dL (ref 0.2–1.2)
Total Protein: 6.8 g/dL (ref 6.0–8.3)

## 2017-06-10 LAB — TSH: TSH: 2.91 u[IU]/mL (ref 0.35–4.50)

## 2017-06-10 MED ORDER — FLUTICASONE PROPIONATE 50 MCG/ACT NA SUSP: 2.0000 | Freq: Every day | NASAL | 6 refills | Status: DC

## 2017-06-10 NOTE — Assessment & Plan Note (Signed)
Chronic problem.  Well controlled today.  Asymptomatic.  Check labs.  No anticipated med changes.  Will follow. 

## 2017-06-10 NOTE — Assessment & Plan Note (Signed)
Chronic problem.  Tolerating statin w/o difficulty.  Encouraged healthy diet and regular exercise.  Check labs.  Adjust meds prn. 

## 2017-06-10 NOTE — Assessment & Plan Note (Signed)
Pt's PE unchanged from previous and WNL w/ exception of aortic valve and L brachial nerve palsy.  UTD on colonoscopy, urology, immunizations.  Check labs.  Anticipatory guidance provided.

## 2017-06-10 NOTE — Patient Instructions (Addendum)
Follow up in 6 months to recheck BP and cholesterol We'll notify you of your lab results and make any changes if needed Continue to work on healthy diet and regular exercise- you look great! Call with any questions or concerns Happy Thanksgiving!!!  Bring a copy of your living will and/or healthcare power of attorney to your next office visit.  Continue doing brain stimulating activities (puzzles, reading, adult coloring books, staying active) to keep memory sharp.    Health Maintenance, Male A healthy lifestyle and preventive care is important for your health and wellness. Ask your health care provider about what schedule of regular examinations is right for you. What should I know about weight and diet? Eat a Healthy Diet  Eat plenty of vegetables, fruits, whole grains, low-fat dairy products, and lean protein.  Do not eat a lot of foods high in solid fats, added sugars, or salt.  Maintain a Healthy Weight Regular exercise can help you achieve or maintain a healthy weight. You should:  Do at least 150 minutes of exercise each week. The exercise should increase your heart rate and make you sweat (moderate-intensity exercise).  Do strength-training exercises at least twice a week.  Watch Your Levels of Cholesterol and Blood Lipids  Have your blood tested for lipids and cholesterol every 5 years starting at 65 years of age. If you are at high risk for heart disease, you should start having your blood tested when you are 64 years old. You may need to have your cholesterol levels checked more often if: ? Your lipid or cholesterol levels are high. ? You are older than 65 years of age. ? You are at high risk for heart disease.  What should I know about cancer screening? Many types of cancers can be detected early and may often be prevented. Lung Cancer  You should be screened every year for lung cancer if: ? You are a current smoker who has smoked for at least 30 years. ? You are a  former smoker who has quit within the past 15 years.  Talk to your health care provider about your screening options, when you should start screening, and how often you should be screened.  Colorectal Cancer  Routine colorectal cancer screening usually begins at 65 years of age and should be repeated every 5-10 years until you are 65 years old. You may need to be screened more often if early forms of precancerous polyps or small growths are found. Your health care provider may recommend screening at an earlier age if you have risk factors for colon cancer.  Your health care provider may recommend using home test kits to check for hidden blood in the stool.  A small camera at the end of a tube can be used to examine your colon (sigmoidoscopy or colonoscopy). This checks for the earliest forms of colorectal cancer.  Prostate and Testicular Cancer  Depending on your age and overall health, your health care provider may do certain tests to screen for prostate and testicular cancer.  Talk to your health care provider about any symptoms or concerns you have about testicular or prostate cancer.  Skin Cancer  Check your skin from head to toe regularly.  Tell your health care provider about any new moles or changes in moles, especially if: ? There is a change in a mole's size, shape, or color. ? You have a mole that is larger than a pencil eraser.  Always use sunscreen. Apply sunscreen liberally and repeat throughout  the day.  Protect yourself by wearing long sleeves, pants, a wide-brimmed hat, and sunglasses when outside.  What should I know about heart disease, diabetes, and high blood pressure?  If you are 38-39 years of age, have your blood pressure checked every 3-5 years. If you are 39 years of age or older, have your blood pressure checked every year. You should have your blood pressure measured twice-once when you are at a hospital or clinic, and once when you are not at a hospital or  clinic. Record the average of the two measurements. To check your blood pressure when you are not at a hospital or clinic, you can use: ? An automated blood pressure machine at a pharmacy. ? A home blood pressure monitor.  Talk to your health care provider about your target blood pressure.  If you are between 2-52 years old, ask your health care provider if you should take aspirin to prevent heart disease.  Have regular diabetes screenings by checking your fasting blood sugar level. ? If you are at a normal weight and have a low risk for diabetes, have this test once every three years after the age of 45. ? If you are overweight and have a high risk for diabetes, consider being tested at a younger age or more often.  A one-time screening for abdominal aortic aneurysm (AAA) by ultrasound is recommended for men aged 22-75 years who are current or former smokers. What should I know about preventing infection? Hepatitis B If you have a higher risk for hepatitis B, you should be screened for this virus. Talk with your health care provider to find out if you are at risk for hepatitis B infection. Hepatitis C Blood testing is recommended for:  Everyone born from [birth year] through 1965.  Anyone with known risk factors for hepatitis C.  Sexually Transmitted Diseases (STDs)  You should be screened each year for STDs including gonorrhea and chlamydia if: ? You are sexually active and are younger than 65 years of age. ? You are older than 65 years of age and your health care provider tells you that you are at risk for this type of infection. ? Your sexual activity has changed since you were last screened and you are at an increased risk for chlamydia or gonorrhea. Ask your health care provider if you are at risk.  Talk with your health care provider about whether you are at high risk of being infected with HIV. Your health care provider may recommend a prescription medicine to help prevent HIV  infection.  What else can I do?  Schedule regular health, dental, and eye exams.  Stay current with your vaccines (immunizations).  Do not use any tobacco products, such as cigarettes, chewing tobacco, and e-cigarettes. If you need help quitting, ask your health care provider.  Limit alcohol intake to no more than 2 drinks per day. One drink equals 12 ounces of beer, 5 ounces of wine, or 1 ounces of hard liquor.  Do not use street drugs.  Do not share needles.  Ask your health care provider for help if you need support or information about quitting drugs.  Tell your health care provider if you often feel depressed.  Tell your health care provider if you have ever been abused or do not feel safe at home. This information is not intended to replace advice given to you by your health care provider. Make sure you discuss any questions you have with your health care provider. Document  Released: 01/16/2008 Document Revised: 03/18/2016 Document Reviewed: 04/23/2015 Elsevier Interactive Patient Education  [employer].

## 2017-06-10 NOTE — Progress Notes (Signed)
   Subjective:    Patient ID: [name] [name], male    DOB: 15-Feb-1951, 66 y.o.   MRN: [id number]  HPI CPE- UTD on colonoscopy, Tdap, flu shot.  Sees Dr Risa Grill twice yearly.     Review of Systems Patient reports no vision/hearing changes, anorexia, fever ,adenopathy, persistant/recurrent hoarseness, swallowing issues, chest pain, palpitations, edema, persistant/recurrent cough, hemoptysis, dyspnea (rest,exertional, paroxysmal nocturnal), gastrointestinal  bleeding (melena, rectal bleeding), abdominal pain, excessive heart burn, GU symptoms (dysuria, hematuria, voiding/incontinence issues) syncope, focal weakness, memory loss, numbness & tingling, skin/hair/nail changes, depression, anxiety, abnormal bruising/bleeding, musculoskeletal symptoms/signs.   + L sided TMJ    Objective:   Physical Exam General Appearance:    Alert, cooperative, no distress, appears stated age  Head:    Normocephalic, without obvious abnormality, atraumatic  Eyes:    PERRL, conjunctiva/corneas clear, EOM's intact, fundi    benign, both eyes       Ears:    Normal TM's and external ear canals, both ears  Nose:   Nares normal, septum midline, mucosa normal, no drainage   or sinus tenderness  Throat:   Lips, mucosa, and tongue normal; teeth and gums normal  Neck:   Supple, symmetrical, trachea midline, no adenopathy;       thyroid:  No enlargement/tenderness/nodules  Back:     Symmetric, no curvature, ROM normal, no CVA tenderness  Lungs:     Clear to auscultation bilaterally, respirations unlabored  Chest wall:    No tenderness or deformity  Heart:    Regular rate and rhythm, mechanical valve  Abdomen:     Soft, non-tender, bowel sounds active all four quadrants,    no masses, no organomegaly  Genitalia:    Deferred to Urology  Rectal:    Extremities:   Extremities normal, atraumatic, no cyanosis or edema  Pulses:   2+ and symmetric all extremities  Skin:   Skin color, texture, turgor normal, no rashes or  lesions  Lymph nodes:   Cervical, supraclavicular, and axillary nodes normal  Neurologic:   CNII-XII intact. L brachial plexus palsy          Assessment & Plan:

## 2017-06-11 LAB — HEMOGLOBIN A1C: HEMOGLOBIN A1C: 6.2 % (ref 4.6–6.5)

## 2017-08-12 ENCOUNTER — Other Ambulatory Visit: Payer: Self-pay | Admitting: Family Medicine

## 2017-08-19 ENCOUNTER — Other Ambulatory Visit: Payer: Self-pay

## 2017-08-19 ENCOUNTER — Ambulatory Visit (INDEPENDENT_AMBULATORY_CARE_PROVIDER_SITE_OTHER): Payer: Medicare Other | Admitting: Family Medicine

## 2017-08-19 ENCOUNTER — Encounter: Payer: Self-pay | Admitting: Family Medicine

## 2017-08-19 VITALS — BP 122/82 | HR 16 | Temp 98.1°F | Resp 16 | Ht 75.0 in | Wt 240.4 lb

## 2017-08-19 DIAGNOSIS — J40 Bronchitis, not specified as acute or chronic: Secondary | ICD-10-CM

## 2017-08-19 MED ORDER — GUAIFENESIN-CODEINE 100-10 MG/5ML PO SYRP: 10.0000 mL | ORAL_SOLUTION | Freq: Three times a day (TID) | ORAL | 0 refills | Status: DC | PRN

## 2017-08-19 MED ORDER — AZITHROMYCIN 250 MG PO TABS
ORAL_TABLET | ORAL | 0 refills | Status: DC
Start: 2017-08-19 — End: 2017-10-05

## 2017-08-19 NOTE — Progress Notes (Signed)
   Subjective:    Patient ID: [name] [name], male    DOB: 1951/05/17, 67 y.o.   MRN: [id number]  HPI URI- pt was seen on 1/14 by ENT and dx'd w/ sinusitis.  He was prescribed Clinda but has not started this.  Pt had a '40 minute' coughing spell last night that 'I didn't know if I was going to recover from'.  sxs started ~10 days ago.  No fevers.  + nasal congestion.  Cough is intermittently productive.  L ear pain- dx of TMJ.  + HA.  Denies facial pain/pressure.  No N/V.  + wheezing and SOB.  Not currently using Zyrtec or Flonase.  No known sick contacts.   Review of Systems For ROS see HPI     Objective:   Physical Exam  Constitutional: He appears well-developed and well-nourished. No distress.  HENT:  Head: Normocephalic and atraumatic.  Right Ear: Tympanic membrane normal.  Left Ear: Tympanic membrane normal.  Nose: No mucosal edema or rhinorrhea. Right sinus exhibits no maxillary sinus tenderness and no frontal sinus tenderness. Left sinus exhibits no maxillary sinus tenderness and no frontal sinus tenderness.  Mouth/Throat: Mucous membranes are normal. No oropharyngeal exudate, posterior oropharyngeal edema or posterior oropharyngeal erythema.  Copious PND  Eyes: Conjunctivae and EOM are normal. Pupils are equal, round, and reactive to light.  Neck: Normal range of motion. Neck supple.  Cardiovascular: Normal rate, regular rhythm and normal heart sounds.  Pulmonary/Chest: Effort normal and breath sounds normal. No respiratory distress. He has no wheezes.  + hacking cough  Lymphadenopathy:    He has no cervical adenopathy.  Skin: Skin is warm and dry.          Assessment & Plan:  Bronchitis- new.  Given pt's upcoming visit w/ grandchildren and international travel, will start [place].  Start cough meds prn.  Reviewed supportive care and red flags that should prompt return.  Pt expressed understanding and is in agreement w/ plan.

## 2017-08-19 NOTE — Patient Instructions (Signed)
Follow up as needed or as scheduled START the Azithromycin as directed DO NOT take the Clindamycin Use the cough syrup as needed- may cause drowsiness Mucinex DM for daytime cough w/o drowsiness Drink plenty of fluids REST! Call with any questions or concerns Enjoy your trip!!!

## 2017-09-05 ENCOUNTER — Other Ambulatory Visit: Payer: Self-pay | Admitting: Family Medicine

## 2017-09-06 ENCOUNTER — Other Ambulatory Visit (HOSPITAL_COMMUNITY): Payer: Self-pay | Admitting: *Deleted

## 2017-09-06 DIAGNOSIS — I716 Thoracoabdominal aortic aneurysm, without rupture, unspecified: Secondary | ICD-10-CM

## 2017-09-06 DIAGNOSIS — I719 Aortic aneurysm of unspecified site, without rupture: Secondary | ICD-10-CM

## 2017-09-06 NOTE — Addendum Note (Signed)
Addended by: Scarlette Calico on: 09/06/2017 03:53 PM   Modules accepted: Orders

## 2017-09-06 NOTE — Addendum Note (Signed)
Encounter addended by: Scarlette Calico, RN on: 09/06/2017 1:41 PM  Actions taken: Visit diagnoses modified, Diagnosis association updated, Order list changed

## 2017-09-14 ENCOUNTER — Ambulatory Visit: Payer: Medicare Other | Admitting: Cardiothoracic Surgery

## 2017-09-29 ENCOUNTER — Ambulatory Visit (HOSPITAL_COMMUNITY)
Admission: RE | Admit: 2017-09-29 | Discharge: 2017-09-29 | Disposition: A | Discharge status: Home / Self Care | Payer: Medicare Other | Source: Ambulatory Visit | Attending: Cardiology | Admitting: Cardiology

## 2017-09-29 DIAGNOSIS — I719 Aortic aneurysm of unspecified site, without rupture: Secondary | ICD-10-CM | POA: Insufficient documentation

## 2017-09-29 LAB — BASIC METABOLIC PANEL
ANION GAP: 10 (ref 5–15)
BUN: 13 mg/dL (ref 6–20)
CHLORIDE: 105 mmol/L (ref 101–111)
CO2: 21 mmol/L — ABNORMAL LOW (ref 22–32)
Calcium: 9.1 mg/dL (ref 8.9–10.3)
Creatinine, Ser: 0.88 mg/dL (ref 0.61–1.24)
GFR calc Af Amer: >60 mL/min (ref >60)
GFR calc non Af Amer: >60 mL/min (ref >60)
GLUCOSE: 151 mg/dL — AB (ref 65–99)
POTASSIUM: 4.2 mmol/L (ref 3.5–5.1)
Sodium: 136 mmol/L (ref 135–145)

## 2017-10-01 ENCOUNTER — Ambulatory Visit
Admission: RE | Admit: 2017-10-01 | Discharge: 2017-10-01 | Disposition: A | Discharge status: Home / Self Care | Payer: Medicare Other | Source: Ambulatory Visit | Attending: Cardiology | Admitting: Cardiology

## 2017-10-01 DIAGNOSIS — I719 Aortic aneurysm of unspecified site, without rupture: Secondary | ICD-10-CM

## 2017-10-01 MED ORDER — GADOBENATE DIMEGLUMINE 529 MG/ML IV SOLN
20.0000 mL | Freq: Once | INTRAVENOUS | Status: DC | PRN
Start: 2017-10-01 — End: 2017-10-02

## 2017-10-05 ENCOUNTER — Encounter (HOSPITAL_COMMUNITY): Payer: Self-pay | Admitting: Cardiology

## 2017-10-05 ENCOUNTER — Ambulatory Visit (HOSPITAL_COMMUNITY)
Admission: RE | Admit: 2017-10-05 | Discharge: 2017-10-05 | Disposition: A | Discharge status: Home / Self Care | Payer: Medicare Other | Source: Ambulatory Visit | Attending: Cardiology | Admitting: Cardiology

## 2017-10-05 VITALS — BP 140/88 | HR 71 | Wt 243.8 lb

## 2017-10-05 DIAGNOSIS — Z954 Presence of other heart-valve replacement: Secondary | ICD-10-CM

## 2017-10-05 DIAGNOSIS — I4891 Unspecified atrial fibrillation: Secondary | ICD-10-CM | POA: Diagnosis not present

## 2017-10-05 DIAGNOSIS — E785 Hyperlipidemia, unspecified: Secondary | ICD-10-CM | POA: Diagnosis not present

## 2017-10-05 DIAGNOSIS — I061 Rheumatic aortic insufficiency: Secondary | ICD-10-CM | POA: Insufficient documentation

## 2017-10-05 DIAGNOSIS — N400 Enlarged prostate without lower urinary tract symptoms: Secondary | ICD-10-CM | POA: Insufficient documentation

## 2017-10-05 DIAGNOSIS — Z952 Presence of prosthetic heart valve: Secondary | ICD-10-CM | POA: Insufficient documentation

## 2017-10-05 DIAGNOSIS — I712 Thoracic aortic aneurysm, without rupture: Secondary | ICD-10-CM | POA: Diagnosis not present

## 2017-10-05 DIAGNOSIS — Z9889 Other specified postprocedural states: Secondary | ICD-10-CM | POA: Insufficient documentation

## 2017-10-05 DIAGNOSIS — I719 Aortic aneurysm of unspecified site, without rupture: Secondary | ICD-10-CM

## 2017-10-05 DIAGNOSIS — Z8249 Family history of ischemic heart disease and other diseases of the circulatory system: Secondary | ICD-10-CM | POA: Insufficient documentation

## 2017-10-05 DIAGNOSIS — I10 Essential (primary) hypertension: Secondary | ICD-10-CM | POA: Insufficient documentation

## 2017-10-05 DIAGNOSIS — Z79899 Other long term (current) drug therapy: Secondary | ICD-10-CM | POA: Insufficient documentation

## 2017-10-05 DIAGNOSIS — Z8042 Family history of malignant neoplasm of prostate: Secondary | ICD-10-CM | POA: Insufficient documentation

## 2017-10-05 DIAGNOSIS — Z7982 Long term (current) use of aspirin: Secondary | ICD-10-CM | POA: Insufficient documentation

## 2017-10-05 DIAGNOSIS — I359 Nonrheumatic aortic valve disorder, unspecified: Secondary | ICD-10-CM | POA: Diagnosis not present

## 2017-10-05 NOTE — Patient Instructions (Signed)
Your physician has requested that you have an echocardiogram. Echocardiography is a painless test that uses sound waves to create images of your heart. It provides your doctor with information about the size and shape of your heart and how well your heart's chambers and valves are working. This procedure takes approximately one hour. There are no restrictions for this procedure.  Check BP daily for 2 weeks then call with readings at [phone] opt. [phone]  Your physician recommends that you schedule a follow-up appointment in: 6 months (September, 2019) Please Call an Schedule Appointment

## 2017-10-05 NOTE — Progress Notes (Signed)
Patient ID: [name] [name], male   DOB: [birth year], 67 y.o.   MRN: [id number] PCP: Dr. Birdie Riddle Cardiology: Dr. Aundra Dubin  67 y.o. with history of aortic insufficiency s/p Ross procedure and aortic root aneurysm presents for followup of Ross procedure.  He has a left-sided brachial plexopathy of uncertain etiology and has atrophy of his left shoulder and upper arm.  He has developed weakness in his right hand as well as occasional vertigo-type spells.  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    He has been stable symptomatically. He walks for exercise and plays golf when it is not too wet.  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  No chest pain, no exertional dyspnea, no lightheadedness or syncope.  Main complaint is arthritis pain.  BP is above goal when checked in the office today.  Most recent echo in 8/18 showed no regurgitation or stenosis of autograft in aortic position, bioprosthetic pulmonic valve with mild pulmonic stenosis, mean gradient 10 mmHg.  EF was read as 40-45%, which is lower than in the past.  MRA chest 3/19 showed 5.7 cm dilation of pulmonary autograft at level of sinuses of Valsalva.  He is being followed for aortic root aneurysm by Dr. Servando Snare.   Labs (2/14): K 3.7, creatinine 0.9, LDL 63, HDL 29 Labs (3/14): BNP 36 Labs (2/15): K 3.7, creatinine 0.8, LDL 61, HDL 30 Labs (4/16): K 3.7, creatinine 0.8, LDL 51, HDL 34 Labs (5/16): HCT 44.9 Labs (11/17): LDL 74, HDL 40, K 4.1, creatinine 0.77 Labs (11/18): LDL 75, HDL 35 Labs (2/19): K 4.2, creatinine 0.88  PMH: 1. Aortic insufficiency: s/p Ross procedure in 1994 in [place].   - Echo (4/11) with hypokinetic basal septum (likely post-surgical), EF  50%, mild LVH, s/p Ross procedure with native pulmonic valve in aortic position with mild aortic insufficiency and bioprosthetic pulmonic valve with no pulmonic insufficiency, mild MR, aortic upper normal in size.  - Echo (4/13) with EF 55%, mild LVH, mild AI, mild MR, mild RV dilation, bioprosthetic pulmonic valve with peak pressure 25 mmHg.   - Echo (3/14): Mild LV dilation, mild LVH, EF 60%, pulmonary valve in aortic position (s/p Ross) with mild AI and mildly dilated ascending aorta to 4.3 cm, mildly dilated RV with normal systolic function, bioprosthetic PVR with mean gradient 23 mmHg.  Echo (3/15) with EF 55-60%, mild LVH, mild AI with no AS, bioprosthetic pulmonic valve with peak gradient 31 mmHg, ascending aorta 4.4 cm.   - Cardiac MRI/MRA chest (5/15) with mild regurgitation of autograft aortic valve and mild regurgitation of homograft pulmonic valve, dilation of aortic root at sinuses of valsalva (4.8 cm), EF 60%, no LGE, mild RV dilation with normal RV systolic function.   - Echo (5/16) with EF 55-60%, mild autograft AI, mild MR, mildly decreased RV systolic function, biatrial enlargement, RVSP 35 mmHg, trivially increased gradient across bioprosthetic pulmonic valve (no progression).  - Echo (8/17): EF 55-60%, pulmonary autograft in aortic valve position, mild AI, bioprosthetic pulmonary valve with mild PS (peak 22 mmHg), moderate RV dilation with normal systolic function.  - Echo (8/18): EF 40-45%, mildly increased gradient across bioprosthetic pulmonary valve (mean 10 mmHg), s/p Ross procedure with no stenosis  or significant regurgitation of pulmonary autograft in aortic position.  2. Post-operative atrial fibrillation after heart surgery.  3. Normal left heart cath prior to 1994 heart surgery.  4. Left brachial plexopathy with left shoulder muscle atrophy.  Uncertain etiology.  5. BPH 6. Hyperlipidemia.  7. Cervical spinal stenosis with radiculopathy.   8. 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  9. ETT-Sestamibi (3/14): No ischemia or infarction.  10. Carotid dopplers (6/14): mild disease only.  11. Sinus of valsalva aneurysm: MRA chest 2/18 with dilation of pulmonary autograft at level of sinuses of Valsalva to 5.4 cm when measured into left cusp.  - MRA chest (3/19): Saccular dilatation of the left and right sinuses of valsalva, 5.7 cm aortic root diameter.   12. Elevated left hemidiaphragm.   FH: Prostate CA, HTN  SH: Lives in [place], owns a window washing business, married, nonsmoker.   ROS: All systems reviewed and negative except as per HPI.    Current Outpatient Medications  Medication Sig Dispense Refill  . aspirin 81 MG chewable tablet Chew 81 mg by mouth daily.      . finasteride (PROSCAR) 5 MG tablet TAKE 1 TABLET(5 MG) BY MOUTH DAILY 90 tablet 0  . guaiFENesin-codeine (ROBITUSSIN AC) 100-10 MG/5ML syrup Take 10 mLs by mouth 3 (three) times daily as needed for cough. 240 mL 0  . metoprolol succinate (TOPROL-XL) 100 MG 24 hr tablet TAKE 1 TABLET BY MOUTH AT BEDTIME 90 tablet 3  . simvastatin (ZOCOR) 20 MG tablet TAKE 1 TABLET BY MOUTH DAILY 30 tablet 11  . telmisartan (MICARDIS) 80 MG tablet TAKE 1 TABLET(80 MG) BY MOUTH DAILY 30 tablet 6  . VIAGRA 100 MG tablet     . cetirizine (ZYRTEC) 10 MG tablet Take 1 tablet (10 mg total) by mouth daily. (Patient not taking: Reported on 06/10/2017) 30 tablet 11  . fluticasone (FLONASE) 50 MCG/ACT nasal spray Place 2 sprays daily into both nostrils. (Patient not taking: Reported on 10/05/2017) 16 g 6   No current facility-administered medications for this encounter.     BP 140/88   Pulse 71   Wt 243 lb 12.8 oz (110.6 kg)   SpO2 97%   BMI 30.47 kg/m  General: NAD Neck: No JVD, no thyromegaly or thyroid nodule.  Lungs: Clear to auscultation bilaterally with normal respiratory effort. CV: Nondisplaced PMI.  Heart regular S1/S2 with widely split  S2, no S3/S4, 1/6 SEM RUSB.  No peripheral edema.  No carotid bruit.  Normal pedal pulses.  Abdomen: Soft, nontender, no hepatosplenomegaly, no distention.  Skin: Intact without lesions or rashes.  Neurologic: Alert and oriented x 3.  Psych: Normal affect. Extremities: No clubbing or cyanosis.  HEENT: Normal.   Assessment/Plan: 1.  History of Ross Procedure: No significant AI or AS and mildly elevated bioprosthetic pulmonic valve gradient on echo in 8/18, stable.  No exertional dyspnea.  EF was read as low at 40-45% on 8/18 echo, I am not sure this is completely accurate (echo quality not as good).  - Repeat echo to reassess LV systolic function.  - Needs endocarditis prophylaxis with dental work.   2.  Aortic root aneurym: This not uncommonly accompanies Ross procedure.  The pulmonary autograft at sinuses of valsalva measured 5.7 cm by MRA in 3/19, small increase in size.   - He has appt with Dr. Servando Snare later this month, aortic root dilatation is getting into the range  where we would consider prophylactic repair.  - Continue blood pressure control, goal SBP 120s or lower.  3. HTN: Given aneurysm, need to make sure that BP is well-controlled. It is high today but has generally been controlled at home.  He will check BP daily x 2 weeks and call me with readings.  4. Hyperlipidemia: Lipids acceptable when recently checked.   Followup in 6 months if echo does not show low EF.   Loralie Champagne 10/05/2017

## 2017-10-11 ENCOUNTER — Ambulatory Visit (HOSPITAL_COMMUNITY): Payer: Medicare Other | Attending: Cardiovascular Disease

## 2017-10-11 ENCOUNTER — Other Ambulatory Visit: Payer: Self-pay

## 2017-10-11 DIAGNOSIS — I083 Combined rheumatic disorders of mitral, aortic and tricuspid valves: Secondary | ICD-10-CM | POA: Diagnosis not present

## 2017-10-11 DIAGNOSIS — Q2543 Congenital aneurysm of aorta: Secondary | ICD-10-CM | POA: Diagnosis not present

## 2017-10-11 DIAGNOSIS — E785 Hyperlipidemia, unspecified: Secondary | ICD-10-CM | POA: Insufficient documentation

## 2017-10-11 DIAGNOSIS — I10 Essential (primary) hypertension: Secondary | ICD-10-CM | POA: Diagnosis not present

## 2017-10-11 DIAGNOSIS — Z952 Presence of prosthetic heart valve: Secondary | ICD-10-CM | POA: Insufficient documentation

## 2017-10-14 ENCOUNTER — Ambulatory Visit: Payer: Medicare Other | Admitting: Cardiothoracic Surgery

## 2017-10-14 ENCOUNTER — Other Ambulatory Visit: Payer: Self-pay

## 2017-10-14 ENCOUNTER — Encounter: Payer: Self-pay | Admitting: Cardiothoracic Surgery

## 2017-10-14 VITALS — BP 132/78 | HR 69 | Resp 18 | Ht 75.0 in | Wt 243.8 lb

## 2017-10-14 DIAGNOSIS — I7121 Aneurysm of the ascending aorta, without rupture: Secondary | ICD-10-CM

## 2017-10-14 DIAGNOSIS — I712 Thoracic aortic aneurysm, without rupture: Secondary | ICD-10-CM | POA: Diagnosis not present

## 2017-10-14 DIAGNOSIS — Z954 Presence of other heart-valve replacement: Secondary | ICD-10-CM

## 2017-10-14 NOTE — Patient Instructions (Signed)

## 2017-10-14 NOTE — Progress Notes (Signed)
[address]       [place] [address]             [phone]                    Lenard F Bohman Craig Medical Record #[id number] Date of Birth: 13-May-1951  Referring: [name], MD Primary Care: [name], MD  Chief Complaint:    Chief Complaint  Patient presents with  . Thoracic Aortic Aneurysm    f/u with ECHO 10/11/2017 MRA 10/01/2017 and MRI    History of Present Illness:    [name] [name] 67 y.o. male is seen in the office  today for  Follow up after Harrington Challenger procedure done in [place]   for congenital aortic insufficiency  case 25 years ago.  Currently the patient is asymptomatic from the standpoint of heart failure. He denies angina, PND, pedal edema or dyspnea on exertion. He has been noted to have dilatation of his aortic root at the sinuses of Valsalva following his Ross procedure. Reports from echocardiogram in 2002 measured aortic root at 5.1  cm,The patient's mid ascending aorta which is partially native aorta and partial prosthetic graft is not significantly dilated.     1. Aortic insufficiency: s/p Ross procedure in 1994 in [place]. Aortic insufficiency: Echo (4/11) with hypokinetic basal septum (likely post-surgical), EF 50%, mild LVH, s/p Ross procedure with native pulmonic valve in aortic position with mild aortic insufficiency and bioprosthetic pulmonic valve with no pulmonic insufficiency, mild MR, aortic upper normal in size. Echo (4/13) with EF 55%, mild LVH, mild AI, mild MR, mild RV dilation, bioprosthetic pulmonic valve with peak pressure 25 mmHg. Echo (3/14): Mild LV dilation, mild LVH, EF 60%, pulmonary valve in aortic position (s/p Ross) with mild AI and mildly dilated ascending aorta to 4.3 cm, mildly dilated RV with normal systolic function, bioprosthetic PVR with mean gradient 23 mmHg. Echo (3/15) with EF 55-60%, mild LVH, mild AI with no AS, bioprosthetic pulmonic valve with peak gradient 31 mmHg, ascending  aorta 4.4 cm. Cardiac MRI/MRA chest (5/15) with mild regurgitation of autograft aortic valve and mild regurgitation of homograft pulmonic valve, dilation of aortic root at sinuses of valsalva (4.8 cm), EF 6%, no LGE, mild RV dilation with normal RV systolic function. Echo (5/16) with EF 55-60%, mild autograft AI, mild MR, mildly decreased RV systolic function, biatrial enlargement, RVSP 35 mmHg, trivially increased gradient across bioprosthetic pulmonic valve (no progression).  2. Post-operative atrial fibrillation after heart surgery.  3. Normal left heart cath prior to 1994 heart surgery.  4. Left brachial plexopathy with left shoulder muscle atrophy. Uncertain etiology.  5. BPH 6. Hyperlipidemia.  7. Cervical spinal stenosis with radiculopathy.  8. 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  9. ETT-Sestamibi (3/14): No ischemia or infarction.  10. Carotid dopplers (6/14): mild disease only.      Current Activity/ Functional Status:  Patient is independent with mobility/ambulation, transfers, ADL's, IADL's.   Zubrod Score: At the time of surgery this patient's most appropriate activity status/level should be described as: [x]     0    Normal activity, no symptoms []     1    Restricted in physical strenuous activity but ambulatory, able to do out light work []     2    Ambulatory and capable of self care, unable to do work activities,  up and about               >50 % of waking hours                              []     3    Only limited self care, in bed greater than 50% of waking hours []     4    Completely disabled, no self care, confined to bed or chair []     5    Moribund   Past Medical History:  Diagnosis Date  . Acute gastritis   . Aortic insufficiency    s/p aortic valve replacement in 1994  . Early satiety   . Esophageal stricture   . GERD (gastroesophageal reflux disease)   . Hiatal hernia   .  Hyperlipidemia   . Neuropathy    left brachial plexus  . Pain, dental     Past Surgical History:  Procedure Laterality Date  . AORTIC VALVE REPLACEMENT  1994  . GANGLION CYST EXCISION  JAN 2002  . HIATAL HERNIA REPAIR  DEC 2002  . TONSILLECTOMY    . VASECTOMY  DEC 2002    Family History  Problem Relation Age of Onset  . Hypertension Unknown   . Prostate cancer Unknown   . Leukemia Brother   . Colon cancer Neg Hx   . Pancreatic cancer Neg Hx   . Rectal cancer Neg Hx   . Stomach cancer Neg Hx     Social History   Socioeconomic History  . Marital status: Married    Spouse name: Not on file  . Number of children: 3  . Years of education: Not on file  . Highest education level: Not on file  Social Needs  . Financial resource strain: Not on file  . Food insecurity - worry: Not on file  . Food insecurity - inability: Not on file  . Transportation needs - medical: Not on file  . Transportation needs - non-medical: Not on file  Occupational History  . Not on file  Tobacco Use  . Smoking status: Never Smoker  . Smokeless tobacco: Never Used  Substance and Sexual Activity  . Alcohol use: No  . Drug use: No  . Sexual activity: Not on file  Other Topics Concern  . Not on file  Social History Narrative  . Not on file    Social History   Tobacco Use  Smoking Status Never Smoker  Smokeless Tobacco Never Used    Social History   Substance and Sexual Activity  Alcohol Use No     Allergies  Allergen Reactions  . Penicillins Hives and Swelling  . Quinidine Other (See Comments)    Increase heart beat per patient    Current Outpatient Medications  Medication Sig Dispense Refill  . aspirin 81 MG chewable tablet Chew 81 mg by mouth daily.      . cetirizine (ZYRTEC) 10 MG tablet Take 1 tablet (10 mg total) by mouth daily. 30 tablet 11  . finasteride (PROSCAR) 5 MG tablet TAKE 1 TABLET(5 MG) BY MOUTH DAILY 90 tablet 0  . fluticasone (FLONASE) 50 MCG/ACT nasal  spray Place 2 sprays daily into both nostrils. 16 g 6  . metoprolol succinate (TOPROL-XL) 100 MG 24 hr tablet TAKE 1 TABLET BY MOUTH AT BEDTIME 90 tablet 3  . simvastatin (ZOCOR) 20 MG tablet TAKE 1 TABLET BY MOUTH DAILY 30 tablet 11  .  telmisartan (MICARDIS) 80 MG tablet TAKE 1 TABLET(80 MG) BY MOUTH DAILY 30 tablet 6  . VIAGRA 100 MG tablet      No current facility-administered medications for this visit.       Review of Systems:  Review of Systems  Constitutional: Negative.   HENT: Negative.   Eyes: Positive for blurred vision.  Respiratory: Negative.   Cardiovascular: Negative for chest pain, palpitations, orthopnea, claudication, leg swelling and PND.  Gastrointestinal: Negative.   Genitourinary: Negative.   Musculoskeletal: Negative.   Skin: Negative.   Neurological: Positive for dizziness. Negative for tingling, tremors, sensory change, speech change, focal weakness, seizures, loss of consciousness and headaches.  Endo/Heme/Allergies: Negative.   Psychiatric/Behavioral: Negative.        Immunizations: Flu up to date [ y ]; Pneumococcal up to date [ y ];   Physical Exam: BP 132/78 (BP Location: Left Arm, Patient Position: Sitting, Cuff Size: Large)   Pulse 69   Resp 18   Ht 6\' 3"  (1.905 m)   Wt 243 lb 12.8 oz (110.6 kg)   SpO2 98% Comment: RA  BMI 30.47 kg/m   PHYSICAL EXAMINATION: General appearance: alert, cooperative and appears stated age Head: Normocephalic, without obvious abnormality, atraumatic Neck: no adenopathy, no carotid bruit, no JVD, supple, symmetrical, trachea midline and thyroid not enlarged, symmetric, no tenderness/mass/nodules Lymph nodes: Cervical, supraclavicular, and axillary nodes normal. Resp: clear to auscultation bilaterally Back: symmetric, no curvature. ROM normal. No CVA tenderness. Cardio: regular rate and rhythm, S1, S2 normal, no murmur, click, rub or gallop GI: soft, non-tender; bowel sounds normal; no masses,  no  organomegaly Extremities: extremities normal, atraumatic, no cyanosis or edema Neurologic: Grossly normal    Diagnostic Studies & Laboratory data:     Recent Radiology Findings:   Mr Angiogram Chest W Wo Contrast  Result Date: 10/01/2017 CLINICAL DATA:  Aortic valve replacement, Ross procedure in 1994. Follow-up pulmonary autograft. EXAM: MRA CHEST WITH OR WITHOUT CONTRAST TECHNIQUE: Angiographic images of the chest were obtained using MRA technique without and with intravenous contrast. CONTRAST:  20 mL MultiHance COMPARISON:  MRI 03/24/2017 FINDINGS: VASCULAR Aorta: Again noted is dilatation and mild irregularity of the pulmonary autograft at the aortic root. Again noted is saccular dilatation of the left and right aortic sinuses. Maximum size of the sinuses measures up to 5.7 cm on the sagittal obliques, sequence 6, image 12 and this is similar to the previous examination. Stable measurements compared to the exam on 09/04/2016. The diameter of the aortic root measuring into the right coronary sinus is 5.1 cm and stable. Maximum diameter measuring into the left coronary sinuses measures 6.3 cm on sequence 9, image 92 and this is stable. Stable appearance of the anastomosis in the ascending thoracic aorta. Mild narrowing at the anastomosis measuring roughly 2.6 cm is stable. The ascending thoracic aorta just beyond the anastomosis measures up to the 3.5 cm and this is stable. The aortic arch and great vessels are patent. Normal arch anatomy. No evidence for an aortic dissection. Aortic arch diameter is roughly 3.3 cm. Proximal descending thoracic aorta is 3.2 cm and stable. Mid descending thoracic aorta measures 3.4 cm and minimally changed. Heart: Heart size is within normal limits and stable. Previous median sternotomy. Pulmonary Arteries: Normal caliber of the pulmonary arteries. Incomplete evaluation of the pulmonary arteries. However, the main and central pulmonary arteries appear to be patent.  Other: Chronic elevation of the left hemidiaphragm. No gross lung abnormalities. IMPRESSION: Stable dilatation and appearance of the  aortic root pulmonary autograft. Stable appearance of the aortic anastomosis, aortic arch and descending thoracic aorta. No evidence for aortic dissection. Electronically Signed   By: Markus Daft M.D.   On: 10/01/2017 11:21   I have independently reviewed the above radiology studies  and reviewed the findings with the patient.  Mr Angiogram Chest W Wo Contrast  Result Date: 09/09/2016 CLINICAL DATA:  Status post Harrington Challenger procedure with follow-up of dilated aortic root. EXAM: MRA CHEST WITH OR WITHOUT CONTRAST TECHNIQUE: Preliminary sequences were obtained prior to administration of contrast. Angiographic images of the chest were obtained using MRA technique with intravenous contrast. CONTRAST:  48mL MULTIHANCE GADOBENATE DIMEGLUMINE 529 MG/ML IV SOLN COMPARISON:  08/19/2015 FINDINGS: VASCULAR Aorta: Dilatation of the pulmonary autograft at the level of the sinuses of Valsalva appears grossly stable. Maximal diameter measuring into the left cusp is approximately 5.4 cm. Diameter measuring into the right cusp is approximately 5.1 cm. Diameter measuring into the non coronary cusp is approximately 5.0 cm. Anastomosis of autograft and native aorta appears stable and measures 2.4 cm. Ascending aorta measures 3.3 cm. Aortic arch measures 3 cm. Descending thoracic aorta measures 2.9 cm. No evidence of aortic dissection. Proximal great vessels show stable and normal patency. Heart: Heart size appears stable and normal. No pericardial fluid identified. Pulmonary Vasculature: No dilatation of pulmonary arteries. Stable appearance of central pulmonary veins. NON-VASCULAR Stable elevation of left hemidiaphragm. No lymphadenopathy or masses identified. No pleural fluid or pulmonary abnormalities identified. IMPRESSION: Stable dilatation of pulmonary autograft at the level of the sinuses of Valsalva.  Stable relative narrowing of autograft anastomosis with the native aorta. No evidence of aortic dissection. Electronically Signed   By: Aletta Edouard M.D.   On: 09/09/2016 13:43    Mr Angiogram Chest W Wo Contrast  08/19/2015  CLINICAL DATA:  S/p Ross procedure with dilated aortic root. EXAM: CARDIAC MRI TECHNIQUE: The patient was scanned on a 1.5 Tesla GE magnet. A dedicated cardiac coil was used. FIESTA sequences were done for functional assessment. The patient received 25 cc of Multihance contrast for MR angiography. CONTRAST:  25 cc Multihance FINDINGS: The patient was s/p sternotomy. Limited images of lung fields showed no gross abnormalities. There was a pulmonary valve and artery autograft in the aortic position. The autograft valve was trileaflet with mild autograft valve insufficiency. There did not appear to be autograft valve stenosis. There was a homograft in the pulmonary position. The homograft valve was thickened with probably mild regurgitation. There was turbulent flow across the homograft valve suggestive of some degree of stenosis (this is better assessed by echo). The autograft in the aortic position had dilated sinuses of valsalva, especially the left coronary sinus (SOV measured 5.2 cm when measured into the left coronary sinus). The coronary arteries were visualized and appeared patent proximally (coronary buttons re-attached to pulmonary autograft). There was narrowing at the suture line where the autograft was sewed into the native ascending aorta. At the narrowing, the ascending aorta was 2.4 cm in diameter. There did appear to be some turbulent flow at this narrowing. The aortic arch was not dilated. The great vessels had normal origins. The pulmonary veins appeared to drain normally to the left atrium (though the right-sided veins were not well-visualized). MEASUREMENTS: MEASUREMENTS Sinus of valsalva measurement into left cusp 5.2 cm, into right cusp 4.7 cm, into noncoronary cusp  4.2 cm Sinotubular junction 4.5 cm Ascending aorta at narrowing where autograft joined to native aorta 2.4 cm Aortic arch 3.2 cm Descending  thoracic aorta 2.6 cm IMPRESSION: 1. Status post Ross procedure with pulmonary valve and artery autograft in aortic position with re-implanted coronary buttons and homograft in the pulmonary position. There was mild autograft valve regurgitation and no significant stenosis. The homograft pulmonary valve was thickened with at least mild regurgitation and suspect a degree of stenosis. 2. There was dilation of the pulmonary autograft in the aortic position at the sinuses of valsalva, reaching 5.2 cm as measured into the left coronary cusp. There was also narrowing with some turbulent flow at the suture line where autograft was sewed into the native ascending aorta. Sinus of valsalva dilation does not appear to have significant progressed compared to prior MRA. Dalton Mclean Electronically Signed   By: Loralie Champagne M.D.   On: 08/19/2015 23:08     I have independently reviewed the above radiologic studies.  ECHO 03/2016: Study Conclusions Patient:    [name] [name], [name] [name] MR #:       [id number] Study Date: 03/12/2016 Gender:     M Age:        65 Height:     190.5 cm Weight:     104.1 kg BSA:        2.36 m^2 Pt. Status: Room:   SONOGRAPHER  Wyatt Mage, RDCS  PERFORMING   [facility], Outpatient  ATTENDING    Eileen Stanford  ORDERING     Eileen Stanford  REFERRING    Angelena Form R  cc:  ------------------------------------------------------------------- LV EF: 55% -   60%  ------------------------------------------------------------------- Indications:      Aortic valve disease (I35.9).  ------------------------------------------------------------------- History:   PMH:   Dyspnea.  Risk factors:  Thoracic aortic aneurysm. Hypertension. Dyslipidemia.  ------------------------------------------------------------------- Study  Conclusions  - Left ventricle: The cavity size was normal. Wall thickness was   normal. Systolic function was normal. The estimated ejection   fraction was in the range of 55% to 60%. Wall motion was normal;   there were no regional wall motion abnormalities. Left   ventricular diastolic function parameters were normal. - Ventricular septum: Septal motion showed paradox. - Aortic valve: A valve autograft (aortic, Ross procedure) was   present and functioning normally. There was mild regurgitation. - Mitral valve: There was mild regurgitation. - Left atrium: The atrium was mildly dilated. - Right ventricle: The cavity size was moderately dilated. Wall   thickness was normal. Systolic pressure was increased slightly   due to mild pulmonic valve homograft stenosis. - Right atrium: The atrium was mildly dilated. - Pulmonic valve: A pulmonic root homograft was present. The   findings are consistent with mild stenosis. Peak gradient (S): 22   mm Hg.  Impressions:  - Recommend CT angiography or MRI study for more accurate   measurement of the ascending aorta.  ------------------------------------------------------------------- Labs, prior tests, procedures, and surgery: Transthoracic echocardiography (12/10/2014).    The aortic valve showed mild regurgitation.  EF was 60%. Aortic valve: mean gradient of 2 mm Hg.  Valve surgery.     Aortic valve replacement. Pulmonic autograft. Pulmonic valve replacement with a homograft. Surgery.    Ross procedure.  ------------------------------------------------------------------- Study data:  Comparison was made to the study of 12/10/2014.  Study status:  Routine.  Procedure:  The patient reported no pain pre or post test. Transthoracic echocardiography. Image quality was adequate.  Study completion:  There were no complications. Transthoracic echocardiography.  M-mode, complete 2D, spectral Doppler, and color Doppler.  Birthdate:  Patient  birthdate: 31-Oct-1950.  Age:  Patient is  67 yr old.  Sex:  Gender: male. BMI: 28.7 kg/m^2.  Blood pressure:     122/82  Patient status: Outpatient.  Study date:  Study date: 03/12/2016. Study time: 03:49 PM.  Location:  [facility]  -------------------------------------------------------------------  ------------------------------------------------------------------- Left ventricle:  The cavity size was normal. Wall thickness was normal. Systolic function was normal. The estimated ejection fraction was in the range of 55% to 60%. Wall motion was normal; there were no regional wall motion abnormalities. The transmitral flow pattern was normal. The deceleration time of the early transmitral flow velocity was normal. The pulmonary vein flow pattern was normal. The tissue Doppler parameters were normal. Left ventricular diastolic function parameters were normal.  ------------------------------------------------------------------- Aortic valve:   Normal thickness leaflets. A valve autograft (aortic, Ross procedure) was present and functioning normally. Mobility was not restricted.  Doppler:  Transvalvular velocity was within the normal range. There was no stenosis. There was mild regurgitation.    VTI ratio of LVOT to aortic valve: 0.86. Peak velocity ratio of LVOT to aortic valve: 0.82. Mean velocity ratio of LVOT to aortic valve: 0.76.    Mean gradient (S): 2 mm Hg. Peak gradient (S): 4 mm Hg.  ------------------------------------------------------------------- Aorta:  The aorta was poorly visualized. Aortic root: The aortic root was normal in size. Ascending aorta: The ascending aorta was severely dilated. Lesion: There was a mild coarctation at the arch-descending aorta junction. Peak velocity across the coarctation is 220 cm/s.  ------------------------------------------------------------------- Mitral valve:   Structurally normal valve.   Mobility was not restricted.   Doppler:  Transvalvular velocity was within the normal range. There was no evidence for stenosis. There was mild regurgitation.    Peak gradient (D): 3 mm Hg.  ------------------------------------------------------------------- Left atrium:  The atrium was mildly dilated.  ------------------------------------------------------------------- Right ventricle:  The cavity size was moderately dilated. Wall thickness was normal. Systolic function was normal. Systolic pressure was increased slightly due to mild pulmonic valve homograft stenosis.  ------------------------------------------------------------------- Ventricular septum:   Septal motion showed paradox.  ------------------------------------------------------------------- Pulmonic valve:   A pulmonic root homograft was present.  Doppler:  The findings are consistent with mild stenosis.   There was no significant regurgitation. Peak gradient (S): 22 mm Hg.  ------------------------------------------------------------------- Tricuspid valve:   Structurally normal valve.    Doppler: Transvalvular velocity was within the normal range. There was mild regurgitation.  ------------------------------------------------------------------- Pulmonary artery:   The main pulmonary artery was normal-sized. Systolic pressure was within the normal range.  ------------------------------------------------------------------- Right atrium:  The atrium was mildly dilated.  ------------------------------------------------------------------- Pericardium:  There was no pericardial effusion.  ------------------------------------------------------------------- Systemic veins: Inferior vena cava: The vessel was normal in size.  ------------------------------------------------------------------- Post procedure conclusions Ascending Aorta:  - The aorta was poorly  visualized.  ------------------------------------------------------------------- Measurements   Left ventricle                             Value         Reference  LV ID, ED, PLAX chordal            (H)     57.7   mm     43 - 52  LV ID, ES, PLAX chordal            (H)     40.1   mm     23 - 38  LV fx shortening, PLAX chordal  31     %      >=29  LV PW thickness, ED                        10.4   mm     ---------  IVS/LV PW ratio, ED                        0.88          <=1.3  LV e&', lateral                             13.5   cm/s   ---------  LV E/e&', lateral                           5.9           ---------  LV e&', medial                              4.47   cm/s   ---------  LV E/e&', medial                            17.83         ---------  LV e&', average                             8.99   cm/s   ---------  LV E/e&', average                           8.87          ---------    Ventricular septum                         Value         Reference  IVS thickness, ED                          9.13   mm     ---------    LVOT                                       Value         Reference  LVOT peak velocity, S                      86.4   cm/s   ---------  LVOT mean velocity, S                      52.7   cm/s   ---------  LVOT VTI, S                                18.5   cm     ---------    Aortic valve  Value         Reference  Aortic valve peak velocity, S              105    cm/s   ---------  Aortic valve mean velocity, S              69     cm/s   ---------  Aortic valve VTI, S                        21.6   cm     ---------  Aortic mean gradient, S                    2      mm Hg  ---------  Aortic peak gradient, S                    4      mm Hg  ---------  VTI ratio, LVOT/AV                         0.86          ---------  Velocity ratio, peak, LVOT/AV              0.82          ---------  Velocity ratio, mean, LVOT/AV              0.76           ---------  Aortic regurg pressure half-time           581    ms     ---------    Aorta                                      Value         Reference  Aortic root ID, ED                         46     mm     ---------  Ascending aorta ID, A-P, S                 49     mm     ---------    Left atrium                                Value         Reference  LA ID, A-P, ES                             44.85  mm     ---------  LA ID/bsa, A-P                             1.9    cm/m^2 <=2.2  LA volume, S                               57     ml     ---------  LA volume/bsa, S  24.1   ml/m^2 ---------  LA volume, ES, 1-p A4C                     50     ml     ---------  LA volume/bsa, ES, 1-p A4C                 21.2   ml/m^2 ---------  LA volume, ES, 1-p A2C                     62     ml     ---------  LA volume/bsa, ES, 1-p A2C                 26.2   ml/m^2 ---------    Mitral valve                               Value         Reference  Mitral E-wave peak velocity                79.7   cm/s   ---------  Mitral A-wave peak velocity                55.9   cm/s   ---------  Mitral deceleration time                   218    ms     150 - 230  Mitral peak gradient, D                    3      mm Hg  ---------  Mitral E/A ratio, peak                     1.4           ---------    Pulmonary arteries                         Value         Reference  PA pressure, S, DP                         16     mm Hg  <=30    Tricuspid valve                            Value         Reference  Tricuspid regurg peak velocity             274.64 cm/s   ---------  Tricuspid peak RV-RA gradient              30     mm Hg  ---------  Tricuspid maximal regurg velocity,         274.64 cm/s   ---------  PISA    Systemic veins                             Value         Reference  Estimated CVP  8      mm Hg  ---------    Right ventricle                            Value          Reference  RV ID, minor axis, ED, A4C         (H)     47.38  mm     26 - 43  RV pressure, S, DP                 (H)     38     mm Hg  <=30  RV s&', lateral, S                          12.5   cm/s   ---------    Pulmonic valve                             Value         Reference  Pulmonic valve peak velocity, S            236    cm/s   ---------  Pulmonic peak gradient, S                  22     mm Hg  ---------  Legend: (L)  and  (H)  mark values outside specified reference range.  ------------------------------------------------------------------- Prepared and Electronically Authenticated by  Sanda Klein, MD 2017-08-10T17:21:16   Recent Lab Findings: Lab Results  Component Value Date   WBC 6.0 06/10/2017   HGB 15.0 06/10/2017   HCT 44.6 06/10/2017   PLT 265.0 06/10/2017   GLUCOSE 151 (H) 09/29/2017   CHOL 125 06/10/2017   TRIG 76.0 06/10/2017   HDL 34.70 (L) 06/10/2017   LDLDIRECT 69.8 05/31/2014   LDLCALC 75 06/10/2017   ALT 22 06/10/2017   AST 17 06/10/2017   NA 136 09/29/2017   K 4.2 09/29/2017   CL 105 09/29/2017   CREATININE 0.88 09/29/2017   BUN 13 09/29/2017   CO2 21 (L) 09/29/2017   TSH 2.91 06/10/2017   HGBA1C 6.2 06/10/2017      Assessment / Plan:   Dilated aortic root without  AI , 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  I would not recommend repeat surgery at this time without AI if develops further dilation with increasing AI consider redo root replacement and poss pulmonic homograft as needed. MRA appears stable, echocardiogram report 2002  notes  aortic root of 5.1 cm Will see back in 6  Months, with cardiac CT to obtain images of the aortic root and the takeoff of the right and left coronary buttons  discussed with him endocarditis precautions   and avoiding weight lifting [employer]   Patient also has incidental note of an elevated left diaphragm, this is not commented on an old chest x-ray reports, but as noted on  previous MRI scans. 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   Grace Isaac MD      [address] [place] [address] Office [phone]   Beeper [phone]  10/14/2017 3:16 PM

## 2017-10-22 ENCOUNTER — Other Ambulatory Visit (HOSPITAL_COMMUNITY): Payer: Self-pay | Admitting: *Deleted

## 2017-10-22 ENCOUNTER — Telehealth (HOSPITAL_COMMUNITY): Payer: Self-pay | Admitting: *Deleted

## 2017-10-22 MED ORDER — METOPROLOL SUCCINATE ER 100 MG PO TB24: ORAL_TABLET | ORAL | 3 refills | Status: DC

## 2017-10-22 NOTE — Telephone Encounter (Signed)
Patient emailed his 2 week BP readings per Dr. Aundra Dubin. Dr. Aundra Dubin reviewed and wants patient to increase Toprol-XL to 50 mg in the AM and 100 mg in the PM.  Patient is to continue recording BP readings for another 2 weeks.  I emailed patient back with his instructions.  Medication increased and sent to pharmacy.

## 2017-12-09 ENCOUNTER — Encounter: Payer: Self-pay | Admitting: Family Medicine

## 2017-12-09 ENCOUNTER — Other Ambulatory Visit: Payer: Self-pay

## 2017-12-09 ENCOUNTER — Ambulatory Visit: Payer: Medicare Other | Admitting: Family Medicine

## 2017-12-09 VITALS — BP 122/81 | HR 76 | Temp 97.9°F | Resp 16 | Ht 75.0 in | Wt 241.4 lb

## 2017-12-09 DIAGNOSIS — E669 Obesity, unspecified: Secondary | ICD-10-CM

## 2017-12-09 DIAGNOSIS — E66811 Obesity, class 1: Secondary | ICD-10-CM

## 2017-12-09 DIAGNOSIS — E785 Hyperlipidemia, unspecified: Secondary | ICD-10-CM

## 2017-12-09 DIAGNOSIS — Z125 Encounter for screening for malignant neoplasm of prostate: Secondary | ICD-10-CM | POA: Diagnosis not present

## 2017-12-09 DIAGNOSIS — I10 Essential (primary) hypertension: Secondary | ICD-10-CM

## 2017-12-09 LAB — CBC WITH DIFFERENTIAL/PLATELET
BASOS ABS: 0 10*3/uL (ref 0.0–0.1)
Basophils Relative: 0.4 % (ref 0.0–3.0)
EOS PCT: 2.2 % (ref 0.0–5.0)
Eosinophils Absolute: 0.2 10*3/uL (ref 0.0–0.7)
HEMATOCRIT: 44.6 % (ref 39.0–52.0)
Hemoglobin: 15.3 g/dL (ref 13.0–17.0)
LYMPHS PCT: 26.4 % (ref 12.0–46.0)
Lymphs Abs: 2.1 10*3/uL (ref 0.7–4.0)
MCHC: 34.2 g/dL (ref 30.0–36.0)
MCV: 89.6 fl (ref 78.0–100.0)
MONOS PCT: 11.5 % (ref 3.0–12.0)
Monocytes Absolute: 0.9 10*3/uL (ref 0.1–1.0)
NEUTROS ABS: 4.7 10*3/uL (ref 1.4–7.7)
Neutrophils Relative %: 59.5 % (ref 43.0–77.0)
PLATELETS: 280 10*3/uL (ref 150.0–400.0)
RBC: 4.97 Mil/uL (ref 4.22–5.81)
RDW: 13.3 % (ref 11.5–15.5)
WBC: 8 10*3/uL (ref 4.0–10.5)

## 2017-12-09 LAB — BASIC METABOLIC PANEL
BUN: 16 mg/dL (ref 6–23)
CALCIUM: 9.4 mg/dL (ref 8.4–10.5)
CO2: 28 meq/L (ref 19–32)
CREATININE: 0.88 mg/dL (ref 0.40–1.50)
Chloride: 105 mEq/L (ref 96–112)
GFR: 92.26 mL/min (ref >60.00)
Glucose, Bld: 117 mg/dL — ABNORMAL HIGH (ref 70–99)
Potassium: 4.4 mEq/L (ref 3.5–5.1)
SODIUM: 140 meq/L (ref 135–145)

## 2017-12-09 LAB — LIPID PANEL
CHOLESTEROL: 132 mg/dL (ref 0–200)
HDL: 35 mg/dL — ABNORMAL LOW (ref >39.00)
LDL Cholesterol: 75 mg/dL (ref 0–99)
NonHDL: 97.38
Total CHOL/HDL Ratio: 4
Triglycerides: 112 mg/dL (ref 0.0–149.0)
VLDL: 22.4 mg/dL (ref 0.0–40.0)

## 2017-12-09 LAB — HEPATIC FUNCTION PANEL
ALT: 18 U/L (ref 0–53)
AST: 16 U/L (ref 0–37)
Albumin: 4.4 g/dL (ref 3.5–5.2)
Alkaline Phosphatase: 56 U/L (ref 39–117)
BILIRUBIN DIRECT: 0.2 mg/dL (ref 0.0–0.3)
BILIRUBIN TOTAL: 1.2 mg/dL (ref 0.2–1.2)
TOTAL PROTEIN: 6.6 g/dL (ref 6.0–8.3)

## 2017-12-09 LAB — PSA, MEDICARE: PSA: 1.67 ng/ml (ref 0.10–4.00)

## 2017-12-09 NOTE — Progress Notes (Signed)
5/9 Labs Added

## 2017-12-09 NOTE — Progress Notes (Signed)
   Subjective:    Patient ID: [name] [name], male    DOB: 07/14/1951, 67 y.o.   MRN: [id number]  HPI HTN- chronic problem, on Metoprolol 50mg  QAM and 100mg  QPM, Micardis 80mg  w/ good control.  Denies CP, SOB- unless bending forward, HAs, visual changes, edema.  Hyperlipidemia- chronic problem, on Simvastatin 20mg  daily.  Denies abd pain, N/V.  Prostate cancer screen- previously following w/ Dr Risa Grill, asking for this to be checked since it's over a year.  Obesity- pt reports SOB when bending forward.  He is aware that he has put on weight around the middle.  Hx of hiatal hernia.  Wants to know how best to lose his belly.   Review of Systems For ROS see HPI     Objective:   Physical Exam  Constitutional: He is oriented to person, place, and time. He appears well-developed and well-nourished. No distress.  HENT:  Head: Normocephalic and atraumatic.  Eyes: Pupils are equal, round, and reactive to light. Conjunctivae and EOM are normal.  Neck: Normal range of motion. Neck supple. No thyromegaly present.  Cardiovascular: Normal rate, regular rhythm and intact distal pulses.  Murmur (loud mechanical murmur/click) heard. Pulmonary/Chest: Effort normal and breath sounds normal. No respiratory distress.  Abdominal: Soft. Bowel sounds are normal. He exhibits no distension.  Musculoskeletal: He exhibits no edema.  Lymphadenopathy:    He has no cervical adenopathy.  Neurological: He is alert and oriented to person, place, and time. No cranial nerve deficit.  Skin: Skin is warm and dry.  Psychiatric: He has a normal mood and affect. His behavior is normal.  Vitals reviewed.         Assessment & Plan:

## 2017-12-09 NOTE — Patient Instructions (Addendum)
Schedule your complete physical in 6 months We'll notify you of your lab results and make any changes if needed Continue to work on healthy diet and regular exercise- you can do it! Consider [employer] if you want a formal program Call and schedule an appt w/ [facility] at [phone] Call with any questions or concerns Have a great summer!!!

## 2017-12-09 NOTE — Assessment & Plan Note (Signed)
Chronic problem.  Tolerating statin w/o difficulty.  Check labs.  Adjust meds prn  

## 2017-12-09 NOTE — Assessment & Plan Note (Signed)
Ongoing issue.  Discussed weight loss plans- [employer], [employer], [employer].  Stressed that [employer] would be the best option for him.  Encouraged regular exercise.  Will follow.

## 2017-12-09 NOTE — Assessment & Plan Note (Signed)
Chronic problem.  Asymptomatic.  Check labs.  No anticipated med changes.

## 2017-12-10 ENCOUNTER — Other Ambulatory Visit (INDEPENDENT_AMBULATORY_CARE_PROVIDER_SITE_OTHER): Payer: Medicare Other

## 2017-12-10 DIAGNOSIS — R7309 Other abnormal glucose: Secondary | ICD-10-CM | POA: Diagnosis not present

## 2017-12-10 LAB — HEMOGLOBIN A1C: HEMOGLOBIN A1C: 6.4 % (ref 4.6–6.5)

## 2017-12-15 ENCOUNTER — Other Ambulatory Visit: Payer: Self-pay | Admitting: Family Medicine

## 2018-01-25 ENCOUNTER — Ambulatory Visit: Payer: Medicare Other | Admitting: Clinical

## 2018-01-25 DIAGNOSIS — F4321 Adjustment disorder with depressed mood: Secondary | ICD-10-CM

## 2018-02-17 ENCOUNTER — Ambulatory Visit: Payer: Medicare Other | Admitting: Clinical

## 2018-02-17 DIAGNOSIS — F4323 Adjustment disorder with mixed anxiety and depressed mood: Secondary | ICD-10-CM

## 2018-03-13 ENCOUNTER — Other Ambulatory Visit: Payer: Self-pay | Admitting: Family Medicine

## 2018-03-17 ENCOUNTER — Other Ambulatory Visit: Payer: Self-pay | Admitting: Cardiothoracic Surgery

## 2018-03-17 DIAGNOSIS — I712 Thoracic aortic aneurysm, without rupture, unspecified: Secondary | ICD-10-CM

## 2018-03-31 ENCOUNTER — Ambulatory Visit: Payer: Medicare Other | Admitting: Clinical

## 2018-03-31 DIAGNOSIS — F4323 Adjustment disorder with mixed anxiety and depressed mood: Secondary | ICD-10-CM | POA: Diagnosis not present

## 2018-04-13 DIAGNOSIS — H5203 Hypermetropia, bilateral: Secondary | ICD-10-CM | POA: Diagnosis not present

## 2018-04-13 DIAGNOSIS — H52203 Unspecified astigmatism, bilateral: Secondary | ICD-10-CM | POA: Diagnosis not present

## 2018-04-13 DIAGNOSIS — H524 Presbyopia: Secondary | ICD-10-CM | POA: Diagnosis not present

## 2018-04-17 ENCOUNTER — Other Ambulatory Visit: Payer: Self-pay | Admitting: Family Medicine

## 2018-04-28 ENCOUNTER — Ambulatory Visit: Payer: Medicare Other | Admitting: Cardiothoracic Surgery

## 2018-04-28 ENCOUNTER — Ambulatory Visit
Admission: RE | Admit: 2018-04-28 | Discharge: 2018-04-28 | Disposition: A | Discharge status: Home / Self Care | Payer: Medicare Other | Source: Ambulatory Visit | Attending: Cardiothoracic Surgery | Admitting: Cardiothoracic Surgery

## 2018-04-28 ENCOUNTER — Encounter: Payer: Self-pay | Admitting: Cardiothoracic Surgery

## 2018-04-28 VITALS — BP 120/71 | HR 64 | Resp 20 | Ht 75.0 in | Wt 245.0 lb

## 2018-04-28 DIAGNOSIS — I712 Thoracic aortic aneurysm, without rupture, unspecified: Secondary | ICD-10-CM

## 2018-04-28 DIAGNOSIS — I7121 Aneurysm of the ascending aorta, without rupture: Secondary | ICD-10-CM

## 2018-04-28 MED ORDER — IOPAMIDOL (ISOVUE-370) INJECTION 76%
75.0000 mL | Freq: Once | INTRAVENOUS | Status: AC | PRN
  Administered 2018-04-28: 75 mL via INTRAVENOUS

## 2018-04-28 NOTE — Progress Notes (Signed)
[address]       [place] [address]             [phone]                    Othmar F Chumley [facility] Medical Record #[id number] Date of Birth: 18-Jul-1951  Referring: [name], MD Primary Care: [name], MD  Chief Complaint:    Chief Complaint  Patient presents with  . Thoracic Aortic Aneurysm    6 month f/u with CTA Chest    History of Present Illness:    [name] [name] 67 y.o. male followed in the  office after Ross procedure done in [place]   for congenital aortic insufficiency  Case almost  25 years ago.  The patient is asymptomatic from a heart failure standpoint.  He denies angina, PND, dyspnea on exertion.  He remains active playing golf frequently.  His only complaint is increasing abdominal girth.   He has been noted to have dilatation of his aortic root at the sinuses of Valsalva following his Ross procedure. Reports from echocardiogram in 2002 measured aortic root at 5.1  cm,The patient's mid ascending aorta which is partially native aorta and partial prosthetic graft is not significantly dilated.  Review of cardiac MRIs back to 2015 showed little change in the homograft root.   Patient has elevated left hemidiaphragm noted back on previous scans.   pmh:  1. Aortic insufficiency: s/p Ross procedure in 1994 in [place]. Aortic insufficiency: Echo (4/11) with hypokinetic basal septum (likely post-surgical), EF 50%, mild LVH, s/p Ross procedure with native pulmonic valve in aortic position with mild aortic insufficiency and bioprosthetic pulmonic valve with no pulmonic insufficiency, mild [name], aortic upper normal in size. Echo (4/13) with EF 55%, mild LVH, mild AI, mild [name], mild RV dilation, bioprosthetic pulmonic valve with peak pressure 25 mmHg. Echo (3/14): Mild LV dilation, mild LVH, EF 60%, pulmonary valve in aortic position (s/p Ross) with mild AI and mildly dilated ascending aorta to 4.3 cm, mildly dilated RV with  normal systolic function, bioprosthetic PVR with mean gradient 23 mmHg. Echo (3/15) with EF 55-60%, mild LVH, mild AI with no AS, bioprosthetic pulmonic valve with peak gradient 31 mmHg, ascending aorta 4.4 cm. Cardiac MRI/MRA chest (5/15) with mild regurgitation of autograft aortic valve and mild regurgitation of homograft pulmonic valve, dilation of aortic root at sinuses of valsalva (4.8 cm), EF 6%, no LGE, mild RV dilation with normal RV systolic function. Echo (5/16) with EF 55-60%, mild autograft AI, mild [name], mildly decreased RV systolic function, biatrial enlargement, RVSP 35 mmHg, trivially increased gradient across bioprosthetic pulmonic valve (no progression).  2. Post-operative atrial fibrillation after heart surgery.  3. Normal left heart cath prior to 1994 heart surgery.  4. Left brachial plexopathy with left shoulder muscle atrophy. Uncertain etiology.  5. BPH 6. Hyperlipidemia.  7. Cervical spinal stenosis with radiculopathy.  8. 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  9. ETT-Sestamibi (3/14): No ischemia or infarction.  10. Carotid dopplers (6/14): mild disease only.      Current Activity/ Functional Status:  Patient is independent with mobility/ambulation, transfers, ADL's, IADL's.   Zubrod Score: At the time of surgery this patient's most appropriate activity status/level should be described as: [x]     0    Normal activity, no symptoms []     1  Restricted in physical strenuous activity but ambulatory, able to do out light work []     2    Ambulatory and capable of self care, unable to do work activities, up and about               >50 % of waking hours                              []     3    Only limited self care, in bed greater than 50% of waking hours []     4    Completely disabled, no self care, confined to bed or chair []     5    Moribund   Past Medical History:  Diagnosis Date    . Acute gastritis   . Aortic insufficiency    s/p aortic valve replacement in 1994  . Early satiety   . Esophageal stricture   . GERD (gastroesophageal reflux disease)   . Hiatal hernia   . Hyperlipidemia   . Neuropathy    left brachial plexus  . Pain, dental     Past Surgical History:  Procedure Laterality Date  . AORTIC VALVE REPLACEMENT  1994  . GANGLION CYST EXCISION  JAN 2002  . HIATAL HERNIA REPAIR  DEC 2002  . TONSILLECTOMY    . VASECTOMY  DEC 2002    Family History  Problem Relation Age of Onset  . Hypertension Unknown   . Prostate cancer Unknown   . Leukemia Brother   . Colon cancer Neg Hx   . Pancreatic cancer Neg Hx   . Rectal cancer Neg Hx   . Stomach cancer Neg Hx     Social History   Socioeconomic History  . Marital status: Married    Spouse name: Not on file  . Number of children: 3  . Years of education: Not on file  . Highest education level: Not on file  Occupational History  . Not on file  Social Needs  . Financial resource strain: Not on file  . Food insecurity:    Worry: Not on file    Inability: Not on file  . Transportation needs:    Medical: Not on file    Non-medical: Not on file  Tobacco Use  . Smoking status: Never Smoker  . Smokeless tobacco: Never Used    Social History   Tobacco Use  Smoking Status Never Smoker  Smokeless Tobacco Never Used    Social History   Substance and Sexual Activity  Alcohol Use No     Allergies  Allergen Reactions  . Penicillins Hives and Swelling  . Quinidine Other (See Comments)    Increase heart beat per patient    Current Outpatient Medications  Medication Sig Dispense Refill  . aspirin 81 MG chewable tablet Chew 81 mg by mouth daily.      . cetirizine (ZYRTEC) 10 MG tablet Take 1 tablet (10 mg total) by mouth daily. 30 tablet 11  . finasteride (PROSCAR) 5 MG tablet TAKE 1 TABLET(5 MG) BY MOUTH DAILY 90 tablet 0  . fluticasone (FLONASE) 50 MCG/ACT nasal spray Place 2 sprays  daily into both nostrils. 16 g 6  . metoprolol succinate (TOPROL-XL) 100 MG 24 hr tablet Take 50 mg in the AM and 100 mg in the PM 135 tablet 3  . simvastatin (ZOCOR) 20 MG tablet TAKE 1 TABLET BY MOUTH DAILY 30 tablet 11  . telmisartan (  MICARDIS) 80 MG tablet TAKE 1 TABLET(80 MG) BY MOUTH DAILY 90 tablet 0  . VIAGRA 100 MG tablet      No current facility-administered medications for this visit.       Review of Systems:  Review of Systems  Constitutional: Negative.   HENT: Negative.   Eyes: Negative.   Respiratory: Negative.   Cardiovascular: Negative.  Negative for orthopnea, claudication, leg swelling and PND.  Gastrointestinal: Negative.   Genitourinary: Negative.   Musculoskeletal: Negative.   Skin: Negative.   Neurological: Negative.   Endo/Heme/Allergies: Negative.   Psychiatric/Behavioral: Negative.       Immunizations: Flu up to date [ y ]; Pneumococcal up to date [ y ];   Physical Exam: BP 120/71   Pulse 64   Resp 20   Ht 6\' 3"  (1.905 m)   Wt 245 lb (111.1 kg)   SpO2 98% Comment: RA  BMI 30.62 kg/m   PHYSICAL EXAMINATION: General appearance: alert, cooperative and no distress Head: Normocephalic, without obvious abnormality, atraumatic Neck: no adenopathy, no carotid bruit, no JVD, supple, symmetrical, trachea midline and thyroid not enlarged, symmetric, no tenderness/mass/nodules Lymph nodes: Cervical, supraclavicular, and axillary nodes normal. Resp: clear to auscultation bilaterally Back: symmetric, no curvature. ROM normal. No CVA tenderness. Cardio: regular rate and rhythm, S1, S2 normal, no murmur, click, rub or gallop GI: soft, non-tender; bowel sounds normal; no masses,  no organomegaly Extremities: extremities normal, atraumatic, no cyanosis or edema and Homans sign is negative, no sign of DVT Neurologic: Grossly normal    Diagnostic Studies & Laboratory data:     Recent Radiology Findings:   Ct Angio Chest Aorta W/cm &/or Wo/cm  Result  Date: 04/28/2018 CLINICAL DATA:  Follow-up thoracic aortic aneurysm EXAM: CT ANGIOGRAPHY CHEST WITH [name] TECHNIQUE: Multidetector CT imaging of the chest was performed using the standard protocol during bolus administration of intravenous [name]. Multiplanar CT image reconstructions and MIPs were obtained to evaluate the vascular anatomy. [name]:  73mL ISOVUE-370 IOPAMIDOL (ISOVUE-370) INJECTION 76% Creatinine was obtained on site at [facility] at [address] Results: Creatinine 0.8 mg/dL. COMPARISON:  MRI 10/01/2017 FINDINGS: Cardiovascular: Again noted are postoperative changes from ascending aortic repair. Maximum diameter extending into the left coronary sinus region measures 6.3 cm, stable. Maximum diameter of the proximal arch 3.4 cm and the proximal descending thoracic aorta 3.3 cm. Scattered aortic calcifications and coronary artery calcifications. Mild cardiomegaly. Calcifications in the main pulmonary artery. Mediastinum/Nodes: Scattered calcified mediastinal lymph nodes. No mediastinal, hilar, or axillary adenopathy. Lungs/Pleura: Left base atelectasis or scarring. Otherwise no confluent opacities. No effusions. Upper Abdomen: Calcifications in the spleen compatible with old granulomatous disease. Musculoskeletal: Chest wall soft tissues are unremarkable. No acute bony abnormality. Review of the MIP images confirms the above findings. IMPRESSION: Status post ascending aortic repair. Stable aneurysmal dilatation of the aortic root measuring 6.3 cm extending into the left coronary sinus. Coronary artery disease, aortic atherosclerosis. Calcifications in the main pulmonary artery. Old granulomatous disease. Left base atelectasis or scarring. Electronically Signed   By: Rolm Baptise M.D.   On: 04/28/2018 11:23   I have independently reviewed the above radiology studies  and reviewed the findings with the patient. I have reviewed the films with the patient and compared them back to  2015.  It does appear aneurysmal dilatation of the aortic root is stable and extends to the left coronary button.  However the measurement of 6.3 was not made perpendicular to  flow.   [name] Angiogram Chest  W Wo [name]  Result Date: 09/09/2016 CLINICAL DATA:  Status post Harrington Challenger procedure with follow-up of dilated aortic root. EXAM: MRA CHEST WITH OR WITHOUT [name] TECHNIQUE: Preliminary sequences were obtained prior to administration of [name]. Angiographic images of the chest were obtained using MRA technique with intravenous [name]. [name]:  68mL MULTIHANCE GADOBENATE DIMEGLUMINE 529 MG/ML IV SOLN COMPARISON:  08/19/2015 FINDINGS: VASCULAR Aorta: Dilatation of the pulmonary autograft at the level of the sinuses of Valsalva appears grossly stable. Maximal diameter measuring into the left cusp is approximately 5.4 cm. Diameter measuring into the right cusp is approximately 5.1 cm. Diameter measuring into the non coronary cusp is approximately 5.0 cm. Anastomosis of autograft and native aorta appears stable and measures 2.4 cm. Ascending aorta measures 3.3 cm. Aortic arch measures 3 cm. Descending thoracic aorta measures 2.9 cm. No evidence of aortic dissection. Proximal great vessels show stable and normal patency. Heart: Heart size appears stable and normal. No pericardial fluid identified. Pulmonary Vasculature: No dilatation of pulmonary arteries. Stable appearance of central pulmonary veins. NON-VASCULAR Stable elevation of left hemidiaphragm. No lymphadenopathy or masses identified. No pleural fluid or pulmonary abnormalities identified. IMPRESSION: Stable dilatation of pulmonary autograft at the level of the sinuses of Valsalva. Stable relative narrowing of autograft anastomosis with the native aorta. No evidence of aortic dissection. Electronically Signed   By: Aletta Edouard M.D.   On: 09/09/2016 13:43    [name] [name]  08/19/2015  CLINICAL DATA:  S/p Ross procedure with  dilated aortic root. EXAM: CARDIAC MRI TECHNIQUE: The patient was scanned on a 1.5 Tesla GE magnet. A dedicated cardiac coil was used. FIESTA sequences were done for functional assessment. The patient received 25 cc of Multihance [name] for [name] angiography. [name]:  25 cc Multihance FINDINGS: The patient was s/p sternotomy. Limited images of lung fields showed no gross abnormalities. There was a pulmonary valve and artery autograft in the aortic position. The autograft valve was trileaflet with mild autograft valve insufficiency. There did not appear to be autograft valve stenosis. There was a homograft in the pulmonary position. The homograft valve was thickened with probably mild regurgitation. There was turbulent flow across the homograft valve suggestive of some degree of stenosis (this is better assessed by echo). The autograft in the aortic position had dilated sinuses of valsalva, especially the left coronary sinus (SOV measured 5.2 cm when measured into the left coronary sinus). The coronary arteries were visualized and appeared patent proximally (coronary buttons re-attached to pulmonary autograft). There was narrowing at the suture line where the autograft was sewed into the native ascending aorta. At the narrowing, the ascending aorta was 2.4 cm in diameter. There did appear to be some turbulent flow at this narrowing. The aortic arch was not dilated. The great vessels had normal origins. The pulmonary veins appeared to drain normally to the left atrium (though the right-sided veins were not well-visualized). MEASUREMENTS: MEASUREMENTS Sinus of valsalva measurement into left cusp 5.2 cm, into right cusp 4.7 cm, into noncoronary cusp 4.2 cm Sinotubular junction 4.5 cm Ascending aorta at narrowing where autograft joined to native aorta 2.4 cm Aortic arch 3.2 cm Descending thoracic aorta 2.6 cm IMPRESSION: 1. Status post Ross procedure with pulmonary valve and artery autograft in aortic position with  re-implanted coronary buttons and homograft in the pulmonary position. There was mild autograft valve regurgitation and no significant stenosis. The homograft pulmonary valve was thickened with at least mild regurgitation and suspect a degree of stenosis. 2.  There was dilation of the pulmonary autograft in the aortic position at the sinuses of valsalva, reaching 5.2 cm as measured into the left coronary cusp. There was also narrowing with some turbulent flow at the suture line where autograft was sewed into the native ascending aorta. Sinus of valsalva dilation does not appear to have significant progressed compared to prior MRA. Dalton Mclean Electronically Signed   By: Loralie Champagne M.D.   On: 08/19/2015 23:08     I have independently reviewed the above radiologic studies.  ECHO  10/11/2017: Echocardiography  Patient:    [name] [name], [name] [name] [name] #:       [id number] Study Date: 10/11/2017 Gender:     M Age:        47 Height:     190.5 cm Weight:     110.6 kg BSA:        2.44 m^2 Pt. Status: Room:   Elenore Paddy, M.D.  REFERRING    Loralie Champagne, M.D.  PERFORMING   [facility], Outpatient  SONOGRAPHER  [facility], RDCS  ATTENDING    Skeet Latch, MD  cc:  ------------------------------------------------------------------- LV EF: 55% -   60%  ------------------------------------------------------------------- Indications:      Valve Replacement (Z95.2).  ------------------------------------------------------------------- History:   PMH:  History of Ross Procedure (1994) now with Aortic Root Aneurysm.  Risk factors:  Hypertension. Dyslipidemia.  ------------------------------------------------------------------- Study Conclusions  - Left ventricle: The cavity size was normal. Systolic function was   normal. The estimated ejection fraction was in the range of 55%   to 60%. Wall motion was normal; there were no regional wall   motion abnormalities. Left  ventricular diastolic function   parameters were normal. - Aortic valve: A bioprosthesis was present. Transvalvular velocity   was within the normal range. There was no stenosis. There was   mild regurgitation. Valve area (VTI): 5.15 cm^2. Valve area   (Vmax): 5.22 cm^2. Valve area (Vmean): 4.79 cm^2. - Aorta: Aortic root dimension: 47 mm (ED). Ascending aortic   diameter: 50 mm (S). - Aortic root: The aortic root was mildly dilated. - Ascending aorta: The ascending aorta was moderately dilated. - Mitral valve: Transvalvular velocity was within the normal range.   There was no evidence for stenosis. There was mild regurgitation. - Right ventricle: The cavity size was mildly dilated. Wall   thickness was normal. Systolic function was normal. - Right atrium: The atrium was severely dilated. - Tricuspid valve: There was mild regurgitation. - Pulmonic valve: A bioprosthesis was present. Transvalvular   velocity was within the normal range. There was no evidence for   stenosis. There was mild regurgitation. Peak gradient (S): 20 mm   Hg.  Impressions:  - Compared with the echo 03/2017, the ascending aorta has increased   from 4.5 cm to 5.0 cm.  ------------------------------------------------------------------- Study data:  Comparison was made to the study of 03/17/2017.  Study status:  Routine.  Procedure:  Transthoracic echocardiography. Image quality was adequate.          Echocardiography.  M-mode, complete 2D, 3D, spectral Doppler, and color Doppler.  Birthdate: Patient birthdate: Feb 03, 1951.  Age:  Patient is 67 yr old.  Sex: Gender: male.    BMI: 30.5 kg/m^2.  Blood pressure:     140/88 Patient status:  Outpatient.  Study date:  Study date: 10/11/2017. Study time: 08:40 AM.  Location:  [facility]  -------------------------------------------------------------------  ------------------------------------------------------------------- Left ventricle:  The cavity size  was normal.  Systolic function was normal. The estimated ejection fraction was in the range of 55% to 60%. Wall motion was normal; there were no regional wall motion abnormalities. The transmitral flow pattern was normal. The deceleration time of the early transmitral flow velocity was normal. The pulmonary vein flow pattern was normal. The tissue Doppler parameters were normal. Left ventricular diastolic function parameters were normal.  ------------------------------------------------------------------- Aortic valve:   Normal thickness leaflets. A bioprosthesis was present. Mobility was not restricted.  Doppler:  Transvalvular velocity was within the normal range. There was no stenosis. There was mild regurgitation.    VTI ratio of LVOT to aortic valve: 0.78. Valve area (VTI): 5.15 cm^2. Indexed valve area (VTI): 2.11 cm^2/m^2. Peak velocity ratio of LVOT to aortic valve: 0.79. Valve area (Vmax): 5.22 cm^2. Indexed valve area (Vmax): 2.14 cm^2/m^2. Mean velocity ratio of LVOT to aortic valve: 0.73. Valve area (Vmean): 4.79 cm^2. Indexed valve area (Vmean): 1.96 cm^2/m^2. Mean gradient (S): 1 mm Hg. Peak gradient (S): 2 mm Hg.  ------------------------------------------------------------------- Aorta:  Aortic root: The aortic root was mildly dilated. Ascending aorta: The ascending aorta was moderately dilated.  ------------------------------------------------------------------- Mitral valve:   Structurally normal valve.   Mobility was not restricted.  Doppler:  Transvalvular velocity was within the normal range. There was no evidence for stenosis. There was mild regurgitation.    Peak gradient (D): 2 mm Hg.  ------------------------------------------------------------------- Left atrium:  The atrium was normal in size.  ------------------------------------------------------------------- Right ventricle:  The cavity size was mildly dilated. Wall thickness was normal. Systolic  function was normal.  ------------------------------------------------------------------- Pulmonic valve:   A bioprosthesis was present.  Doppler: Transvalvular velocity was within the normal range. There was no evidence for stenosis. There was mild regurgitation. Peak gradient (S): 20 mm Hg.  ------------------------------------------------------------------- Tricuspid valve:   Structurally normal valve.    Doppler: Transvalvular velocity was within the normal range. There was mild regurgitation.  ------------------------------------------------------------------- Pulmonary artery:   The main pulmonary artery was normal-sized. Systolic pressure was within the normal range.  ------------------------------------------------------------------- Right atrium:  The atrium was severely dilated.  ------------------------------------------------------------------- Pericardium:  There was no pericardial effusion.  ------------------------------------------------------------------- Systemic veins: Inferior vena cava: The vessel was normal in size.  ------------------------------------------------------------------- Measurements   Left ventricle                           Value          Reference  LV ID, ED, PLAX chordal                  47.1  mm       43 - 52  LV ID, ES, PLAX chordal                  31.8  mm       23 - 38  LV fx shortening, PLAX chordal           32    %        >=29  LV PW thickness, ED                      10.1  mm       ----------  IVS/LV PW ratio, ED                      1.06           <=1.3  Stroke volume, 2D  84    ml       ----------  Stroke volume/bsa, 2D                    34    ml/m^2   ----------  LV e&', lateral                           12.1  cm/s     ----------  LV E/e&', lateral                         6.3            ----------  LV e&', medial                            8.05  cm/s     ----------  LV E/e&', medial                           9.47           ----------  LV e&', average                           10.08 cm/s     ----------  LV E/e&', average                         7.56           ----------    Ventricular septum                       Value          Reference  IVS thickness, ED                        10.7  mm       ----------    LVOT                                     Value          Reference  LVOT ID, S                               29    mm       ----------  LVOT area                                6.61  cm^2     ----------  LVOT peak velocity, S                    58.4  cm/s     ----------  LVOT mean velocity, S                    35.9  cm/s     ----------  LVOT VTI, S                              12.7  cm       ----------  Aortic valve                             Value          Reference  Aortic valve peak velocity, S            73.9  cm/s     ----------  Aortic valve mean velocity, S            49.5  cm/s     ----------  Aortic valve VTI, S                      16.3  cm       ----------  Aortic mean gradient, S                  1     mm Hg    ----------  Aortic peak gradient, S                  2     mm Hg    ----------  VTI ratio, LVOT/AV                       0.78           ----------  Aortic valve area, VTI                   5.15  cm^2     ----------  Aortic valve area/bsa, VTI               2.11  cm^2/m^2 ----------  Velocity ratio, peak, LVOT/AV            0.79           ----------  Aortic valve area, peak velocity         5.22  cm^2     ----------  Aortic valve area/bsa, peak              2.14  cm^2/m^2 ----------  velocity  Velocity ratio, mean, LVOT/AV            0.73           ----------  Aortic valve area, mean velocity         4.79  cm^2     ----------  Aortic valve area/bsa, mean              1.96  cm^2/m^2 ----------  velocity  Aortic regurg pressure half-time         719   ms       ----------    Aorta                                    Value          Reference  Aortic root  ID, ED                       47    mm       ----------  Ascending aorta ID, A-P, S               50    mm       ----------    RVOT  Value          Reference  RVOT area                                5.73  cm^2     ----------    Left atrium                              Value          Reference  LA ID, A-P, ES                           50    mm       ----------  LA ID/bsa, A-P                           2.05  cm/m^2   <=2.2  LA volume, S                             68.9  ml       ----------  LA volume/bsa, S                         28.2  ml/m^2   ----------  LA volume, ES, 1-p A4C                   69.4  ml       ----------  LA volume/bsa, ES, 1-p A4C               28.4  ml/m^2   ----------  LA volume, ES, 1-p A2C                   67.3  ml       ----------  LA volume/bsa, ES, 1-p A2C               27.6  ml/m^2   ----------    Mitral valve                             Value          Reference  Mitral E-wave peak velocity              76.2  cm/s     ----------  Mitral A-wave peak velocity              57.7  cm/s     ----------  Mitral deceleration time                 225   ms       150 - 230  Mitral peak gradient, D                  2     mm Hg    ----------  Mitral E/A ratio, peak                   1.3            ----------    Tricuspid valve  Value          Reference  Tricuspid regurg peak velocity           253   cm/s     ----------  Tricuspid peak RV-RA gradient            26    mm Hg    ----------    Right atrium                             Value          Reference  RA ID, S-I, ES, A4C              (H)     68    mm       34 - 49  RA area, ES, A4C                 (H)     32.2  cm^2     8.3 - 19.5  RA volume, ES, A/L                       122   ml       ----------  RA volume/bsa, ES, A/L                   49.9  ml/m^2   ----------    Systemic veins                           Value          Reference  Estimated CVP                             3     mm Hg    ----------    Right ventricle                          Value          Reference  TAPSE                                    21.8  mm       ----------  RV pressure, S, DP                       29    mm Hg    <=30  RV s&', lateral, S                        10.4  cm/s     ----------    Pulmonic valve                           Value          Reference  Pulmonic valve peak velocity, S          222   cm/s     ----------  Pulmonic peak gradient, S                20    mm Hg    ----------  Legend: (L)  and  (H)  mark values outside specified reference  range.  ------------------------------------------------------------------- Prepared and Electronically Authenticated by  Skeet Latch, MD 2019-03-11T13:35:05   Study Conclusions Patient:    Marven, Veley [name] #:       [id number] Study Date: 03/12/2016 Gender:     M Age:        41 Height:     190.5 cm Weight:     104.1 kg BSA:        2.36 m^2 Pt. Status: Room:   SONOGRAPHER  Wyatt Mage, RDCS  PERFORMING   [facility]  ATTENDING    Eileen Stanford  ORDERING     Eileen Stanford  REFERRING    Angelena Form R  cc:  ------------------------------------------------------------------- LV EF: 55% -   60%  ------------------------------------------------------------------- Indications:      Aortic valve disease (I35.9).  ------------------------------------------------------------------- History:   PMH:   Dyspnea.  Risk factors:  Thoracic aortic aneurysm. Hypertension. Dyslipidemia.  ------------------------------------------------------------------- Study Conclusions  - Left ventricle: The cavity size was normal. Wall thickness was   normal. Systolic function was normal. The estimated ejection   fraction was in the range of 55% to 60%. Wall motion was normal;   there were no regional wall motion abnormalities. Left   ventricular diastolic function parameters were  normal. - Ventricular septum: Septal motion showed paradox. - Aortic valve: A valve autograft (aortic, Ross procedure) was   present and functioning normally. There was mild regurgitation. - Mitral valve: There was mild regurgitation. - Left atrium: The atrium was mildly dilated. - Right ventricle: The cavity size was moderately dilated. Wall   thickness was normal. Systolic pressure was increased slightly   due to mild pulmonic valve homograft stenosis. - Right atrium: The atrium was mildly dilated. - Pulmonic valve: A pulmonic root homograft was present. The   findings are consistent with mild stenosis. Peak gradient (S): 22   mm Hg.  Impressions:  - Recommend CT angiography or MRI study for more accurate   measurement of the ascending aorta.  ------------------------------------------------------------------- Labs, prior tests, procedures, and surgery: Transthoracic echocardiography (12/10/2014).    The aortic valve showed mild regurgitation.  EF was 60%. Aortic valve: mean gradient of 2 mm Hg.  Valve surgery.     Aortic valve replacement. Pulmonic autograft. Pulmonic valve replacement with a homograft. Surgery.    Ross procedure.  ------------------------------------------------------------------- Study data:  Comparison was made to the study of 12/10/2014.  Study status:  Routine.  Procedure:  The patient reported no pain pre or post test. Transthoracic echocardiography. Image quality was adequate.  Study completion:  There were no complications. Transthoracic echocardiography.  M-mode, complete 2D, spectral Doppler, and color Doppler.  Birthdate:  Patient birthdate: 1950-11-05.  Age:  Patient is 67 yr old.  Sex:  Gender: male. BMI: 28.7 kg/m^2.  Blood pressure:     122/82  Patient status: Outpatient.  Study date:  Study date: 03/12/2016. Study time: 03:49 PM.  Location:  [facility]  3  -------------------------------------------------------------------  ------------------------------------------------------------------- Left ventricle:  The cavity size was normal. Wall thickness was normal. Systolic function was normal. The estimated ejection fraction was in the range of 55% to 60%. Wall motion was normal; there were no regional wall motion abnormalities. The transmitral flow pattern was normal. The deceleration time of the early transmitral flow velocity was normal. The pulmonary vein flow pattern was normal. The tissue Doppler parameters were normal. Left ventricular diastolic function parameters were normal.  ------------------------------------------------------------------- Aortic valve:   Normal thickness leaflets. A valve autograft (aortic, Harrington Challenger  procedure) was present and functioning normally. Mobility was not restricted.  Doppler:  Transvalvular velocity was within the normal range. There was no stenosis. There was mild regurgitation.    VTI ratio of LVOT to aortic valve: 0.86. Peak velocity ratio of LVOT to aortic valve: 0.82. Mean velocity ratio of LVOT to aortic valve: 0.76.    Mean gradient (S): 2 mm Hg. Peak gradient (S): 4 mm Hg.  ------------------------------------------------------------------- Aorta:  The aorta was poorly visualized. Aortic root: The aortic root was normal in size. Ascending aorta: The ascending aorta was severely dilated. Lesion: There was a mild coarctation at the arch-descending aorta junction. Peak velocity across the coarctation is 220 cm/s.  ------------------------------------------------------------------- Mitral valve:   Structurally normal valve.   Mobility was not restricted.  Doppler:  Transvalvular velocity was within the normal range. There was no evidence for stenosis. There was mild regurgitation.    Peak gradient (D): 3 mm Hg.  ------------------------------------------------------------------- Left  atrium:  The atrium was mildly dilated.  ------------------------------------------------------------------- Right ventricle:  The cavity size was moderately dilated. Wall thickness was normal. Systolic function was normal. Systolic pressure was increased slightly due to mild pulmonic valve homograft stenosis.  ------------------------------------------------------------------- Ventricular septum:   Septal motion showed paradox.  ------------------------------------------------------------------- Pulmonic valve:   A pulmonic root homograft was present.  Doppler:  The findings are consistent with mild stenosis.   There was no significant regurgitation. Peak gradient (S): 22 mm Hg.  ------------------------------------------------------------------- Tricuspid valve:   Structurally normal valve.    Doppler: Transvalvular velocity was within the normal range. There was mild regurgitation.  ------------------------------------------------------------------- Pulmonary artery:   The main pulmonary artery was normal-sized. Systolic pressure was within the normal range.  ------------------------------------------------------------------- Right atrium:  The atrium was mildly dilated.  ------------------------------------------------------------------- Pericardium:  There was no pericardial effusion.  ------------------------------------------------------------------- Systemic veins: Inferior vena cava: The vessel was normal in size.  ------------------------------------------------------------------- Post procedure conclusions Ascending Aorta:  - The aorta was poorly visualized.  ------------------------------------------------------------------- Measurements   Left ventricle                             Value         Reference  LV ID, ED, PLAX chordal            (H)     57.7   mm     43 - 52  LV ID, ES, PLAX chordal            (H)     40.1   mm     23 - 38  LV fx  shortening, PLAX chordal             31     %      >=29  LV PW thickness, ED                        10.4   mm     ---------  IVS/LV PW ratio, ED                        0.88          <=1.3  LV e&', lateral                             13.5   cm/s   ---------  LV E/e&', lateral  5.9           ---------  LV e&', medial                              4.47   cm/s   ---------  LV E/e&', medial                            17.83         ---------  LV e&', average                             8.99   cm/s   ---------  LV E/e&', average                           8.87          ---------    Ventricular septum                         Value         Reference  IVS thickness, ED                          9.13   mm     ---------    LVOT                                       Value         Reference  LVOT peak velocity, S                      86.4   cm/s   ---------  LVOT mean velocity, S                      52.7   cm/s   ---------  LVOT VTI, S                                18.5   cm     ---------    Aortic valve                               Value         Reference  Aortic valve peak velocity, S              105    cm/s   ---------  Aortic valve mean velocity, S              69     cm/s   ---------  Aortic valve VTI, S                        21.6   cm     ---------  Aortic mean gradient, S                    2      mm Hg  ---------  Aortic peak gradient, S  4      mm Hg  ---------  VTI ratio, LVOT/AV                         0.86          ---------  Velocity ratio, peak, LVOT/AV              0.82          ---------  Velocity ratio, mean, LVOT/AV              0.76          ---------  Aortic regurg pressure half-time           581    ms     ---------    Aorta                                      Value         Reference  Aortic root ID, ED                         46     mm     ---------  Ascending aorta ID, A-P, S                 49     mm     ---------    Left atrium                                 Value         Reference  LA ID, A-P, ES                             44.85  mm     ---------  LA ID/bsa, A-P                             1.9    cm/m^2 <=2.2  LA volume, S                               57     ml     ---------  LA volume/bsa, S                           24.1   ml/m^2 ---------  LA volume, ES, 1-p A4C                     50     ml     ---------  LA volume/bsa, ES, 1-p A4C                 21.2   ml/m^2 ---------  LA volume, ES, 1-p A2C                     62     ml     ---------  LA volume/bsa, ES, 1-p A2C                 26.2   ml/m^2 ---------    Mitral valve  Value         Reference  Mitral E-wave peak velocity                79.7   cm/s   ---------  Mitral A-wave peak velocity                55.9   cm/s   ---------  Mitral deceleration time                   218    ms     150 - 230  Mitral peak gradient, D                    3      mm Hg  ---------  Mitral E/A ratio, peak                     1.4           ---------    Pulmonary arteries                         Value         Reference  PA pressure, S, DP                         16     mm Hg  <=30    Tricuspid valve                            Value         Reference  Tricuspid regurg peak velocity             274.64 cm/s   ---------  Tricuspid peak RV-RA gradient              30     mm Hg  ---------  Tricuspid maximal regurg velocity,         274.64 cm/s   ---------  PISA    Systemic veins                             Value         Reference  Estimated CVP                              8      mm Hg  ---------    Right ventricle                            Value         Reference  RV ID, minor axis, ED, A4C         (H)     47.38  mm     26 - 43  RV pressure, S, DP                 (H)     38     mm Hg  <=30  RV s&', lateral, S                          12.5   cm/s   ---------    Pulmonic valve  Value         Reference  Pulmonic valve peak  velocity, S            236    cm/s   ---------  Pulmonic peak gradient, S                  22     mm Hg  ---------  Legend: (L)  and  (H)  mark values outside specified reference range.  ------------------------------------------------------------------- Prepared and Electronically Authenticated by  Sanda Klein, MD 2017-08-10T17:21:16   Recent Lab Findings: Lab Results  Component Value Date   WBC 8.0 12/09/2017   HGB 15.3 12/09/2017   HCT 44.6 12/09/2017   PLT 280.0 12/09/2017   GLUCOSE 117 (H) 12/09/2017   CHOL 132 12/09/2017   TRIG 112.0 12/09/2017   HDL 35.00 (L) 12/09/2017   LDLDIRECT 69.8 05/31/2014   LDLCALC 75 12/09/2017   ALT 18 12/09/2017   AST 16 12/09/2017   NA 140 12/09/2017   K 4.4 12/09/2017   CL 105 12/09/2017   CREATININE 0.88 12/09/2017   BUN 16 12/09/2017   CO2 28 12/09/2017   TSH 2.91 06/10/2017   HGBA1C 6.4 12/10/2017      Assessment / Plan:   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    I would not recommend repeat surgery at this time without significant aortic insufficiency.  The homograft root appears to be unchanged in size on all the scans that are available, and does not appear to be progressing the enlarging.  Echo cardiogram reports back to 2002 noted and aortic root of 5.1 cm   I discussed with him endocarditis precautions   and avoiding weight lifting [employer]   Patient also has incidental note of an elevated left diaphragm, this is not commented on an old chest x-ray reports, but as noted on previous MRI scans. 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  Plan to see back in 8 months with a cardiac CT /for root morphology  Grace Isaac MD      [address] [place] [address] Office [phone]   Beeper [phone]  04/28/2018 12:05 PM

## 2018-05-16 ENCOUNTER — Other Ambulatory Visit: Payer: Self-pay | Admitting: Family Medicine

## 2018-05-17 ENCOUNTER — Other Ambulatory Visit: Payer: Self-pay | Admitting: Cardiology

## 2018-06-14 ENCOUNTER — Other Ambulatory Visit: Payer: Self-pay

## 2018-06-14 ENCOUNTER — Encounter: Payer: Self-pay | Admitting: Family Medicine

## 2018-06-14 ENCOUNTER — Ambulatory Visit (INDEPENDENT_AMBULATORY_CARE_PROVIDER_SITE_OTHER): Payer: Medicare Other | Admitting: Family Medicine

## 2018-06-14 VITALS — BP 122/74 | HR 78 | Temp 98.7°F | Resp 16 | Ht 75.0 in | Wt 244.5 lb

## 2018-06-14 DIAGNOSIS — N400 Enlarged prostate without lower urinary tract symptoms: Secondary | ICD-10-CM | POA: Diagnosis not present

## 2018-06-14 DIAGNOSIS — Z23 Encounter for immunization: Secondary | ICD-10-CM

## 2018-06-14 DIAGNOSIS — E785 Hyperlipidemia, unspecified: Secondary | ICD-10-CM | POA: Diagnosis not present

## 2018-06-14 DIAGNOSIS — I10 Essential (primary) hypertension: Secondary | ICD-10-CM

## 2018-06-14 DIAGNOSIS — Z0000 Encounter for general adult medical examination without abnormal findings: Secondary | ICD-10-CM

## 2018-06-14 LAB — HEPATIC FUNCTION PANEL
ALBUMIN: 4.4 g/dL (ref 3.5–5.2)
ALK PHOS: 59 U/L (ref 39–117)
ALT: 22 U/L (ref 0–53)
AST: 16 U/L (ref 0–37)
Bilirubin, Direct: 0.2 mg/dL (ref 0.0–0.3)
TOTAL PROTEIN: 6.7 g/dL (ref 6.0–8.3)
Total Bilirubin: 1.1 mg/dL (ref 0.2–1.2)

## 2018-06-14 LAB — CBC WITH DIFFERENTIAL/PLATELET
BASOS ABS: 0 10*3/uL (ref 0.0–0.1)
Basophils Relative: 0.3 % (ref 0.0–3.0)
EOS PCT: 1.1 % (ref 0.0–5.0)
Eosinophils Absolute: 0.1 10*3/uL (ref 0.0–0.7)
HCT: 43.8 % (ref 39.0–52.0)
HEMOGLOBIN: 15.1 g/dL (ref 13.0–17.0)
Lymphocytes Relative: 23.2 % (ref 12.0–46.0)
Lymphs Abs: 2.2 10*3/uL (ref 0.7–4.0)
MCHC: 34.6 g/dL (ref 30.0–36.0)
MCV: 88.9 fl (ref 78.0–100.0)
MONOS PCT: 9.1 % (ref 3.0–12.0)
Monocytes Absolute: 0.9 10*3/uL (ref 0.1–1.0)
NEUTROS PCT: 66.3 % (ref 43.0–77.0)
Neutro Abs: 6.4 10*3/uL (ref 1.4–7.7)
Platelets: 283 10*3/uL (ref 150.0–400.0)
RBC: 4.93 Mil/uL (ref 4.22–5.81)
RDW: 13.5 % (ref 11.5–15.5)
WBC: 9.6 10*3/uL (ref 4.0–10.5)

## 2018-06-14 LAB — LIPID PANEL
CHOL/HDL RATIO: 4
Cholesterol: 133 mg/dL (ref 0–200)
HDL: 36.4 mg/dL — ABNORMAL LOW (ref >39.00)
LDL Cholesterol: 70 mg/dL (ref 0–99)
NonHDL: 96.31
TRIGLYCERIDES: 130 mg/dL (ref 0.0–149.0)
VLDL: 26 mg/dL (ref 0.0–40.0)

## 2018-06-14 LAB — BASIC METABOLIC PANEL
BUN: 13 mg/dL (ref 6–23)
CO2: 25 mEq/L (ref 19–32)
Calcium: 9.4 mg/dL (ref 8.4–10.5)
Chloride: 104 mEq/L (ref 96–112)
Creatinine, Ser: 0.83 mg/dL (ref 0.40–1.50)
GFR: 98.55 mL/min (ref >60.00)
GLUCOSE: 107 mg/dL — AB (ref 70–99)
POTASSIUM: 4.3 meq/L (ref 3.5–5.1)
Sodium: 138 mEq/L (ref 135–145)

## 2018-06-14 LAB — TSH: TSH: 2.04 u[IU]/mL (ref 0.35–4.50)

## 2018-06-14 LAB — PSA, MEDICARE: PSA: 1.4 ng/ml (ref 0.10–4.00)

## 2018-06-14 MED ORDER — SIMVASTATIN 20 MG PO TABS: 20.0000 mg | ORAL_TABLET | Freq: Every day | ORAL | 1 refills | Status: DC

## 2018-06-14 MED ORDER — FINASTERIDE 5 MG PO TABS: ORAL_TABLET | ORAL | 1 refills | Status: DC

## 2018-06-14 NOTE — Addendum Note (Signed)
Addended by: Desmond Dike L on: 06/14/2018 10:50 AM   Modules accepted: Orders

## 2018-06-14 NOTE — Assessment & Plan Note (Signed)
Chronic problem.  Tolerating statin w/o difficulty.  Check labs.  Adjust meds prn  

## 2018-06-14 NOTE — Progress Notes (Signed)
   Subjective:    Patient ID: [name] [name], male    DOB: 07-May-1951, 67 y.o.   MRN: [id number]  HPI Here today for Welcome to Medicare CPE.  Risk Factors: HTN- chronic problem, on Metoprolol XR 100mg , Telmisartan 80mg  daily w/ good control Hyperlipidemia- chronic problem, on Simvastatin 20mg  daily Physical Activity: exercising regularly- walking, golf Fall Risk: low Depression: denies Hearing: normal to conversational tones, decreased to whispered voice ADL's: independent Cognitive: normal linear thought process, memory and attention intact Home Safety: safe at home, lives w/ wife Height, Weight, BMI, Visual Acuity: see vitals, vision corrected to 20/20 w/ glasses Counseling: UTD on colonoscopy, Urology, Tdap.  Due for flu and [employer] Will: pt has both and will bring a copy Labs Ordered: See A&P Care Plan: See A&P    Review of Systems Patient reports no vision/hearing changes, anorexia, fever ,adenopathy, persistant/recurrent hoarseness, swallowing issues, chest pain, palpitations, edema, persistant/recurrent cough, hemoptysis, dyspnea (rest,exertional, paroxysmal nocturnal), gastrointestinal  bleeding (melena, rectal bleeding), abdominal pain, excessive heart burn, GU symptoms (dysuria, hematuria, voiding/incontinence issues) syncope, focal weakness, memory loss, numbness & tingling, skin/hair/nail changes, depression, anxiety, abnormal bruising/bleeding, musculoskeletal symptoms/signs.     Objective:   Physical Exam General Appearance:    Alert, cooperative, no distress, appears stated age  Head:    Normocephalic, without obvious abnormality, atraumatic  Eyes:    PERRL, conjunctiva/corneas clear, EOM's intact, fundi    benign, both eyes       Ears:    Normal TM's and external ear canals, both ears  Nose:   Nares normal, septum midline, mucosa normal, no drainage   or sinus tenderness  Throat:   Lips, mucosa, and tongue normal; teeth and gums normal    Neck:   Supple, symmetrical, trachea midline, no adenopathy;       thyroid:  No enlargement/tenderness/nodules  Back:     Symmetric, no curvature, ROM normal, no CVA tenderness  Lungs:     Clear to auscultation bilaterally, respirations unlabored  Chest wall:    No tenderness or deformity  Heart:    Regular rate and rhythm, mechanical valve sounds  Abdomen:     Soft, non-tender, bowel sounds active all four quadrants,    no masses, no organomegaly  Genitalia:    Deferred to urology  Rectal:    Extremities:   Extremities normal, atraumatic, no cyanosis or edema  Pulses:   2+ and symmetric all extremities  Skin:   Skin color, texture, turgor normal, no rashes or lesions  Lymph nodes:   Cervical, supraclavicular, and axillary nodes normal  Neurologic:   CNII-XII intact. Normal strength, sensation and reflexes      throughout          Assessment & Plan:

## 2018-06-14 NOTE — Assessment & Plan Note (Signed)
Chronic problem.  Currently well controlled.  Asymptomatic.  Check labs.  No anticipated med changes.  Will follow. 

## 2018-06-14 NOTE — Assessment & Plan Note (Signed)
Check PSA and refer back to urology if elevated

## 2018-06-14 NOTE — Assessment & Plan Note (Signed)
Pt's PE unchanged from previous.  UTD on colonoscopy.  Flu and Prevnar given today.  Check labs.  Anticipatory guidance provided.

## 2018-06-14 NOTE — Patient Instructions (Signed)
Follow up in 6 months to recheck BP and cholesterol We'll notify you of your lab results and make any changes if needed Continue to work on healthy diet and regular exercise- you can do it! Call with any questions or concerns Happy Holidays!!! 

## 2018-06-15 ENCOUNTER — Other Ambulatory Visit (INDEPENDENT_AMBULATORY_CARE_PROVIDER_SITE_OTHER): Payer: Medicare Other

## 2018-06-15 DIAGNOSIS — R7309 Other abnormal glucose: Secondary | ICD-10-CM | POA: Diagnosis not present

## 2018-06-15 LAB — HEMOGLOBIN A1C: HEMOGLOBIN A1C: 6.5 % (ref 4.6–6.5)

## 2018-06-16 NOTE — Progress Notes (Signed)
Please call patient: I have reviewed his/her lab results. Please let pt know that his sugar test remains elevated, and is just at the diabetic range. Diet changes will be helpful: decreased sweets, processed carbs, sweetened beverages and increase veggies and grains. I recommend a follow up appt with Dr. Birdie Riddle in 3 months to recheck and discuss. Thanks.

## 2018-07-15 ENCOUNTER — Other Ambulatory Visit: Payer: Self-pay | Admitting: Family Medicine

## 2018-07-19 ENCOUNTER — Other Ambulatory Visit: Payer: Self-pay | Admitting: Family Medicine

## 2018-07-19 DIAGNOSIS — J329 Chronic sinusitis, unspecified: Principal | ICD-10-CM

## 2018-07-19 DIAGNOSIS — B9789 Other viral agents as the cause of diseases classified elsewhere: Secondary | ICD-10-CM

## 2018-07-22 ENCOUNTER — Other Ambulatory Visit: Payer: Self-pay | Admitting: General Practice

## 2018-07-22 MED ORDER — SIMVASTATIN 20 MG PO TABS: 20.0000 mg | ORAL_TABLET | Freq: Every day | ORAL | 1 refills | Status: DC

## 2018-07-22 MED ORDER — TELMISARTAN 80 MG PO TABS: ORAL_TABLET | ORAL | 1 refills | Status: DC

## 2018-07-25 ENCOUNTER — Ambulatory Visit: Payer: Medicare Other | Admitting: Family Medicine

## 2018-07-25 ENCOUNTER — Encounter: Payer: Self-pay | Admitting: Family Medicine

## 2018-07-25 ENCOUNTER — Other Ambulatory Visit: Payer: Self-pay

## 2018-07-25 VITALS — BP 141/86 | HR 89 | Temp 98.5°F | Resp 17 | Ht 75.0 in | Wt 244.4 lb

## 2018-07-25 DIAGNOSIS — R51 Headache: Secondary | ICD-10-CM | POA: Diagnosis not present

## 2018-07-25 DIAGNOSIS — R519 Headache, unspecified: Secondary | ICD-10-CM

## 2018-07-25 MED ORDER — PREDNISONE 10 MG PO TABS: ORAL_TABLET | ORAL | 0 refills | Status: DC

## 2018-07-25 NOTE — Patient Instructions (Signed)
Follow up as needed or as scheduled START the Prednisone as directed- take w/ food You can add Tylenol as needed for breakthrough pain (but avoid ibuprofen, naproxen, etc) IF your symptoms change, worsen, or fail to improve- please let me know ASAP so we can further work this up Call with any questions or concerns Hang in there! Happy Holidays!!!

## 2018-07-25 NOTE — Progress Notes (Signed)
   Subjective:    Patient ID: [name] [name], male    DOB: [birth year], 67 y.o.   MRN: [id number]  HPI HA- pt developed a bitemporal HA on Saturday AM while working on the computer.  Took Naproxen w/o relief.  sxs persisted yesterday.  No relief w/ ES Tylenol.  sxs continue to today.  Some blurry vision.  + photophobia- particularly yesterday.  No phonophobia.  No nausea.  No ear pain.  No pain w/ chewing or opening mouth.  'just a different headache than i've ever had before'.  No dizziness.  Currently taking Zyrtec.  Restarted Flonase a few weeks ago.   Review of Systems For ROS see HPI     Objective:   Physical Exam Vitals signs reviewed.  Constitutional:      General: He is not in acute distress.    Appearance: He is well-developed.  HENT:     Head: Normocephalic and atraumatic.     Comments: No TTP over temporal arteries bilaterally    Nose:     Right Sinus: Frontal sinus tenderness present. No maxillary sinus tenderness.     Left Sinus: Frontal sinus tenderness present. No maxillary sinus tenderness.  Eyes:     General: No visual field deficit.    Extraocular Movements:     Right eye: Normal extraocular motion and no nystagmus.     Left eye: Normal extraocular motion and no nystagmus.     Conjunctiva/sclera: Conjunctivae normal.     Pupils: Pupils are equal, round, and reactive to light.     Comments: R upper eyelid is overhanging- unchanged  Neck:     Musculoskeletal: Normal range of motion and neck supple.  Pulmonary:     Effort: Pulmonary effort is normal.  Lymphadenopathy:     Cervical: No cervical adenopathy.  Skin:    General: Skin is warm and dry.  Neurological:     Mental Status: He is alert and oriented to person, place, and time.     Cranial Nerves: No cranial nerve deficit or dysarthria.     Sensory: No sensory deficit.     Coordination: Coordination normal.     Gait: Gait normal.     Comments: Known L sided brachial plexus palsy otherwise WNL    Psychiatric:        Speech: Speech normal.        Behavior: Behavior normal.        Thought Content: Thought content normal.           Assessment & Plan:  Frontal HA- new.  Pt reports this is unlike his usual tension HAs.  Started while on computer- may be related to eye strain.  No TTP over temporal arteries bilaterally- making temporal arteritis unlikely.  No TIA or CVA sxs.  No neuro deficits associated w/ increased ICP.  No photophobia in office.  Pt is in no distress- very calm and comfortable today w/ exception of palpation of frontal sinuses.  Start prednisone taper.  Reviewed supportive care and red flags that should prompt return.  Pt expressed understanding and is in agreement w/ plan.

## 2018-07-26 ENCOUNTER — Other Ambulatory Visit: Payer: Self-pay | Admitting: General Practice

## 2018-07-26 MED ORDER — FINASTERIDE 5 MG PO TABS: ORAL_TABLET | ORAL | 1 refills | Status: DC

## 2018-09-12 ENCOUNTER — Telehealth: Payer: Self-pay

## 2018-09-12 NOTE — Telephone Encounter (Signed)
Copied from [facility] [id number]. Topic: General - Other >> Sep 12, 2018  3:52 PM Windy Kalata wrote: Reason for CRM: Patient is calling to let Dr. Birdie Riddle know that since his last visit in December his headaches are the same if not worse and would like to know what Dr. Birdie Riddle would like to do going forward, please advise  Best call back is [phone]

## 2018-09-13 ENCOUNTER — Ambulatory Visit: Payer: Medicare Other | Admitting: Family Medicine

## 2018-09-13 ENCOUNTER — Encounter: Payer: Self-pay | Admitting: Family Medicine

## 2018-09-13 ENCOUNTER — Other Ambulatory Visit: Payer: Self-pay

## 2018-09-13 VITALS — BP 123/81 | HR 56 | Temp 98.0°F | Resp 16 | Ht 75.0 in | Wt 240.5 lb

## 2018-09-13 DIAGNOSIS — R51 Headache: Secondary | ICD-10-CM | POA: Diagnosis not present

## 2018-09-13 DIAGNOSIS — R519 Headache, unspecified: Secondary | ICD-10-CM

## 2018-09-13 DIAGNOSIS — G4452 New daily persistent headache (NDPH): Secondary | ICD-10-CM | POA: Diagnosis not present

## 2018-09-13 LAB — BASIC METABOLIC PANEL
BUN: 14 mg/dL (ref 6–23)
CO2: 26 mEq/L (ref 19–32)
CREATININE: 0.72 mg/dL (ref 0.40–1.50)
Calcium: 9 mg/dL (ref 8.4–10.5)
Chloride: 106 mEq/L (ref 96–112)
GFR: 109.17 mL/min (ref >60.00)
Glucose, Bld: 83 mg/dL (ref 70–99)
Potassium: 4.6 mEq/L (ref 3.5–5.1)
Sodium: 138 mEq/L (ref 135–145)

## 2018-09-13 LAB — SEDIMENTATION RATE: SED RATE: 11 mm/h (ref 0–20)

## 2018-09-13 MED ORDER — TIZANIDINE HCL 4 MG PO TABS: 4.0000 mg | ORAL_TABLET | Freq: Three times a day (TID) | ORAL | 0 refills | Status: DC

## 2018-09-13 NOTE — Telephone Encounter (Signed)
called pt and LMOVM to inform of PCP recommendations.   [place] for [facility] to Discuss results / PCP recommendations / Schedule patient.

## 2018-09-13 NOTE — Patient Instructions (Signed)
Follow up as needed or as scheduled START the Tizanidine nightly to improve TMJ pain and prevent clenching Consider a nightly bite guard to prevent grinding your teeth We'll call you with your MRI appt Continue the Naproxen as needed but try not to use regularly Call with any questions or concerns Hang in there!

## 2018-09-13 NOTE — Telephone Encounter (Signed)
Pt needs to schedule an appt for re-evaluation since it has been 7 weeks.  If he has a neurologist, he could also call them regarding headaches.

## 2018-09-13 NOTE — Progress Notes (Signed)
   Subjective:    Patient ID: GODRIC LAVELL, male    DOB: 02-26-1951, 68 y.o.   MRN: [id number]  HPI HAs- pt reports when he is busy, he doesn't notice it.  When not busy, will have 'constant' bitemporal HA.  Using Flonase and Zyrtec regularly- no sinus sxs.  Cigarette smoke will 'irritate' HA but not start it.  HAs will impact vision.  No TTP over temples bilaterally.  Pain w/ clenching teeth.  Some relief w/ Naproxen.  No N/V.  No dizziness.  No new or different medications.  No relief w/ prednisone taper or tylenol.  HAs started acutely in December and have not abated.  Hx of TMJ.   Review of Systems For ROS see HPI     Objective:   Physical Exam Vitals signs reviewed.  Constitutional:      General: He is not in acute distress.    Appearance: He is well-developed.  HENT:     Head: Normocephalic and atraumatic.     Comments: No TTP over sinuses or temporal arteries + TTP over TMJ joints bilaterally Eyes:     Conjunctiva/sclera: Conjunctivae normal.     Pupils: Pupils are equal, round, and reactive to light.  Neck:     Musculoskeletal: Normal range of motion and neck supple.  Cardiovascular:     Rate and Rhythm: Normal rate and regular rhythm.     Heart sounds: Normal heart sounds.  Pulmonary:     Effort: Pulmonary effort is normal. No respiratory distress.     Breath sounds: Normal breath sounds. No wheezing or rales.  Lymphadenopathy:     Cervical: No cervical adenopathy.  Skin:    General: Skin is warm and dry.  Neurological:     Mental Status: He is alert and oriented to person, place, and time.     Cranial Nerves: No cranial nerve deficit, dysarthria or facial asymmetry.  Psychiatric:        Mood and Affect: Mood normal.        Speech: Speech normal.        Behavior: Behavior normal.        Thought Content: Thought content normal.           Assessment & Plan:  Daily persistent HAs- new.  Pt reported sudden onset in December and these have continued daily.   No relief w/ Prednisone.  Some relief w/ Naproxen.  No relief w/ Tylenol.  No TTP over temporal arteries so unlikely to be temporal arteritis, however, will check ESR.  + TTP over TMJ joints bilaterally- has hx of TMJ pain.  Will start muscle relaxer for pt to use at night so he can stop clenching- which may be part of the problem.  Given that he has persistent HAs, even overnight when lying down, will get MRI to assess.  Reviewed supportive care and red flags that should prompt return.  Pt expressed understanding and is in agreement w/ plan.

## 2018-09-15 ENCOUNTER — Encounter: Payer: Self-pay | Admitting: Family Medicine

## 2018-09-16 ENCOUNTER — Ambulatory Visit
Admission: RE | Admit: 2018-09-16 | Discharge: 2018-09-16 | Disposition: A | Discharge status: Home / Self Care | Payer: Medicare Other | Source: Ambulatory Visit | Attending: Family Medicine | Admitting: Family Medicine

## 2018-09-16 DIAGNOSIS — G4452 New daily persistent headache (NDPH): Secondary | ICD-10-CM

## 2018-09-16 MED ORDER — GADOBENATE DIMEGLUMINE 529 MG/ML IV SOLN
20.0000 mL | Freq: Once | INTRAVENOUS | Status: AC | PRN
  Administered 2018-09-16: 20 mL via INTRAVENOUS

## 2018-09-19 ENCOUNTER — Other Ambulatory Visit: Payer: Self-pay | Admitting: Family Medicine

## 2018-09-19 DIAGNOSIS — R519 Headache, unspecified: Secondary | ICD-10-CM

## 2018-09-19 DIAGNOSIS — R51 Headache: Principal | ICD-10-CM

## 2018-09-27 ENCOUNTER — Ambulatory Visit: Payer: Self-pay | Admitting: Neurology

## 2018-10-09 ENCOUNTER — Other Ambulatory Visit: Payer: Self-pay | Admitting: Family Medicine

## 2018-10-18 ENCOUNTER — Other Ambulatory Visit: Payer: Self-pay

## 2018-10-18 ENCOUNTER — Encounter: Payer: Self-pay | Admitting: Neurology

## 2018-10-18 ENCOUNTER — Ambulatory Visit: Payer: Medicare Other | Admitting: Neurology

## 2018-10-18 DIAGNOSIS — G4489 Other headache syndrome: Secondary | ICD-10-CM

## 2018-10-18 HISTORY — DX: Other headache syndrome: G44.89

## 2018-10-18 NOTE — Progress Notes (Signed)
Reason for visit: Headache  Referring physician: Dr. Inda [name] is a 68 y.o. male  History of present illness:  [name] [name] is a 68 year old right-handed white male with a history of headaches that began in December 2019.  [name] [name] had spontaneous onset of [name] headache, there were no medication adjustments or dental procedures around [name] time of [name] headache onset.  [name] [name] had severe bitemporal pain with some tenderness with palpation of [name] temporal areas that was constant in nature.  [name] [name] took over-[name]-counter medication such as naproxen and Tylenol without benefit, he was given a trial on tizanidine which did not help.  [name] [name] was seen by his primary care physician who obtained MRI of [name] brain which was unremarkable.  [name] [name] has had gradual spontaneous improvement of his headache, he now has no headache unless he is chewing or eating which may bring on some mild bitemporal discomfort.  [name] [name] denies any restriction of opening [name] mouth.  He was placed on a course of prednisone around [name] time that [name] headache was severe without any benefit.  [name] [name] denies any pre-existing personal history of headache and no family history of headache.  He reported no numbness or weakness of [name] face, arms, legs.  He has had a prior upper brachial plexopathy that was painless and onset affecting [name] left arm in 2006, he was seen by Dr. Tedra Coupe previously for this.  He has permanent proximal weakness of [name] left arm.  He also reports a several year history of brief intermittent episodes of vertigo that may last anywhere from 10 seconds to 60 seconds and then go away.  This has been present for 10 to 12 years.  He also reports rare events of decreased vision in 1 eye, he cannot member which, this may occur independently of [name] episodes of vertigo.  [name] [name] has undergone MRI evaluation of [name] brain that was unremarkable, he is sent to this office for an  evaluation.  A sedimentation rate recently checked was normal at 11.  Past Medical History:  Diagnosis Date   Acute gastritis    Aortic insufficiency    s/p aortic valve replacement in 1994   Early satiety    Esophageal stricture    GERD (gastroesophageal reflux disease)    Hiatal hernia    Hyperlipidemia    Neuropathy    left brachial plexus   Pain, dental     Past Surgical History:  Procedure Laterality Date   AORTIC VALVE REPLACEMENT  1994   GANGLION CYST EXCISION  JAN 2002   HIATAL HERNIA REPAIR  DEC 2002   TONSILLECTOMY     VASECTOMY  DEC 2002    Family History  Problem Relation Age of Onset   Lung cancer Father        hx of smoking    Hypertension Other    Prostate cancer Other    Leukemia Brother        Passed at age of 58   Colon cancer Neg Hx    Pancreatic cancer Neg Hx    Rectal cancer Neg Hx    Stomach cancer Neg Hx     Social history:  reports that he has never smoked. He has never used smokeless tobacco. He reports that he does not drink alcohol or use drugs.  Medications:  Prior to Admission medications   Medication Sig Start Date End Date Taking? Authorizing Provider  aspirin 81 MG chewable tablet  Chew 81 mg by mouth daily.     Yes [provider]  cetirizine (ZYRTEC) 10 MG tablet TAKE 1 TABLET(10 MG) BY MOUTH DAILY 05/17/18  Yes [name], MD  finasteride (PROSCAR) 5 MG tablet TAKE 1 TABLET(5 MG) BY MOUTH DAILY 07/26/18  Yes [name], MD  fluticasone [facility]) 50 MCG/ACT nasal spray SHAKE LIQUID AND USE 2 SPRAYS IN EACH NOSTRIL DAILY 07/20/18  Yes [name], MD  metoprolol succinate (TOPROL-XL) 100 MG 24 hr tablet Take 50 mg in [name] AM and 100 mg in [name] PM 10/22/17  Yes [name], MD  simvastatin (ZOCOR) 20 MG tablet Take 1 tablet (20 mg total) by mouth daily. 07/22/18  Yes [name], MD  telmisartan (MICARDIS) 80 MG tablet TAKE 1 TABLET(80 MG) BY MOUTH DAILY 10/10/18  Yes  [name], MD  VIAGRA 100 MG tablet  09/29/11  Yes [provider]      Allergies  Allergen Reactions   Penicillins Hives and Swelling   Quinidine Other (See Comments)    Increase heart beat per [name]    ROS:  Out of a complete 14 system review of symptoms, [name] [name] complains only of [name] following symptoms, and all other reviewed systems are negative.  Difficulty swallowing Episodic loss of vision Headache Vertigo  Blood pressure 137/90, pulse 64, height 6\' 3"  (1.905 m), weight 239 lb 5 oz (108.6 kg).  Physical Exam  General: [name] [name] is alert and cooperative at [name] time of [name] examination.  Eyes: Pupils are equal, round, and reactive to light. Discs are flat bilaterally.  Neck: [name] neck is supple, no carotid bruits are noted.  Respiratory: [name] respiratory examination is clear.  Cardiovascular: [name] cardiovascular examination reveals a regular rate and rhythm, no obvious murmurs or rubs are noted.  A valvular click involving [name] aortic area was noted.  Skin: Extremities are without significant edema.  Neurologic Exam  Mental status: [name] [name] is alert and oriented x 3 at [name] time of [name] examination. [name] [name] has apparent normal recent and remote memory, with an apparently normal attention span and concentration ability.  Cranial nerves: Facial symmetry is present. There is good sensation of [name] face to pinprick and soft touch bilaterally. [name] strength of [name] facial muscles and [name] muscles to head turning and shoulder shrug are normal bilaterally. Speech is well enunciated, no aphasia or dysarthria is noted. Extraocular movements are full. Visual fields are full. [name] tongue is midline, and [name] [name] has symmetric elevation of [name] soft palate. No obvious hearing deficits are noted.  Motor: [name] motor testing reveals 5 over 5 strength of all 4 extremities, with exception that there is 3/5 strength with [name] left biceps, 2/5 strength with [name]  left deltoid muscle, 4/5 strength with [name] triceps muscle on [name] left. Good symmetric motor tone is noted throughout.  Sensory: Sensory testing is intact to pinprick, soft touch, vibration sensation, and position sense on all 4 extremities. No evidence of extinction is noted.  Coordination: Cerebellar testing reveals good finger-nose-finger and heel-to-shin bilaterally.  Gait and station: Gait is normal. Tandem gait is normal. Romberg is negative. No drift is seen.  Reflexes: Deep tendon reflexes are symmetric and normal bilaterally. Toes are downgoing bilaterally.   MRI brain 09/17/18:  IMPRESSION: 1. No acute intracranial abnormality or cause of headaches identified. 2. Unchanged small focus of chronic hemorrhage in [name] left occipital lobe. 3. Mild-to-moderate cerebral white matter disease, unchanged and nonspecific though may  reflect chronic small vessel ischemia or Migraines.  * MRI scan images were reviewed online. I agree with [name] written report.    Assessment/Plan:  1.  Bitemporal headache, likely tension headache  2.  Brief episodes of vertigo, probable vestibulopathy  3.  History of painless onset left upper brachial plexopathy 2006  [name] [name] has had improvement in [name] headache spontaneously, his headaches previously did not respond to prednisone which effectively excludes [name] diagnosis of temporal arteritis.  [name] [name] likely does have a form of muscle tension headache affecting [name] temporalis muscles.  [name] etiology of onset is not clear.  [name] [name] does not wish to go on medications for headache currently, he will contact me if this changes.  We will consider [name] use of nortriptyline or gabapentin.  He will follow-up here if needed.  Jill Alexanders MD 10/18/2018 9:31 AM  [facility] [address] [place], [place] [address]  Phone [phone] Fax [phone]

## 2018-10-25 ENCOUNTER — Other Ambulatory Visit (HOSPITAL_COMMUNITY): Payer: Self-pay

## 2018-10-25 MED ORDER — METOPROLOL SUCCINATE ER 100 MG PO TB24: ORAL_TABLET | ORAL | 0 refills | Status: DC

## 2018-11-16 ENCOUNTER — Other Ambulatory Visit: Payer: Self-pay | Admitting: *Deleted

## 2018-11-16 DIAGNOSIS — I7121 Aneurysm of the ascending aorta, without rupture: Secondary | ICD-10-CM

## 2018-11-16 DIAGNOSIS — I712 Thoracic aortic aneurysm, without rupture: Secondary | ICD-10-CM

## 2018-12-15 ENCOUNTER — Ambulatory Visit (INDEPENDENT_AMBULATORY_CARE_PROVIDER_SITE_OTHER): Payer: Medicare Other | Admitting: Family Medicine

## 2018-12-15 ENCOUNTER — Encounter: Payer: Self-pay | Admitting: Family Medicine

## 2018-12-15 ENCOUNTER — Ambulatory Visit: Payer: Self-pay | Admitting: Family Medicine

## 2018-12-15 ENCOUNTER — Other Ambulatory Visit: Payer: Self-pay

## 2018-12-15 VITALS — BP 114/79 | Wt 234.0 lb

## 2018-12-15 DIAGNOSIS — E785 Hyperlipidemia, unspecified: Secondary | ICD-10-CM

## 2018-12-15 DIAGNOSIS — I10 Essential (primary) hypertension: Secondary | ICD-10-CM

## 2018-12-15 NOTE — Progress Notes (Signed)
   Virtual Visit via Video   I connected with patient on 12/15/18 at  9:20 AM EDT by a video enabled telemedicine application and verified that I am speaking with the correct person using two identifiers.  Location patient: Home Location provider: Acupuncturist, Office Persons participating in the virtual visit: Patient, Provider, [place] (Jess B)  I discussed the limitations of evaluation and management by telemedicine and the availability of in person appointments. The patient expressed understanding and agreed to proceed.  Subjective:   HPI:   HTN- chronic problem, on Metoprolol 50mg  qAM and 100mg  qPM and Telmisartan 80mg  daily.  No CP, SOB, HAs, visual changes, edema.  Continues to exercise regularly.  Hyperlipidemia- chronic problem.  On Simvastatin 20mg  daily.  Denies abd pain, N/V.  ROS:   See pertinent positives and negatives per HPI.  Patient Active Problem List   Diagnosis Date Noted  . Hyperlipidemia 08/08/2008    Priority: High  . Essential hypertension 08/08/2008    Priority: High  . Headache syndrome 10/18/2018  . Laryngopharyngeal reflux (LPR) 08/10/2016  . BPH (benign prostatic hyperplasia) 06/03/2015  . H/O Ross procedure 01/31/2015  . Thoracic aortic aneurysm ([place]) 12/11/2013  . Ganglion cyst 06/01/2013  . Brachial plexus neuropathy 10/07/2011  . Cervical disc disease 10/07/2011  . General medical examination 03/11/2011  . Carpopedal spasm 02/25/2011  . Arthritis of hand 02/25/2011  . CARPAL TUNNEL SYNDROME, BILATERAL 06/10/2009  . AORTIC VALVE REPLACEMENT, HX OF 06/10/2009  . ESOPHAGEAL STRICTURE 03/29/2003  . GERD 03/29/2003  . HIATAL HERNIA 10/21/2000    Social History   Tobacco Use  . Smoking status: Never Smoker  . Smokeless tobacco: Never Used  Substance Use Topics  . Alcohol use: No    Current Outpatient Medications:  .  aspirin 81 MG chewable tablet, Chew 81 mg by mouth daily.  , Disp: , Rfl:  .  cetirizine (ZYRTEC) 10 MG tablet,  TAKE 1 TABLET(10 MG) BY MOUTH DAILY, Disp: 90 tablet, Rfl: 3 .  finasteride (PROSCAR) 5 MG tablet, TAKE 1 TABLET(5 MG) BY MOUTH DAILY, Disp: 90 tablet, Rfl: 1 .  fluticasone (FLONASE) 50 MCG/ACT nasal spray, SHAKE LIQUID AND USE 2 SPRAYS IN EACH NOSTRIL DAILY, Disp: 16 g, Rfl: 6 .  metoprolol succinate (TOPROL-XL) 100 MG 24 hr tablet, Take 50 mg in the AM and 100 mg in the PM, Disp: 135 tablet, Rfl: 0 .  simvastatin (ZOCOR) 20 MG tablet, Take 1 tablet (20 mg total) by mouth daily., Disp: 90 tablet, Rfl: 1 .  telmisartan (MICARDIS) 80 MG tablet, TAKE 1 TABLET(80 MG) BY MOUTH DAILY, Disp: 90 tablet, Rfl: 1 .  VIAGRA 100 MG tablet, , Disp: , Rfl:   Allergies  Allergen Reactions  . Penicillins Hives and Swelling  . Quinidine Other (See Comments)    Increase heart beat per patient    Objective:   There were no vitals taken for this visit. AAOx3, NAD NCAT, EOMI No obvious CN deficits Coloring WNL Pt is able to speak clearly, coherently without shortness of breath or increased work of breathing.  Thought process is linear.  Mood is appropriate.   Assessment and Plan:   HTN- chronic problem, currently asymptomatic.  Pt will come for labs next week.  No anticipated med changes.  Will follow.  Hyperlipidemia- chronic problem.  Tolerating statin w/o difficulty.  Applauded healthy diet and regular exercise.  Check labs.  Adjust meds prn.   Annye Asa, MD 12/15/2018

## 2018-12-15 NOTE — Progress Notes (Signed)
I have discussed the procedure for the virtual visit with the patient who has given consent to proceed with assessment and treatment.   Brannon Levene L Alize Borrayo, CMA     

## 2018-12-16 ENCOUNTER — Telehealth: Payer: Self-pay | Admitting: Family Medicine

## 2018-12-16 NOTE — Telephone Encounter (Signed)
LM asking pt to call back and scheduled his cpe after 06/14/18 w/ Dr. Birdie Riddle.

## 2018-12-22 ENCOUNTER — Other Ambulatory Visit (INDEPENDENT_AMBULATORY_CARE_PROVIDER_SITE_OTHER): Payer: Medicare Other

## 2018-12-22 ENCOUNTER — Other Ambulatory Visit: Payer: Self-pay

## 2018-12-22 DIAGNOSIS — E785 Hyperlipidemia, unspecified: Secondary | ICD-10-CM | POA: Diagnosis not present

## 2018-12-22 DIAGNOSIS — I10 Essential (primary) hypertension: Secondary | ICD-10-CM

## 2018-12-22 LAB — CBC WITH DIFFERENTIAL/PLATELET
Basophils Absolute: 0.1 10*3/uL (ref 0.0–0.1)
Basophils Relative: 1.2 % (ref 0.0–3.0)
Eosinophils Absolute: 0.2 10*3/uL (ref 0.0–0.7)
Eosinophils Relative: 2.1 % (ref 0.0–5.0)
HCT: 43.3 % (ref 39.0–52.0)
Hemoglobin: 14.7 g/dL (ref 13.0–17.0)
Lymphocytes Relative: 27.7 % (ref 12.0–46.0)
Lymphs Abs: 2.3 10*3/uL (ref 0.7–4.0)
MCHC: 34 g/dL (ref 30.0–36.0)
MCV: 90.7 fl (ref 78.0–100.0)
Monocytes Absolute: 1 10*3/uL (ref 0.1–1.0)
Monocytes Relative: 12.2 % — ABNORMAL HIGH (ref 3.0–12.0)
Neutro Abs: 4.7 10*3/uL (ref 1.4–7.7)
Neutrophils Relative %: 56.8 % (ref 43.0–77.0)
Platelets: 253 10*3/uL (ref 150.0–400.0)
RBC: 4.77 Mil/uL (ref 4.22–5.81)
RDW: 13.4 % (ref 11.5–15.5)
WBC: 8.3 10*3/uL (ref 4.0–10.5)

## 2018-12-22 LAB — LIPID PANEL
Cholesterol: 118 mg/dL (ref 0–200)
HDL: 39.9 mg/dL (ref >39.00)
LDL Cholesterol: 60 mg/dL (ref 0–99)
NonHDL: 78.47
Total CHOL/HDL Ratio: 3
Triglycerides: 92 mg/dL (ref 0.0–149.0)
VLDL: 18.4 mg/dL (ref 0.0–40.0)

## 2018-12-22 LAB — BASIC METABOLIC PANEL
BUN: 12 mg/dL (ref 6–23)
CO2: 25 mEq/L (ref 19–32)
Calcium: 8.8 mg/dL (ref 8.4–10.5)
Chloride: 104 mEq/L (ref 96–112)
Creatinine, Ser: 0.81 mg/dL (ref 0.40–1.50)
GFR: 95.21 mL/min (ref >60.00)
Glucose, Bld: 121 mg/dL — ABNORMAL HIGH (ref 70–99)
Potassium: 4.7 mEq/L (ref 3.5–5.1)
Sodium: 137 mEq/L (ref 135–145)

## 2018-12-22 LAB — HEPATIC FUNCTION PANEL
ALT: 22 U/L (ref 0–53)
AST: 20 U/L (ref 0–37)
Albumin: 4 g/dL (ref 3.5–5.2)
Alkaline Phosphatase: 56 U/L (ref 39–117)
Bilirubin, Direct: 0.2 mg/dL (ref 0.0–0.3)
Total Bilirubin: 1.1 mg/dL (ref 0.2–1.2)
Total Protein: 6.3 g/dL (ref 6.0–8.3)

## 2018-12-22 LAB — TSH: TSH: 2.44 u[IU]/mL (ref 0.35–4.50)

## 2018-12-22 NOTE — Addendum Note (Signed)
Addended by: [name] on: 12/22/2018 08:41 AM   Modules accepted: Orders

## 2018-12-23 ENCOUNTER — Other Ambulatory Visit (INDEPENDENT_AMBULATORY_CARE_PROVIDER_SITE_OTHER): Payer: Medicare Other

## 2018-12-23 DIAGNOSIS — R7309 Other abnormal glucose: Secondary | ICD-10-CM | POA: Diagnosis not present

## 2018-12-23 LAB — HEMOGLOBIN A1C: Hgb A1c MFr Bld: 6.7 % — ABNORMAL HIGH (ref 4.6–6.5)

## 2019-01-05 ENCOUNTER — Other Ambulatory Visit: Payer: Self-pay | Admitting: *Deleted

## 2019-01-05 DIAGNOSIS — I712 Thoracic aortic aneurysm, without rupture, unspecified: Secondary | ICD-10-CM

## 2019-01-11 ENCOUNTER — Other Ambulatory Visit: Payer: Self-pay | Admitting: Family Medicine

## 2019-01-20 ENCOUNTER — Other Ambulatory Visit (HOSPITAL_COMMUNITY): Payer: Self-pay | Admitting: Cardiology

## 2019-01-26 ENCOUNTER — Ambulatory Visit: Payer: Self-pay | Admitting: Cardiothoracic Surgery

## 2019-01-26 ENCOUNTER — Ambulatory Visit
Admission: RE | Admit: 2019-01-26 | Discharge: 2019-01-26 | Disposition: A | Discharge status: Home / Self Care | Payer: Medicare Other | Source: Ambulatory Visit | Attending: Cardiothoracic Surgery | Admitting: Cardiothoracic Surgery

## 2019-01-26 DIAGNOSIS — I712 Thoracic aortic aneurysm, without rupture, unspecified: Secondary | ICD-10-CM

## 2019-01-26 MED ORDER — GADOBENATE DIMEGLUMINE 529 MG/ML IV SOLN
20.0000 mL | Freq: Once | INTRAVENOUS | Status: AC | PRN
  Administered 2019-01-26: 20 mL via INTRAVENOUS

## 2019-01-30 ENCOUNTER — Other Ambulatory Visit: Payer: Self-pay

## 2019-01-31 ENCOUNTER — Ambulatory Visit: Payer: Medicare Other | Admitting: Cardiothoracic Surgery

## 2019-01-31 ENCOUNTER — Encounter: Payer: Self-pay | Admitting: Cardiothoracic Surgery

## 2019-01-31 VITALS — BP 128/68 | HR 67 | Temp 97.7°F | Resp 16 | Ht 75.0 in | Wt 230.0 lb

## 2019-01-31 DIAGNOSIS — I712 Thoracic aortic aneurysm, without rupture: Secondary | ICD-10-CM

## 2019-01-31 DIAGNOSIS — Z954 Presence of other heart-valve replacement: Secondary | ICD-10-CM | POA: Diagnosis not present

## 2019-01-31 DIAGNOSIS — I7121 Aneurysm of the ascending aorta, without rupture: Secondary | ICD-10-CM

## 2019-01-31 NOTE — Progress Notes (Signed)
[address]       [place] [id number]             [phone]                    [name] [name] [facility] Medical Record #[id number] Date of Birth: 02-07-1951  Referring: [name], MD Primary Care: [name], MD  Chief Complaint:    Chief Complaint  Patient presents with  . Routine Post Op    8 month f/u with MRA CHEST 01/26/19  for f/u AORTIC ROOT morphology...Marland KitchenCARDIAC CT originally orderered...s/p Harrington Challenger Procedure    History of Present Illness:    [name] [name] 68 y.o. male followed in the  office after Harrington Challenger procedure done in [place]   for congenital aortic insufficiency  Case almost  26 years ago.  The patient is asymptomatic from a heart failure standpoint.  He denies angina, PND, dyspnea on exertion.  He remains active playing golf frequently.  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 Reports from echocardiogram in 2002 measured aortic root at 5.1  cm,The patient's mid ascending aorta which is partially native aorta and partial prosthetic graft is not significantly dilated.  Review of cardiac MRIs back to 2015 showed little change in the homograft root.   Patient has elevated left hemidiaphragm noted back on previous scans.   pmh:  1. Aortic insufficiency: s/p Ross procedure in 1994 in [place]. Aortic insufficiency: Echo (4/11) with hypokinetic basal septum (likely post-surgical), EF 50%, mild LVH, s/p Ross procedure with native pulmonic valve in aortic position with mild aortic insufficiency and bioprosthetic pulmonic valve with no pulmonic insufficiency, mild MR, aortic upper normal in size. Echo (4/13) with EF 55%, mild LVH, mild AI, mild MR, mild RV dilation, bioprosthetic pulmonic valve with peak pressure 25 mmHg. Echo (3/14): Mild LV dilation, mild LVH, EF  60%, pulmonary valve in aortic position (s/p Ross) with mild AI and mildly dilated ascending aorta to 4.3 cm, mildly dilated RV with normal systolic function, bioprosthetic PVR with mean gradient 23 mmHg. Echo (3/15) with EF 55-60%, mild LVH, mild AI with no AS, bioprosthetic pulmonic valve with peak gradient 31 mmHg, ascending aorta 4.4 cm. Cardiac MRI/MRA chest (5/15) with mild regurgitation of autograft aortic valve and mild regurgitation of homograft pulmonic valve, dilation of aortic root at sinuses of valsalva (4.8 cm), EF 6%, no LGE, mild RV dilation with normal RV systolic function. Echo (5/16) with EF 55-60%, mild autograft AI, mild MR, mildly decreased RV systolic function, biatrial enlargement, RVSP 35 mmHg, trivially increased gradient across bioprosthetic pulmonic valve (no progression).  2. Post-operative atrial fibrillation after heart surgery.  3. Normal left heart cath prior to 1994 heart surgery.  4. Left brachial plexopathy with left shoulder muscle atrophy. Uncertain etiology.  5. BPH 6. Hyperlipidemia.  7. Cervical spinal stenosis with radiculopathy.  8. 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  9. ETT-Sestamibi (3/14): No ischemia or infarction.  10. Carotid dopplers (6/14): mild disease only.      Current Activity/ Functional Status:  Patient is independent with mobility/ambulation, transfers, ADL's, IADL's.   Zubrod Score: At the time of surgery this patient's most appropriate activity status/level  should be described as: [x]     0    Normal activity, no symptoms []     1    Restricted in physical strenuous activity but ambulatory, able to do out light work []     2    Ambulatory and capable of self care, unable to do work activities, up and about               >50 % of waking hours                              []     3    Only limited self care, in bed greater than 50% of waking hours []      4    Completely disabled, no self care, confined to bed or chair []     5    Moribund   Past Medical History:  Diagnosis Date  . Acute gastritis   . Aortic insufficiency    s/p aortic valve replacement in 1994  . Early satiety   . Esophageal stricture   . GERD (gastroesophageal reflux disease)   . Headache syndrome 10/18/2018  . Hiatal hernia   . Hyperlipidemia   . Neuropathy    left brachial plexus  . Pain, dental     Past Surgical History:  Procedure Laterality Date  . AORTIC VALVE REPLACEMENT  1994  . GANGLION CYST EXCISION  JAN 2002  . HIATAL HERNIA REPAIR  DEC 2002  . TONSILLECTOMY    . VASECTOMY  DEC 2002    Family History  Problem Relation Age of Onset  . Lung cancer Father        hx of smoking   . Hypertension Other   . Prostate cancer Other   . Leukemia Brother        Passed at age of 94  . Colon cancer Neg Hx   . Pancreatic cancer Neg Hx   . Rectal cancer Neg Hx   . Stomach cancer Neg Hx     Social History   Socioeconomic History  . Marital status: Married    Spouse name: Not on file  . Number of children: 3  . Years of education: Not on file  . Highest education level: Not on file  Occupational History  . Not on file  Social Needs  . Financial resource strain: Not on file  . Food insecurity:    Worry: Not on file    Inability: Not on file  . Transportation needs:    Medical: Not on file    Non-medical: Not on file  Tobacco Use  . Smoking status: Never Smoker  . Smokeless tobacco: Never Used    Social History   Tobacco Use  Smoking Status Never Smoker  Smokeless Tobacco Never Used    Social History   Substance and Sexual Activity  Alcohol Use No     Allergies  Allergen Reactions  . Penicillins Hives and Swelling  . Quinidine Other (See Comments)    Increase heart beat per patient  . Quinolones     Current Outpatient Medications  Medication Sig Dispense Refill  . aspirin 81 MG chewable tablet Chew 81 mg by mouth daily.       . cetirizine (ZYRTEC) 10 MG tablet TAKE 1 TABLET(10 MG) BY MOUTH DAILY 90 tablet 3  . finasteride (PROSCAR) 5 MG tablet TAKE 1 TABLET(5 MG) BY MOUTH DAILY 90 tablet 1  . fluticasone (FLONASE) 50  MCG/ACT nasal spray SHAKE LIQUID AND USE 2 SPRAYS IN EACH NOSTRIL DAILY 16 g 6  . metoprolol succinate (TOPROL-XL) 100 MG 24 hr tablet TAKE 1/2 TABLET BY MOUTH EVERY MORNING AND 1 TABLET BY MOUTH EVERY EVENING(APPOINTMENT NEEDED FOR REFILLS) 135 tablet 0  . simvastatin (ZOCOR) 20 MG tablet TAKE 1 TABLET(20 MG) BY MOUTH DAILY 90 tablet 1  . telmisartan (MICARDIS) 80 MG tablet TAKE 1 TABLET(80 MG) BY MOUTH DAILY 90 tablet 1  . VIAGRA 100 MG tablet      No current facility-administered medications for this visit.       Review of Systems:  Review of Systems  Constitutional: Negative.   HENT: Negative.   Eyes: Negative.   Respiratory: Negative.   Cardiovascular: Negative.   Gastrointestinal: Negative.   Genitourinary: Negative.   Musculoskeletal: Negative.   Skin: Negative.   Neurological: Negative.   Endo/Heme/Allergies: Negative.   Psychiatric/Behavioral: Negative.     Immunizations: Flu up to date [ y ]; Pneumococcal up to date [ y ];   Physical Exam: BP 128/68 (BP Location: Right Arm, Patient Position: Sitting, Cuff Size: Normal)   Pulse 67   Temp 97.7 F (36.5 C) Comment: THERMAL  Resp 16   Ht 6\' 3"  (1.905 m)   Wt 230 lb (104.3 kg)   SpO2 97% Comment: ON RA  BMI 28.75 kg/m   PHYSICAL EXAMINATION: General appearance: alert, cooperative and no distress Head: Normocephalic, without obvious abnormality, atraumatic Neck: no adenopathy, no carotid bruit, no JVD, supple, symmetrical, trachea midline and thyroid not enlarged, symmetric, no tenderness/mass/nodules Resp: clear to auscultation bilaterally Back: symmetric, no curvature. ROM normal. No CVA tenderness. Cardio: regular rate and rhythm and diastolic murmur: mid diastolic 2/6, decrescendo at lower left sternal border GI:  soft, non-tender; bowel sounds normal; no masses,  no organomegaly Extremities: extremities normal, atraumatic, no cyanosis or edema and Homans sign is negative, no sign of DVT patient does have superficial varicosities involving the left lower extremity just below the knee Neurologic: Grossly normal    Diagnostic Studies & Laboratory data:     Recent Radiology Findings:   Mr Angiogram Chest W Wo Contrast  Result Date: 01/27/2019 CLINICAL DATA:  Thoracic aortic aneurysm without rupture, follow-up. Ross procedure AVR (pulmonary valve autograft) 1994. EXAM: MRA CHEST WITH OR WITHOUT CONTRAST TECHNIQUE: Angiographic images of the chest were obtained using MRA technique with and without intravenous contrast. CONTRAST:  74mL MULTIHANCE GADOBENATE DIMEGLUMINE 529 MG/ML IV SOLN Creatinine was obtained on site at [facility] at [address] Results: Creatinine 0.9 mg/dL.  GFR 91. COMPARISON:  CT 04/28/2018 and previous FINDINGS: VASCULAR Aorta: Transverse diameters as follows: 6 cm sinuses of Valsalva (previously 6.4) 4.6 cm proximal ascending (stable by my measurement) 4 cm distal ascending/proximal arch (previously 4.1 by my measurement) 3.5 cm distal arch/proximal descending 3.1 cm distal descending No dissection or stenosis. No significant atheromatous irregularity. Classic 3 vessel brachiocephalic arterial origin anatomy. Visualized proximal abdominal aorta unremarkable. Heart: Normal size.  No pericardial effusion. Pulmonary Arteries: Postop change in the proximal main pulmonary artery as before. Central branches unremarkable. Limited evaluation of segmental and subsegmental branches. Other: None NON-VASCULAR Spinal cord: Negative limited evaluation Brachial plexus: Incomplete evaluation Muscles and tendons: Negative Bones: Sternotomy wires. Joints: Negative limited evaluation IMPRESSION: 1. No acute findings. 2. Stable 6 cm aortic root dilatation post Ross procedure. 3. 4-4.6 cm ascending  thoracic aortic aneurysm, stable. Recommend continued imaging surveillance. Electronically Signed   By: Eden Emms.D.  On: 01/27/2019 07:53   I have independently reviewed the above radiology studies  and reviewed the findings with the patient. I have reviewed the films with the patient and compared them back to 2015.  It does appear aneurysmal dilatation of the aortic root is stable and extends to the left coronary button.     Mr Angiogram Chest W Wo Contrast  Result Date: 09/09/2016 CLINICAL DATA:  Status post Harrington Challenger procedure with follow-up of dilated aortic root. EXAM: MRA CHEST WITH OR WITHOUT CONTRAST TECHNIQUE: Preliminary sequences were obtained prior to administration of contrast. Angiographic images of the chest were obtained using MRA technique with intravenous contrast. CONTRAST:  20mL MULTIHANCE GADOBENATE DIMEGLUMINE 529 MG/ML IV SOLN COMPARISON:  08/19/2015 FINDINGS: VASCULAR Aorta: Dilatation of the pulmonary autograft at the level of the sinuses of Valsalva appears grossly stable. Maximal diameter measuring into the left cusp is approximately 5.4 cm. Diameter measuring into the right cusp is approximately 5.1 cm. Diameter measuring into the non coronary cusp is approximately 5.0 cm. Anastomosis of autograft and native aorta appears stable and measures 2.4 cm. Ascending aorta measures 3.3 cm. Aortic arch measures 3 cm. Descending thoracic aorta measures 2.9 cm. No evidence of aortic dissection. Proximal great vessels show stable and normal patency. Heart: Heart size appears stable and normal. No pericardial fluid identified. Pulmonary Vasculature: No dilatation of pulmonary arteries. Stable appearance of central pulmonary veins. NON-VASCULAR Stable elevation of left hemidiaphragm. No lymphadenopathy or masses identified. No pleural fluid or pulmonary abnormalities identified. IMPRESSION: Stable dilatation of pulmonary autograft at the level of the sinuses of Valsalva. Stable relative  narrowing of autograft anastomosis with the native aorta. No evidence of aortic dissection. Electronically Signed   By: Aletta Edouard M.D.   On: 09/09/2016 13:43    Mr Angiogram Chest W Wo Contrast  08/19/2015  CLINICAL DATA:  S/p Ross procedure with dilated aortic root. EXAM: CARDIAC MRI TECHNIQUE: The patient was scanned on a 1.5 Tesla GE magnet. A dedicated cardiac coil was used. FIESTA sequences were done for functional assessment. The patient received 25 cc of Multihance contrast for MR angiography. CONTRAST:  25 cc Multihance FINDINGS: The patient was s/p sternotomy. Limited images of lung fields showed no gross abnormalities. There was a pulmonary valve and artery autograft in the aortic position. The autograft valve was trileaflet with mild autograft valve insufficiency. There did not appear to be autograft valve stenosis. There was a homograft in the pulmonary position. The homograft valve was thickened with probably mild regurgitation. There was turbulent flow across the homograft valve suggestive of some degree of stenosis (this is better assessed by echo). The autograft in the aortic position had dilated sinuses of valsalva, especially the left coronary sinus (SOV measured 5.2 cm when measured into the left coronary sinus). The coronary arteries were visualized and appeared patent proximally (coronary buttons re-attached to pulmonary autograft). There was narrowing at the suture line where the autograft was sewed into the native ascending aorta. At the narrowing, the ascending aorta was 2.4 cm in diameter. There did appear to be some turbulent flow at this narrowing. The aortic arch was not dilated. The great vessels had normal origins. The pulmonary veins appeared to drain normally to the left atrium (though the right-sided veins were not well-visualized). MEASUREMENTS: MEASUREMENTS Sinus of valsalva measurement into left cusp 5.2 cm, into right cusp 4.7 cm, into noncoronary cusp 4.2 cm Sinotubular  junction 4.5 cm Ascending aorta at narrowing where autograft joined to native aorta 2.4 cm Aortic  arch 3.2 cm Descending thoracic aorta 2.6 cm IMPRESSION: 1. Status post Ross procedure with pulmonary valve and artery autograft in aortic position with re-implanted coronary buttons and homograft in the pulmonary position. There was mild autograft valve regurgitation and no significant stenosis. The homograft pulmonary valve was thickened with at least mild regurgitation and suspect a degree of stenosis. 2. There was dilation of the pulmonary autograft in the aortic position at the sinuses of valsalva, reaching 5.2 cm as measured into the left coronary cusp. There was also narrowing with some turbulent flow at the suture line where autograft was sewed into the native ascending aorta. Sinus of valsalva dilation does not appear to have significant progressed compared to prior MRA. Dalton Mclean Electronically Signed   By: Loralie Champagne M.D.   On: 08/19/2015 23:08     I have independently reviewed the above radiologic studies.  ECHO  10/11/2017: Echocardiography  Patient:    Kion, [place] MR #:       [id number] Study Date: 10/11/2017 Gender:     M Age:        89 Height:     190.5 cm Weight:     110.6 kg BSA:        2.44 m^2 Pt. Status: Room:   Elenore Paddy, M.D.  REFERRING    Loralie Champagne, M.D.  PERFORMING   [facility], Outpatient  SONOGRAPHER  [facility], RDCS  ATTENDING    Skeet Latch, MD  cc:  ------------------------------------------------------------------- LV EF: 55% -   60%  ------------------------------------------------------------------- Indications:      Valve Replacement (Z95.2).  ------------------------------------------------------------------- History:   PMH:  History of Ross Procedure (1994) now with Aortic Root Aneurysm.  Risk factors:  Hypertension. Dyslipidemia.  -------------------------------------------------------------------  Study Conclusions  - Left ventricle: The cavity size was normal. Systolic function was   normal. The estimated ejection fraction was in the range of 55%   to 60%. Wall motion was normal; there were no regional wall   motion abnormalities. Left ventricular diastolic function   parameters were normal. - Aortic valve: A bioprosthesis was present. Transvalvular velocity   was within the normal range. There was no stenosis. There was   mild regurgitation. Valve area (VTI): 5.15 cm^2. Valve area   (Vmax): 5.22 cm^2. Valve area (Vmean): 4.79 cm^2. - Aorta: Aortic root dimension: 47 mm (ED). Ascending aortic   diameter: 50 mm (S). - Aortic root: The aortic root was mildly dilated. - Ascending aorta: The ascending aorta was moderately dilated. - Mitral valve: Transvalvular velocity was within the normal range.   There was no evidence for stenosis. There was mild regurgitation. - Right ventricle: The cavity size was mildly dilated. Wall   thickness was normal. Systolic function was normal. - Right atrium: The atrium was severely dilated. - Tricuspid valve: There was mild regurgitation. - Pulmonic valve: A bioprosthesis was present. Transvalvular   velocity was within the normal range. There was no evidence for   stenosis. There was mild regurgitation. Peak gradient (S): 20 mm   Hg.  Impressions:  - Compared with the echo 03/2017, the ascending aorta has increased   from 4.5 cm to 5.0 cm.  ------------------------------------------------------------------- Study data:  Comparison was made to the study of 03/17/2017.  Study status:  Routine.  Procedure:  Transthoracic echocardiography. Image quality was adequate.          Echocardiography.  M-mode, complete 2D, 3D, spectral Doppler, and color Doppler.  Birthdate: Patient birthdate: Apr 30, 1951.  Age:  Patient is 68 yr old.  Sex: Gender: male.    BMI: 30.5 kg/m^2.  Blood pressure:     140/88 Patient status:  Outpatient.  Study date:   Study date: 10/11/2017. Study time: 08:40 AM.  Location:  [facility]  -------------------------------------------------------------------  ------------------------------------------------------------------- Left ventricle:  The cavity size was normal. Systolic function was normal. The estimated ejection fraction was in the range of 55% to 60%. Wall motion was normal; there were no regional wall motion abnormalities. The transmitral flow pattern was normal. The deceleration time of the early transmitral flow velocity was normal. The pulmonary vein flow pattern was normal. The tissue Doppler parameters were normal. Left ventricular diastolic function parameters were normal.  ------------------------------------------------------------------- Aortic valve:   Normal thickness leaflets. A bioprosthesis was present. Mobility was not restricted.  Doppler:  Transvalvular velocity was within the normal range. There was no stenosis. There was mild regurgitation.    VTI ratio of LVOT to aortic valve: 0.78. Valve area (VTI): 5.15 cm^2. Indexed valve area (VTI): 2.11 cm^2/m^2. Peak velocity ratio of LVOT to aortic valve: 0.79. Valve area (Vmax): 5.22 cm^2. Indexed valve area (Vmax): 2.14 cm^2/m^2. Mean velocity ratio of LVOT to aortic valve: 0.73. Valve area (Vmean): 4.79 cm^2. Indexed valve area (Vmean): 1.96 cm^2/m^2. Mean gradient (S): 1 mm Hg. Peak gradient (S): 2 mm Hg.  ------------------------------------------------------------------- Aorta:  Aortic root: The aortic root was mildly dilated. Ascending aorta: The ascending aorta was moderately dilated.  ------------------------------------------------------------------- Mitral valve:   Structurally normal valve.   Mobility was not restricted.  Doppler:  Transvalvular velocity was within the normal range. There was no evidence for stenosis. There was mild regurgitation.    Peak gradient (D): 2 mm Hg.   ------------------------------------------------------------------- Left atrium:  The atrium was normal in size.  ------------------------------------------------------------------- Right ventricle:  The cavity size was mildly dilated. Wall thickness was normal. Systolic function was normal.  ------------------------------------------------------------------- Pulmonic valve:   A bioprosthesis was present.  Doppler: Transvalvular velocity was within the normal range. There was no evidence for stenosis. There was mild regurgitation. Peak gradient (S): 20 mm Hg.  ------------------------------------------------------------------- Tricuspid valve:   Structurally normal valve.    Doppler: Transvalvular velocity was within the normal range. There was mild regurgitation.  ------------------------------------------------------------------- Pulmonary artery:   The main pulmonary artery was normal-sized. Systolic pressure was within the normal range.  ------------------------------------------------------------------- Right atrium:  The atrium was severely dilated.  ------------------------------------------------------------------- Pericardium:  There was no pericardial effusion.  ------------------------------------------------------------------- Systemic veins: Inferior vena cava: The vessel was normal in size.  ------------------------------------------------------------------- Measurements   Left ventricle                           Value          Reference  LV ID, ED, PLAX chordal                  47.1  mm       43 - 52  LV ID, ES, PLAX chordal                  31.8  mm       23 - 38  LV fx shortening, PLAX chordal           32    %        >=29  LV PW thickness, ED  10.1  mm       ----------  IVS/LV PW ratio, ED                      1.06           <=1.3  Stroke volume, 2D                        84    ml       ----------  Stroke volume/bsa, 2D                     34    ml/m^2   ----------  LV e&', lateral                           12.1  cm/s     ----------  LV E/e&', lateral                         6.3            ----------  LV e&', medial                            8.05  cm/s     ----------  LV E/e&', medial                          9.47           ----------  LV e&', average                           10.08 cm/s     ----------  LV E/e&', average                         7.56           ----------    Ventricular septum                       Value          Reference  IVS thickness, ED                        10.7  mm       ----------    LVOT                                     Value          Reference  LVOT ID, S                               29    mm       ----------  LVOT area                                6.61  cm^2     ----------  LVOT peak velocity, S                    58.4  cm/s     ----------  LVOT mean velocity, S                    35.9  cm/s     ----------  LVOT VTI, S                              12.7  cm       ----------    Aortic valve                             Value          Reference  Aortic valve peak velocity, S            73.9  cm/s     ----------  Aortic valve mean velocity, S            49.5  cm/s     ----------  Aortic valve VTI, S                      16.3  cm       ----------  Aortic mean gradient, S                  1     mm Hg    ----------  Aortic peak gradient, S                  2     mm Hg    ----------  VTI ratio, LVOT/AV                       0.78           ----------  Aortic valve area, VTI                   5.15  cm^2     ----------  Aortic valve area/bsa, VTI               2.11  cm^2/m^2 ----------  Velocity ratio, peak, LVOT/AV            0.79           ----------  Aortic valve area, peak velocity         5.22  cm^2     ----------  Aortic valve area/bsa, peak              2.14  cm^2/m^2 ----------  velocity  Velocity ratio, mean, LVOT/AV            0.73           ----------  Aortic valve area, mean  velocity         4.79  cm^2     ----------  Aortic valve area/bsa, mean              1.96  cm^2/m^2 ----------  velocity  Aortic regurg pressure half-time         719   ms       ----------    Aorta                                    Value          Reference  Aortic root ID, ED  47    mm       ----------  Ascending aorta ID, A-P, S               50    mm       ----------    RVOT                                     Value          Reference  RVOT area                                5.73  cm^2     ----------    Left atrium                              Value          Reference  LA ID, A-P, ES                           50    mm       ----------  LA ID/bsa, A-P                           2.05  cm/m^2   <=2.2  LA volume, S                             68.9  ml       ----------  LA volume/bsa, S                         28.2  ml/m^2   ----------  LA volume, ES, 1-p A4C                   69.4  ml       ----------  LA volume/bsa, ES, 1-p A4C               28.4  ml/m^2   ----------  LA volume, ES, 1-p A2C                   67.3  ml       ----------  LA volume/bsa, ES, 1-p A2C               27.6  ml/m^2   ----------    Mitral valve                             Value          Reference  Mitral E-wave peak velocity              76.2  cm/s     ----------  Mitral A-wave peak velocity              57.7  cm/s     ----------  Mitral deceleration time                 225   ms       150 - 230  Mitral peak gradient, D  2     mm Hg    ----------  Mitral E/A ratio, peak                   1.3            ----------    Tricuspid valve                          Value          Reference  Tricuspid regurg peak velocity           253   cm/s     ----------  Tricuspid peak RV-RA gradient            26    mm Hg    ----------    Right atrium                             Value          Reference  RA ID, S-I, ES, A4C              (H)     68    mm       34 - 49  RA area, ES, A4C                  (H)     32.2  cm^2     8.3 - 19.5  RA volume, ES, A/L                       122   ml       ----------  RA volume/bsa, ES, A/L                   49.9  ml/m^2   ----------    Systemic veins                           Value          Reference  Estimated CVP                            3     mm Hg    ----------    Right ventricle                          Value          Reference  TAPSE                                    21.8  mm       ----------  RV pressure, S, DP                       29    mm Hg    <=30  RV s&', lateral, S                        10.4  cm/s     ----------    Pulmonic valve                           Value  Reference  Pulmonic valve peak velocity, S          222   cm/s     ----------  Pulmonic peak gradient, S                20    mm Hg    ----------  Legend: (L)  and  (H)  mark values outside specified reference range.  ------------------------------------------------------------------- Prepared and Electronically Authenticated by  Skeet Latch, MD 2019-03-11T13:35:05   Study Conclusions Patient:    Izaha, Shughart MR #:       [id number] Study Date: 03/12/2016 Gender:     M Age:        36 Height:     190.5 cm Weight:     104.1 kg BSA:        2.36 m^2 Pt. Status: Room:   SONOGRAPHER  Wyatt Mage, RDCS  PERFORMING   [facility]  ATTENDING    Eileen Stanford  ORDERING     Eileen Stanford  REFERRING    Angelena Form R  cc:  ------------------------------------------------------------------- LV EF: 55% -   60%  ------------------------------------------------------------------- Indications:      Aortic valve disease (I35.9).  ------------------------------------------------------------------- History:   PMH:   Dyspnea.  Risk factors:  Thoracic aortic aneurysm. Hypertension. Dyslipidemia.  ------------------------------------------------------------------- Study Conclusions  - Left ventricle: The cavity size  was normal. Wall thickness was   normal. Systolic function was normal. The estimated ejection   fraction was in the range of 55% to 60%. Wall motion was normal;   there were no regional wall motion abnormalities. Left   ventricular diastolic function parameters were normal. - Ventricular septum: Septal motion showed paradox. - Aortic valve: A valve autograft (aortic, Ross procedure) was   present and functioning normally. There was mild regurgitation. - Mitral valve: There was mild regurgitation. - Left atrium: The atrium was mildly dilated. - Right ventricle: The cavity size was moderately dilated. Wall   thickness was normal. Systolic pressure was increased slightly   due to mild pulmonic valve homograft stenosis. - Right atrium: The atrium was mildly dilated. - Pulmonic valve: A pulmonic root homograft was present. The   findings are consistent with mild stenosis. Peak gradient (S): 22   mm Hg.  Impressions:  - Recommend CT angiography or MRI study for more accurate   measurement of the ascending aorta.  ------------------------------------------------------------------- Labs, prior tests, procedures, and surgery: Transthoracic echocardiography (12/10/2014).    The aortic valve showed mild regurgitation.  EF was 60%. Aortic valve: mean gradient of 2 mm Hg.  Valve surgery.     Aortic valve replacement. Pulmonic autograft. Pulmonic valve replacement with a homograft. Surgery.    Ross procedure.  ------------------------------------------------------------------- Study data:  Comparison was made to the study of 12/10/2014.  Study status:  Routine.  Procedure:  The patient reported no pain pre or post test. Transthoracic echocardiography. Image quality was adequate.  Study completion:  There were no complications. Transthoracic echocardiography.  M-mode, complete 2D, spectral Doppler, and color Doppler.  Birthdate:  Patient birthdate: 1951/05/31.  Age:  Patient is 68 yr  old.  Sex:  Gender: male. BMI: 28.7 kg/m^2.  Blood pressure:     122/82  Patient status: Outpatient.  Study date:  Study date: 03/12/2016. Study time: 03:49 PM.  Location:  [facility]  -------------------------------------------------------------------  ------------------------------------------------------------------- Left ventricle:  The cavity size was normal. Wall thickness was normal. Systolic function was normal. The estimated ejection fraction was in  the range of 55% to 60%. Wall motion was normal; there were no regional wall motion abnormalities. The transmitral flow pattern was normal. The deceleration time of the early transmitral flow velocity was normal. The pulmonary vein flow pattern was normal. The tissue Doppler parameters were normal. Left ventricular diastolic function parameters were normal.  ------------------------------------------------------------------- Aortic valve:   Normal thickness leaflets. A valve autograft (aortic, Ross procedure) was present and functioning normally. Mobility was not restricted.  Doppler:  Transvalvular velocity was within the normal range. There was no stenosis. There was mild regurgitation.    VTI ratio of LVOT to aortic valve: 0.86. Peak velocity ratio of LVOT to aortic valve: 0.82. Mean velocity ratio of LVOT to aortic valve: 0.76.    Mean gradient (S): 2 mm Hg. Peak gradient (S): 4 mm Hg.  ------------------------------------------------------------------- Aorta:  The aorta was poorly visualized. Aortic root: The aortic root was normal in size. Ascending aorta: The ascending aorta was severely dilated. Lesion: There was a mild coarctation at the arch-descending aorta junction. Peak velocity across the coarctation is 220 cm/s.  ------------------------------------------------------------------- Mitral valve:   Structurally normal valve.   Mobility was not restricted.  Doppler:  Transvalvular velocity was within the  normal range. There was no evidence for stenosis. There was mild regurgitation.    Peak gradient (D): 3 mm Hg.  ------------------------------------------------------------------- Left atrium:  The atrium was mildly dilated.  ------------------------------------------------------------------- Right ventricle:  The cavity size was moderately dilated. Wall thickness was normal. Systolic function was normal. Systolic pressure was increased slightly due to mild pulmonic valve homograft stenosis.  ------------------------------------------------------------------- Ventricular septum:   Septal motion showed paradox.  ------------------------------------------------------------------- Pulmonic valve:   A pulmonic root homograft was present.  Doppler:  The findings are consistent with mild stenosis.   There was no significant regurgitation. Peak gradient (S): 22 mm Hg.  ------------------------------------------------------------------- Tricuspid valve:   Structurally normal valve.    Doppler: Transvalvular velocity was within the normal range. There was mild regurgitation.  ------------------------------------------------------------------- Pulmonary artery:   The main pulmonary artery was normal-sized. Systolic pressure was within the normal range.  ------------------------------------------------------------------- Right atrium:  The atrium was mildly dilated.  ------------------------------------------------------------------- Pericardium:  There was no pericardial effusion.  ------------------------------------------------------------------- Systemic veins: Inferior vena cava: The vessel was normal in size.  ------------------------------------------------------------------- Post procedure conclusions Ascending Aorta:  - The aorta was poorly visualized.  ------------------------------------------------------------------- Measurements   Left ventricle                              Value         Reference  LV ID, ED, PLAX chordal            (H)     57.7   mm     43 - 52  LV ID, ES, PLAX chordal            (H)     40.1   mm     23 - 38  LV fx shortening, PLAX chordal             31     %      >=29  LV PW thickness, ED                        10.4   mm     ---------  IVS/LV PW ratio, ED  0.88          <=1.3  LV e&', lateral                             13.5   cm/s   ---------  LV E/e&', lateral                           5.9           ---------  LV e&', medial                              4.47   cm/s   ---------  LV E/e&', medial                            17.83         ---------  LV e&', average                             8.99   cm/s   ---------  LV E/e&', average                           8.87          ---------    Ventricular septum                         Value         Reference  IVS thickness, ED                          9.13   mm     ---------    LVOT                                       Value         Reference  LVOT peak velocity, S                      86.4   cm/s   ---------  LVOT mean velocity, S                      52.7   cm/s   ---------  LVOT VTI, S                                18.5   cm     ---------    Aortic valve                               Value         Reference  Aortic valve peak velocity, S              105    cm/s   ---------  Aortic valve mean velocity, S              69     cm/s   ---------  Aortic valve VTI, S  21.6   cm     ---------  Aortic mean gradient, S                    2      mm Hg  ---------  Aortic peak gradient, S                    4      mm Hg  ---------  VTI ratio, LVOT/AV                         0.86          ---------  Velocity ratio, peak, LVOT/AV              0.82          ---------  Velocity ratio, mean, LVOT/AV              0.76          ---------  Aortic regurg pressure half-time           581    ms     ---------    Aorta                                       Value         Reference  Aortic root ID, ED                         46     mm     ---------  Ascending aorta ID, A-P, S                 49     mm     ---------    Left atrium                                Value         Reference  LA ID, A-P, ES                             44.85  mm     ---------  LA ID/bsa, A-P                             1.9    cm/m^2 <=2.2  LA volume, S                               57     ml     ---------  LA volume/bsa, S                           24.1   ml/m^2 ---------  LA volume, ES, 1-p A4C                     50     ml     ---------  LA volume/bsa, ES, 1-p A4C                 21.2   ml/m^2 ---------  LA volume, ES, 1-p A2C  62     ml     ---------  LA volume/bsa, ES, 1-p A2C                 26.2   ml/m^2 ---------    Mitral valve                               Value         Reference  Mitral E-wave peak velocity                79.7   cm/s   ---------  Mitral A-wave peak velocity                55.9   cm/s   ---------  Mitral deceleration time                   218    ms     150 - 230  Mitral peak gradient, D                    3      mm Hg  ---------  Mitral E/A ratio, peak                     1.4           ---------    Pulmonary arteries                         Value         Reference  PA pressure, S, DP                         16     mm Hg  <=30    Tricuspid valve                            Value         Reference  Tricuspid regurg peak velocity             274.64 cm/s   ---------  Tricuspid peak RV-RA gradient              30     mm Hg  ---------  Tricuspid maximal regurg velocity,         274.64 cm/s   ---------  PISA    Systemic veins                             Value         Reference  Estimated CVP                              8      mm Hg  ---------    Right ventricle                            Value         Reference  RV ID, minor axis, ED, A4C         (H)     47.38  mm     26 - 43  RV pressure, S, DP                 (  H)      38     mm Hg  <=30  RV s&', lateral, S                          12.5   cm/s   ---------    Pulmonic valve                             Value         Reference  Pulmonic valve peak velocity, S            236    cm/s   ---------  Pulmonic peak gradient, S                  22     mm Hg  ---------  Legend: (L)  and  (H)  mark values outside specified reference range.  ------------------------------------------------------------------- Prepared and Electronically Authenticated by  Sanda Klein, MD 2017-08-10T17:21:16   Recent Lab Findings: Lab Results  Component Value Date   WBC 8.3 12/22/2018   HGB 14.7 12/22/2018   HCT 43.3 12/22/2018   PLT 253.0 12/22/2018   GLUCOSE 121 (H) 12/22/2018   CHOL 118 12/22/2018   TRIG 92.0 12/22/2018   HDL 39.90 12/22/2018   LDLDIRECT 69.8 05/31/2014   LDLCALC 60 12/22/2018   ALT 22 12/22/2018   AST 20 12/22/2018   NA 137 12/22/2018   K 4.7 12/22/2018   CL 104 12/22/2018   CREATININE 0.81 12/22/2018   BUN 12 12/22/2018   CO2 25 12/22/2018   TSH 2.44 12/22/2018   HGBA1C 6.7 (H) 12/23/2018      Assessment / Plan:   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    Patient's last echocardiogram was in March 2019, timing of repeat echo per cardiology   I would not recommend repeat surgery at this time without significant aortic insufficiency.  The homograft root appears to be unchanged in size on all the scans that are available, and does not appear to be progressing the enlarging.    I discussed with him endocarditis precautions   and avoiding weight lifting [employer] Shown   Patient also has incidental note of an elevated left diaphragm, this is not commented on an old chest x-ray reports, but as noted on previous MRI scans. 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  Plan to see back in  12  months with a MRA of the chest to evaluate size of his aorta.  Grace Isaac MD      [address] [place] [address] Office [phone]   Beeper [phone]  01/31/2019 1:45 PM

## 2019-01-31 NOTE — Progress Notes (Deleted)
[address]       [place] [address]             [phone]                    [name] [name] [place] Medical Record #[id number] Date of Birth: [birth year]  Referring: [name], MD Primary Care: [name], MD  Chief Complaint:    No chief complaint on file.   History of Present Illness:    [name] [name] 68 y.o. male followed in the  office after Harrington Challenger procedure done in [place]   for congenital aortic insufficiency  Case almost  25 years ago.  The patient is asymptomatic from a heart failure standpoint.  He denies angina, PND, dyspnea on exertion.  He remains active playing golf frequently.  His only complaint is increasing abdominal girth.   He has been noted to have dilatation of his aortic root at the sinuses of Valsalva following his Ross procedure. Reports from echocardiogram in 2002 measured aortic root at 5.1  cm,The patient's mid ascending aorta which is partially native aorta and partial prosthetic graft is not significantly dilated.  Review of cardiac MRIs back to 2015 showed little change in the homograft root.   Patient has elevated left hemidiaphragm noted back on previous scans.   pmh:  1. Aortic insufficiency: s/p Ross procedure in 1994 in [place]. Aortic insufficiency: Echo (4/11) with hypokinetic basal septum (likely post-surgical), EF 50%, mild LVH, s/p Ross procedure with native pulmonic valve in aortic position with mild aortic insufficiency and bioprosthetic pulmonic valve with no pulmonic insufficiency, mild MR, aortic upper normal in size. Echo (4/13) with EF 55%, mild LVH, mild AI, mild MR, mild RV dilation, bioprosthetic pulmonic valve with peak pressure 25 mmHg. Echo (3/14): Mild LV dilation, mild LVH, EF 60%, pulmonary valve in aortic position (s/p Ross) with mild AI and mildly dilated ascending aorta to 4.3 cm, mildly dilated RV with normal systolic function, bioprosthetic PVR with mean gradient 23 mmHg. Echo  (3/15) with EF 55-60%, mild LVH, mild AI with no AS, bioprosthetic pulmonic valve with peak gradient 31 mmHg, ascending aorta 4.4 cm. Cardiac MRI/MRA chest (5/15) with mild regurgitation of autograft aortic valve and mild regurgitation of homograft pulmonic valve, dilation of aortic root at sinuses of valsalva (4.8 cm), EF 6%, no LGE, mild RV dilation with normal RV systolic function. Echo (5/16) with EF 55-60%, mild autograft AI, mild MR, mildly decreased RV systolic function, biatrial enlargement, RVSP 35 mmHg, trivially increased gradient across bioprosthetic pulmonic valve (no progression).  2. Post-operative atrial fibrillation after heart surgery.  3. Normal left heart cath prior to 1994 heart surgery.  4. Left brachial plexopathy with left shoulder muscle atrophy. Uncertain etiology.  5. BPH 6. Hyperlipidemia.  7. Cervical spinal stenosis with radiculopathy.  8. 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  9. ETT-Sestamibi (3/14): No ischemia or infarction.  10. Carotid dopplers (6/14): mild disease only.      Current Activity/ Functional Status:  Patient is independent with mobility/ambulation, transfers, ADL's, IADL's.   Zubrod Score: At the time of surgery this patients most appropriate activity status/level should be described as: [x]     0    Normal activity, no symptoms []     1    Restricted in physical strenuous activity but ambulatory, able to do out light work []   2    Ambulatory and capable of self care, unable to do work activities, up and about               >50 % of waking hours                              []     3    Only limited self care, in bed greater than 50% of waking hours []     4    Completely disabled, no self care, confined to bed or chair []     5    Moribund   Past Medical History:  Diagnosis Date   Acute gastritis    Aortic insufficiency    s/p aortic valve replacement  in 1994   Early satiety    Esophageal stricture    GERD (gastroesophageal reflux disease)    Headache syndrome 10/18/2018   Hiatal hernia    Hyperlipidemia    Neuropathy    left brachial plexus   Pain, dental     Past Surgical History:  Procedure Laterality Date   AORTIC VALVE REPLACEMENT  1994   GANGLION CYST EXCISION  JAN 2002   HIATAL HERNIA REPAIR  DEC 2002   TONSILLECTOMY     VASECTOMY  DEC 2002    Family History  Problem Relation Age of Onset   Lung cancer Father        hx of smoking    Hypertension Other    Prostate cancer Other    Leukemia Brother        Passed at age of 3   Colon cancer Neg Hx    Pancreatic cancer Neg Hx    Rectal cancer Neg Hx    Stomach cancer Neg Hx     Social History   Socioeconomic History   Marital status: Married    Spouse name: Not on file   Number of children: 3   Years of education: Not on file   Highest education level: Not on file  Occupational History   Not on file  Social Needs   Financial resource strain: Not on file   Food insecurity:    Worry: Not on file    Inability: Not on file   Transportation needs:    Medical: Not on file    Non-medical: Not on file  Tobacco Use   Smoking status: Never Smoker   Smokeless tobacco: Never Used    Social History   Tobacco Use  Smoking Status Never Smoker  Smokeless Tobacco Never Used    Social History   Substance and Sexual Activity  Alcohol Use No     Allergies  Allergen Reactions   Penicillins Hives and Swelling   Quinidine Other (See Comments)    Increase heart beat per patient    Current Outpatient Medications  Medication Sig Dispense Refill   aspirin 81 MG chewable tablet Chew 81 mg by mouth daily.       cetirizine (ZYRTEC) 10 MG tablet TAKE 1 TABLET(10 MG) BY MOUTH DAILY 90 tablet 3   finasteride (PROSCAR) 5 MG tablet TAKE 1 TABLET(5 MG) BY MOUTH DAILY 90 tablet 1   fluticasone (FLONASE) 50 MCG/ACT nasal spray  SHAKE LIQUID AND USE 2 SPRAYS IN EACH NOSTRIL DAILY 16 g 6   metoprolol succinate (TOPROL-XL) 100 MG 24 hr tablet TAKE 1/2 TABLET BY MOUTH EVERY MORNING AND 1 TABLET BY MOUTH EVERY EVENING(APPOINTMENT NEEDED FOR REFILLS) 135 tablet  0   simvastatin (ZOCOR) 20 MG tablet TAKE 1 TABLET(20 MG) BY MOUTH DAILY 90 tablet 1   telmisartan (MICARDIS) 80 MG tablet TAKE 1 TABLET(80 MG) BY MOUTH DAILY 90 tablet 1   VIAGRA 100 MG tablet      No current facility-administered medications for this visit.       Review of Systems:    ROS Immunizations: Flu up to date [ y ]; Pneumococcal up to date [ y ];   Physical Exam: There were no vitals taken for this visit.  PHYSICAL EXAMINATION: {physical exam:21449}   Diagnostic Studies & Laboratory data:     Recent Radiology Findings:   Mr Angiogram Chest W Wo Contrast  Result Date: 01/27/2019 CLINICAL DATA:  Thoracic aortic aneurysm without rupture, follow-up. Ross procedure AVR (pulmonary valve autograft) 1994. EXAM: MRA CHEST WITH OR WITHOUT CONTRAST TECHNIQUE: Angiographic images of the chest were obtained using MRA technique with and without intravenous contrast. CONTRAST:  16mL MULTIHANCE GADOBENATE DIMEGLUMINE 529 MG/ML IV SOLN Creatinine was obtained on site at [facility] at [address] Results: Creatinine 0.9 mg/dL.  GFR 91. COMPARISON:  CT 04/28/2018 and previous FINDINGS: VASCULAR Aorta: Transverse diameters as follows: 6 cm sinuses of Valsalva (previously 6.4) 4.6 cm proximal ascending (stable by my measurement) 4 cm distal ascending/proximal arch (previously 4.1 by my measurement) 3.5 cm distal arch/proximal descending 3.1 cm distal descending No dissection or stenosis. No significant atheromatous irregularity. Classic 3 vessel brachiocephalic arterial origin anatomy. Visualized proximal abdominal aorta unremarkable. Heart: Normal size.  No pericardial effusion. Pulmonary Arteries: Postop change in the proximal main pulmonary  artery as before. Central branches unremarkable. Limited evaluation of segmental and subsegmental branches. Other: None NON-VASCULAR Spinal cord: Negative limited evaluation Brachial plexus: Incomplete evaluation Muscles and tendons: Negative Bones: Sternotomy wires. Joints: Negative limited evaluation IMPRESSION: 1. No acute findings. 2. Stable 6 cm aortic root dilatation post Ross procedure. 3. 4-4.6 cm ascending thoracic aortic aneurysm, stable. Recommend continued imaging surveillance. Electronically Signed   By: Lucrezia Europe M.D.   On: 01/27/2019 07:53   I have independently reviewed the above radiology studies  and reviewed the findings with the patient. I have reviewed the films with the patient and compared them back to 2015.  It does appear aneurysmal dilatation of the aortic root is stable and extends to the left coronary button.  However the measurement of 6.3 was not made perpendicular to  flow.   Mr Angiogram Chest W Wo Contrast  Result Date: 09/09/2016 CLINICAL DATA:  Status post Harrington Challenger procedure with follow-up of dilated aortic root. EXAM: MRA CHEST WITH OR WITHOUT CONTRAST TECHNIQUE: Preliminary sequences were obtained prior to administration of contrast. Angiographic images of the chest were obtained using MRA technique with intravenous contrast. CONTRAST:  8mL MULTIHANCE GADOBENATE DIMEGLUMINE 529 MG/ML IV SOLN COMPARISON:  08/19/2015 FINDINGS: VASCULAR Aorta: Dilatation of the pulmonary autograft at the level of the sinuses of Valsalva appears grossly stable. Maximal diameter measuring into the left cusp is approximately 5.4 cm. Diameter measuring into the right cusp is approximately 5.1 cm. Diameter measuring into the non coronary cusp is approximately 5.0 cm. Anastomosis of autograft and native aorta appears stable and measures 2.4 cm. Ascending aorta measures 3.3 cm. Aortic arch measures 3 cm. Descending thoracic aorta measures 2.9 cm. No evidence of aortic dissection. Proximal great  vessels show stable and normal patency. Heart: Heart size appears stable and normal. No pericardial fluid identified. Pulmonary Vasculature: No dilatation of pulmonary arteries. Stable appearance of  central pulmonary veins. NON-VASCULAR Stable elevation of left hemidiaphragm. No lymphadenopathy or masses identified. No pleural fluid or pulmonary abnormalities identified. IMPRESSION: Stable dilatation of pulmonary autograft at the level of the sinuses of Valsalva. Stable relative narrowing of autograft anastomosis with the native aorta. No evidence of aortic dissection. Electronically Signed   By: Aletta Edouard M.D.   On: 09/09/2016 13:43    Mr Angiogram Chest W Wo Contrast  08/19/2015  CLINICAL DATA:  S/p Ross procedure with dilated aortic root. EXAM: CARDIAC MRI TECHNIQUE: The patient was scanned on a 1.5 Tesla GE magnet. A dedicated cardiac coil was used. FIESTA sequences were done for functional assessment. The patient received 25 cc of Multihance contrast for MR angiography. CONTRAST:  25 cc Multihance FINDINGS: The patient was s/p sternotomy. Limited images of lung fields showed no gross abnormalities. There was a pulmonary valve and artery autograft in the aortic position. The autograft valve was trileaflet with mild autograft valve insufficiency. There did not appear to be autograft valve stenosis. There was a homograft in the pulmonary position. The homograft valve was thickened with probably mild regurgitation. There was turbulent flow across the homograft valve suggestive of some degree of stenosis (this is better assessed by echo). The autograft in the aortic position had dilated sinuses of valsalva, especially the left coronary sinus (SOV measured 5.2 cm when measured into the left coronary sinus). The coronary arteries were visualized and appeared patent proximally (coronary buttons re-attached to pulmonary autograft). There was narrowing at the suture line where the autograft was sewed into the  native ascending aorta. At the narrowing, the ascending aorta was 2.4 cm in diameter. There did appear to be some turbulent flow at this narrowing. The aortic arch was not dilated. The great vessels had normal origins. The pulmonary veins appeared to drain normally to the left atrium (though the right-sided veins were not well-visualized). MEASUREMENTS: MEASUREMENTS Sinus of valsalva measurement into left cusp 5.2 cm, into right cusp 4.7 cm, into noncoronary cusp 4.2 cm Sinotubular junction 4.5 cm Ascending aorta at narrowing where autograft joined to native aorta 2.4 cm Aortic arch 3.2 cm Descending thoracic aorta 2.6 cm IMPRESSION: 1. Status post Ross procedure with pulmonary valve and artery autograft in aortic position with re-implanted coronary buttons and homograft in the pulmonary position. There was mild autograft valve regurgitation and no significant stenosis. The homograft pulmonary valve was thickened with at least mild regurgitation and suspect a degree of stenosis. 2. There was dilation of the pulmonary autograft in the aortic position at the sinuses of valsalva, reaching 5.2 cm as measured into the left coronary cusp. There was also narrowing with some turbulent flow at the suture line where autograft was sewed into the native ascending aorta. Sinus of valsalva dilation does not appear to have significant progressed compared to prior MRA. Dalton Mclean Electronically Signed   By: Loralie Champagne M.D.   On: 08/19/2015 23:08     I have independently reviewed the above radiologic studies.  ECHO  10/11/2017: Echocardiography  Patient:    [name] [name], [name] [name] MR #:       [id number] Study Date: 10/11/2017 Gender:     M Age:        28 Height:     190.5 cm Weight:     110.6 kg BSA:        2.44 m^2 Pt. Status: Room:   Elenore Paddy, M.D.  REFERRING    Loralie Champagne, M.D.  PERFORMING  [facility], Outpatient  SONOGRAPHER  [facility], RDCS  ATTENDING    Skeet Latch,  MD  cc:  ------------------------------------------------------------------- LV EF: 55% -   60%  ------------------------------------------------------------------- Indications:      Valve Replacement (Z95.2).  ------------------------------------------------------------------- History:   PMH:  History of Ross Procedure (1994) now with Aortic Root Aneurysm.  Risk factors:  Hypertension. Dyslipidemia.  ------------------------------------------------------------------- Study Conclusions  - Left ventricle: The cavity size was normal. Systolic function was   normal. The estimated ejection fraction was in the range of 55%   to 60%. Wall motion was normal; there were no regional wall   motion abnormalities. Left ventricular diastolic function   parameters were normal. - Aortic valve: A bioprosthesis was present. Transvalvular velocity   was within the normal range. There was no stenosis. There was   mild regurgitation. Valve area (VTI): 5.15 cm^2. Valve area   (Vmax): 5.22 cm^2. Valve area (Vmean): 4.79 cm^2. - Aorta: Aortic root dimension: 47 mm (ED). Ascending aortic   diameter: 50 mm (S). - Aortic root: The aortic root was mildly dilated. - Ascending aorta: The ascending aorta was moderately dilated. - Mitral valve: Transvalvular velocity was within the normal range.   There was no evidence for stenosis. There was mild regurgitation. - Right ventricle: The cavity size was mildly dilated. Wall   thickness was normal. Systolic function was normal. - Right atrium: The atrium was severely dilated. - Tricuspid valve: There was mild regurgitation. - Pulmonic valve: A bioprosthesis was present. Transvalvular   velocity was within the normal range. There was no evidence for   stenosis. There was mild regurgitation. Peak gradient (S): 20 mm   Hg.  Impressions:  - Compared with the echo 03/2017, the ascending aorta has increased   from 4.5 cm to 5.0  cm.  ------------------------------------------------------------------- Study data:  Comparison was made to the study of 03/17/2017.  Study status:  Routine.  Procedure:  Transthoracic echocardiography. Image quality was adequate.          Echocardiography.  M-mode, complete 2D, 3D, spectral Doppler, and color Doppler.  Birthdate: Patient birthdate: June 18, 1951.  Age:  Patient is 68 yr old.  Sex: Gender: male.    BMI: 30.5 kg/m^2.  Blood pressure:     140/88 Patient status:  Outpatient.  Study date:  Study date: 10/11/2017. Study time: 08:40 AM.  Location:   Site 3  -------------------------------------------------------------------  ------------------------------------------------------------------- Left ventricle:  The cavity size was normal. Systolic function was normal. The estimated ejection fraction was in the range of 55% to 60%. Wall motion was normal; there were no regional wall motion abnormalities. The transmitral flow pattern was normal. The deceleration time of the early transmitral flow velocity was normal. The pulmonary vein flow pattern was normal. The tissue Doppler parameters were normal. Left ventricular diastolic function parameters were normal.  ------------------------------------------------------------------- Aortic valve:   Normal thickness leaflets. A bioprosthesis was present. Mobility was not restricted.  Doppler:  Transvalvular velocity was within the normal range. There was no stenosis. There was mild regurgitation.    VTI ratio of LVOT to aortic valve: 0.78. Valve area (VTI): 5.15 cm^2. Indexed valve area (VTI): 2.11 cm^2/m^2. Peak velocity ratio of LVOT to aortic valve: 0.79. Valve area (Vmax): 5.22 cm^2. Indexed valve area (Vmax): 2.14 cm^2/m^2. Mean velocity ratio of LVOT to aortic valve: 0.73. Valve area (Vmean): 4.79 cm^2. Indexed valve area (Vmean): 1.96 cm^2/m^2. Mean gradient (S): 1 mm Hg. Peak gradient (S): 2 mm  Hg.  ------------------------------------------------------------------- Aorta:  Aortic  root: The aortic root was mildly dilated. Ascending aorta: The ascending aorta was moderately dilated.  ------------------------------------------------------------------- Mitral valve:   Structurally normal valve.   Mobility was not restricted.  Doppler:  Transvalvular velocity was within the normal range. There was no evidence for stenosis. There was mild regurgitation.    Peak gradient (D): 2 mm Hg.  ------------------------------------------------------------------- Left atrium:  The atrium was normal in size.  ------------------------------------------------------------------- Right ventricle:  The cavity size was mildly dilated. Wall thickness was normal. Systolic function was normal.  ------------------------------------------------------------------- Pulmonic valve:   A bioprosthesis was present.  Doppler: Transvalvular velocity was within the normal range. There was no evidence for stenosis. There was mild regurgitation. Peak gradient (S): 20 mm Hg.  ------------------------------------------------------------------- Tricuspid valve:   Structurally normal valve.    Doppler: Transvalvular velocity was within the normal range. There was mild regurgitation.  ------------------------------------------------------------------- Pulmonary artery:   The main pulmonary artery was normal-sized. Systolic pressure was within the normal range.  ------------------------------------------------------------------- Right atrium:  The atrium was severely dilated.  ------------------------------------------------------------------- Pericardium:  There was no pericardial effusion.  ------------------------------------------------------------------- Systemic veins: Inferior vena cava: The vessel was normal in  size.  ------------------------------------------------------------------- Measurements   Left ventricle                           Value          Reference  LV ID, ED, PLAX chordal                  47.1  mm       43 - 52  LV ID, ES, PLAX chordal                  31.8  mm       23 - 38  LV fx shortening, PLAX chordal           32    %        >=29  LV PW thickness, ED                      10.1  mm       ----------  IVS/LV PW ratio, ED                      1.06           <=1.3  Stroke volume, 2D                        84    ml       ----------  Stroke volume/bsa, 2D                    34    ml/m^2   ----------  LV e&', lateral                           12.1  cm/s     ----------  LV E/e&', lateral                         6.3            ----------  LV e&', medial                            8.05  cm/s     ----------  LV E/e&', medial                          9.47           ----------  LV e&', average                           10.08 cm/s     ----------  LV E/e&', average                         7.56           ----------    Ventricular septum                       Value          Reference  IVS thickness, ED                        10.7  mm       ----------    LVOT                                     Value          Reference  LVOT ID, S                               29    mm       ----------  LVOT area                                6.61  cm^2     ----------  LVOT peak velocity, S                    58.4  cm/s     ----------  LVOT mean velocity, S                    35.9  cm/s     ----------  LVOT VTI, S                              12.7  cm       ----------    Aortic valve                             Value          Reference  Aortic valve peak velocity, S            73.9  cm/s     ----------  Aortic valve mean velocity, S            49.5  cm/s     ----------  Aortic valve VTI, S                      16.3  cm       ----------  Aortic mean gradient, S                  1     mm Hg     ----------  Aortic peak gradient, S                  2     mm Hg    ----------  VTI ratio, LVOT/AV                       0.78           ----------  Aortic valve area, VTI                   5.15  cm^2     ----------  Aortic valve area/bsa, VTI               2.11  cm^2/m^2 ----------  Velocity ratio, peak, LVOT/AV            0.79           ----------  Aortic valve area, peak velocity         5.22  cm^2     ----------  Aortic valve area/bsa, peak              2.14  cm^2/m^2 ----------  velocity  Velocity ratio, mean, LVOT/AV            0.73           ----------  Aortic valve area, mean velocity         4.79  cm^2     ----------  Aortic valve area/bsa, mean              1.96  cm^2/m^2 ----------  velocity  Aortic regurg pressure half-time         719   ms       ----------    Aorta                                    Value          Reference  Aortic root ID, ED                       47    mm       ----------  Ascending aorta ID, A-P, S               50    mm       ----------    RVOT                                     Value          Reference  RVOT area                                5.73  cm^2     ----------    Left atrium                              Value          Reference  LA ID, A-P, ES                           50    mm       ----------  LA  ID/bsa, A-P                           2.05  cm/m^2   <=2.2  LA volume, S                             68.9  ml       ----------  LA volume/bsa, S                         28.2  ml/m^2   ----------  LA volume, ES, 1-p A4C                   69.4  ml       ----------  LA volume/bsa, ES, 1-p A4C               28.4  ml/m^2   ----------  LA volume, ES, 1-p A2C                   67.3  ml       ----------  LA volume/bsa, ES, 1-p A2C               27.6  ml/m^2   ----------    Mitral valve                             Value          Reference  Mitral E-wave peak velocity              76.2  cm/s     ----------  Mitral A-wave peak velocity              57.7   cm/s     ----------  Mitral deceleration time                 225   ms       150 - 230  Mitral peak gradient, D                  2     mm Hg    ----------  Mitral E/A ratio, peak                   1.3            ----------    Tricuspid valve                          Value          Reference  Tricuspid regurg peak velocity           253   cm/s     ----------  Tricuspid peak RV-RA gradient            26    mm Hg    ----------    Right atrium                             Value          Reference  RA ID, S-I, ES, A4C              (H)     68    mm       34 -  49  RA area, ES, A4C                 (H)     32.2  cm^2     8.3 - 19.5  RA volume, ES, A/L                       122   ml       ----------  RA volume/bsa, ES, A/L                   49.9  ml/m^2   ----------    Systemic veins                           Value          Reference  Estimated CVP                            3     mm Hg    ----------    Right ventricle                          Value          Reference  TAPSE                                    21.8  mm       ----------  RV pressure, S, DP                       29    mm Hg    <=30  RV s&', lateral, S                        10.4  cm/s     ----------    Pulmonic valve                           Value          Reference  Pulmonic valve peak velocity, S          222   cm/s     ----------  Pulmonic peak gradient, S                20    mm Hg    ----------  Legend: (L)  and  (H)  mark values outside specified reference range.  ------------------------------------------------------------------- Prepared and Electronically Authenticated by  Skeet Latch, MD 2019-03-11T13:35:05   Study Conclusions Patient:    [name] [name], [name] [name] MR #:       [id number] Study Date: 03/12/2016 Gender:     M Age:        43 Height:     190.5 cm Weight:     104.1 kg BSA:        2.36 m^2 Pt. Status: Room:   SONOGRAPHER  Wyatt Mage, RDCS  PERFORMING   [facility]patient  ATTENDING    Eileen Stanford  ORDERING     Eileen Stanford  REFERRING    Angelena Form R  cc:  ------------------------------------------------------------------- LV EF: 55% -   60%  ------------------------------------------------------------------- Indications:      Aortic valve disease (I35.9).  ------------------------------------------------------------------- History:  PMH:   Dyspnea.  Risk factors:  Thoracic aortic aneurysm. Hypertension. Dyslipidemia.  ------------------------------------------------------------------- Study Conclusions  - Left ventricle: The cavity size was normal. Wall thickness was   normal. Systolic function was normal. The estimated ejection   fraction was in the range of 55% to 60%. Wall motion was normal;   there were no regional wall motion abnormalities. Left   ventricular diastolic function parameters were normal. - Ventricular septum: Septal motion showed paradox. - Aortic valve: A valve autograft (aortic, Ross procedure) was   present and functioning normally. There was mild regurgitation. - Mitral valve: There was mild regurgitation. - Left atrium: The atrium was mildly dilated. - Right ventricle: The cavity size was moderately dilated. Wall   thickness was normal. Systolic pressure was increased slightly   due to mild pulmonic valve homograft stenosis. - Right atrium: The atrium was mildly dilated. - Pulmonic valve: A pulmonic root homograft was present. The   findings are consistent with mild stenosis. Peak gradient (S): 22   mm Hg.  Impressions:  - Recommend CT angiography or MRI study for more accurate   measurement of the ascending aorta.  ------------------------------------------------------------------- Labs, prior tests, procedures, and surgery: Transthoracic echocardiography (12/10/2014).    The aortic valve showed mild regurgitation.  EF was 60%. Aortic valve: mean gradient of 2 mm Hg.  Valve surgery.     Aortic valve  replacement. Pulmonic autograft. Pulmonic valve replacement with a homograft. Surgery.    Ross procedure.  ------------------------------------------------------------------- Study data:  Comparison was made to the study of 12/10/2014.  Study status:  Routine.  Procedure:  The patient reported no pain pre or post test. Transthoracic echocardiography. Image quality was adequate.  Study completion:  There were no complications. Transthoracic echocardiography.  M-mode, complete 2D, spectral Doppler, and color Doppler.  Birthdate:  Patient birthdate: [birth year].  Age:  Patient is 68 yr old.  Sex:  Gender: male. BMI: 28.7 kg/m^2.  Blood pressure:     122/82  Patient status: Outpatient.  Study date:  Study date: 03/12/2016. Study time: 03:49 PM.  Location:  [facility]  -------------------------------------------------------------------  ------------------------------------------------------------------- Left ventricle:  The cavity size was normal. Wall thickness was normal. Systolic function was normal. The estimated ejection fraction was in the range of 55% to 60%. Wall motion was normal; there were no regional wall motion abnormalities. The transmitral flow pattern was normal. The deceleration time of the early transmitral flow velocity was normal. The pulmonary vein flow pattern was normal. The tissue Doppler parameters were normal. Left ventricular diastolic function parameters were normal.  ------------------------------------------------------------------- Aortic valve:   Normal thickness leaflets. A valve autograft (aortic, Ross procedure) was present and functioning normally. Mobility was not restricted.  Doppler:  Transvalvular velocity was within the normal range. There was no stenosis. There was mild regurgitation.    VTI ratio of LVOT to aortic valve: 0.86. Peak velocity ratio of LVOT to aortic valve: 0.82. Mean velocity ratio of LVOT to aortic valve: 0.76.    Mean  gradient (S): 2 mm Hg. Peak gradient (S): 4 mm Hg.  ------------------------------------------------------------------- Aorta:  The aorta was poorly visualized. Aortic root: The aortic root was normal in size. Ascending aorta: The ascending aorta was severely dilated. Lesion: There was a mild coarctation at the arch-descending aorta junction. Peak velocity across the coarctation is 220 cm/s.  ------------------------------------------------------------------- Mitral valve:   Structurally normal valve.   Mobility was not restricted.  Doppler:  Transvalvular velocity was within the normal range. There was  no evidence for stenosis. There was mild regurgitation.    Peak gradient (D): 3 mm Hg.  ------------------------------------------------------------------- Left atrium:  The atrium was mildly dilated.  ------------------------------------------------------------------- Right ventricle:  The cavity size was moderately dilated. Wall thickness was normal. Systolic function was normal. Systolic pressure was increased slightly due to mild pulmonic valve homograft stenosis.  ------------------------------------------------------------------- Ventricular septum:   Septal motion showed paradox.  ------------------------------------------------------------------- Pulmonic valve:   A pulmonic root homograft was present.  Doppler:  The findings are consistent with mild stenosis.   There was no significant regurgitation. Peak gradient (S): 22 mm Hg.  ------------------------------------------------------------------- Tricuspid valve:   Structurally normal valve.    Doppler: Transvalvular velocity was within the normal range. There was mild regurgitation.  ------------------------------------------------------------------- Pulmonary artery:   The main pulmonary artery was normal-sized. Systolic pressure was within the normal  range.  ------------------------------------------------------------------- Right atrium:  The atrium was mildly dilated.  ------------------------------------------------------------------- Pericardium:  There was no pericardial effusion.  ------------------------------------------------------------------- Systemic veins: Inferior vena cava: The vessel was normal in size.  ------------------------------------------------------------------- Post procedure conclusions Ascending Aorta:  - The aorta was poorly visualized.  ------------------------------------------------------------------- Measurements   Left ventricle                             Value         Reference  LV ID, ED, PLAX chordal            (H)     57.7   mm     43 - 52  LV ID, ES, PLAX chordal            (H)     40.1   mm     23 - 38  LV fx shortening, PLAX chordal             31     %      >=29  LV PW thickness, ED                        10.4   mm     ---------  IVS/LV PW ratio, ED                        0.88          <=1.3  LV e&', lateral                             13.5   cm/s   ---------  LV E/e&', lateral                           5.9           ---------  LV e&', medial                              4.47   cm/s   ---------  LV E/e&', medial                            17.83         ---------  LV e&', average  8.99   cm/s   ---------  LV E/e&', average                           8.87          ---------    Ventricular septum                         Value         Reference  IVS thickness, ED                          9.13   mm     ---------    LVOT                                       Value         Reference  LVOT peak velocity, S                      86.4   cm/s   ---------  LVOT mean velocity, S                      52.7   cm/s   ---------  LVOT VTI, S                                18.5   cm     ---------    Aortic valve                               Value         Reference   Aortic valve peak velocity, S              105    cm/s   ---------  Aortic valve mean velocity, S              69     cm/s   ---------  Aortic valve VTI, S                        21.6   cm     ---------  Aortic mean gradient, S                    2      mm Hg  ---------  Aortic peak gradient, S                    4      mm Hg  ---------  VTI ratio, LVOT/AV                         0.86          ---------  Velocity ratio, peak, LVOT/AV              0.82          ---------  Velocity ratio, mean, LVOT/AV              0.76          ---------  Aortic regurg pressure half-time  581    ms     ---------    Aorta                                      Value         Reference  Aortic root ID, ED                         46     mm     ---------  Ascending aorta ID, A-P, S                 49     mm     ---------    Left atrium                                Value         Reference  LA ID, A-P, ES                             44.85  mm     ---------  LA ID/bsa, A-P                             1.9    cm/m^2 <=2.2  LA volume, S                               57     ml     ---------  LA volume/bsa, S                           24.1   ml/m^2 ---------  LA volume, ES, 1-p A4C                     50     ml     ---------  LA volume/bsa, ES, 1-p A4C                 21.2   ml/m^2 ---------  LA volume, ES, 1-p A2C                     62     ml     ---------  LA volume/bsa, ES, 1-p A2C                 26.2   ml/m^2 ---------    Mitral valve                               Value         Reference  Mitral E-wave peak velocity                79.7   cm/s   ---------  Mitral A-wave peak velocity                55.9   cm/s   ---------  Mitral deceleration time                   218    ms     150 - 230  Mitral peak gradient, D                    3      mm Hg  ---------  Mitral E/A ratio, peak                     1.4           ---------    Pulmonary arteries                         Value         Reference  PA  pressure, S, DP                         16     mm Hg  <=30    Tricuspid valve                            Value         Reference  Tricuspid regurg peak velocity             274.64 cm/s   ---------  Tricuspid peak RV-RA gradient              30     mm Hg  ---------  Tricuspid maximal regurg velocity,         274.64 cm/s   ---------  PISA    Systemic veins                             Value         Reference  Estimated CVP                              8      mm Hg  ---------    Right ventricle                            Value         Reference  RV ID, minor axis, ED, A4C         (H)     47.38  mm     26 - 43  RV pressure, S, DP                 (H)     38     mm Hg  <=30  RV s&', lateral, S                          12.5   cm/s   ---------    Pulmonic valve                             Value         Reference  Pulmonic valve peak velocity, S            236    cm/s   ---------  Pulmonic peak gradient, S                  22     mm Hg  ---------  Legend: (L)  and  (H)  mark values outside specified reference  range.  ------------------------------------------------------------------- Prepared and Electronically Authenticated by  Sanda Klein, MD 2017-08-10T17:21:16   Recent Lab Findings: Lab Results  Component Value Date   WBC 8.3 12/22/2018   HGB 14.7 12/22/2018   HCT 43.3 12/22/2018   PLT 253.0 12/22/2018   GLUCOSE 121 (H) 12/22/2018   CHOL 118 12/22/2018   TRIG 92.0 12/22/2018   HDL 39.90 12/22/2018   LDLDIRECT 69.8 05/31/2014   LDLCALC 60 12/22/2018   ALT 22 12/22/2018   AST 20 12/22/2018   NA 137 12/22/2018   K 4.7 12/22/2018   CL 104 12/22/2018   CREATININE 0.81 12/22/2018   BUN 12 12/22/2018   CO2 25 12/22/2018   TSH 2.44 12/22/2018   HGBA1C 6.7 (H) 12/23/2018      Assessment / Plan:   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    I would not recommend repeat surgery at this time without  significant aortic insufficiency.  The homograft root appears to be unchanged in size on all the scans that are available, and does not appear to be progressing the enlarging.  Echo cardiogram reports back to 2002 noted and aortic root of 5.1 cm   I discussed with him endocarditis precautions   and avoiding weight lifting [employer] Shown   Patient also has incidental note of an elevated left diaphragm, this is not commented on an old chest x-ray reports, but as noted on previous MRI scans. 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  Plan to see back in 8 months with a cardiac CT /for root morphology  Grace Isaac MD      [address] [place] [address] Office [phone]   Beeper [phone]  01/31/2019 9:34 AM

## 2019-02-10 ENCOUNTER — Other Ambulatory Visit: Payer: Self-pay

## 2019-02-10 ENCOUNTER — Ambulatory Visit (HOSPITAL_COMMUNITY)
Admission: RE | Admit: 2019-02-10 | Discharge: 2019-02-10 | Disposition: A | Discharge status: Home / Self Care | Payer: Medicare Other | Source: Ambulatory Visit | Attending: Cardiology | Admitting: Cardiology

## 2019-02-10 ENCOUNTER — Encounter (HOSPITAL_COMMUNITY): Payer: Self-pay | Admitting: Cardiology

## 2019-02-10 VITALS — BP 110/68 | HR 71 | Wt 234.6 lb

## 2019-02-10 DIAGNOSIS — Z7982 Long term (current) use of aspirin: Secondary | ICD-10-CM | POA: Insufficient documentation

## 2019-02-10 DIAGNOSIS — Z7951 Long term (current) use of inhaled steroids: Secondary | ICD-10-CM | POA: Diagnosis not present

## 2019-02-10 DIAGNOSIS — N400 Enlarged prostate without lower urinary tract symptoms: Secondary | ICD-10-CM | POA: Diagnosis not present

## 2019-02-10 DIAGNOSIS — Z79899 Other long term (current) drug therapy: Secondary | ICD-10-CM | POA: Diagnosis not present

## 2019-02-10 DIAGNOSIS — I719 Aortic aneurysm of unspecified site, without rupture: Secondary | ICD-10-CM | POA: Diagnosis not present

## 2019-02-10 DIAGNOSIS — Z953 Presence of xenogenic heart valve: Secondary | ICD-10-CM | POA: Insufficient documentation

## 2019-02-10 DIAGNOSIS — E785 Hyperlipidemia, unspecified: Secondary | ICD-10-CM | POA: Diagnosis not present

## 2019-02-10 DIAGNOSIS — I10 Essential (primary) hypertension: Secondary | ICD-10-CM

## 2019-02-10 DIAGNOSIS — Z8249 Family history of ischemic heart disease and other diseases of the circulatory system: Secondary | ICD-10-CM | POA: Insufficient documentation

## 2019-02-10 DIAGNOSIS — I712 Thoracic aortic aneurysm, without rupture: Secondary | ICD-10-CM | POA: Insufficient documentation

## 2019-02-10 DIAGNOSIS — Z954 Presence of other heart-valve replacement: Secondary | ICD-10-CM | POA: Diagnosis not present

## 2019-02-10 DIAGNOSIS — Z8042 Family history of malignant neoplasm of prostate: Secondary | ICD-10-CM | POA: Insufficient documentation

## 2019-02-10 NOTE — Patient Instructions (Signed)
Your physician recommends that you schedule a follow-up appointment in: 6 months. We will contact you about a appointment date and time.  Your physician has requested that you have an echocardiogram. Echocardiography is a painless test that uses sound waves to create images of your heart. It provides your doctor with information about the size and shape of your heart and how well your heart's chambers and valves are working. This procedure takes approximately one hour. There are no restrictions for this procedure. Someone will contact you to schedule an appointment.   At the [facility], you and your health needs are our priority. As part of our continuing mission to provide you with exceptional heart care, we have created designated Provider Care Teams. These Care Teams include your primary Cardiologist (physician) and Advanced Practice Providers (APPs- Physician Assistants and Nurse Practitioners) who all work together to provide you with the care you need, when you need it.   You may see any of the following providers on your designated Care Team at your next follow up: Marland Kitchen Dr Glori Bickers . Dr Loralie Champagne . Darrick Grinder, NP

## 2019-02-13 NOTE — Progress Notes (Signed)
Patient ID: [name] [name], male   DOB: 1951/04/15, 67 y.o.   MRN: [id number] PCP: Dr. Birdie Riddle Cardiology: Dr. Aundra Dubin  68 y.o. with history of aortic insufficiency s/p Ross procedure and aortic root aneurysm presents for followup of Ross procedure.  He has a left-sided brachial plexopathy of uncertain etiology and has atrophy of his left shoulder and upper arm.  He developed weakness in his right hand as well as occasional vertigo-type spells.  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    Most recent echo in 8/18 showed no regurgitation or stenosis of autograft in aortic position, bioprosthetic pulmonic valve with mild pulmonic stenosis, mean gradient 10 mmHg.  EF was read as 40-45%, which is lower than in the past.  MRA chest 3/19 showed 5.7 cm dilation of pulmonary autograft at level of sinuses of Valsalva, MRA chest in 6/20 showed 6 cm dilation of the pulmonary autograft at the level of the sinuses of valsalva.  He is being followed for aortic root aneurysm by Dr. Servando Snare.   He has been stable symptomatically.  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 No chest pain.  No problems walking up a flight of stairs or taking a walk on flat ground.  No lightheadedness.  BP is controlled.   ECG (personally reviewed): NSR, 1st AVB, PACs  Labs (2/14): K 3.7, creatinine 0.9, LDL 63, HDL 29 Labs (3/14): BNP 36 Labs (2/15): K 3.7, creatinine 0.8, LDL 61, HDL 30 Labs (4/16): K 3.7, creatinine 0.8, LDL 51, HDL 34 Labs (5/16): HCT 44.9 Labs (11/17): LDL 74, HDL 40, K 4.1, creatinine 0.77 Labs (11/18): LDL 75, HDL 35 Labs (2/19): K 4.2, creatinine 0.88 Labs (5/20): LDL 60, K 4.7, creatinine 0.81  PMH: 1. Aortic insufficiency: s/p  Ross procedure in 1994 in [place].   - Echo (4/11) with hypokinetic basal septum (likely post-surgical), EF 50%, mild LVH, s/p Ross procedure with native pulmonic valve in aortic position with mild aortic insufficiency and bioprosthetic pulmonic valve with no pulmonic insufficiency, mild MR, aortic upper normal in size.  - Echo (4/13) with EF 55%, mild LVH, mild AI, mild MR, mild RV dilation, bioprosthetic pulmonic valve with peak pressure 25 mmHg.   - Echo (3/14): Mild LV dilation, mild LVH, EF 60%, pulmonary valve in aortic position (s/p Ross) with mild AI and mildly dilated ascending aorta to 4.3 cm, mildly dilated RV with normal systolic function, bioprosthetic PVR with mean gradient 23 mmHg.  Echo (3/15) with EF 55-60%, mild LVH, mild AI with no AS, bioprosthetic pulmonic valve with peak gradient 31 mmHg, ascending aorta 4.4 cm.   - Cardiac MRI/MRA chest (5/15) with mild regurgitation of autograft aortic valve and mild regurgitation of homograft pulmonic valve, dilation of aortic root at sinuses of valsalva (4.8 cm), EF 60%, no LGE, mild RV dilation with normal RV systolic function.   - Echo (5/16) with EF 55-60%, mild autograft AI, mild MR, mildly decreased RV systolic function, biatrial enlargement, RVSP 35 mmHg, trivially increased gradient across bioprosthetic pulmonic valve (no progression).  - Echo (8/17): EF 55-60%, pulmonary autograft in aortic valve position, mild AI, bioprosthetic pulmonary valve with mild PS (peak 22 mmHg), moderate RV dilation with normal systolic function.  -  Echo (8/18): EF 40-45%, mildly increased gradient across bioprosthetic pulmonary valve (mean 10 mmHg), s/p Ross procedure with no stenosis or significant regurgitation of pulmonary autograft in aortic position.  2. Post-operative atrial fibrillation after heart surgery.  3. Normal left heart cath prior to 1994 heart surgery.  4. Left brachial plexopathy with left shoulder muscle atrophy.  Uncertain etiology.  5.  BPH 6. Hyperlipidemia.  7. Cervical spinal stenosis with radiculopathy.   8. 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  9. ETT-Sestamibi (3/14): No ischemia or infarction.  10. Carotid dopplers (6/14): mild disease only.  11. Sinus of valsalva aneurysm: MRA chest 2/18 with dilation of pulmonary autograft at level of sinuses of Valsalva to 5.4 cm when measured into left cusp.  - MRA chest (3/19): Saccular dilatation of the left and right sinuses of valsalva, 5.7 cm aortic root diameter.   - MRA chest (6/20): 6 cm dilated sinuses of valsalva, 4.6 cm ascending aorta.  12. Elevated left hemidiaphragm.   FH: Prostate CA, HTN  SH: Lives in [place], owns a window washing business, married, nonsmoker.   ROS: All systems reviewed and negative except as per HPI.    Current Outpatient Medications  Medication Sig Dispense Refill  . aspirin 81 MG chewable tablet Chew 81 mg by mouth daily.      . cetirizine (ZYRTEC) 10 MG tablet TAKE 1 TABLET(10 MG) BY MOUTH DAILY 90 tablet 3  . finasteride (PROSCAR) 5 MG tablet TAKE 1 TABLET(5 MG) BY MOUTH DAILY 90 tablet 1  . fluticasone (FLONASE) 50 MCG/ACT nasal spray SHAKE LIQUID AND USE 2 SPRAYS IN EACH NOSTRIL DAILY 16 g 6  . metoprolol succinate (TOPROL-XL) 100 MG 24 hr tablet TAKE 1/2 TABLET BY MOUTH EVERY MORNING AND 1 TABLET BY MOUTH EVERY EVENING(APPOINTMENT NEEDED FOR REFILLS) 135 tablet 0  . simvastatin (ZOCOR) 20 MG tablet TAKE 1 TABLET(20 MG) BY MOUTH DAILY 90 tablet 1  . telmisartan (MICARDIS) 80 MG tablet TAKE 1 TABLET(80 MG) BY MOUTH DAILY 90 tablet 1  . VIAGRA 100 MG tablet      No current facility-administered medications for this encounter.     BP 110/68   Pulse 71   Wt 106.4 kg (234 lb 9.6 oz)   SpO2 97%   BMI 29.32 kg/m  General: NAD Neck: No JVD, no thyromegaly or thyroid nodule.  Lungs: Clear to auscultation bilaterally with normal respiratory effort. CV:  Nondisplaced PMI.  Heart regular S1/S2 with widely split S2, no S3/S4, 2/6 SEM RUSB.  No peripheral edema.  No carotid bruit.  Normal pedal pulses.  Abdomen: Soft, nontender, no hepatosplenomegaly, no distention.  Skin: Intact without lesions or rashes.  Neurologic: Alert and oriented x 3.  Psych: Normal affect. Extremities: No clubbing or cyanosis.  HEENT: Normal.   Assessment/Plan: 1.  History of Ross Procedure: No significant AI or AS and mildly elevated bioprosthetic pulmonic valve gradient on echo in 8/18, stable.  No exertional dyspnea.  EF was read as low at 40-45% on 8/18 echo, I am not sure this is completely accurate (echo quality not as good).  - Repeat echo to reassess LV systolic function and also degree of aortic insufficiency with dilation of the pulmonary autograft .   - Needs endocarditis prophylaxis with dental work.   2.  Aortic root aneurym: This not uncommonly accompanies Ross procedure.  The pulmonary autograft at sinuses of valsalva measured 6.0 cm by MRA in  6/20.  This was reviewed by Dr. Servando Snare and thought to have been stable over the last few years.   - Per Dr. Servando Snare, would hold off on elective repair unless there is significant AI.   - Continue blood pressure control, goal SBP 120s or lower.  3. HTN: Given aneurysm, need to make sure that BP is well-controlled. It is well-controlled today.  4. Hyperlipidemia: Lipids acceptable when recently checked.   Followup in 6 months if echo stable.   Loralie Champagne 02/13/2019

## 2019-02-21 ENCOUNTER — Ambulatory Visit (HOSPITAL_COMMUNITY): Payer: Medicare Other | Attending: Cardiology

## 2019-02-21 ENCOUNTER — Other Ambulatory Visit: Payer: Self-pay

## 2019-02-21 DIAGNOSIS — Z954 Presence of other heart-valve replacement: Secondary | ICD-10-CM | POA: Diagnosis present

## 2019-02-23 ENCOUNTER — Telehealth (HOSPITAL_COMMUNITY): Payer: Self-pay

## 2019-02-23 DIAGNOSIS — I359 Nonrheumatic aortic valve disorder, unspecified: Secondary | ICD-10-CM

## 2019-02-23 DIAGNOSIS — I351 Nonrheumatic aortic (valve) insufficiency: Secondary | ICD-10-CM

## 2019-02-23 NOTE — Telephone Encounter (Signed)
-----   Message from [name], MD sent at 02/22/2019  8:55 PM EDT ----- Per Dr. Servando Snare, we will follow more closely via echo.  Repeat echo should be scheduled for 6 months.

## 2019-02-23 NOTE — Telephone Encounter (Signed)
Echo ordered for 6 months as ordered

## 2019-03-09 ENCOUNTER — Other Ambulatory Visit: Payer: Self-pay | Admitting: Family Medicine

## 2019-04-10 ENCOUNTER — Other Ambulatory Visit: Payer: Self-pay | Admitting: Family Medicine

## 2019-04-28 ENCOUNTER — Other Ambulatory Visit (HOSPITAL_COMMUNITY): Payer: Self-pay | Admitting: Cardiology

## 2019-05-04 ENCOUNTER — Other Ambulatory Visit: Payer: Self-pay | Admitting: General Practice

## 2019-05-04 MED ORDER — CETIRIZINE HCL 10 MG PO TABS: ORAL_TABLET | ORAL | 3 refills | Status: DC

## 2019-06-10 ENCOUNTER — Other Ambulatory Visit: Payer: Self-pay | Admitting: General Practice

## 2019-06-10 DIAGNOSIS — J329 Chronic sinusitis, unspecified: Secondary | ICD-10-CM

## 2019-06-10 DIAGNOSIS — B9789 Other viral agents as the cause of diseases classified elsewhere: Secondary | ICD-10-CM

## 2019-06-10 MED ORDER — FLUTICASONE PROPIONATE 50 MCG/ACT NA SUSP: NASAL | 6 refills | Status: DC

## 2019-06-16 ENCOUNTER — Other Ambulatory Visit: Payer: Self-pay

## 2019-06-16 ENCOUNTER — Ambulatory Visit (INDEPENDENT_AMBULATORY_CARE_PROVIDER_SITE_OTHER): Payer: Medicare Other | Admitting: Family Medicine

## 2019-06-16 ENCOUNTER — Encounter: Payer: Self-pay | Admitting: Family Medicine

## 2019-06-16 VITALS — BP 126/78 | HR 79 | Temp 98.1°F | Resp 17 | Ht 75.0 in | Wt 241.0 lb

## 2019-06-16 DIAGNOSIS — Z23 Encounter for immunization: Secondary | ICD-10-CM | POA: Diagnosis not present

## 2019-06-16 DIAGNOSIS — E669 Obesity, unspecified: Secondary | ICD-10-CM | POA: Diagnosis not present

## 2019-06-16 DIAGNOSIS — Z0000 Encounter for general adult medical examination without abnormal findings: Secondary | ICD-10-CM

## 2019-06-16 DIAGNOSIS — I10 Essential (primary) hypertension: Secondary | ICD-10-CM | POA: Diagnosis not present

## 2019-06-16 DIAGNOSIS — Z125 Encounter for screening for malignant neoplasm of prostate: Secondary | ICD-10-CM | POA: Diagnosis not present

## 2019-06-16 DIAGNOSIS — E785 Hyperlipidemia, unspecified: Secondary | ICD-10-CM | POA: Diagnosis not present

## 2019-06-16 LAB — CBC WITH DIFFERENTIAL/PLATELET
Basophils Absolute: 0 10*3/uL (ref 0.0–0.1)
Basophils Relative: 0.5 % (ref 0.0–3.0)
Eosinophils Absolute: 0.2 10*3/uL (ref 0.0–0.7)
Eosinophils Relative: 2.6 % (ref 0.0–5.0)
HCT: 46.7 % (ref 39.0–52.0)
Hemoglobin: 15.9 g/dL (ref 13.0–17.0)
Lymphocytes Relative: 21 % (ref 12.0–46.0)
Lymphs Abs: 1.9 10*3/uL (ref 0.7–4.0)
MCHC: 34 g/dL (ref 30.0–36.0)
MCV: 92 fl (ref 78.0–100.0)
Monocytes Absolute: 0.8 10*3/uL (ref 0.1–1.0)
Monocytes Relative: 9.4 % (ref 3.0–12.0)
Neutro Abs: 5.9 10*3/uL (ref 1.4–7.7)
Neutrophils Relative %: 66.5 % (ref 43.0–77.0)
Platelets: 169 10*3/uL (ref 150.0–400.0)
RBC: 5.08 Mil/uL (ref 4.22–5.81)
RDW: 13.7 % (ref 11.5–15.5)
WBC: 8.8 10*3/uL (ref 4.0–10.5)

## 2019-06-16 LAB — BASIC METABOLIC PANEL
BUN: 13 mg/dL (ref 6–23)
CO2: 23 mEq/L (ref 19–32)
Calcium: 9.1 mg/dL (ref 8.4–10.5)
Chloride: 105 mEq/L (ref 96–112)
Creatinine, Ser: 0.73 mg/dL (ref 0.40–1.50)
GFR: 107.2 mL/min (ref >60.00)
Glucose, Bld: 121 mg/dL — ABNORMAL HIGH (ref 70–99)
Potassium: 4.5 mEq/L (ref 3.5–5.1)
Sodium: 138 mEq/L (ref 135–145)

## 2019-06-16 LAB — LIPID PANEL
Cholesterol: 144 mg/dL (ref 0–200)
HDL: 39.5 mg/dL (ref >39.00)
LDL Cholesterol: 78 mg/dL (ref 0–99)
NonHDL: 104.02
Total CHOL/HDL Ratio: 4
Triglycerides: 128 mg/dL (ref 0.0–149.0)
VLDL: 25.6 mg/dL (ref 0.0–40.0)

## 2019-06-16 LAB — HEPATIC FUNCTION PANEL
ALT: 23 U/L (ref 0–53)
AST: 19 U/L (ref 0–37)
Albumin: 4.4 g/dL (ref 3.5–5.2)
Alkaline Phosphatase: 66 U/L (ref 39–117)
Bilirubin, Direct: 0.2 mg/dL (ref 0.0–0.3)
Total Bilirubin: 1.5 mg/dL — ABNORMAL HIGH (ref 0.2–1.2)
Total Protein: 6.8 g/dL (ref 6.0–8.3)

## 2019-06-16 LAB — TSH: TSH: 2.08 u[IU]/mL (ref 0.35–4.50)

## 2019-06-16 LAB — PSA, MEDICARE: PSA: 2.11 ng/ml (ref 0.10–4.00)

## 2019-06-16 NOTE — Progress Notes (Signed)
[name] [name] 68 y.o. male presents to office today for his annual CPE. Administered PNEUMOVAX-23 0.5 mL IM right arm per Annye Asa, MD. Patient tolerated well.

## 2019-06-16 NOTE — Addendum Note (Signed)
Addended by: Doran Clay A on: 06/16/2019 11:14 AM   Modules accepted: Orders

## 2019-06-16 NOTE — Progress Notes (Signed)
   Subjective:    Patient ID: [name] [name], male    DOB: [birth year], 68 y.o.   MRN: [id number]  HPI Here today for CPE.  Risk Factors: HTN- chronic problem, on Metoprolol XL 50mg  qAM and 100mg  qPM and Telmisartan 80mg  daily Hyperlipidemia- chronic problem, on Simvastatin. Obesity- pt has gained 11 lbs since last visit, BMI is now 30.12.  Reports he has spent more time house renovating rather than exercise.  Is now working on diet. Physical Activity: walking regularly Fall Risk: low Depression: denies sxs Hearing: normal to conversational tones, decreased to whispered ADL's: Independent Cognitive: normal linear thought process, memory and attention intact Home Safety: safe at home, lives w/ wife Height, Weight, BMI, Visual Acuity: see vitals, vision corrected to 20/20 w/ glasses Counseling: UTD on colonoscopy, due for flu, Pneumovax, and PSA Labs Ordered: See A&P Care Plan: See A&P   Patient Care Team    Relationship Specialty Notifications Start End  [name], MD PCP - General   07/16/10    Comment: Soledad Gerlach, MD Consulting Physician Cardiology  03/14/15   Grace Isaac, MD Consulting Physician Cardiothoracic Surgery  06/03/15   Rana Snare, MD Consulting Physician Urology  06/03/15   Devra Dopp, MD Referring Physician Dermatology  06/10/17   Melissa Montane, MD Consulting Physician Otolaryngology  06/10/17       Review of Systems Patient reports no vision/hearing changes, anorexia, fever ,adenopathy, persistant/recurrent hoarseness, swallowing issues, chest pain, palpitations, edema, persistant/recurrent cough, hemoptysis, dyspnea (rest,exertional, paroxysmal nocturnal), gastrointestinal  bleeding (melena, rectal bleeding), abdominal pain, excessive heart burn, GU symptoms (dysuria, hematuria, voiding/incontinence issues) syncope, focal weakness, memory loss, numbness & tingling, skin/hair/nail changes, depression, anxiety, abnormal  bruising/bleeding, musculoskeletal symptoms/signs.     Objective:   Physical Exam General Appearance:    Alert, cooperative, no distress, appears stated age  Head:    Normocephalic, without obvious abnormality, atraumatic  Eyes:    PERRL, conjunctiva/corneas clear, EOM's intact, fundi    benign, both eyes       Ears:    Normal TM's and external ear canals, both ears  Nose:   Deferred due to COVID  Throat:   Neck:   Supple, symmetrical, trachea midline, no adenopathy;       thyroid:  No enlargement/tenderness/nodules  Back:     Symmetric, no curvature, ROM normal, no CVA tenderness  Lungs:     Clear to auscultation bilaterally, respirations unlabored  Chest wall:    No tenderness or deformity  Heart:    Regular rate and rhythm, S1 and S2 normal, mechanical click , no rub or gallop  Abdomen:     Soft, non-tender, bowel sounds active all four quadrants,    no masses, no organomegaly  Genitalia:    Deferred to urology  Rectal:    Extremities:   Extremities normal, atraumatic, no cyanosis or edema  Pulses:   2+ and symmetric all extremities  Skin:   Skin color, texture, turgor normal, no rashes or lesions  Lymph nodes:   Cervical, supraclavicular, and axillary nodes normal  Neurologic:   CNII-XII intact. Normal strength, sensation and reflexes      throughout          Assessment & Plan:

## 2019-06-16 NOTE — Assessment & Plan Note (Signed)
New.  Pt's 11 lb weight gain now places BMI in obese range.  Encouraged healthy diet and regular exercise.  Will follow.

## 2019-06-16 NOTE — Assessment & Plan Note (Signed)
Pt's PE unchanged from previous.  UTD on colonoscopy.  Flu and Pneumovax given today.  Check labs.  Anticipatory guidance provided.

## 2019-06-16 NOTE — Patient Instructions (Signed)
Follow up in 6 months to recheck BP and cholesterol We'll notify you of your lab results and make any changes if needed Continue to work on healthy diet and regular exercise- you can do it! Call with any questions or concerns Stay Safe! Stay Healthy!   Preventive Care 67 Years and Older, Male Preventive care refers to lifestyle choices and visits with your health care provider that can promote health and wellness. This includes:  A yearly physical exam. This is also called an annual well check.  Regular dental and eye exams.  Immunizations.  Screening for certain conditions.  Healthy lifestyle choices, such as diet and exercise. What can I expect for my preventive care visit? Physical exam Your health care provider will check:  Height and weight. These may be used to calculate body mass index (BMI), which is a measurement that tells if you are at a healthy weight.  Heart rate and blood pressure.  Your skin for abnormal spots. Counseling Your health care provider may ask you questions about:  Alcohol, tobacco, and drug use.  Emotional well-being.  Home and relationship well-being.  Sexual activity.  Eating habits.  History of falls.  Memory and ability to understand (cognition).  Work and work Statistician. What immunizations do I need?  Influenza (flu) vaccine  This is recommended every year. Tetanus, diphtheria, and pertussis (Tdap) vaccine  You may need a Td booster every 10 years. Varicella (chickenpox) vaccine  You may need this vaccine if you have not already been vaccinated. Zoster (shingles) vaccine  You may need this after age 8. Pneumococcal conjugate (PCV13) vaccine  One dose is recommended after age 6. Pneumococcal polysaccharide (PPSV23) vaccine  One dose is recommended after age 34. Measles, mumps, and rubella (MMR) vaccine  You may need at least one dose of MMR if you were born in 1957 or later. You may also need a second dose.  Meningococcal conjugate (MenACWY) vaccine  You may need this if you have certain conditions. Hepatitis A vaccine  You may need this if you have certain conditions or if you travel or work in places where you may be exposed to hepatitis A. Hepatitis B vaccine  You may need this if you have certain conditions or if you travel or work in places where you may be exposed to hepatitis B. Haemophilus influenzae type b (Hib) vaccine  You may need this if you have certain conditions. You may receive vaccines as individual doses or as more than one vaccine together in one shot (combination vaccines). Talk with your health care provider about the risks and benefits of combination vaccines. What tests do I need? Blood tests  Lipid and cholesterol levels. These may be checked every 5 years, or more frequently depending on your overall health.  Hepatitis C test.  Hepatitis B test. Screening  Lung cancer screening. You may have this screening every year starting at age 58 if you have a 30-pack-year history of smoking and currently smoke or have quit within the past 15 years.  Colorectal cancer screening. All adults should have this screening starting at age 66 and continuing until age 70. Your health care provider may recommend screening at age 47 if you are at increased risk. You will have tests every 1-10 years, depending on your results and the type of screening test.  Prostate cancer screening. Recommendations will vary depending on your family history and other risks.  Diabetes screening. This is done by checking your blood sugar (glucose) after you have  not eaten for a while (fasting). You may have this done every 1-3 years.  Abdominal aortic aneurysm (AAA) screening. You may need this if you are a current or former smoker.  Sexually transmitted disease (STD) testing. Follow these instructions at home: Eating and drinking  Eat a diet that includes fresh fruits and vegetables, whole  grains, lean protein, and low-fat dairy products. Limit your intake of foods with high amounts of sugar, saturated fats, and salt.  Take vitamin and mineral supplements as recommended by your health care provider.  Do not drink alcohol if your health care provider tells you not to drink.  If you drink alcohol: ? Limit how much you have to 0-2 drinks a day. ? Be aware of how much alcohol is in your drink. In the [place] one drink equals one 12 oz bottle of beer (355 mL), one 5 oz glass of wine (148 mL), or one 1 oz glass of hard liquor (44 mL). Lifestyle  Take daily care of your teeth and gums.  Stay active. Exercise for at least 30 minutes on 5 or more days each week.  Do not use any products that contain nicotine or tobacco, such as cigarettes, e-cigarettes, and chewing tobacco. If you need help quitting, ask your health care provider.  If you are sexually active, practice safe sex. Use a condom or other form of protection to prevent STIs (sexually transmitted infections).  Talk with your health care provider about taking a low-dose aspirin or statin. What's next?  Visit your health care provider once a year for a well check visit.  Ask your health care provider how often you should have your eyes and teeth checked.  Stay up to date on all vaccines. This information is not intended to replace advice given to you by your health care provider. Make sure you discuss any questions you have with your health care provider. Document Released: 08/16/2015 Document Revised: 07/14/2018 Document Reviewed: 07/14/2018 Elsevier Patient Education  2020 [employer].

## 2019-06-16 NOTE — Assessment & Plan Note (Signed)
Chronic problem.  Well controlled today.  Asymptomatic.  Check labs.  No anticipated med changes.  Will follow. 

## 2019-06-16 NOTE — Assessment & Plan Note (Signed)
Chronic problem.  Tolerating statin w/o difficulty.  Check labs.  Adjust meds prn  

## 2019-06-19 ENCOUNTER — Other Ambulatory Visit (INDEPENDENT_AMBULATORY_CARE_PROVIDER_SITE_OTHER): Payer: Medicare Other

## 2019-06-19 DIAGNOSIS — E119 Type 2 diabetes mellitus without complications: Secondary | ICD-10-CM | POA: Diagnosis not present

## 2019-06-19 LAB — HEMOGLOBIN A1C: Hgb A1c MFr Bld: 6.5 % (ref 4.6–6.5)

## 2019-07-10 ENCOUNTER — Other Ambulatory Visit: Payer: Self-pay | Admitting: General Practice

## 2019-07-10 MED ORDER — SIMVASTATIN 20 MG PO TABS: ORAL_TABLET | ORAL | 1 refills | Status: DC

## 2019-07-27 ENCOUNTER — Other Ambulatory Visit (HOSPITAL_COMMUNITY): Payer: Self-pay

## 2019-07-27 MED ORDER — METOPROLOL SUCCINATE ER 100 MG PO TB24: ORAL_TABLET | ORAL | 0 refills | Status: DC

## 2019-09-05 ENCOUNTER — Other Ambulatory Visit: Payer: Self-pay | Admitting: General Practice

## 2019-09-05 MED ORDER — FINASTERIDE 5 MG PO TABS: 5.0000 mg | ORAL_TABLET | Freq: Every day | ORAL | 1 refills | Status: DC

## 2019-09-21 ENCOUNTER — Ambulatory Visit: Payer: Self-pay

## 2019-09-25 ENCOUNTER — Ambulatory Visit: Payer: Medicare Other | Attending: Family

## 2019-09-25 DIAGNOSIS — Z23 Encounter for immunization: Secondary | ICD-10-CM | POA: Insufficient documentation

## 2019-09-25 NOTE — Progress Notes (Signed)
   Covid-19 Vaccination Clinic  Name:  [name] [name]    MRN: [id number] DOB: 1951/03/06  09/25/2019  [name] was observed post Covid-19 immunization for 15 minutes without incidence. He was provided with Vaccine Information Sheet and instruction to access the V-Safe system.   [name] was instructed to call 911 with any severe reactions post vaccine: Marland Kitchen Difficulty breathing  . Swelling of your face and throat  . A fast heartbeat  . A bad rash all over your body  . Dizziness and weakness    Immunizations Administered    Name Date Dose VIS Date Route   Moderna COVID-19 Vaccine 09/25/2019 12:12 PM 0.5 mL 07/04/2019 Intramuscular   Manufacturer: Moderna   Lot: [id number]   [id number]

## 2019-10-09 ENCOUNTER — Other Ambulatory Visit: Payer: Self-pay | Admitting: General Practice

## 2019-10-09 MED ORDER — TELMISARTAN 80 MG PO TABS: 80.0000 mg | ORAL_TABLET | Freq: Every day | ORAL | 1 refills | Status: DC

## 2019-10-24 ENCOUNTER — Ambulatory Visit: Payer: Medicare Other | Attending: Family

## 2019-10-24 DIAGNOSIS — Z23 Encounter for immunization: Secondary | ICD-10-CM

## 2019-10-24 NOTE — Progress Notes (Signed)
   Covid-19 Vaccination Clinic  Name:  [name] [name]    MRN: [id number] DOB: Sep 05, 1950  10/24/2019  [name] was observed post Covid-19 immunization for 15 minutes without incident. He was provided with Vaccine Information Sheet and instruction to access the V-Safe system.   [name] was instructed to call 911 with any severe reactions post vaccine: Marland Kitchen Difficulty breathing  . Swelling of face and throat  . A fast heartbeat  . A bad rash all over body  . Dizziness and weakness   Immunizations Administered    Name Date Dose VIS Date Route   Moderna COVID-19 Vaccine 10/24/2019  3:38 PM 0.5 mL 07/04/2019 Intramuscular   Manufacturer: Moderna   LotMV:[id number]   [place][id number]

## 2019-12-14 ENCOUNTER — Encounter: Payer: Self-pay | Admitting: Family Medicine

## 2019-12-14 ENCOUNTER — Ambulatory Visit: Payer: Medicare Other | Admitting: Family Medicine

## 2019-12-14 ENCOUNTER — Other Ambulatory Visit: Payer: Self-pay

## 2019-12-14 VITALS — BP 124/76 | HR 111 | Temp 97.5°F | Resp 16 | Ht 75.0 in | Wt 243.0 lb

## 2019-12-14 DIAGNOSIS — R7303 Prediabetes: Secondary | ICD-10-CM

## 2019-12-14 DIAGNOSIS — I10 Essential (primary) hypertension: Secondary | ICD-10-CM | POA: Diagnosis not present

## 2019-12-14 DIAGNOSIS — E785 Hyperlipidemia, unspecified: Secondary | ICD-10-CM | POA: Diagnosis not present

## 2019-12-14 DIAGNOSIS — E669 Obesity, unspecified: Secondary | ICD-10-CM

## 2019-12-14 DIAGNOSIS — K219 Gastro-esophageal reflux disease without esophagitis: Secondary | ICD-10-CM

## 2019-12-14 DIAGNOSIS — H6122 Impacted cerumen, left ear: Secondary | ICD-10-CM

## 2019-12-14 DIAGNOSIS — H6121 Impacted cerumen, right ear: Secondary | ICD-10-CM

## 2019-12-14 DIAGNOSIS — R140 Abdominal distension (gaseous): Secondary | ICD-10-CM | POA: Insufficient documentation

## 2019-12-14 DIAGNOSIS — E118 Type 2 diabetes mellitus with unspecified complications: Secondary | ICD-10-CM | POA: Insufficient documentation

## 2019-12-14 LAB — HEPATIC FUNCTION PANEL
ALT: 27 U/L (ref 0–53)
AST: 22 U/L (ref 0–37)
Albumin: 4.2 g/dL (ref 3.5–5.2)
Alkaline Phosphatase: 63 U/L (ref 39–117)
Bilirubin, Direct: 0.3 mg/dL (ref 0.0–0.3)
Total Bilirubin: 1.4 mg/dL — ABNORMAL HIGH (ref 0.2–1.2)
Total Protein: 6.5 g/dL (ref 6.0–8.3)

## 2019-12-14 LAB — CBC WITH DIFFERENTIAL/PLATELET
Basophils Absolute: 0 10*3/uL (ref 0.0–0.1)
Basophils Relative: 0.4 % (ref 0.0–3.0)
Eosinophils Absolute: 0.1 10*3/uL (ref 0.0–0.7)
Eosinophils Relative: 1.5 % (ref 0.0–5.0)
HCT: 43.1 % (ref 39.0–52.0)
Hemoglobin: 14.6 g/dL (ref 13.0–17.0)
Lymphocytes Relative: 21.7 % (ref 12.0–46.0)
Lymphs Abs: 1.5 10*3/uL (ref 0.7–4.0)
MCHC: 33.8 g/dL (ref 30.0–36.0)
MCV: 92.7 fl (ref 78.0–100.0)
Monocytes Absolute: 0.7 10*3/uL (ref 0.1–1.0)
Monocytes Relative: 10.4 % (ref 3.0–12.0)
Neutro Abs: 4.6 10*3/uL (ref 1.4–7.7)
Neutrophils Relative %: 66 % (ref 43.0–77.0)
Platelets: 236 10*3/uL (ref 150.0–400.0)
RBC: 4.64 Mil/uL (ref 4.22–5.81)
RDW: 13.3 % (ref 11.5–15.5)
WBC: 6.9 10*3/uL (ref 4.0–10.5)

## 2019-12-14 LAB — LIPID PANEL
Cholesterol: 131 mg/dL (ref 0–200)
HDL: 41.1 mg/dL (ref >39.00)
LDL Cholesterol: 70 mg/dL (ref 0–99)
NonHDL: 90.34
Total CHOL/HDL Ratio: 3
Triglycerides: 104 mg/dL (ref 0.0–149.0)
VLDL: 20.8 mg/dL (ref 0.0–40.0)

## 2019-12-14 LAB — BASIC METABOLIC PANEL
BUN: 15 mg/dL (ref 6–23)
CO2: 28 mEq/L (ref 19–32)
Calcium: 8.9 mg/dL (ref 8.4–10.5)
Chloride: 103 mEq/L (ref 96–112)
Creatinine, Ser: 0.78 mg/dL (ref 0.40–1.50)
GFR: 99.16 mL/min (ref >60.00)
Glucose, Bld: 133 mg/dL — ABNORMAL HIGH (ref 70–99)
Potassium: 4.7 mEq/L (ref 3.5–5.1)
Sodium: 136 mEq/L (ref 135–145)

## 2019-12-14 LAB — TSH: TSH: 1.61 u[IU]/mL (ref 0.35–4.50)

## 2019-12-14 LAB — HEMOGLOBIN A1C: Hgb A1c MFr Bld: 6.6 % — ABNORMAL HIGH (ref 4.6–6.5)

## 2019-12-14 MED ORDER — OMEPRAZOLE 40 MG PO CPDR: 40.0000 mg | DELAYED_RELEASE_CAPSULE | Freq: Every day | ORAL | 3 refills | Status: DC

## 2019-12-14 NOTE — Assessment & Plan Note (Signed)
Chronic problem.  Well controlled.  Asymptomatic.  Check labs.  No anticipated med changes.  Will follow. 

## 2019-12-14 NOTE — Assessment & Plan Note (Signed)
New.  Suspect this is multifactorial- increased acid production, high fiber diet, a lot of veggies.  Will start Omeprazole and encouraged pt to add Beano prior to eating.  If no improvement will refer to GI.  Pt expressed understanding and is in agreement w/ plan.

## 2019-12-14 NOTE — Patient Instructions (Addendum)
Schedule your complete physical in 6 months We'll notify you of your lab results and make any changes if needed START the Omeprazole daily for acid reflux ADD Beano prior to eating- particularly meals w/ a lot of veggies Continue to work on healthy diet and regular exercise We did your PSA in November so we're all caught up Call with any questions or concerns Have a great summer!!!

## 2019-12-14 NOTE — Assessment & Plan Note (Signed)
Last A1C 6.5.  Again stressed low carb diet and regular exercise.  Check labs and adjust tx plan prn.

## 2019-12-14 NOTE — Assessment & Plan Note (Signed)
Ongoing issue for pt.  Stressed need for healthy diet and regular exercise.  Will follow. 

## 2019-12-14 NOTE — Progress Notes (Addendum)
   Subjective:    Patient ID: [name] [name], male    DOB: [birth year], 69 y.o.   MRN: [id number]  HPI HTN- chronic problem, on Metoprolol 100mg  and Telmisartan 80mg  daily w/ good control.  No CP, SOB, HAs, visual changes, edema.  Hyperlipidemia- chronic problem, on Simvastatin 20mg  daily.  Denies abd pain, N/V.  Obesity- weight is relatively stable, BMI 30.37.  Will golf and do yard work but no regular exercise.  Gas/bloating- pt reports daily BM.  Says he will have loud bowel sounds when eating and often has increased bloating/distension.  'I love salads and pasta'.  Increased GERD.  Has used some OTC Tagamet.    Decreased hearing L ear- feels there is wax build up and would like this removed   Review of Systems For ROS see HPI   This visit occurred during the SARS-CoV-2 public health emergency.  Safety protocols were in place, including screening questions prior to the visit, additional usage of staff PPE, and extensive cleaning of exam room while observing appropriate contact time as indicated for disinfecting solutions.       Objective:   Physical Exam Vitals reviewed.  Constitutional:      General: He is not in acute distress.    Appearance: Normal appearance. He is well-developed.  HENT:     Head: Normocephalic and atraumatic.     Right Ear: There is no impacted cerumen.     Left Ear: Tympanic membrane normal. There is impacted cerumen (removed via curette and pt tolerated w/o difficulty).  Eyes:     Conjunctiva/sclera: Conjunctivae normal.     Pupils: Pupils are equal, round, and reactive to light.  Neck:     Thyroid: No thyromegaly.  Cardiovascular:     Rate and Rhythm: Normal rate and regular rhythm.     Heart sounds: Murmur (aortic click) present.  Pulmonary:     Effort: Pulmonary effort is normal. No respiratory distress.     Breath sounds: Normal breath sounds.  Abdominal:     General: Bowel sounds are normal. There is distension (mild).     Palpations:  Abdomen is soft.  Musculoskeletal:     Cervical back: Normal range of motion and neck supple.  Lymphadenopathy:     Cervical: No cervical adenopathy.  Skin:    General: Skin is warm and dry.  Neurological:     Mental Status: He is alert and oriented to person, place, and time.     Cranial Nerves: No cranial nerve deficit.  Psychiatric:        Behavior: Behavior normal.           Assessment & Plan:  Cerumen impaction- pt asked for wax removal today in office.  Wax successfully removed from L ear via curette and pt tolerated w/o difficulty.  Hearing improved and pt was pleased w/ results.

## 2019-12-14 NOTE — Assessment & Plan Note (Signed)
Chronic problem.  Tolerating statin w/o difficulty.  Check labs.  Adjust meds prn  

## 2019-12-14 NOTE — Assessment & Plan Note (Signed)
Deteriorated.  Pt has not been on any medication regularly and reports sxs have worsened.  This is likely contributing to the gas and bloating.  Start Omeprazole daily.  Will follow.

## 2019-12-15 ENCOUNTER — Other Ambulatory Visit: Payer: Self-pay | Admitting: *Deleted

## 2019-12-15 DIAGNOSIS — I712 Thoracic aortic aneurysm, without rupture, unspecified: Secondary | ICD-10-CM

## 2020-01-08 ENCOUNTER — Other Ambulatory Visit: Payer: Self-pay | Admitting: General Practice

## 2020-01-08 MED ORDER — SIMVASTATIN 20 MG PO TABS: ORAL_TABLET | ORAL | 1 refills | Status: DC

## 2020-01-18 ENCOUNTER — Ambulatory Visit
Admission: RE | Admit: 2020-01-18 | Discharge: 2020-01-18 | Disposition: A | Discharge status: Home / Self Care | Payer: Medicare Other | Source: Ambulatory Visit | Attending: Cardiothoracic Surgery | Admitting: Cardiothoracic Surgery

## 2020-01-18 ENCOUNTER — Other Ambulatory Visit: Payer: Self-pay

## 2020-01-18 DIAGNOSIS — I712 Thoracic aortic aneurysm, without rupture, unspecified: Secondary | ICD-10-CM

## 2020-01-18 MED ORDER — GADOBENATE DIMEGLUMINE 529 MG/ML IV SOLN
20.0000 mL | Freq: Once | INTRAVENOUS | Status: AC | PRN
  Administered 2020-01-18: 20 mL via INTRAVENOUS

## 2020-01-23 ENCOUNTER — Other Ambulatory Visit (HOSPITAL_COMMUNITY): Payer: Self-pay | Admitting: *Deleted

## 2020-01-23 MED ORDER — METOPROLOL SUCCINATE ER 100 MG PO TB24: ORAL_TABLET | ORAL | 0 refills | Status: DC

## 2020-02-01 ENCOUNTER — Encounter: Payer: Self-pay | Admitting: Cardiothoracic Surgery

## 2020-02-01 ENCOUNTER — Ambulatory Visit: Payer: Medicare Other | Admitting: Cardiothoracic Surgery

## 2020-02-01 ENCOUNTER — Other Ambulatory Visit: Payer: Self-pay

## 2020-02-01 VITALS — BP 121/80 | HR 115 | Temp 99.0°F | Resp 18 | Wt 239.2 lb

## 2020-02-01 DIAGNOSIS — I712 Thoracic aortic aneurysm, without rupture: Secondary | ICD-10-CM | POA: Diagnosis not present

## 2020-02-01 DIAGNOSIS — I7121 Aneurysm of the ascending aorta, without rupture: Secondary | ICD-10-CM

## 2020-02-01 NOTE — Progress Notes (Signed)
[name]       [place] [address]             [phone]                    [name] [name] Medical Record #[id number] Date of Birth: 1951/01/19  Referring: [name], MD Primary Care: [name], MD  Chief Complaint:    Chief Complaint  Patient presents with  . Thoracic Aortic Aneurysm    6 month f/u with MRA Chest 01/18/20    History of Present Illness:    [name] [name] 69 y.o. male followed in the  office after Harrington Challenger procedure done in [place]   for congenital aortic insufficiency  Case almost  27years ago.  Patient is asymptomatic without signs or symptoms of heart failure.  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  He has been noted to have dilatation of his aortic root at the sinuses of Valsalva following his Ross procedure. Reports from echocardiogram in 2002 measured aortic root at 5.1  cm,The patient's mid ascending aorta which is partially native aorta and partial prosthetic graft is not significantly dilated.  Review of cardiac MRIs back to 2015 showed little change in the homograft root.   Patient has elevated left hemidiaphragm noted back on previous scans.   pmh:  1. Aortic insufficiency: s/p Ross procedure in 1994 in [place]. Aortic insufficiency: Echo (4/11) with hypokinetic basal septum (likely post-surgical), EF 50%, mild LVH, s/p Ross procedure with native pulmonic valve in aortic position with mild aortic insufficiency and bioprosthetic pulmonic valve with no pulmonic insufficiency, mild MR, aortic upper normal in size. Echo (4/13) with EF 55%, mild LVH, mild AI, mild MR, mild RV dilation, bioprosthetic pulmonic valve with peak pressure 25 mmHg. Echo (3/14): Mild LV dilation, mild LVH, EF 60%, pulmonary valve in aortic position (s/p Ross) with mild AI and mildly dilated ascending aorta  to 4.3 cm, mildly dilated RV with normal systolic function, bioprosthetic PVR with mean gradient 23 mmHg. Echo (3/15) with EF 55-60%, mild LVH, mild AI with no AS, bioprosthetic pulmonic valve with peak gradient 31 mmHg, ascending aorta 4.4 cm. Cardiac MRI/MRA chest (5/15) with mild regurgitation of autograft aortic valve and mild regurgitation of homograft pulmonic valve, dilation of aortic root at sinuses of valsalva (4.8 cm), EF 6%, no LGE, mild RV dilation with normal RV systolic function. Echo (5/16) with EF 55-60%, mild autograft AI, mild MR, mildly decreased RV systolic function, biatrial enlargement, RVSP 35 mmHg, trivially increased gradient across bioprosthetic pulmonic valve (no progression).  2. Post-operative atrial fibrillation after heart surgery.  3. Normal left heart cath prior to 1994 heart surgery.  4. Left brachial plexopathy with left shoulder muscle atrophy. Uncertain etiology.  5. BPH 6. Hyperlipidemia.  7. Cervical spinal stenosis with radiculopathy.  8. 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  9. ETT-Sestamibi (3/14): No ischemia or infarction.  10. Carotid dopplers (6/14): mild disease only.      Current Activity/ Functional Status:  Patient is independent with mobility/ambulation, transfers, ADL's, IADL's.   Zubrod Score: At the time of surgery this patient's most appropriate activity status/level should be described as: [x]     0  Normal activity, no symptoms []     1    Restricted in physical strenuous activity but ambulatory, able to do out light work []     2    Ambulatory and capable of self care, unable to do work activities, up and about               >50 % of waking hours                              []     3    Only limited self care, in bed greater than 50% of waking hours []     4    Completely disabled, no self care, confined to bed or chair []     5    Moribund   Past  Medical History:  Diagnosis Date  . Acute gastritis   . Aortic insufficiency    s/p aortic valve replacement in 1994  . Early satiety   . Esophageal stricture   . GERD (gastroesophageal reflux disease)   . Headache syndrome 10/18/2018  . Hiatal hernia   . Hyperlipidemia   . Neuropathy    left brachial plexus  . Pain, dental     Past Surgical History:  Procedure Laterality Date  . AORTIC VALVE REPLACEMENT  1994  . GANGLION CYST EXCISION  JAN 2002  . HIATAL HERNIA REPAIR  DEC 2002  . TONSILLECTOMY    . VASECTOMY  DEC 2002    Family History  Problem Relation Age of Onset  . Lung cancer Father        hx of smoking   . Hypertension Other   . Prostate cancer Other   . Leukemia Brother        Passed at age of 58  . Colon cancer Neg Hx   . Pancreatic cancer Neg Hx   . Rectal cancer Neg Hx   . Stomach cancer Neg Hx     Social History   Socioeconomic History  . Marital status: Married    Spouse name: Not on file  . Number of children: 3  . Years of education: Not on file  . Highest education level: Not on file  Occupational History  . Not on file  Social Needs  . Financial resource strain: Not on file  . Food insecurity:    Worry: Not on file    Inability: Not on file  . Transportation needs:    Medical: Not on file    Non-medical: Not on file  Tobacco Use  . Smoking status: Never Smoker  . Smokeless tobacco: Never Used    Social History   Tobacco Use  Smoking Status Never Smoker  Smokeless Tobacco Never Used    Social History   Substance and Sexual Activity  Alcohol Use No     Allergies  Allergen Reactions  . Penicillins Hives and Swelling  . Quinidine Other (See Comments)    Increase heart beat per patient  . Quinolones     Current Outpatient Medications  Medication Sig Dispense Refill  . aspirin 81 MG chewable tablet Chew 81 mg by mouth daily.      . cetirizine (ZYRTEC) 10 MG tablet TAKE 1 TABLET(10 MG) BY MOUTH DAILY 90 tablet 3  .  finasteride (PROSCAR) 5 MG tablet Take 1 tablet (5 mg total) by mouth daily. 90 tablet 1  . fluticasone (FLONASE) 50 MCG/ACT nasal spray SHAKE LIQUID AND USE 2 SPRAYS IN  EACH NOSTRIL DAILY 16 g 6  . metoprolol succinate (TOPROL-XL) 100 MG 24 hr tablet TAKE 1/2 TABLET BY MOUTH EVERY MORNING AND TAKE 1 TABLET EVERY EVENING 135 tablet 0  . omeprazole (PRILOSEC) 40 MG capsule Take 1 capsule (40 mg total) by mouth daily. 30 capsule 3  . simvastatin (ZOCOR) 20 MG tablet TAKE 1 TABLET(20 MG) BY MOUTH DAILY 90 tablet 1  . telmisartan (MICARDIS) 80 MG tablet Take 1 tablet (80 mg total) by mouth daily. 90 tablet 1   No current facility-administered medications for this visit.      Review of Systems:  Review of Systems  Constitutional: Negative.   HENT: Negative.   Eyes: Negative.   Respiratory: Negative.   Cardiovascular: Negative for chest pain, palpitations, orthopnea, claudication, leg swelling and PND.  Gastrointestinal: Negative.   Genitourinary: Negative.   Musculoskeletal: Negative.   Skin: Negative.   Neurological: Negative.   Psychiatric/Behavioral: Negative.     Immunizations: Flu up to date [ y ]; Pneumococcal up to date [ y ]; Covid vaccination x2   Physical Exam: BP 121/80 (BP Location: Right Arm, Patient Position: Sitting, Cuff Size: Normal)   Pulse (!) 115   Temp 99 F (37.2 C)   Resp 18   Wt 239 lb 3.2 oz (108.5 kg)   SpO2 95% Comment: RA  BMI 29.90 kg/m   PHYSICAL EXAMINATION: General appearance: alert, cooperative and no distress Head: Normocephalic, without obvious abnormality, atraumatic Neck: no adenopathy, no carotid bruit, no JVD, supple, symmetrical, trachea midline and thyroid not enlarged, symmetric, no tenderness/mass/nodules Resp: clear to auscultation bilaterally Cardio: regular rate and rhythm, S1, S2 normal, no murmur, click, rub or gallop GI: soft, non-tender; bowel sounds normal; no masses,  no organomegaly Extremities: extremities normal,  atraumatic, no cyanosis or edema and Homans sign is negative, no sign of DVT Neurologic: Grossly normal   Diagnostic Studies & Laboratory data:     Recent Radiology Findings:   MR Angiogram Chest W Wo Contrast  Result Date: 01/18/2020 CLINICAL DATA:  Follow-up thoracic aortic aneurysm. History of Ross procedure (pulmonary valve autograft). EXAM: MRA CHEST WITH OR WITHOUT CONTRAST TECHNIQUE: Angiographic images of the chest were obtained using MRA technique with intravenous contrast. CONTRAST:  13mL MULTIHANCE GADOBENATE DIMEGLUMINE 529 MG/ML IV SOLN COMPARISON:  01/26/2019; 10/01/2017; 09/04/2016; 08/19/2015; 02/12/2015 FINDINGS: Vascular Findings: Stable sequela of previous median sternotomy and aortic valve replacement with postoperative change of the aortic root and proximal ascending thoracic aorta. There is grossly unchanged aneurysmal dilatation of the aortic root and mild ectasia of the native ascending thoracic aorta with measurements as follows. The remainder of the thoracic aorta is of normal caliber. No definite evidence of thoracic aortic dissection or periaortic stranding on this nongated examination. Conventional configuration of the aortic arch. The branch vessels of the aortic arch appear patent throughout their imaged courses. Borderline cardiomegaly.  No pericardial effusion. Sequela of previous pulmonary valve replacement. Although this examination was not tailored for the evaluation the pulmonary arteries, there are no discrete filling defects within the central pulmonary arterial tree to suggest central pulmonary embolism. Normal caliber of the main pulmonary artery. ------------------------------------------------------------- Thoracic aortic measurements: Aortic root: 54 mm as measured in greatest oblique short axis sagittal diameter (image 35, series 11), similar to the 02/2015 examination. Sinotubular junction 43 mm as measured in greatest oblique short axis sagittal dimension  (image 35, series 11), similar to the 02/2015 examination Proximal ascending aorta 36 mm as measured in greatest oblique short axis sagittal dimension (  sagittal image 47, series 11), unchanged compared to the 02/2015 examination. Aortic arch aorta 33 mm as measured in greatest oblique short axis sagittal dimension. Proximal descending thoracic aorta 32 mm as measured in greatest oblique short axis axial dimension at the level of the main pulmonary artery. Distal descending thoracic aorta 31 mm as measured in greatest oblique short axis axial dimension at the level of the diaphragmatic hiatus. Review of the MIP images confirms the above findings. ------------------------------------------------------------- Non-Vascular Findings: Mediastinum/Lymph Nodes: No bulky mediastinal, hilar axillary lymphadenopathy. Lungs/Pleura: No discrete focal airspace opacities. No pleural effusion. Upper abdomen: Limited evaluation of the upper abdomen is unremarkable. Musculoskeletal: No definite acute or aggressive osseous abnormalities. IMPRESSION: 1. Stable sequela of aortic valve replacement (Ross procedure) with uncomplicated fusiform dilatation of the aortic root measuring approximately 54 mm in diameter, grossly unchanged compared to the 02/2015 examination. 2. Stable mild fusiform ectasia of the native ascending thoracic aorta measuring approximately 36 mm in diameter, also unchanged compared to the 2016 examination. Electronically Signed   By: Sandi Mariscal M.D.   On: 01/18/2020 13:42   I have independently reviewed the above radiology studies  and reviewed the findings with the patient.      Mr Angiogram Chest W Wo Contrast  Result Date: 09/09/2016 CLINICAL DATA:  Status post Harrington Challenger procedure with follow-up of dilated aortic root. EXAM: MRA CHEST WITH OR WITHOUT CONTRAST TECHNIQUE: Preliminary sequences were obtained prior to administration of contrast. Angiographic images of the chest were obtained using MRA technique with  intravenous contrast. CONTRAST:  24mL MULTIHANCE GADOBENATE DIMEGLUMINE 529 MG/ML IV SOLN COMPARISON:  08/19/2015 FINDINGS: VASCULAR Aorta: Dilatation of the pulmonary autograft at the level of the sinuses of Valsalva appears grossly stable. Maximal diameter measuring into the left cusp is approximately 5.4 cm. Diameter measuring into the right cusp is approximately 5.1 cm. Diameter measuring into the non coronary cusp is approximately 5.0 cm. Anastomosis of autograft and native aorta appears stable and measures 2.4 cm. Ascending aorta measures 3.3 cm. Aortic arch measures 3 cm. Descending thoracic aorta measures 2.9 cm. No evidence of aortic dissection. Proximal great vessels show stable and normal patency. Heart: Heart size appears stable and normal. No pericardial fluid identified. Pulmonary Vasculature: No dilatation of pulmonary arteries. Stable appearance of central pulmonary veins. NON-VASCULAR Stable elevation of left hemidiaphragm. No lymphadenopathy or masses identified. No pleural fluid or pulmonary abnormalities identified. IMPRESSION: Stable dilatation of pulmonary autograft at the level of the sinuses of Valsalva. Stable relative narrowing of autograft anastomosis with the native aorta. No evidence of aortic dissection. Electronically Signed   By: Aletta Edouard M.D.   On: 09/09/2016 13:43    Mr Angiogram Chest W Wo Contrast  08/19/2015  CLINICAL DATA:  S/p Ross procedure with dilated aortic root. EXAM: CARDIAC MRI TECHNIQUE: The patient was scanned on a 1.5 Tesla GE magnet. A dedicated cardiac coil was used. FIESTA sequences were done for functional assessment. The patient received 25 cc of Multihance contrast for MR angiography. CONTRAST:  25 cc Multihance FINDINGS: The patient was s/p sternotomy. Limited images of lung fields showed no gross abnormalities. There was a pulmonary valve and artery autograft in the aortic position. The autograft valve was trileaflet with mild autograft valve  insufficiency. There did not appear to be autograft valve stenosis. There was a homograft in the pulmonary position. The homograft valve was thickened with probably mild regurgitation. There was turbulent flow across the homograft valve suggestive of some degree of stenosis (this is better assessed  by echo). The autograft in the aortic position had dilated sinuses of valsalva, especially the left coronary sinus (SOV measured 5.2 cm when measured into the left coronary sinus). The coronary arteries were visualized and appeared patent proximally (coronary buttons re-attached to pulmonary autograft). There was narrowing at the suture line where the autograft was sewed into the native ascending aorta. At the narrowing, the ascending aorta was 2.4 cm in diameter. There did appear to be some turbulent flow at this narrowing. The aortic arch was not dilated. The great vessels had normal origins. The pulmonary veins appeared to drain normally to the left atrium (though the right-sided veins were not well-visualized). MEASUREMENTS: MEASUREMENTS Sinus of valsalva measurement into left cusp 5.2 cm, into right cusp 4.7 cm, into noncoronary cusp 4.2 cm Sinotubular junction 4.5 cm Ascending aorta at narrowing where autograft joined to native aorta 2.4 cm Aortic arch 3.2 cm Descending thoracic aorta 2.6 cm IMPRESSION: 1. Status post Ross procedure with pulmonary valve and artery autograft in aortic position with re-implanted coronary buttons and homograft in the pulmonary position. There was mild autograft valve regurgitation and no significant stenosis. The homograft pulmonary valve was thickened with at least mild regurgitation and suspect a degree of stenosis. 2. There was dilation of the pulmonary autograft in the aortic position at the sinuses of valsalva, reaching 5.2 cm as measured into the left coronary cusp. There was also narrowing with some turbulent flow at the suture line where autograft was sewed into the native  ascending aorta. Sinus of valsalva dilation does not appear to have significant progressed compared to prior MRA. Dalton Mclean Electronically Signed   By: Loralie Champagne M.D.   On: 08/19/2015 23:08     ECHO  10/11/2017: Echocardiography  Patient:    Richards, Pherigo MR #:       [id number] Study Date: 10/11/2017 Gender:     M Age:        64 Height:     190.5 cm Weight:     110.6 kg BSA:        2.44 m^2 Pt. Status: Room:   Elenore Paddy, M.D.  REFERRING    Loralie Champagne, M.D.  PERFORMING   [facility], Outpatient  SONOGRAPHER  [facility], [facility]  ATTENDING    Skeet Latch, MD  cc:  ------------------------------------------------------------------- LV EF: 55% -   60%  ------------------------------------------------------------------- Indications:      Valve Replacement (Z95.2).  ------------------------------------------------------------------- History:   PMH:  History of Ross Procedure (1994) now with Aortic Root Aneurysm.  Risk factors:  Hypertension. Dyslipidemia.  ------------------------------------------------------------------- Study Conclusions  - Left ventricle: The cavity size was normal. Systolic function was   normal. The estimated ejection fraction was in the range of 55%   to 60%. Wall motion was normal; there were no regional wall   motion abnormalities. Left ventricular diastolic function   parameters were normal. - Aortic valve: A bioprosthesis was present. Transvalvular velocity   was within the normal range. There was no stenosis. There was   mild regurgitation. Valve area (VTI): 5.15 cm^2. Valve area   (Vmax): 5.22 cm^2. Valve area (Vmean): 4.79 cm^2. - Aorta: Aortic root dimension: 47 mm (ED). Ascending aortic   diameter: 50 mm (S). - Aortic root: The aortic root was mildly dilated. - Ascending aorta: The ascending aorta was moderately dilated. - Mitral valve: Transvalvular velocity was within the normal range.   There was  no evidence for stenosis. There was mild regurgitation. -  Right ventricle: The cavity size was mildly dilated. Wall   thickness was normal. Systolic function was normal. - Right atrium: The atrium was severely dilated. - Tricuspid valve: There was mild regurgitation. - Pulmonic valve: A bioprosthesis was present. Transvalvular   velocity was within the normal range. There was no evidence for   stenosis. There was mild regurgitation. Peak gradient (S): 20 mm   Hg.  Impressions:  - Compared with the echo 03/2017, the ascending aorta has increased   from 4.5 cm to 5.0 cm.  ------------------------------------------------------------------- Study data:  Comparison was made to the study of 03/17/2017.  Study status:  Routine.  Procedure:  Transthoracic echocardiography. Image quality was adequate.          Echocardiography.  M-mode, complete 2D, 3D, spectral Doppler, and color Doppler.  Birthdate: Patient birthdate: Sep 07, 1950.  Age:  Patient is 69 yr old.  Sex: Gender: male.    BMI: 30.5 kg/m^2.  Blood pressure:     140/88 Patient status:  Outpatient.  Study date:  Study date: 10/11/2017. Study time: 08:40 AM.  Location:  [facility]  -------------------------------------------------------------------  ------------------------------------------------------------------- Left ventricle:  The cavity size was normal. Systolic function was normal. The estimated ejection fraction was in the range of 55% to 60%. Wall motion was normal; there were no regional wall motion abnormalities. The transmitral flow pattern was normal. The deceleration time of the early transmitral flow velocity was normal. The pulmonary vein flow pattern was normal. The tissue Doppler parameters were normal. Left ventricular diastolic function parameters were normal.  ------------------------------------------------------------------- Aortic valve:   Normal thickness leaflets. A bioprosthesis  was present. Mobility was not restricted.  Doppler:  Transvalvular velocity was within the normal range. There was no stenosis. There was mild regurgitation.    VTI ratio of LVOT to aortic valve: 0.78. Valve area (VTI): 5.15 cm^2. Indexed valve area (VTI): 2.11 cm^2/m^2. Peak velocity ratio of LVOT to aortic valve: 0.79. Valve area (Vmax): 5.22 cm^2. Indexed valve area (Vmax): 2.14 cm^2/m^2. Mean velocity ratio of LVOT to aortic valve: 0.73. Valve area (Vmean): 4.79 cm^2. Indexed valve area (Vmean): 1.96 cm^2/m^2. Mean gradient (S): 1 mm Hg. Peak gradient (S): 2 mm Hg.  ------------------------------------------------------------------- Aorta:  Aortic root: The aortic root was mildly dilated. Ascending aorta: The ascending aorta was moderately dilated.  ------------------------------------------------------------------- Mitral valve:   Structurally normal valve.   Mobility was not restricted.  Doppler:  Transvalvular velocity was within the normal range. There was no evidence for stenosis. There was mild regurgitation.    Peak gradient (D): 2 mm Hg.  ------------------------------------------------------------------- Left atrium:  The atrium was normal in size.  ------------------------------------------------------------------- Right ventricle:  The cavity size was mildly dilated. Wall thickness was normal. Systolic function was normal.  ------------------------------------------------------------------- Pulmonic valve:   A bioprosthesis was present.  Doppler: Transvalvular velocity was within the normal range. There was no evidence for stenosis. There was mild regurgitation. Peak gradient (S): 20 mm Hg.  ------------------------------------------------------------------- Tricuspid valve:   Structurally normal valve.    Doppler: Transvalvular velocity was within the normal range. There was  mild regurgitation.  ------------------------------------------------------------------- Pulmonary artery:   The main pulmonary artery was normal-sized. Systolic pressure was within the normal range.  ------------------------------------------------------------------- Right atrium:  The atrium was severely dilated.  ------------------------------------------------------------------- Pericardium:  There was no pericardial effusion.  ------------------------------------------------------------------- Systemic veins: Inferior vena cava: The vessel was normal in size.  ------------------------------------------------------------------- Measurements   Left ventricle  Value          Reference  LV ID, ED, PLAX chordal                  47.1  mm       43 - 52  LV ID, ES, PLAX chordal                  31.8  mm       23 - 38  LV fx shortening, PLAX chordal           32    %        >=29  LV PW thickness, ED                      10.1  mm       ----------  IVS/LV PW ratio, ED                      1.06           <=1.3  Stroke volume, 2D                        84    ml       ----------  Stroke volume/bsa, 2D                    34    ml/m^2   ----------  LV e&', lateral                           12.1  cm/s     ----------  LV E/e&', lateral                         6.3            ----------  LV e&', medial                            8.05  cm/s     ----------  LV E/e&', medial                          9.47           ----------  LV e&', average                           10.08 cm/s     ----------  LV E/e&', average                         7.56           ----------    Ventricular septum                       Value          Reference  IVS thickness, ED                        10.7  mm       ----------    LVOT  Value          Reference  LVOT ID, S                               29    mm       ----------  LVOT area                                 6.61  cm^2     ----------  LVOT peak velocity, S                    58.4  cm/s     ----------  LVOT mean velocity, S                    35.9  cm/s     ----------  LVOT VTI, S                              12.7  cm       ----------    Aortic valve                             Value          Reference  Aortic valve peak velocity, S            73.9  cm/s     ----------  Aortic valve mean velocity, S            49.5  cm/s     ----------  Aortic valve VTI, S                      16.3  cm       ----------  Aortic mean gradient, S                  1     mm Hg    ----------  Aortic peak gradient, S                  2     mm Hg    ----------  VTI ratio, LVOT/AV                       0.78           ----------  Aortic valve area, VTI                   5.15  cm^2     ----------  Aortic valve area/bsa, VTI               2.11  cm^2/m^2 ----------  Velocity ratio, peak, LVOT/AV            0.79           ----------  Aortic valve area, peak velocity         5.22  cm^2     ----------  Aortic valve area/bsa, peak              2.14  cm^2/m^2 ----------  velocity  Velocity ratio, mean, LVOT/AV            0.73           ----------  Aortic valve  area, mean velocity         4.79  cm^2     ----------  Aortic valve area/bsa, mean              1.96  cm^2/m^2 ----------  velocity  Aortic regurg pressure half-time         719   ms       ----------    Aorta                                    Value          Reference  Aortic root ID, ED                       47    mm       ----------  Ascending aorta ID, A-P, S               50    mm       ----------    RVOT                                     Value          Reference  RVOT area                                5.73  cm^2     ----------    Left atrium                              Value          Reference  LA ID, A-P, ES                           50    mm       ----------  LA ID/bsa, A-P                           2.05  cm/m^2   <=2.2  LA volume, S                              68.9  ml       ----------  LA volume/bsa, S                         28.2  ml/m^2   ----------  LA volume, ES, 1-p A4C                   69.4  ml       ----------  LA volume/bsa, ES, 1-p A4C               28.4  ml/m^2   ----------  LA volume, ES, 1-p A2C                   67.3  ml       ----------  LA volume/bsa, ES, 1-p A2C               27.6  ml/m^2   ----------  Mitral valve                             Value          Reference  Mitral E-wave peak velocity              76.2  cm/s     ----------  Mitral A-wave peak velocity              57.7  cm/s     ----------  Mitral deceleration time                 225   ms       150 - 230  Mitral peak gradient, D                  2     mm Hg    ----------  Mitral E/A ratio, peak                   1.3            ----------    Tricuspid valve                          Value          Reference  Tricuspid regurg peak velocity           253   cm/s     ----------  Tricuspid peak RV-RA gradient            26    mm Hg    ----------    Right atrium                             Value          Reference  RA ID, S-I, ES, A4C              (H)     68    mm       34 - 49  RA area, ES, A4C                 (H)     32.2  cm^2     8.3 - 19.5  RA volume, ES, A/L                       122   ml       ----------  RA volume/bsa, ES, A/L                   49.9  ml/m^2   ----------    Systemic veins                           Value          Reference  Estimated CVP                            3     mm Hg    ----------    Right ventricle                          Value          Reference  TAPSE  21.8  mm       ----------  RV pressure, S, DP                       29    mm Hg    <=30  RV s&', lateral, S                        10.4  cm/s     ----------    Pulmonic valve                           Value          Reference  Pulmonic valve peak velocity, S          222   cm/s     ----------  Pulmonic peak gradient, S                 20    mm Hg    ----------  Legend: (L)  and  (H)  mark values outside specified reference range.  ------------------------------------------------------------------- Prepared and Electronically Authenticated by  Skeet Latch, MD 2019-03-11T13:35:05    Recent Lab Findings: Lab Results  Component Value Date   WBC 6.9 12/14/2019   HGB 14.6 12/14/2019   HCT 43.1 12/14/2019   PLT 236.0 12/14/2019   GLUCOSE 133 (H) 12/14/2019   CHOL 131 12/14/2019   TRIG 104.0 12/14/2019   HDL 41.10 12/14/2019   LDLDIRECT 69.8 05/31/2014   LDLCALC 70 12/14/2019   ALT 27 12/14/2019   AST 22 12/14/2019   NA 136 12/14/2019   K 4.7 12/14/2019   CL 103 12/14/2019   CREATININE 0.78 12/14/2019   BUN 15 12/14/2019   CO2 28 12/14/2019   TSH 1.61 12/14/2019   HGBA1C 6.6 (H) 12/14/2019      Assessment / Plan:   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  The homograft root appears to be unchanged in size on all the scans that are available, and does not appear to be progressing the enlarging.   Patient also has incidental note of an elevated left diaphragm, this is not commented on an old chest x-ray reports, but as noted on previous MRI scans. 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  Plan to see back in 12  months with a MRA of the chest to evaluate size of his aorta.  Patient has follow-up visit with cardiology and possible echocardiogram per their discretion.  Grace Isaac MD      [address] [place] [address] Office [phone]   Beeper [phone]  02/01/2020 2:03 PM

## 2020-02-01 NOTE — Progress Notes (Signed)
Needs echo scheduled to follow Ross procedure.

## 2020-02-27 ENCOUNTER — Encounter: Payer: Self-pay | Admitting: Family Medicine

## 2020-03-01 ENCOUNTER — Ambulatory Visit: Payer: Medicare Other | Admitting: Family Medicine

## 2020-03-01 ENCOUNTER — Other Ambulatory Visit: Payer: Self-pay

## 2020-03-01 ENCOUNTER — Encounter: Payer: Self-pay | Admitting: Family Medicine

## 2020-03-01 VITALS — BP 125/89 | HR 111 | Temp 97.9°F | Ht 75.0 in | Wt 240.0 lb

## 2020-03-01 DIAGNOSIS — K644 Residual hemorrhoidal skin tags: Secondary | ICD-10-CM

## 2020-03-01 MED ORDER — HYDROCORTISONE ACETATE 25 MG RE SUPP: 25.0000 mg | Freq: Two times a day (BID) | RECTAL | 0 refills | Status: DC

## 2020-03-01 NOTE — Patient Instructions (Signed)
It was very nice to see you today!  You have a hemorrhoid.  Please start the hydrocortisone suppositories.  Please start a fiber supplement such as Metamucil or Benefiber and make sure that you are getting plenty of fluids.  Please use a sitz bath twice daily for the next week or 2.  Let us know if not improving over the next week or 2.  Take care, Dr Jerline Pain   Hemorrhoids Hemorrhoids are swollen veins that may develop:  In the butt (rectum). These are called internal hemorrhoids.  Around the opening of the butt (anus). These are called external hemorrhoids. Hemorrhoids can cause pain, itching, or bleeding. Most of the time, they do not cause serious problems. They usually get better with diet changes, lifestyle changes, and other home treatments. What are the causes? This condition may be caused by:  Having trouble pooping (constipation).  Pushing hard (straining) to poop.  Watery poop (diarrhea).  Pregnancy.  Being very overweight (obese).  Sitting for long periods of time.  Heavy lifting or other activity that causes you to strain.  Anal sex.  Riding a bike for a long period of time. What are the signs or symptoms? Symptoms of this condition include:  Pain.  Itching or soreness in the butt.  Bleeding from the butt.  Leaking poop.  Swelling in the area.  One or more lumps around the opening of your butt. How is this diagnosed? A doctor can often diagnose this condition by looking at the affected area. The doctor may also:  Do an exam that involves feeling the area with a gloved hand (digital rectal exam).  Examine the area inside your butt using a small tube (anoscope).  Order blood tests. This may be done if you have lost a lot of blood.  Have you get a test that involves looking inside the colon using a flexible tube with a camera on the end (sigmoidoscopy or colonoscopy). How is this treated? This condition can usually be treated at home. Your  doctor may tell you to change what you eat, make lifestyle changes, or try home treatments. If these do not help, procedures can be done to remove the hemorrhoids or make them smaller. These may involve:  Placing rubber bands at the base of the hemorrhoids to cut off their blood supply.  Injecting medicine into the hemorrhoids to shrink them.  Shining a type of light energy onto the hemorrhoids to cause them to fall off.  Doing surgery to remove the hemorrhoids or cut off their blood supply. Follow these instructions at home: Eating and drinking   Eat foods that have a lot of fiber in them. These include whole grains, beans, nuts, fruits, and vegetables.  Ask your doctor about taking products that have added fiber (fibersupplements).  Reduce the amount of fat in your diet. You can do this by: ? Eating low-fat dairy products. ? Eating less red meat. ? Avoiding processed foods.  Drink enough fluid to keep your pee (urine) pale yellow. Managing pain and swelling   Take a warm-water bath (sitz bath) for 20 minutes to ease pain. Do this 3-4 times a day. You may do this in a bathtub or using a portable sitz bath that fits over the toilet.  If told, put ice on the painful area. It may be helpful to use ice between your warm baths. ? Put ice in a plastic bag. ? Place a towel between your skin and the bag. ? Leave the ice on  for 20 minutes, 2-3 times a day. General instructions  Take over-the-counter and prescription medicines only as told by your doctor. ? Medicated creams and medicines may be used as told.  Exercise often. Ask your doctor how much and what kind of exercise is best for you.  Go to the bathroom when you have the urge to poop. Do not wait.  Avoid pushing too hard when you poop.  Keep your butt dry and clean. Use wet toilet paper or moist towelettes after pooping.  Do not sit on the toilet for a long time.  Keep all follow-up visits as told by your doctor. This is  important. Contact a doctor if you:  Have pain and swelling that do not get better with treatment or medicine.  Have trouble pooping.  Cannot poop.  Have pain or swelling outside the area of the hemorrhoids. Get help right away if you have:  Bleeding that will not stop. Summary  Hemorrhoids are swollen veins in the butt or around the opening of the butt.  They can cause pain, itching, or bleeding.  Eat foods that have a lot of fiber in them. These include whole grains, beans, nuts, fruits, and vegetables.  Take a warm-water bath (sitz bath) for 20 minutes to ease pain. Do this 3-4 times a day. This information is not intended to replace advice given to you by your health care provider. Make sure you discuss any questions you have with your health care provider. Document Revised: 07/28/2018 Document Reviewed: 12/09/2017 Elsevier Patient Education  [place].

## 2020-03-01 NOTE — Progress Notes (Signed)
   [name] [name] is a 69 y.o. male who presents today for an office visit.  Assessment/Plan:  New/Acute Problems: External hemorrhoid No red flags.  Discussed conservative management including sitz baths and increasing his fiber intake.  We will also send in hydrocortisone suppositories.  Encourage good oral hydration.  Symptoms are already slightly improving.  We will try the above for another 1 to 2 weeks and if still no improvement will likely need referral to see surgery.     Subjective:  HPI:  Patient here with concern for hemorrhoid.  Started 2 weeks ago.  Has tried over-the-counter measures including Preparation H, witch hazel, and lidocaine.  Thinks lidocaine has helped some.  Symptoms have slowly improved.  Has never had issues with hemorrhoids in the past.  He had some constipation several weeks ago but has not had any issues with this recently.  No rectal bleeding.       Objective:  Physical Exam: BP (!) 125/89   Pulse (!) 111   Temp 97.9 F (36.6 C)   Ht 6\' 3"  (1.905 m)   Wt (!) 240 lb (108.9 kg)   SpO2 97%   BMI 30.00 kg/m   Gen: No acute distress, resting comfortably CV: Regular rate and rhythm with no murmurs appreciated Pulm: Normal work of breathing, clear to auscultation bilaterally with no crackles, wheezes, or rhonchi GI: Approximately 2 cm external hemorrhoid noted. Neuro: Grossly normal, moves all extremities Psych: Normal affect and thought content      Tameshia Bonneville M. Jerline Pain, MD 03/01/2020 11:51 AM

## 2020-03-05 ENCOUNTER — Ambulatory Visit (HOSPITAL_BASED_OUTPATIENT_CLINIC_OR_DEPARTMENT_OTHER)
Admission: RE | Admit: 2020-03-05 | Discharge: 2020-03-05 | Disposition: A | Discharge status: Home / Self Care | Payer: Medicare Other | Source: Ambulatory Visit | Attending: Cardiology | Admitting: Cardiology

## 2020-03-05 ENCOUNTER — Encounter (HOSPITAL_COMMUNITY): Payer: Self-pay | Admitting: Cardiology

## 2020-03-05 ENCOUNTER — Encounter (HOSPITAL_COMMUNITY): Payer: Self-pay

## 2020-03-05 ENCOUNTER — Ambulatory Visit (HOSPITAL_COMMUNITY)
Admission: RE | Admit: 2020-03-05 | Discharge: 2020-03-05 | Disposition: A | Discharge status: Home / Self Care | Payer: Medicare Other | Source: Ambulatory Visit | Attending: Cardiology | Admitting: Cardiology

## 2020-03-05 ENCOUNTER — Other Ambulatory Visit: Payer: Self-pay

## 2020-03-05 VITALS — BP 126/76 | HR 110 | Ht 75.0 in | Wt 243.4 lb

## 2020-03-05 DIAGNOSIS — I110 Hypertensive heart disease with heart failure: Secondary | ICD-10-CM | POA: Diagnosis not present

## 2020-03-05 DIAGNOSIS — Z8679 Personal history of other diseases of the circulatory system: Secondary | ICD-10-CM

## 2020-03-05 DIAGNOSIS — I484 Atypical atrial flutter: Secondary | ICD-10-CM

## 2020-03-05 DIAGNOSIS — Z9889 Other specified postprocedural states: Secondary | ICD-10-CM | POA: Diagnosis not present

## 2020-03-05 DIAGNOSIS — E785 Hyperlipidemia, unspecified: Secondary | ICD-10-CM | POA: Diagnosis not present

## 2020-03-05 DIAGNOSIS — I5032 Chronic diastolic (congestive) heart failure: Secondary | ICD-10-CM | POA: Diagnosis not present

## 2020-03-05 DIAGNOSIS — I359 Nonrheumatic aortic valve disorder, unspecified: Secondary | ICD-10-CM | POA: Diagnosis not present

## 2020-03-05 DIAGNOSIS — Z954 Presence of other heart-valve replacement: Secondary | ICD-10-CM | POA: Diagnosis not present

## 2020-03-05 DIAGNOSIS — I351 Nonrheumatic aortic (valve) insufficiency: Secondary | ICD-10-CM | POA: Diagnosis not present

## 2020-03-05 LAB — ECHOCARDIOGRAM COMPLETE
AR max vel: 6.21 cm2
AV Area VTI: 6.18 cm2
AV Area mean vel: 5.91 cm2
AV Mean grad: 1 mmHg
AV Peak grad: 1.7 mmHg
Ao pk vel: 0.66 m/s
Area-P 1/2: 7.59 cm2
Calc EF: 28.6 %
MV M vel: 4.38 m/s
MV Peak grad: 76.6 mmHg
S' Lateral: 5.4 cm
Single Plane A2C EF: 22.2 %
Single Plane A4C EF: 33 %

## 2020-03-05 LAB — CBC
HCT: 45.2 % (ref 39.0–52.0)
Hemoglobin: 14.8 g/dL (ref 13.0–17.0)
MCH: 31 pg (ref 26.0–34.0)
MCHC: 32.7 g/dL (ref 30.0–36.0)
MCV: 94.6 fL (ref 80.0–100.0)
Platelets: 233 10*3/uL (ref 150–400)
RBC: 4.78 MIL/uL (ref 4.22–5.81)
RDW: 12.9 % (ref 11.5–15.5)
WBC: 7.7 10*3/uL (ref 4.0–10.5)
nRBC: 0 % (ref 0.0–0.2)

## 2020-03-05 LAB — BASIC METABOLIC PANEL
Anion gap: 11 (ref 5–15)
BUN: 17 mg/dL (ref 8–23)
CO2: 20 mmol/L — ABNORMAL LOW (ref 22–32)
Calcium: 9 mg/dL (ref 8.9–10.3)
Chloride: 104 mmol/L (ref 98–111)
Creatinine, Ser: 0.75 mg/dL (ref 0.61–1.24)
GFR calc Af Amer: >60 mL/min (ref >60)
GFR calc non Af Amer: >60 mL/min (ref >60)
Glucose, Bld: 130 mg/dL — ABNORMAL HIGH (ref 70–99)
Potassium: 4.3 mmol/L (ref 3.5–5.1)
Sodium: 135 mmol/L (ref 135–145)

## 2020-03-05 LAB — BRAIN NATRIURETIC PEPTIDE: B Natriuretic Peptide: 230.7 pg/mL — ABNORMAL HIGH (ref 0.0–100.0)

## 2020-03-05 MED ORDER — POTASSIUM CHLORIDE CRYS ER 10 MEQ PO TBCR: 10.0000 meq | EXTENDED_RELEASE_TABLET | Freq: Every day | ORAL | 3 refills | Status: DC

## 2020-03-05 MED ORDER — FUROSEMIDE 20 MG PO TABS: 20.0000 mg | ORAL_TABLET | Freq: Every day | ORAL | 11 refills | Status: DC

## 2020-03-05 MED ORDER — METOPROLOL SUCCINATE ER 100 MG PO TB24: 100.0000 mg | ORAL_TABLET | Freq: Two times a day (BID) | ORAL | 3 refills | Status: DC

## 2020-03-05 MED ORDER — APIXABAN 5 MG PO TABS: 5.0000 mg | ORAL_TABLET | Freq: Two times a day (BID) | ORAL | 11 refills | Status: DC

## 2020-03-05 NOTE — Patient Instructions (Addendum)
Labs done today. We will contact you only if your labs are abnormal.  STOP taking Asprin  START Lasix 20mg (1 tablet) by mouth daily. TODAY TAKE 2 TABLETS ONLY.  START Eliquis 5mg (1 tablet) by mouth 2 times daily.  INCREASE metoprolol 100mg (1 tablet) by mouth 2 times daily.  No other medication changes were made. Please continue all other medications as prescribed.   Your physician recommends that you schedule a follow-up appointment in: 10 days for a lab only appointment and in 3 weeks for an appointment with Dr. Aundra Dubin.   If you have any questions or concerns before your next appointment please send [place] a message through [place] or call our office at [phone].    TO LEAVE A MESSAGE FOR THE NURSE SELECT OPTION 2, PLEASE LEAVE A MESSAGE INCLUDING: . YOUR NAME . DATE OF BIRTH . CALL BACK NUMBER . REASON FOR CALL**this is important as we prioritize the call backs  YOU WILL RECEIVE A CALL BACK THE SAME DAY AS LONG AS YOU CALL BEFORE 4:00 PM   You have been referred to [facility] electrophysiology at [address]. Their office will contact you to schedule an appointment.

## 2020-03-05 NOTE — Progress Notes (Signed)
  Echocardiogram 2D Echocardiogram has been performed.  Michiel Cowboy 03/05/2020, 9:56 AM

## 2020-03-06 NOTE — Progress Notes (Signed)
Patient ID: [name] [name], male   DOB: 1950-11-03, 69 y.o.   MRN: [id number] PCP: Dr. Birdie Riddle Cardiology: Dr. Aundra Dubin  69 y.o. with history of aortic insufficiency s/p Ross procedure and aortic root aneurysm presents for followup of Ross procedure.  He has a left-sided brachial plexopathy of uncertain etiology and has atrophy of his left shoulder and upper arm.  He developed weakness in his right hand as well as occasional vertigo-type spells.  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    Echo in 8/18 showed no regurgitation or stenosis of autograft in aortic position, bioprosthetic pulmonic valve with mild pulmonic stenosis, mean gradient 10 mmHg.  EF was read as 40-45%, which is lower than in the past.  MRA chest 3/19 showed 5.7 cm dilation of pulmonary autograft at level of sinuses of Valsalva, MRA chest in 6/20 showed 6 cm dilation of the pulmonary autograft at the level of the sinuses of valsalva.  He is being followed for aortic root aneurysm by Dr. Servando Snare.   MRA chest in 6/21 showed aortic root 5.4 cm, no change from prior. Echo was done today and reviewed, EF 50%, RV mildly enlarged with mildly decreased systolic function, pulmonary valve autograft in aortic position with mild regurgitation but no stenosis, bioprosthetic pulmonary valve with no significant stenosis or regurgitation, ascending aorta 5.3 cm.   Patient is noted to be in atrial flutter today, rate 110s.  Looking back, his HR was elevated in the 110s at physician appointments back to 5/21 (no ECGs).  Prior to that, HR was in 70s. He does not feel palpitations.  He has noted dyspnea walking up stairs for the last few months but no dyspnea walking on flat ground or with  yardwork.  No chest pain.  No lightheadedness.  No orthopnea/PND.    ECG (personally reviewed): Atypical atrial flutter rate 110s  Labs (2/14): K 3.7, creatinine 0.9, LDL 63, HDL 29 Labs (3/14): BNP 36 Labs (2/15): K 3.7, creatinine 0.8, LDL 61, HDL 30 Labs (4/16): K 3.7, creatinine 0.8, LDL 51, HDL 34 Labs (5/16): HCT 44.9 Labs (11/17): LDL 74, HDL 40, K 4.1, creatinine 0.77 Labs (11/18): LDL 75, HDL 35 Labs (2/19): K 4.2, creatinine 0.88 Labs (5/20): LDL 60, K 4.7, creatinine 0.81 Labs (5/21): TSH normal, LDL 71, K 4.7, creatinine 0.78  PMH: 1. Aortic insufficiency: s/p Ross procedure in 1994 in [place].   - Echo (4/11) with hypokinetic basal septum (likely post-surgical), EF 50%, mild LVH, s/p Ross procedure with native pulmonic valve in aortic position with mild aortic insufficiency and bioprosthetic pulmonic valve with no pulmonic insufficiency, mild MR, aortic upper normal in size.  - Echo (4/13) with EF 55%, mild LVH, mild AI, mild MR, mild RV dilation, bioprosthetic pulmonic valve with peak pressure 25 mmHg.   - Echo (3/14): Mild LV dilation, mild LVH, EF 60%, pulmonary valve in aortic position (s/p Ross) with mild AI and mildly dilated ascending aorta to 4.3 cm, mildly dilated RV with normal systolic function, bioprosthetic PVR with mean gradient 23 mmHg.  Echo (3/15) with EF 55-60%, mild LVH, mild AI with no AS, bioprosthetic pulmonic valve with peak gradient 31 mmHg, ascending aorta 4.4 cm.   - Cardiac MRI/MRA chest (  5/15) with mild regurgitation of autograft aortic valve and mild regurgitation of homograft pulmonic valve, dilation of aortic root at sinuses of valsalva (4.8 cm), EF 60%, no LGE, mild RV dilation with normal RV systolic function.   - Echo (5/16) with EF 55-60%, mild autograft AI, mild MR, mildly decreased RV systolic function, biatrial enlargement, RVSP 35 mmHg, trivially increased gradient across bioprosthetic pulmonic valve (no progression).  - Echo (8/17): EF  55-60%, pulmonary autograft in aortic valve position, mild AI, bioprosthetic pulmonary valve with mild PS (peak 22 mmHg), moderate RV dilation with normal systolic function.  - Echo (8/18): EF 40-45%, mildly increased gradient across bioprosthetic pulmonary valve (mean 10 mmHg), s/p Ross procedure with no stenosis or significant regurgitation of pulmonary autograft in aortic position.  - Echo (8/21): EF 50%, RV mildly enlarged with mildly decreased systolic function, pulmonary valve autograft in aortic position with mild regurgitation but no stenosis, bioprosthetic pulmonary valve with no significant stenosis or regurgitation, ascending aorta 5.3 cm.  2. Post-operative atrial fibrillation after heart surgery.  3. Normal left heart cath prior to 1994 heart surgery.  4. Left brachial plexopathy with left shoulder muscle atrophy.  Uncertain etiology.  5. BPH 6. Hyperlipidemia.  7. Cervical spinal stenosis with radiculopathy.   8. 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  9. ETT-Sestamibi (3/14): No ischemia or infarction.  10. Carotid dopplers (6/14): mild disease only.  11. Sinus of valsalva aneurysm: MRA chest 2/18 with dilation of pulmonary autograft at level of sinuses of Valsalva to 5.4 cm when measured into left cusp.  - MRA chest (3/19): Saccular dilatation of the left and right sinuses of valsalva, 5.7 cm aortic root diameter.   - MRA chest (6/20): 6 cm dilated sinuses of valsalva, 4.6 cm ascending aorta.  - MRA chest (6/21): aortic root 5.4 cm, no change from prior 12. Elevated left hemidiaphragm.  13. Atrial flutter: Atypical.  Noted in 8/21.   FH: Prostate CA, HTN  SH: Lives in [place], owns a window washing business, married, nonsmoker.   ROS: All systems reviewed and negative except as per HPI.    Current Outpatient Medications  Medication Sig Dispense Refill  . cetirizine (ZYRTEC) 10 MG tablet TAKE 1  TABLET(10 MG) BY MOUTH DAILY (Patient taking differently: Take 10 mg by mouth daily. ) 90 tablet 3  . finasteride (PROSCAR) 5 MG tablet Take 1 tablet (5 mg total) by mouth daily. 90 tablet 1  . fluticasone (FLONASE) 50 MCG/ACT nasal spray SHAKE LIQUID AND USE 2 SPRAYS IN EACH NOSTRIL DAILY (Patient taking differently: Place 2 sprays into both nostrils daily as needed for allergies. ) 16 g 6  . hydrocortisone (ANUSOL-HC) 25 MG suppository Place 1 suppository (25 mg total) rectally 2 (two) times daily. 24 suppository 0  . metoprolol succinate (TOPROL-XL) 100 MG 24 hr tablet Take 1 tablet (100 mg total) by mouth 2 (two) times daily. 180 tablet 3  . omeprazole (PRILOSEC) 40 MG capsule Take 1 capsule (40 mg total) by mouth daily. 30 capsule 3  . simvastatin (ZOCOR) 20 MG tablet TAKE 1 TABLET(20 MG) BY MOUTH DAILY (Patient taking differently: Take 20 mg by mouth daily. ) 90 tablet 1  . telmisartan (MICARDIS) 80 MG tablet Take 1 tablet (80 mg total) by mouth daily. 90 tablet 1  . apixaban (ELIQUIS) 5 MG TABS tablet Take 1 tablet (5 mg total) by mouth 2 (two) times daily. 60 tablet 11  .  furosemide (LASIX) 20 MG tablet Take 1 tablet (20 mg total) by mouth daily. 30 tablet 11  . potassium chloride SA (KLOR-CON) 10 MEQ tablet Take 1 tablet (10 mEq total) by mouth daily. 90 tablet 3  . Wheat Dextrin (BENEFIBER) POWD Take 2 Scoops by mouth daily.     No current facility-administered medications for this encounter.    BP 126/76   Pulse (!) 110   Ht 6\' 3"  (1.905 m)   Wt 110.4 kg (243 lb 6.4 oz)   SpO2 96%   BMI 30.42 kg/m  General: NAD Neck: JVP 8-9 cm, no thyromegaly or thyroid nodule.  Lungs: Slight crackles at bases.  CV: Nondisplaced PMI.  Heart tachy, regular S1/S2, +S3, no murmur.  1+ left ankle edema.  No carotid bruit.  Normal pedal pulses.  Abdomen: Soft, nontender, no hepatosplenomegaly, no distention.  Skin: Intact without lesions or rashes.  Neurologic: Alert and oriented x 3.  Psych:  Normal affect. Extremities: No clubbing or cyanosis.  HEENT: Normal.   Assessment/Plan: 1. Atrial flutter: This appears atypical.  Likely related to prior cardiac surgery.  He had peri-operative AF at time of heart surgery but has not been noted to have recurrent arrhythmias before today.  It actually looks like he has been in atrial flutter since at least 5/21 based on HR though ECGs were not done. EF stable on echo today, but he does have more dyspnea than baseline and is volume overloaded.  - He will start Eliquis 5 mg bid. Stop ASA.  - He will increase Toprol XL to 100 mg bid.  - We discussed waiting 1 month on Eliquis and then doing DCCV versus TEE-guided DCCV next week.  Given his preference and signs of the development of CHF on exam, we will go ahead with TEE-guided DCCV next week. I discussed risks/benefits and he agrees to procedure.  - I reviewed his ECG with Dr. Rayann Heman, would not likely be straightforward ablation.  2.  History of Ross Procedure: Echo done today and reviewed showed pulmonary  Autograft in aortic position with mild AI, no AS.  The bioprosthetic pulmonary valve was not significantly abnormal.  - Needs endocarditis prophylaxis with dental work.   3.  Aortic root aneurym: This not uncommonly accompanies Ross procedure.  The pulmonary autograft at sinuses of valsalva measured 5.4 cm by MRA chest in 6/21.  This was reviewed by Dr. Servando Snare and thought to have been stable over the last few years, continued observation.   - Per Dr. Servando Snare, would hold off on elective repair unless there is significant AI.   - Continue blood pressure control, goal SBP 120s or lower.  - Repeat MRA chest in 6/22.  4. HTN: Given aneurysm, need to make sure that BP is well-controlled. It is well-controlled today.  5. Hyperlipidemia: Lipids acceptable when recently checked.  6. Chronic diastolic CHF: Echo today with EF 50%, mildly decreased RV systolic function.  He is mildly volume overloaded today  with weight up 9 lbs in the setting of atrial flutter with RVR probably for several months.   - As above, arrange conversion to NSR.  - Lasix 40 mg x 1 today then 20 mg daily after that.  Add KCl 10 daily.  BMET today and again in 10 days.  - With mildly decreased EF, would like to transition telmisartan to Entresto.  Can do this at next appt.   Followup in 3 wks.   Loralie Champagne 03/06/2020

## 2020-03-06 NOTE — H&P (View-Only) (Signed)
Patient ID: [name] [name], male   DOB: [birth year], 69 y.o.   MRN: [id number] PCP: Dr. Birdie Riddle Cardiology: Dr. Aundra Dubin  69 y.o. with history of aortic insufficiency s/p Ross procedure and aortic root aneurysm presents for followup of Ross procedure.  He has a left-sided brachial plexopathy of uncertain etiology and has atrophy of his left shoulder and upper arm.  He developed weakness in his right hand as well as occasional vertigo-type spells.  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    Echo in 8/18 showed no regurgitation or stenosis of autograft in aortic position, bioprosthetic pulmonic valve with mild pulmonic stenosis, mean gradient 10 mmHg.  EF was read as 40-45%, which is lower than in the past.  MRA chest 3/19 showed 5.7 cm dilation of pulmonary autograft at level of sinuses of Valsalva, MRA chest in 6/20 showed 6 cm dilation of the pulmonary autograft at the level of the sinuses of valsalva.  He is being followed for aortic root aneurysm by Dr. Servando Snare.   MRA chest in 6/21 showed aortic root 5.4 cm, no change from prior. Echo was done today and reviewed, EF 50%, RV mildly enlarged with mildly decreased systolic function, pulmonary valve autograft in aortic position with mild regurgitation but no stenosis, bioprosthetic pulmonary valve with no significant stenosis or regurgitation, ascending aorta 5.3 cm.   Patient is noted to be in atrial flutter today, rate 110s.  Looking back, his HR was elevated in the 110s at physician appointments back to 5/21 (no ECGs).  Prior to that, HR was in 70s. He does not feel palpitations.  He has noted dyspnea walking up stairs for the last few months but no dyspnea walking on flat ground or with  yardwork.  No chest pain.  No lightheadedness.  No orthopnea/PND.    ECG (personally reviewed): Atypical atrial flutter rate 110s  Labs (2/14): K 3.7, creatinine 0.9, LDL 63, HDL 29 Labs (3/14): BNP 36 Labs (2/15): K 3.7, creatinine 0.8, LDL 61, HDL 30 Labs (4/16): K 3.7, creatinine 0.8, LDL 51, HDL 34 Labs (5/16): HCT 44.9 Labs (11/17): LDL 74, HDL 40, K 4.1, creatinine 0.77 Labs (11/18): LDL 75, HDL 35 Labs (2/19): K 4.2, creatinine 0.88 Labs (5/20): LDL 60, K 4.7, creatinine 0.81 Labs (5/21): TSH normal, LDL 71, K 4.7, creatinine 0.78  PMH: 1. Aortic insufficiency: s/p Ross procedure in 1994 in [facility].   - Echo (4/11) with hypokinetic basal septum (likely post-surgical), EF 50%, mild LVH, s/p Ross procedure with native pulmonic valve in aortic position with mild aortic insufficiency and bioprosthetic pulmonic valve with no pulmonic insufficiency, mild MR, aortic upper normal in size.  - Echo (4/13) with EF 55%, mild LVH, mild AI, mild MR, mild RV dilation, bioprosthetic pulmonic valve with peak pressure 25 mmHg.   - Echo (3/14): Mild LV dilation, mild LVH, EF 60%, pulmonary valve in aortic position (s/p Ross) with mild AI and mildly dilated ascending aorta to 4.3 cm, mildly dilated RV with normal systolic function, bioprosthetic PVR with mean gradient 23 mmHg.  Echo (3/15) with EF 55-60%, mild LVH, mild AI with no AS, bioprosthetic pulmonic valve with peak gradient 31 mmHg, ascending aorta 4.4 cm.   - Cardiac MRI/MRA chest (  5/15) with mild regurgitation of autograft aortic valve and mild regurgitation of homograft pulmonic valve, dilation of aortic root at sinuses of valsalva (4.8 cm), EF 60%, no LGE, mild RV dilation with normal RV systolic function.   - Echo (5/16) with EF 55-60%, mild autograft AI, mild MR, mildly decreased RV systolic function, biatrial enlargement, RVSP 35 mmHg, trivially increased gradient across bioprosthetic pulmonic valve (no progression).  - Echo (8/17): EF  55-60%, pulmonary autograft in aortic valve position, mild AI, bioprosthetic pulmonary valve with mild PS (peak 22 mmHg), moderate RV dilation with normal systolic function.  - Echo (8/18): EF 40-45%, mildly increased gradient across bioprosthetic pulmonary valve (mean 10 mmHg), s/p Ross procedure with no stenosis or significant regurgitation of pulmonary autograft in aortic position.  - Echo (8/21): EF 50%, RV mildly enlarged with mildly decreased systolic function, pulmonary valve autograft in aortic position with mild regurgitation but no stenosis, bioprosthetic pulmonary valve with no significant stenosis or regurgitation, ascending aorta 5.3 cm.  2. Post-operative atrial fibrillation after heart surgery.  3. Normal left heart cath prior to 1994 heart surgery.  4. Left brachial plexopathy with left shoulder muscle atrophy.  Uncertain etiology.  5. BPH 6. Hyperlipidemia.  7. Cervical spinal stenosis with radiculopathy.   8. 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  9. ETT-Sestamibi (3/14): No ischemia or infarction.  10. Carotid dopplers (6/14): mild disease only.  11. Sinus of valsalva aneurysm: MRA chest 2/18 with dilation of pulmonary autograft at level of sinuses of Valsalva to 5.4 cm when measured into left cusp.  - MRA chest (3/19): Saccular dilatation of the left and right sinuses of valsalva, 5.7 cm aortic root diameter.   - MRA chest (6/20): 6 cm dilated sinuses of valsalva, 4.6 cm ascending aorta.  - MRA chest (6/21): aortic root 5.4 cm, no change from prior 12. Elevated left hemidiaphragm.  13. Atrial flutter: Atypical.  Noted in 8/21.   FH: Prostate CA, HTN  SH: Lives in [place], owns a window washing business, married, nonsmoker.   ROS: All systems reviewed and negative except as per HPI.    Current Outpatient Medications  Medication Sig Dispense Refill  . cetirizine (ZYRTEC) 10 MG tablet TAKE 1  TABLET(10 MG) BY MOUTH DAILY (Patient taking differently: Take 10 mg by mouth daily. ) 90 tablet 3  . finasteride (PROSCAR) 5 MG tablet Take 1 tablet (5 mg total) by mouth daily. 90 tablet 1  . fluticasone (FLONASE) 50 MCG/ACT nasal spray SHAKE LIQUID AND USE 2 SPRAYS IN EACH NOSTRIL DAILY (Patient taking differently: Place 2 sprays into both nostrils daily as needed for allergies. ) 16 g 6  . hydrocortisone (ANUSOL-HC) 25 MG suppository Place 1 suppository (25 mg total) rectally 2 (two) times daily. 24 suppository 0  . metoprolol succinate (TOPROL-XL) 100 MG 24 hr tablet Take 1 tablet (100 mg total) by mouth 2 (two) times daily. 180 tablet 3  . omeprazole (PRILOSEC) 40 MG capsule Take 1 capsule (40 mg total) by mouth daily. 30 capsule 3  . simvastatin (ZOCOR) 20 MG tablet TAKE 1 TABLET(20 MG) BY MOUTH DAILY (Patient taking differently: Take 20 mg by mouth daily. ) 90 tablet 1  . telmisartan (MICARDIS) 80 MG tablet Take 1 tablet (80 mg total) by mouth daily. 90 tablet 1  . apixaban (ELIQUIS) 5 MG TABS tablet Take 1 tablet (5 mg total) by mouth 2 (two) times daily. 60 tablet 11  .  furosemide (LASIX) 20 MG tablet Take 1 tablet (20 mg total) by mouth daily. 30 tablet 11  . potassium chloride SA (KLOR-CON) 10 MEQ tablet Take 1 tablet (10 mEq total) by mouth daily. 90 tablet 3  . Wheat Dextrin (BENEFIBER) POWD Take 2 Scoops by mouth daily.     No current facility-administered medications for this encounter.    BP 126/76   Pulse (!) 110   Ht 6\' 3"  (1.905 m)   Wt 110.4 kg (243 lb 6.4 oz)   SpO2 96%   BMI 30.42 kg/m  General: NAD Neck: JVP 8-9 cm, no thyromegaly or thyroid nodule.  Lungs: Slight crackles at bases.  CV: Nondisplaced PMI.  Heart tachy, regular S1/S2, +S3, no murmur.  1+ left ankle edema.  No carotid bruit.  Normal pedal pulses.  Abdomen: Soft, nontender, no hepatosplenomegaly, no distention.  Skin: Intact without lesions or rashes.  Neurologic: Alert and oriented x 3.  Psych:  Normal affect. Extremities: No clubbing or cyanosis.  HEENT: Normal.   Assessment/Plan: 1. Atrial flutter: This appears atypical.  Likely related to prior cardiac surgery.  He had peri-operative AF at time of heart surgery but has not been noted to have recurrent arrhythmias before today.  It actually looks like he has been in atrial flutter since at least 5/21 based on HR though ECGs were not done. EF stable on echo today, but he does have more dyspnea than baseline and is volume overloaded.  - He will start Eliquis 5 mg bid. Stop ASA.  - He will increase Toprol XL to 100 mg bid.  - We discussed waiting 1 month on Eliquis and then doing DCCV versus TEE-guided DCCV next week.  Given his preference and signs of the development of CHF on exam, we will go ahead with TEE-guided DCCV next week. I discussed risks/benefits and he agrees to procedure.  - I reviewed his ECG with Dr. Rayann Heman, would not likely be straightforward ablation.  2.  History of Ross Procedure: Echo done today and reviewed showed pulmonary  Autograft in aortic position with mild AI, no AS.  The bioprosthetic pulmonary valve was not significantly abnormal.  - Needs endocarditis prophylaxis with dental work.   3.  Aortic root aneurym: This not uncommonly accompanies Ross procedure.  The pulmonary autograft at sinuses of valsalva measured 5.4 cm by MRA chest in 6/21.  This was reviewed by Dr. Servando Snare and thought to have been stable over the last few years, continued observation.   - Per Dr. Servando Snare, would hold off on elective repair unless there is significant AI.   - Continue blood pressure control, goal SBP 120s or lower.  - Repeat MRA chest in 6/22.  4. HTN: Given aneurysm, need to make sure that BP is well-controlled. It is well-controlled today.  5. Hyperlipidemia: Lipids acceptable when recently checked.  6. Chronic diastolic CHF: Echo today with EF 50%, mildly decreased RV systolic function.  He is mildly volume overloaded today  with weight up 9 lbs in the setting of atrial flutter with RVR probably for several months.   - As above, arrange conversion to NSR.  - Lasix 40 mg x 1 today then 20 mg daily after that.  Add KCl 10 daily.  BMET today and again in 10 days.  - With mildly decreased EF, would like to transition telmisartan to Entresto.  Can do this at next appt.   Followup in 3 wks.   Loralie Champagne 03/06/2020

## 2020-03-08 ENCOUNTER — Other Ambulatory Visit (HOSPITAL_COMMUNITY): Payer: Self-pay | Admitting: *Deleted

## 2020-03-08 ENCOUNTER — Other Ambulatory Visit: Payer: Self-pay | Admitting: Family Medicine

## 2020-03-08 DIAGNOSIS — I484 Atypical atrial flutter: Secondary | ICD-10-CM

## 2020-03-13 ENCOUNTER — Other Ambulatory Visit (HOSPITAL_COMMUNITY)
Admission: RE | Admit: 2020-03-13 | Discharge: 2020-03-13 | Disposition: A | Discharge status: Home / Self Care | Payer: Medicare Other | Source: Ambulatory Visit | Attending: Cardiology | Admitting: Cardiology

## 2020-03-13 DIAGNOSIS — Z01812 Encounter for preprocedural laboratory examination: Secondary | ICD-10-CM | POA: Insufficient documentation

## 2020-03-13 DIAGNOSIS — Z20822 Contact with and (suspected) exposure to covid-19: Secondary | ICD-10-CM | POA: Insufficient documentation

## 2020-03-13 LAB — SARS CORONAVIRUS 2 (TAT 6-24 HRS): SARS Coronavirus 2: NEGATIVE

## 2020-03-14 ENCOUNTER — Encounter (HOSPITAL_COMMUNITY): Payer: Self-pay | Admitting: Cardiology

## 2020-03-14 ENCOUNTER — Other Ambulatory Visit: Payer: Self-pay

## 2020-03-14 ENCOUNTER — Encounter (HOSPITAL_COMMUNITY)
Admission: RE | Disposition: A | Discharge status: Home / Self Care | Payer: Self-pay | Source: Home / Self Care | Attending: Cardiology

## 2020-03-14 ENCOUNTER — Ambulatory Visit (HOSPITAL_COMMUNITY): Payer: Medicare Other | Admitting: Certified Registered Nurse Anesthetist

## 2020-03-14 ENCOUNTER — Ambulatory Visit (HOSPITAL_BASED_OUTPATIENT_CLINIC_OR_DEPARTMENT_OTHER)
Admission: RE | Admit: 2020-03-14 | Discharge: 2020-03-14 | Disposition: A | Discharge status: Home / Self Care | Payer: Medicare Other | Source: Ambulatory Visit | Attending: Cardiology | Admitting: Cardiology

## 2020-03-14 ENCOUNTER — Ambulatory Visit (HOSPITAL_COMMUNITY)
Admission: RE | Admit: 2020-03-14 | Discharge: 2020-03-14 | Disposition: A | Discharge status: Home / Self Care | Payer: Medicare Other | Attending: Cardiology | Admitting: Cardiology

## 2020-03-14 ENCOUNTER — Telehealth: Payer: Self-pay | Admitting: Cardiology

## 2020-03-14 DIAGNOSIS — I4892 Unspecified atrial flutter: Secondary | ICD-10-CM

## 2020-03-14 DIAGNOSIS — I110 Hypertensive heart disease with heart failure: Secondary | ICD-10-CM | POA: Diagnosis not present

## 2020-03-14 DIAGNOSIS — I340 Nonrheumatic mitral (valve) insufficiency: Secondary | ICD-10-CM

## 2020-03-14 DIAGNOSIS — E785 Hyperlipidemia, unspecified: Secondary | ICD-10-CM | POA: Insufficient documentation

## 2020-03-14 DIAGNOSIS — Z79899 Other long term (current) drug therapy: Secondary | ICD-10-CM | POA: Insufficient documentation

## 2020-03-14 DIAGNOSIS — I5032 Chronic diastolic (congestive) heart failure: Secondary | ICD-10-CM | POA: Diagnosis not present

## 2020-03-14 DIAGNOSIS — N400 Enlarged prostate without lower urinary tract symptoms: Secondary | ICD-10-CM | POA: Diagnosis not present

## 2020-03-14 DIAGNOSIS — Z7901 Long term (current) use of anticoagulants: Secondary | ICD-10-CM | POA: Insufficient documentation

## 2020-03-14 DIAGNOSIS — I484 Atypical atrial flutter: Secondary | ICD-10-CM

## 2020-03-14 HISTORY — PX: CARDIOVERSION: SHX1299

## 2020-03-14 HISTORY — PX: TEE WITHOUT CARDIOVERSION: SHX5443

## 2020-03-14 LAB — POCT I-STAT, CHEM 8
BUN: 21 mg/dL (ref 8–23)
Calcium, Ion: 1.16 mmol/L (ref 1.15–1.40)
Chloride: 104 mmol/L (ref 98–111)
Creatinine, Ser: 0.8 mg/dL (ref 0.61–1.24)
Glucose, Bld: 149 mg/dL — ABNORMAL HIGH (ref 70–99)
HCT: 44 % (ref 39.0–52.0)
Hemoglobin: 15 g/dL (ref 13.0–17.0)
Potassium: 4.4 mmol/L (ref 3.5–5.1)
Sodium: 140 mmol/L (ref 135–145)
TCO2: 23 mmol/L (ref 22–32)

## 2020-03-14 SURGERY — ECHOCARDIOGRAM, TRANSESOPHAGEAL
Anesthesia: Monitor Anesthesia Care

## 2020-03-14 MED ORDER — LIDOCAINE 2% (20 MG/ML) 5 ML SYRINGE
INTRAMUSCULAR | Status: DC | PRN
  Administered 2020-03-14: 40 mg via INTRAVENOUS

## 2020-03-14 MED ORDER — PROPOFOL 10 MG/ML IV BOLUS
INTRAVENOUS | Status: DC | PRN
  Administered 2020-03-14: 60 mg via INTRAVENOUS

## 2020-03-14 MED ORDER — SODIUM CHLORIDE 0.9 % IV SOLN: INTRAVENOUS | Status: DC

## 2020-03-14 MED ORDER — PROPOFOL 500 MG/50ML IV EMUL
INTRAVENOUS | Status: DC | PRN
  Administered 2020-03-14: 100 ug/kg/min via INTRAVENOUS

## 2020-03-14 NOTE — Anesthesia Procedure Notes (Signed)
Procedure Name: MAC Date/Time: 03/14/2020 10:18 AM Performed by: Leonor Liv, CRNA Pre-anesthesia Checklist: Patient identified, Emergency Drugs available, Suction available, Patient being monitored and Timeout performed Oxygen Delivery Method: Nasal cannula Placement Confirmation: positive ETCO2 Dental Injury: Teeth and Oropharynx as per pre-operative assessment

## 2020-03-14 NOTE — Anesthesia Preprocedure Evaluation (Signed)
Anesthesia Evaluation  Patient identified by MRN, date of birth, ID band Patient awake    Reviewed: Allergy & Precautions, NPO status , Patient's Chart, lab work & pertinent test results  Airway Mallampati: II  TM Distance: >3 FB Neck ROM: Full    Dental  (+) Teeth Intact, Dental Advisory Given   Pulmonary    breath sounds clear to auscultation       Cardiovascular  Rhythm:Irregular Rate:Normal     Neuro/Psych    GI/Hepatic   Endo/Other    Renal/GU      Musculoskeletal   Abdominal   Peds  Hematology   Anesthesia Other Findings   Reproductive/Obstetrics                             Anesthesia Physical Anesthesia Plan  ASA: III  Anesthesia Plan: MAC   Post-op Pain Management:    Induction: Intravenous  PONV Risk Score and Plan: Ondansetron and Propofol infusion  Airway Management Planned: Natural Airway and Simple Face Mask  Additional Equipment:   Intra-op Plan:   Post-operative Plan:   Informed Consent: I have reviewed the patients History and Physical, chart, labs and discussed the procedure including the risks, benefits and alternatives for the proposed anesthesia with the patient or authorized representative who has indicated his/her understanding and acceptance.     Dental advisory given  Plan Discussed with: CRNA and Anesthesiologist  Anesthesia Plan Comments:         Anesthesia Quick Evaluation

## 2020-03-14 NOTE — Discharge Instructions (Signed)
Electrical Cardioversion Electrical cardioversion is the delivery of a jolt of electricity to restore a normal rhythm to the heart. A rhythm that is too fast or is not regular keeps the heart from pumping well. In this procedure, sticky patches or metal paddles are placed on the chest to deliver electricity to the heart from a device. This procedure may be done in an emergency if:  There is low or no blood pressure as a result of the heart rhythm.  Normal rhythm must be restored as fast as possible to protect the brain and heart from further damage.  It may save a life. This may also be a scheduled procedure for irregular or fast heart rhythms that are not immediately life-threatening. Tell a health care provider about:  Any allergies you have.  All medicines you are taking, including vitamins, herbs, eye drops, creams, and over-the-counter medicines.  Any problems you or family members have had with anesthetic medicines.  Any blood disorders you have.  Any surgeries you have had.  Any medical conditions you have.  Whether you are pregnant or may be pregnant. What are the risks? Generally, this is a safe procedure. However, problems may occur, including:  Allergic reactions to medicines.  A blood clot that breaks free and travels to other parts of your body.  The possible return of an abnormal heart rhythm within hours or days after the procedure.  Your heart stopping (cardiac arrest). This is rare. What happens before the procedure? Medicines  Your health care provider may have you start taking: ? Blood-thinning medicines (anticoagulants) so your blood does not clot as easily. ? Medicines to help stabilize your heart rate and rhythm.  Ask your health care provider about: ? Changing or stopping your regular medicines. This is especially important if you are taking diabetes medicines or blood thinners. ? Taking medicines such as aspirin and ibuprofen. These medicines can  thin your blood. Do not take these medicines unless your health care provider tells you to take them. ? Taking over-the-counter medicines, vitamins, herbs, and supplements. General instructions  Follow instructions from your health care provider about eating or drinking restrictions.  Plan to have someone take you home from the hospital or clinic.  If you will be going home right after the procedure, plan to have someone with you for 24 hours.  Ask your health care provider what steps will be taken to help prevent infection. These may include washing your skin with a germ-killing soap. What happens during the procedure?   An IV will be inserted into one of your veins.  Sticky patches (electrodes) or metal paddles may be placed on your chest.  You will be given a medicine to help you relax (sedative).  An electrical shock will be delivered. The procedure may vary among health care providers and hospitals. What can I expect after the procedure?  Your blood pressure, heart rate, breathing rate, and blood oxygen level will be monitored until you leave the hospital or clinic.  Your heart rhythm will be watched to make sure it does not change.  You may have some redness on the skin where the shocks were given. Follow these instructions at home:  Do not drive for 24 hours if you were given a sedative during your procedure.  Take over-the-counter and prescription medicines only as told by your health care provider.  Ask your health care provider how to check your pulse. Check it often.  Rest for 48 hours after the procedure or   as told by your health care provider.  Avoid or limit your caffeine use as told by your health care provider.  Keep all follow-up visits as told by your health care provider. This is important. Contact a health care provider if:  You feel like your heart is beating too quickly or your pulse is not regular.  You have a serious muscle cramp that does not go  away. Get help right away if:  You have discomfort in your chest.  You are dizzy or you feel faint.  You have trouble breathing or you are short of breath.  Your speech is slurred.  You have trouble moving an arm or leg on one side of your body.  Your fingers or toes turn cold or blue. Summary  Electrical cardioversion is the delivery of a jolt of electricity to restore a normal rhythm to the heart.  This procedure may be done right away in an emergency or may be a scheduled procedure if the condition is not an emergency.  Generally, this is a safe procedure.  After the procedure, check your pulse often as told by your health care provider. This information is not intended to replace advice given to you by your health care provider. Make sure you discuss any questions you have with your health care provider. Document Revised: 02/20/2019 Document Reviewed: 02/20/2019 Elsevier Patient Education  2020 [employer] Inc[employer]  

## 2020-03-14 NOTE — CV Procedure (Signed)
Procedure: TEE  Indication: Atrial flutter  Sedation: Per anesthesiology.   Findings: Please see echo section for full report.  Normal LV size and wall thickness.  EF 40%, diffuse hypokinesis.  Mildly dilated RV with mildly decreased systolic function.  Normal left atrial size, no LA appendage thrombus. Normal right atrium.  No PFO/ASD by color doppler.  Mild TR, peak RV-RA gradient 18 mmHg. Bioprosthetic pulmonary valve with no pulmonic insufficiency.  The patient is s/p Ross procedure with pulmonary autograft in the aortic position, there is mild aortic insufficiency.  The valve opens well, no visual stenosis (unable to measure gradient).  Mild eccentric mitral regurgitation.  The aortic root was dilated to 4.9 cm.    Impression: No LA appendage thrombus, proceed to DCCV.   [name] [name] 03/14/2020 10:49 AM

## 2020-03-14 NOTE — Transfer of Care (Signed)
Immediate Anesthesia Transfer of Care Note  Patient: [name] [name]  Procedure(s) Performed: TRANSESOPHAGEAL ECHOCARDIOGRAM (TEE) (N/A ) CARDIOVERSION (N/A )  Patient Location: Endoscopy Unit  Anesthesia Type:MAC  Level of Consciousness: drowsy and patient cooperative  Airway & Oxygen Therapy: Patient Spontanous Breathing  Post-op Assessment: Report given to RN, Post -op Vital signs reviewed and stable and Patient moving all extremities  Post vital signs: Reviewed and stable  Last Vitals:  Vitals Value Taken Time  BP 99/80 03/14/20 1054  Temp    Pulse 63 03/14/20 1056  Resp 19 03/14/20 1056  SpO2 97 % 03/14/20 1056  Vitals shown include unvalidated device data.  Last Pain:  Vitals:   03/14/20 0937  TempSrc: Oral  PainSc: 0-No pain         Complications: No complications documented.

## 2020-03-14 NOTE — Procedures (Signed)
Electrical Cardioversion Procedure Note [name] [name] [id number] 1950/12/12  Procedure: Electrical Cardioversion Indications:  Atrial Flutter  Procedure Details Consent: Risks of procedure as well as the alternatives and risks of each were explained to the (patient/caregiver).  Consent for procedure obtained. Time Out: Verified patient identification, verified procedure, site/side was marked, verified correct patient position, special equipment/implants available, medications/allergies/relevent history reviewed, required imaging and test results available.  Performed  Patient placed on cardiac monitor, pulse oximetry, supplemental oxygen as necessary.  Sedation given: Per anesthesiology Pacer pads placed anterior and posterior chest.  Cardioverted 1 time(s).  Cardioverted at 150J.  Evaluation Findings: Post procedure EKG shows: NSR Complications: None Patient did tolerate procedure well.   Loralie Champagne 03/14/2020, 10:49 AM

## 2020-03-14 NOTE — Telephone Encounter (Signed)
New message:    Patient had a test this morning and patient throat is hurting and would like to know if can take something for that. Please call patient back.

## 2020-03-14 NOTE — Anesthesia Postprocedure Evaluation (Signed)
Anesthesia Post Note  Patient: [name] [name]  Procedure(s) Performed: TRANSESOPHAGEAL ECHOCARDIOGRAM (TEE) (N/A ) CARDIOVERSION (N/A )     Patient location during evaluation: Endoscopy Anesthesia Type: MAC Level of consciousness: awake and alert Pain management: pain level controlled Vital Signs Assessment: post-procedure vital signs reviewed and stable Respiratory status: spontaneous breathing, nonlabored ventilation, respiratory function stable and patient connected to nasal cannula oxygen Cardiovascular status: stable and blood pressure returned to baseline Postop Assessment: no apparent nausea or vomiting Anesthetic complications: no   No complications documented.  Last Vitals:  Vitals:   03/14/20 1105 03/14/20 1115  BP: 101/81 112/80  Pulse: (!) 57 64  Resp: 18 15  Temp: 36.7 C   SpO2: 98% 97%    Last Pain:  Vitals:   03/14/20 1105  TempSrc: Axillary  PainSc: 0-No pain                 Abeni Finchum COKER

## 2020-03-14 NOTE — Progress Notes (Signed)
  Echocardiogram Echocardiogram Transesophageal has been performed.  Bobbye Charleston 03/14/2020, 10:52 AM

## 2020-03-14 NOTE — Telephone Encounter (Signed)
Patient advised and verbalized understanding 

## 2020-03-14 NOTE — Telephone Encounter (Signed)
See below, please advise in DM absence,pt had a tee done this morning by DM.

## 2020-03-14 NOTE — Interval H&P Note (Signed)
History and Physical Interval Note:  03/14/2020 10:25 AM  [name] [name]  has presented today for surgery, with the diagnosis of AFLUTTER.  The various methods of treatment have been discussed with the patient and family. After consideration of risks, benefits and other options for treatment, the patient has consented to  Procedure(s): TRANSESOPHAGEAL ECHOCARDIOGRAM (TEE) (N/A) CARDIOVERSION (N/A) as a surgical intervention.  The patient's history has been reviewed, patient examined, no change in status, stable for surgery.  I have reviewed the patient's chart and labs.  Questions were answered to the patient's satisfaction.     Remus Hagedorn [employer]

## 2020-03-15 ENCOUNTER — Ambulatory Visit (HOSPITAL_COMMUNITY)
Admission: RE | Admit: 2020-03-15 | Discharge: 2020-03-15 | Disposition: A | Discharge status: Home / Self Care | Payer: Medicare Other | Source: Ambulatory Visit | Attending: Cardiology | Admitting: Cardiology

## 2020-03-15 ENCOUNTER — Telehealth (HOSPITAL_COMMUNITY): Payer: Self-pay | Admitting: Pharmacy Technician

## 2020-03-15 ENCOUNTER — Encounter (HOSPITAL_COMMUNITY): Payer: Self-pay | Admitting: Cardiology

## 2020-03-15 VITALS — BP 120/90 | HR 68 | Wt 241.0 lb

## 2020-03-15 DIAGNOSIS — I5022 Chronic systolic (congestive) heart failure: Secondary | ICD-10-CM | POA: Diagnosis not present

## 2020-03-15 DIAGNOSIS — E785 Hyperlipidemia, unspecified: Secondary | ICD-10-CM | POA: Insufficient documentation

## 2020-03-15 DIAGNOSIS — R0602 Shortness of breath: Secondary | ICD-10-CM | POA: Insufficient documentation

## 2020-03-15 DIAGNOSIS — R0609 Other forms of dyspnea: Secondary | ICD-10-CM | POA: Diagnosis not present

## 2020-03-15 DIAGNOSIS — I10 Essential (primary) hypertension: Secondary | ICD-10-CM

## 2020-03-15 DIAGNOSIS — I4891 Unspecified atrial fibrillation: Secondary | ICD-10-CM | POA: Diagnosis not present

## 2020-03-15 DIAGNOSIS — Z79899 Other long term (current) drug therapy: Secondary | ICD-10-CM | POA: Insufficient documentation

## 2020-03-15 DIAGNOSIS — Z954 Presence of other heart-valve replacement: Secondary | ICD-10-CM

## 2020-03-15 DIAGNOSIS — I110 Hypertensive heart disease with heart failure: Secondary | ICD-10-CM | POA: Diagnosis not present

## 2020-03-15 DIAGNOSIS — I351 Nonrheumatic aortic (valve) insufficiency: Secondary | ICD-10-CM | POA: Diagnosis not present

## 2020-03-15 DIAGNOSIS — I4892 Unspecified atrial flutter: Secondary | ICD-10-CM | POA: Insufficient documentation

## 2020-03-15 DIAGNOSIS — Z7901 Long term (current) use of anticoagulants: Secondary | ICD-10-CM | POA: Diagnosis not present

## 2020-03-15 LAB — BASIC METABOLIC PANEL
Anion gap: 11 (ref 5–15)
BUN: 21 mg/dL (ref 8–23)
CO2: 25 mmol/L (ref 22–32)
Calcium: 9.5 mg/dL (ref 8.9–10.3)
Chloride: 103 mmol/L (ref 98–111)
Creatinine, Ser: 0.99 mg/dL (ref 0.61–1.24)
GFR calc Af Amer: >60 mL/min (ref >60)
GFR calc non Af Amer: >60 mL/min (ref >60)
Glucose, Bld: 158 mg/dL — ABNORMAL HIGH (ref 70–99)
Potassium: 4.6 mmol/L (ref 3.5–5.1)
Sodium: 139 mmol/L (ref 135–145)

## 2020-03-15 MED ORDER — ENTRESTO 24-26 MG PO TABS: 1.0000 | ORAL_TABLET | Freq: Two times a day (BID) | ORAL | 3 refills | Status: DC

## 2020-03-15 MED ORDER — POTASSIUM CHLORIDE CRYS ER 20 MEQ PO TBCR: 20.0000 meq | EXTENDED_RELEASE_TABLET | Freq: Every day | ORAL | 6 refills | Status: DC

## 2020-03-15 MED ORDER — FUROSEMIDE 20 MG PO TABS: 40.0000 mg | ORAL_TABLET | Freq: Every day | ORAL | 6 refills | Status: DC

## 2020-03-15 NOTE — Progress Notes (Signed)
ReDS Vest / Clip - 03/15/20 0900      ReDS Vest / Clip   Station Marker D    Ruler Value 34    ReDS Value Range Moderate volume overload    ReDS Actual Value 39    Anatomical Comments Sitting

## 2020-03-15 NOTE — Patient Instructions (Addendum)
INCREASE Lasix 76meq (2 tablets) Daily    INCREASE Potassium 4meq (1 tablet) Daily    START Entresto 24/26 (1 tablet) Twice daily    STOP taking Telmisartan   Return next week for labs and your follow up appointment with the Nurse Practitioner    If you have any questions or concerns before your next appointment please send [place] a message through [place] or call our office at [phone].    TO LEAVE A MESSAGE FOR THE NURSE SELECT OPTION 2, PLEASE LEAVE A MESSAGE INCLUDING:  YOUR NAME  DATE OF BIRTH  CALL BACK NUMBER  REASON FOR CALL**this is important as we prioritize the call backs  YOU WILL RECEIVE A CALL BACK THE SAME DAY AS LONG AS YOU CALL BEFORE 4:00 PM   At the [facility], you and your health needs are our priority. As part of our continuing mission to provide you with exceptional heart care, we have created designated Provider Care Teams. These Care Teams include your primary Cardiologist (physician) and Advanced Practice Providers (APPs- Physician Assistants and Nurse Practitioners) who all work together to provide you with the care you need, when you need it.   You may see any of the following providers on your designated Care Team at your next follow up:  Dr Glori Bickers  Dr Haynes Kerns, NP  Lyda Jester, [place]  Audry Riles, PharmD   Please be sure to bring in all your medications bottles to every appointment.

## 2020-03-15 NOTE — Telephone Encounter (Signed)
Patient was started on Entresto. His current 30 day co-pay is $25.  Charlann Boxer, CPhT

## 2020-03-15 NOTE — Addendum Note (Signed)
Encounter addended by: Malena Edman, RN on: 03/15/2020 8:52 AM  Actions taken: Order list changed

## 2020-03-16 NOTE — Progress Notes (Signed)
Patient ID: [name] [name], male   DOB: 08/08/1950, 69 y.o.   MRN: [id number] PCP: Dr. Birdie Riddle Cardiology: Dr. Aundra Dubin  69 y.o. with history of aortic insufficiency s/p Ross procedure and aortic root aneurysm presents for followup of Ross procedure.  He has a left-sided brachial plexopathy of uncertain etiology and has atrophy of his left shoulder and upper arm.  He developed weakness in his right hand as well as occasional vertigo-type spells.  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    Echo in 8/18 showed no regurgitation or stenosis of autograft in aortic position, bioprosthetic pulmonic valve with mild pulmonic stenosis, mean gradient 10 mmHg.  EF was read as 40-45%, which is lower than in the past.  MRA chest 3/19 showed 5.7 cm dilation of pulmonary autograft at level of sinuses of Valsalva, MRA chest in 6/20 showed 6 cm dilation of the pulmonary autograft at the level of the sinuses of valsalva.  He is being followed for aortic root aneurysm by Dr. Servando Snare.   MRA chest in 6/21 showed aortic root 5.4 cm, no change from prior. Echo was done today and reviewed, EF 50%, RV mildly enlarged with mildly decreased systolic function, pulmonary valve autograft in aortic position with mild regurgitation but no stenosis, bioprosthetic pulmonary valve with no significant stenosis or regurgitation, ascending aorta 5.3 cm.   Patient is noted to be in atrial flutter today at last appointment, rate 110s.  Looking back, his HR was elevated in the 110s at physician appointments back to 5/21 (no ECGs).  I took him for TEE-guided DCCV earlier this week.  TEE showed EF 40%, mildly decreased RV function, aortic valve replaced by pulmonary autograft with  mild AI and no AS, bioprosthetic PV looked stable.  No LA appendage thrombus, he underwent DCCV.  Today, he remains in NSR.  However, he had severe orthopnea last night and has been short of breath walking short distances today. At last appointment, I started him on Lasix 20 mg daily.    REDS clip 39%    ECG (personally reviewed): NSR, 1st degree AVB, RBBB  Labs (2/14): K 3.7, creatinine 0.9, LDL 63, HDL 29 Labs (3/14): BNP 36 Labs (2/15): K 3.7, creatinine 0.8, LDL 61, HDL 30 Labs (4/16): K 3.7, creatinine 0.8, LDL 51, HDL 34 Labs (5/16): HCT 44.9 Labs (11/17): LDL 74, HDL 40, K 4.1, creatinine 0.77 Labs (11/18): LDL 75, HDL 35 Labs (2/19): K 4.2, creatinine 0.88 Labs (5/20): LDL 60, K 4.7, creatinine 0.81 Labs (5/21): TSH normal, LDL 71, K 4.7, creatinine 0.78 Labs (8/21): K 4.3, creatinine 0.75  PMH: 1. Aortic insufficiency: s/p Ross procedure in 1994 in [place].   - Echo (4/11) with hypokinetic basal septum (likely post-surgical), EF 50%, mild LVH, s/p Ross procedure with native pulmonic valve in aortic position with mild aortic insufficiency and bioprosthetic pulmonic valve with no pulmonic insufficiency, mild MR, aortic upper normal in size.  - Echo (4/13) with EF 55%, mild LVH, mild AI, mild MR, mild RV dilation, bioprosthetic pulmonic valve with peak pressure 25 mmHg.   - Echo (3/14): Mild LV dilation, mild LVH, EF 60%, pulmonary valve in aortic position (s/p Ross) with mild AI and mildly dilated ascending aorta to  4.3 cm, mildly dilated RV with normal systolic function, bioprosthetic PVR with mean gradient 23 mmHg.  Echo (3/15) with EF 55-60%, mild LVH, mild AI with no AS, bioprosthetic pulmonic valve with peak gradient 31 mmHg, ascending aorta 4.4 cm.   - Cardiac MRI/MRA chest (5/15) with mild regurgitation of autograft aortic valve and mild regurgitation of homograft pulmonic valve, dilation of aortic root at sinuses of valsalva (4.8 cm), EF 60%, no LGE, mild RV dilation with  normal RV systolic function.   - Echo (5/16) with EF 55-60%, mild autograft AI, mild MR, mildly decreased RV systolic function, biatrial enlargement, RVSP 35 mmHg, trivially increased gradient across bioprosthetic pulmonic valve (no progression).  - Echo (8/17): EF 55-60%, pulmonary autograft in aortic valve position, mild AI, bioprosthetic pulmonary valve with mild PS (peak 22 mmHg), moderate RV dilation with normal systolic function.  - Echo (8/18): EF 40-45%, mildly increased gradient across bioprosthetic pulmonary valve (mean 10 mmHg), s/p Ross procedure with no stenosis or significant regurgitation of pulmonary autograft in aortic position.  - Echo (8/21): EF 50%, RV mildly enlarged with mildly decreased systolic function, pulmonary valve autograft in aortic position with mild regurgitation but no stenosis, bioprosthetic pulmonary valve with no significant stenosis or regurgitation, ascending aorta 5.3 cm.  - TEE (8/21): EF 40%, mildly decreased RV function, aortic valve replaced by pulmonary autograft with mild AI and no AS, bioprosthetic PV looked stable. 2. Post-operative atrial fibrillation after heart surgery.  3. Normal left heart cath prior to 1994 heart surgery.  4. Left brachial plexopathy with left shoulder muscle atrophy.  Uncertain etiology.  5. BPH 6. Hyperlipidemia.  7. Cervical spinal stenosis with radiculopathy.   8. 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  9. ETT-Sestamibi (3/14): No ischemia or infarction.  10. Carotid dopplers (6/14): mild disease only.  11. Sinus of valsalva aneurysm: MRA chest 2/18 with dilation of pulmonary autograft at level of sinuses of Valsalva to 5.4 cm when measured into left cusp.  - MRA chest (3/19): Saccular dilatation of the left and right sinuses of valsalva, 5.7 cm aortic root diameter.   - MRA chest (6/20): 6 cm dilated sinuses of valsalva, 4.6 cm ascending aorta.  - MRA  chest (6/21): aortic root 5.4 cm, no change from prior 12. Elevated left hemidiaphragm.  13. Atrial flutter: Atypical.  Noted in 8/21 => underwent DCCV to NSR. Marland Kitchen   FH: Prostate CA, HTN  SH: Lives in [place], owns a window washing business, married, nonsmoker.   ROS: All systems reviewed and negative except as per HPI.    Current Outpatient Medications  Medication Sig Dispense Refill  . apixaban (ELIQUIS) 5 MG TABS tablet Take 1 tablet (5 mg total) by mouth 2 (two) times daily. 60 tablet 11  . cetirizine (ZYRTEC) 10 MG tablet Take 10 mg by mouth daily.    . finasteride (PROSCAR) 5 MG tablet TAKE 1 TABLET(5 MG) BY MOUTH DAILY 90 tablet 1  . fluticasone (FLONASE) 50 MCG/ACT nasal spray Place 2 sprays into both nostrils as needed for allergies or rhinitis. SHAKE LIQUID AND USE 2 SPRAYS DAILY AS NEEDED    . furosemide (LASIX) 20 MG tablet Take 2 tablets (40 mg total) by mouth daily. 30 tablet 6  . hydrocortisone (ANUSOL-HC) 25 MG suppository Place 1 suppository (25 mg total) rectally 2 (two) times daily. 24 suppository 0  . metoprolol succinate (TOPROL-XL) 100 MG 24 hr tablet Take 1  tablet (100 mg total) by mouth 2 (two) times daily. 180 tablet 3  . omeprazole (PRILOSEC) 40 MG capsule Take 1 capsule (40 mg total) by mouth daily. 30 capsule 3  . potassium chloride (KLOR-CON) 20 MEQ tablet Take 1 tablet (20 mEq total) by mouth daily. 30 tablet 6  . simvastatin (ZOCOR) 20 MG tablet Take 20 mg by mouth daily.    . Wheat Dextrin (BENEFIBER) POWD Take 2 Scoops by mouth daily.    . sacubitril-valsartan (ENTRESTO) 24-26 MG Take 1 tablet by mouth 2 (two) times daily. 60 tablet 3   No current facility-administered medications for this encounter.    BP 120/90   Pulse 68   Wt 109.3 kg (241 lb)   SpO2 98%   BMI 30.12 kg/m  General: NAD Neck: JVP 8-9 cm, no thyromegaly or thyroid nodule.  Lungs: Mild crackles at bases. CV: Nondisplaced PMI.  Heart regular S1/S2, no S3/S4, 1/6 SEM RUSB.  No  peripheral edema.  No carotid bruit.  Normal pedal pulses.  Abdomen: Soft, nontender, no hepatosplenomegaly, no distention.  Skin: Intact without lesions or rashes.  Neurologic: Alert and oriented x 3.  Psych: Normal affect. Extremities: No clubbing or cyanosis.  HEENT: Normal.   Assessment/Plan: 1. Atrial flutter:  Likely related to prior cardiac surgery.  He had peri-operative AF at time of heart surgery but has not been noted to have recurrent arrhythmias before today.  In 8/21, he was noted to be in atrial flutter with RVR, based on review of HR over the past few months, he had probably been in atrial flutter since at least 5/21. He had DCCV in 8/21 and is in NSR today.  - Continue Eliquis 5 mg bid.  - Continue Toprol XL 100 mg bid.  - I reviewed his ECG with Dr. Rayann Heman, would not likely be straightforward ablation. I have referred him to EP for formal evaluation.  2.  History of Ross Procedure: Echo and TEE in 8/21 showed pulmonary autograft in aortic position with mild AI, no AS.  The bioprosthetic pulmonary valve was not significantly abnormal.  - Needs endocarditis prophylaxis with dental work.   3.  Aortic root aneurym: This not uncommonly accompanies Ross procedure.  The pulmonary autograft at sinuses of valsalva measured 5.4 cm by MRA chest in 6/21.  This was reviewed by Dr. Servando Snare and thought to have been stable over the last few years, continued observation.   - Per Dr. Servando Snare, would hold off on elective repair unless there is significant AI.   - Continue blood pressure control, goal SBP 120s or lower.  - Repeat MRA chest in 6/22.  4. HTN: Given aneurysm, need to make sure that BP is well-controlled. It is well-controlled today.  5. Hyperlipidemia: Lipids acceptable when recently checked.  6. Chronic systolic CHF: TEE in setting of long-standing atrial flutter with RVR showed EF down to 40%. 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  He was cardioverted yesterday but had orthopnea overnight  and increased exertional dyspnea today. REDS clip elevated.  Volume overloaded on exam as well.   - Increase Lasix to 40 mg daily and KCl to 20 daily. BMET in 1 week.  - Stop telmisartan and start Entresto 24/26 bid.  BMET 1 week.  - Repeat echo with patient in NSR to see if EF improves.   Followup next week with NP/PA.   Loralie Champagne 03/16/2020

## 2020-03-18 LAB — HM DIABETES EYE EXAM

## 2020-03-21 ENCOUNTER — Other Ambulatory Visit: Payer: Self-pay

## 2020-03-21 ENCOUNTER — Ambulatory Visit (HOSPITAL_COMMUNITY)
Admission: RE | Admit: 2020-03-21 | Discharge: 2020-03-21 | Disposition: A | Discharge status: Home / Self Care | Payer: Medicare Other | Source: Ambulatory Visit | Attending: Internal Medicine | Admitting: Internal Medicine

## 2020-03-21 ENCOUNTER — Encounter (HOSPITAL_COMMUNITY): Payer: Self-pay

## 2020-03-21 VITALS — BP 138/86 | HR 75 | Wt 238.6 lb

## 2020-03-21 DIAGNOSIS — I110 Hypertensive heart disease with heart failure: Secondary | ICD-10-CM | POA: Diagnosis not present

## 2020-03-21 DIAGNOSIS — I4891 Unspecified atrial fibrillation: Secondary | ICD-10-CM | POA: Insufficient documentation

## 2020-03-21 DIAGNOSIS — Z79899 Other long term (current) drug therapy: Secondary | ICD-10-CM | POA: Insufficient documentation

## 2020-03-21 DIAGNOSIS — E785 Hyperlipidemia, unspecified: Secondary | ICD-10-CM | POA: Diagnosis not present

## 2020-03-21 DIAGNOSIS — Q2543 Congenital aneurysm of aorta: Secondary | ICD-10-CM | POA: Diagnosis not present

## 2020-03-21 DIAGNOSIS — I5022 Chronic systolic (congestive) heart failure: Secondary | ICD-10-CM | POA: Diagnosis not present

## 2020-03-21 DIAGNOSIS — I4892 Unspecified atrial flutter: Secondary | ICD-10-CM | POA: Insufficient documentation

## 2020-03-21 DIAGNOSIS — Z7901 Long term (current) use of anticoagulants: Secondary | ICD-10-CM | POA: Diagnosis not present

## 2020-03-21 LAB — CBC
HCT: 46.9 % (ref 39.0–52.0)
Hemoglobin: 15.3 g/dL (ref 13.0–17.0)
MCH: 30.4 pg (ref 26.0–34.0)
MCHC: 32.6 g/dL (ref 30.0–36.0)
MCV: 93.2 fL (ref 80.0–100.0)
Platelets: 252 10*3/uL (ref 150–400)
RBC: 5.03 MIL/uL (ref 4.22–5.81)
RDW: 13 % (ref 11.5–15.5)
WBC: 9.9 10*3/uL (ref 4.0–10.5)
nRBC: 0 % (ref 0.0–0.2)

## 2020-03-21 LAB — BASIC METABOLIC PANEL
Anion gap: 7 (ref 5–15)
BUN: 15 mg/dL (ref 8–23)
CO2: 27 mmol/L (ref 22–32)
Calcium: 9.3 mg/dL (ref 8.9–10.3)
Chloride: 103 mmol/L (ref 98–111)
Creatinine, Ser: 1.02 mg/dL (ref 0.61–1.24)
GFR calc Af Amer: >60 mL/min (ref >60)
GFR calc non Af Amer: >60 mL/min (ref >60)
Glucose, Bld: 141 mg/dL — ABNORMAL HIGH (ref 70–99)
Potassium: 4.5 mmol/L (ref 3.5–5.1)
Sodium: 137 mmol/L (ref 135–145)

## 2020-03-21 MED ORDER — ENTRESTO 49-51 MG PO TABS
1.0000 | ORAL_TABLET | Freq: Two times a day (BID) | ORAL | 6 refills | Status: DC
Start: 2020-03-21 — End: 2020-04-23

## 2020-03-21 NOTE — Progress Notes (Signed)
Patient ID: [name] [name], male   DOB: [birth year], 69 y.o.   MRN: [id number] PCP: Dr. Birdie Riddle Cardiology: Dr. Aundra Dubin  69 y.o. with history of aortic insufficiency s/p Ross procedure and aortic root aneurysm presents for followup of Ross procedure.  He has a left-sided brachial plexopathy of uncertain etiology and has atrophy of his left shoulder and upper arm.  He developed weakness in his right hand as well as occasional vertigo-type spells.  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    Echo in 8/18 showed no regurgitation or stenosis of autograft in aortic position, bioprosthetic pulmonic valve with mild pulmonic stenosis, mean gradient 10 mmHg.  EF was read as 40-45%, which is lower than in the past.  MRA chest 3/19 showed 5.7 cm dilation of pulmonary autograft at level of sinuses of Valsalva, MRA chest in 6/20 showed 6 cm dilation of the pulmonary autograft at the level of the sinuses of valsalva.  He is being followed for aortic root aneurysm by Dr. Servando Snare.   MRA chest in 6/21 showed aortic root 5.4 cm, no change from prior. Echo 03/05/20 and showed EF 50%, RV mildly enlarged with mildly decreased systolic function, pulmonary valve autograft in aortic position with mild regurgitation but no stenosis, bioprosthetic pulmonary valve with no significant stenosis or regurgitation, ascending aorta 5.3 cm.   Pt recently noted to be in atrial flutter w/ RVR and set up for  TEE guided DCCV on 8/12. TEE showed EF 40%, mildly decreased RV function, aortic valve replaced by pulmonary autograft with mild AI and no AS, bioprosthetic PV looked stable.  No LA appendage thrombus, he underwent DCCV.  Had clinic f/u w/ Dr. Aundra Dubin the next day on 8/13  and was still in NSR but was volume overloaded w/ SOB and orthopnea. ReDs Clip was elevated at 39%. Lasix was increased to 40 mg daily and he was taken off telmesartan and started on Entresto.   He presents back today for f/u. Here w/ his wife.  EKG show NSR w/ 1st degree AVB and RBBB, 71 bpm. BP mildly elevated 138/86. REDS clip still mildly elevated at 38%. Wt only down 3 lb but he notes feeling much better w/ medication change. PND/ Orthopnea completely resolved. Exercise tolerance better no dyspnea w/ ADLs. Reports full med compliance. No abnormal bleeding w/ Eliquis.   REDS clip: 38%    ECG (personally reviewed): NSR, 1st degree AVB, RBBB 71 bpm   Labs (2/14): K 3.7, creatinine 0.9, LDL 63, HDL 29 Labs (3/14): BNP 36 Labs (2/15): K 3.7, creatinine 0.8, LDL 61, HDL 30 Labs (4/16): K 3.7, creatinine 0.8, LDL 51, HDL 34 Labs (5/16): HCT 44.9 Labs (11/17): LDL 74, HDL 40, K 4.1, creatinine 0.77 Labs (11/18): LDL 75, HDL 35 Labs (2/19): K 4.2, creatinine 0.88 Labs (5/20): LDL 60, K 4.7, creatinine 0.81 Labs (5/21): TSH normal, LDL 71, K 4.7, creatinine 0.78 Labs (8/21): K 4.3, creatinine 0.75  PMH: 1. Aortic insufficiency: s/p Ross procedure in 1994 in [place].   - Echo (4/11) with hypokinetic basal septum (likely post-surgical), EF 50%, mild LVH, s/p Ross procedure with native pulmonic valve in aortic position with mild aortic insufficiency and bioprosthetic pulmonic valve with no pulmonic insufficiency,  mild MR, aortic upper normal in size.  - Echo (4/13) with EF 55%, mild LVH, mild AI, mild MR, mild RV dilation, bioprosthetic pulmonic valve with peak pressure 25 mmHg.   - Echo (3/14): Mild LV dilation, mild LVH, EF 60%, pulmonary valve in aortic position (s/p Ross) with mild AI and mildly dilated ascending aorta to 4.3 cm, mildly dilated RV with normal systolic function, bioprosthetic PVR with mean gradient 23 mmHg.  Echo (3/15) with EF 55-60%, mild LVH, mild AI with no AS, bioprosthetic  pulmonic valve with peak gradient 31 mmHg, ascending aorta 4.4 cm.   - Cardiac MRI/MRA chest (5/15) with mild regurgitation of autograft aortic valve and mild regurgitation of homograft pulmonic valve, dilation of aortic root at sinuses of valsalva (4.8 cm), EF 60%, no LGE, mild RV dilation with normal RV systolic function.   - Echo (5/16) with EF 55-60%, mild autograft AI, mild MR, mildly decreased RV systolic function, biatrial enlargement, RVSP 35 mmHg, trivially increased gradient across bioprosthetic pulmonic valve (no progression).  - Echo (8/17): EF 55-60%, pulmonary autograft in aortic valve position, mild AI, bioprosthetic pulmonary valve with mild PS (peak 22 mmHg), moderate RV dilation with normal systolic function.  - Echo (8/18): EF 40-45%, mildly increased gradient across bioprosthetic pulmonary valve (mean 10 mmHg), s/p Ross procedure with no stenosis or significant regurgitation of pulmonary autograft in aortic position.  - Echo (8/21): EF 50%, RV mildly enlarged with mildly decreased systolic function, pulmonary valve autograft in aortic position with mild regurgitation but no stenosis, bioprosthetic pulmonary valve with no significant stenosis or regurgitation, ascending aorta 5.3 cm.  - TEE (8/21): EF 40%, mildly decreased RV function, aortic valve replaced by pulmonary autograft with mild AI and no AS, bioprosthetic PV looked stable. 2. Post-operative atrial fibrillation after heart surgery.  3. Normal left heart cath prior to 1994 heart surgery.  4. Left brachial plexopathy with left shoulder muscle atrophy.  Uncertain etiology.  5. BPH 6. Hyperlipidemia.  7. Cervical spinal stenosis with radiculopathy.   8. 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  9. ETT-Sestamibi (3/14): No ischemia or infarction.  10. Carotid dopplers (6/14): mild disease only.  11. Sinus of valsalva aneurysm: MRA chest 2/18 with dilation  of pulmonary autograft at level of sinuses of Valsalva to 5.4 cm when measured into left cusp.  - MRA chest (3/19): Saccular dilatation of the left and right sinuses of valsalva, 5.7 cm aortic root diameter.   - MRA chest (6/20): 6 cm dilated sinuses of valsalva, 4.6 cm ascending aorta.  - MRA chest (6/21): aortic root 5.4 cm, no change from prior 12. Elevated left hemidiaphragm.  13. Atrial flutter: Atypical.  Noted in 8/21 => underwent DCCV to NSR. Marland Kitchen   FH: Prostate CA, HTN  SH: Lives in [place], owns a window washing business, married, nonsmoker.   ROS: All systems reviewed and negative except as per HPI.    Current Outpatient Medications  Medication Sig Dispense Refill  . apixaban (ELIQUIS) 5 MG TABS tablet Take 1 tablet (5 mg total) by mouth 2 (two) times daily. 60 tablet 11  . cetirizine (ZYRTEC) 10 MG tablet Take 10 mg by mouth daily.    . finasteride (PROSCAR) 5 MG tablet TAKE 1 TABLET(5 MG) BY MOUTH DAILY 90 tablet 1  . fluticasone (FLONASE) 50 MCG/ACT nasal spray Place 2 sprays into both nostrils as needed for allergies or rhinitis. SHAKE LIQUID AND USE  2 SPRAYS DAILY AS NEEDED    . furosemide (LASIX) 20 MG tablet Take 2 tablets (40 mg total) by mouth daily. 30 tablet 6  . hydrocortisone (ANUSOL-HC) 25 MG suppository Place 1 suppository (25 mg total) rectally 2 (two) times daily. 24 suppository 0  . metoprolol succinate (TOPROL-XL) 100 MG 24 hr tablet Take 1 tablet (100 mg total) by mouth 2 (two) times daily. 180 tablet 3  . omeprazole (PRILOSEC) 40 MG capsule Take 1 capsule (40 mg total) by mouth daily. 30 capsule 3  . potassium chloride (KLOR-CON) 20 MEQ tablet Take 1 tablet (20 mEq total) by mouth daily. 30 tablet 6  . sacubitril-valsartan (ENTRESTO) 24-26 MG Take 1 tablet by mouth 2 (two) times daily. 60 tablet 3  . simvastatin (ZOCOR) 20 MG tablet Take 20 mg by mouth daily.    . Wheat Dextrin (BENEFIBER) POWD Take 2 Scoops by mouth daily.     No current  facility-administered medications for this encounter.   Vitals  BP 138/86   Pulse 75   Wt 108.2 kg (238 lb 9.6 oz)   SpO2 98%   BMI 29.82 kg/m    PHYSICAL EXAM: General:  Well appearing. No respiratory difficulty HEENT: normal Neck: supple. no JVD. Carotids 2+ bilat; no bruits. No lymphadenopathy or thyromegaly appreciated. Cor: PMI nondisplaced. Regular rate & rhythm. No rubs, gallops or murmurs. Lungs: clear Abdomen: soft, nontender, nondistended. No hepatosplenomegaly. No bruits or masses. Good bowel sounds. Extremities: no cyanosis, clubbing, rash, edema Neuro: alert & oriented x 3, cranial nerves grossly intact. moves all 4 extremities w/o difficulty. Affect pleasant.   Assessment/Plan: 1. Atrial flutter:  Likely related to prior cardiac surgery.  He had peri-operative AF at time of heart surgery. Noted to be in atrial flutter 8/21. Undewent DCCV on 03/14/20. Remains in NSR currently. HR controlled w/  blocker.  - Continue Eliquis 5 mg bid. Check CBC today  - Continue Toprol XL 100 mg bid.  - ECG reviewed by Dr. Rayann Heman and he would not likely be straightforward ablation. He has been referred to Dr. Rayann Heman for formal evaluation. Consult scheduled for 8/23  2.  History of Ross Procedure: Echo and TEE in 8/21 showed pulmonary autograft in aortic position with mild AI, no AS.  The bioprosthetic pulmonary valve was not significantly abnormal.  - Needs endocarditis prophylaxis with dental work.   3.  Aortic root aneurym: This not uncommonly accompanies Ross procedure.  The pulmonary autograft at sinuses of valsalva measured 5.4 cm by MRA chest in 6/21.  This was reviewed by Dr. Servando Snare and thought to have been stable over the last few years, continued observation.   - Per Dr. Servando Snare, would hold off on elective repair unless there is significant AI.   - Continue blood pressure control, goal SBP 120s or lower.  - Repeat MRA chest in 6/22.  4. HTN: Given aneurysm, need to make sure  that BP is well-controlled. His BP is elevated today at 138/86 and he remains mildly volume overloaded based on ReDs Clip, 38%. Will further increase Entresto to 49-51 mg bid - f/u w/ pharmD in 2 weeks for repeat BP check and further med titration if needed  5. Hyperlipidemia: Lipids acceptable when recently checked. LDL 70 mg/dL. TG 104 6. Chronic systolic CHF: TEE in setting of long-standing atrial flutter with RVR showed EF down to 40%. Possible tachy-mediated CM. Maintaining NSR after recent DCCV. Symptoms improved w/ recent med changes/ addition of Entresto, currently NYHA Class II w/  complete resolution of orthopnea/PND, however remains mildly fluid overloaded based on ReDs clip, 38% today.  - Increase Entresto to 49-51 mg bid today  - Continue Lasix 40 mg daily  - Check BMP today  - Repeat echo in 4 weeks, now that he is in NSR to see if EF improves. - Consider addition of SGLT2i next if EF remains low    F/u w/ PharmD in 2 weeks   Lyda Jester, PA-C  03/21/2020

## 2020-03-21 NOTE — Patient Instructions (Signed)
INCREASE Entresto to 49/51 mg, one tab twice a day  Labs today We will only contact you if something comes back abnormal or we need to make some changes. Otherwise no news is good news!  Your physician recommends that you schedule a follow-up appointment in: 4 weeks in the pharmacy clinic  Do the following things EVERYDAY: 1) Weigh yourself in the morning before breakfast. Write it down and keep it in a log. 2) Take your medicines as prescribed 3) Eat low salt foods--Limit salt (sodium) to 2000 mg per day.  4) Stay as active as you can everyday 5) Limit all fluids for the day to less than 2 liters   If you have any questions or concerns before your next appointment please send [place] a message through [place] or call our office at [phone].    TO LEAVE A MESSAGE FOR THE NURSE SELECT OPTION 2, PLEASE LEAVE A MESSAGE INCLUDING: . YOUR NAME . DATE OF BIRTH . CALL BACK NUMBER . REASON FOR CALL**this is important as we prioritize the call backs  YOU WILL RECEIVE A CALL BACK THE SAME DAY AS LONG AS YOU CALL BEFORE 4:00 PM

## 2020-03-22 ENCOUNTER — Telehealth: Payer: Self-pay

## 2020-03-22 NOTE — Telephone Encounter (Signed)
Left a message regarding virtual appt on 03/25/20.

## 2020-03-25 ENCOUNTER — Encounter: Payer: Self-pay | Admitting: Internal Medicine

## 2020-03-25 ENCOUNTER — Other Ambulatory Visit: Payer: Self-pay

## 2020-03-25 ENCOUNTER — Telehealth (INDEPENDENT_AMBULATORY_CARE_PROVIDER_SITE_OTHER): Payer: Medicare Other | Admitting: Internal Medicine

## 2020-03-25 DIAGNOSIS — D6869 Other thrombophilia: Secondary | ICD-10-CM

## 2020-03-25 DIAGNOSIS — I5022 Chronic systolic (congestive) heart failure: Secondary | ICD-10-CM | POA: Diagnosis not present

## 2020-03-25 DIAGNOSIS — Z954 Presence of other heart-valve replacement: Secondary | ICD-10-CM

## 2020-03-25 DIAGNOSIS — I484 Atypical atrial flutter: Secondary | ICD-10-CM

## 2020-03-25 NOTE — Progress Notes (Signed)
Electrophysiology TeleHealth Note   Due to national recommendations of social distancing due to [place] 19, Audio/video telehealth visit is felt to be most appropriate for this patient at this time.  See MyChart message from today for patient consent regarding telehealth for [facility].   Date:  03/25/2020   ID:  [name] [name], DOB [birth year], MRN [id number]  Location: home Provider location: [place] Evaluation Performed: New patient consult  PCP:  [name] Minium, MD  Cardiologist:  Dr [name] [name] Electrophysiologist:  None   Chief Complaint:  Atrial arrhythmias  History of Present Illness:    [name] [name] is a 69 y.o. male who presents via audio/video conferencing for a telehealth visit today.   The patient is referred for new consultation regarding atrial flutter by Dr [name] [name]. The patient reports undergoing ROSS procedure in 1994 in [place]. He has a bioprosthetic pulmonary valve with pulmonary autograft in aortic position. He did have some early atrial arrhythmias but has done very well since that time.  He recently presented with symptoms of CHF including SOB and orthpnea to the CHF clinic.  He was noted to be in atrial flutter.  In retrospect, he thinks that he was in this rhythm since May.  He was placed on eliquis and toprol was increased.  He underwent TEE guided [facility] 03/14/20.  Since cardioversion, he has done very well.    Today, he denies symptoms of palpitations, chest pain, shortness of breath, orthopnea, PND, lower extremity edema, claudication, dizziness, presyncope, syncope, bleeding, or neurologic sequela. The patient is tolerating medications without difficulties and is otherwise without complaint today.     Past Medical History:  Diagnosis Date  . Acute gastritis   . Aortic insufficiency    s/p aortic valve replacement in 1994  . Early satiety   . Esophageal stricture   . GERD (gastroesophageal reflux disease)   . Headache syndrome 10/18/2018    . Hiatal hernia   . Hyperlipidemia   . Neuropathy    left brachial plexus  . Pain, dental     Past Surgical History:  Procedure Laterality Date  . AORTIC VALVE REPLACEMENT  1994  . CARDIOVERSION N/A 03/14/2020   Procedure: CARDIOVERSION;  Surgeon: [name] Dresser, MD;  Location: [facility] ENDOSCOPY;  Service: Cardiovascular;  Laterality: N/A;  . GANGLION CYST EXCISION  JAN 2002  . HIATAL HERNIA REPAIR  DEC 2002  . TEE WITHOUT CARDIOVERSION N/A 03/14/2020   Procedure: TRANSESOPHAGEAL ECHOCARDIOGRAM (TEE);  Surgeon: [name] Dresser, MD;  Location: [facility] ENDOSCOPY;  Service: Cardiovascular;  Laterality: N/A;  . TONSILLECTOMY    . VASECTOMY  DEC 2002    Current Outpatient Medications  Medication Sig Dispense Refill  . apixaban (ELIQUIS) 5 MG TABS tablet Take 1 tablet (5 mg total) by mouth 2 (two) times daily. 60 tablet 11  . cetirizine (ZYRTEC) 10 MG tablet Take 10 mg by mouth daily.    . finasteride (PROSCAR) 5 MG tablet TAKE 1 TABLET(5 MG) BY MOUTH DAILY 90 tablet 1  . fluticasone (FLONASE) 50 MCG/ACT nasal spray Place 2 sprays into both nostrils as needed for allergies or rhinitis. SHAKE LIQUID AND USE 2 SPRAYS DAILY AS NEEDED    . furosemide (LASIX) 20 MG tablet Take 2 tablets (40 mg total) by mouth daily. 30 tablet 6  . hydrocortisone (ANUSOL-HC) 25 MG suppository Place 1 suppository (25 mg total) rectally 2 (two) times daily. 24 suppository 0  . metoprolol succinate (TOPROL-XL) 100 MG 24 hr tablet  Take 1 tablet (100 mg total) by mouth 2 (two) times daily. 180 tablet 3  . omeprazole (PRILOSEC) 40 MG capsule Take 1 capsule (40 mg total) by mouth daily. 30 capsule 3  . potassium chloride (KLOR-CON) 20 MEQ tablet Take 1 tablet (20 mEq total) by mouth daily. 30 tablet 6  . sacubitril-valsartan (ENTRESTO) 49-51 MG Take 1 tablet by mouth 2 (two) times daily. 60 tablet 6  . simvastatin (ZOCOR) 20 MG tablet Take 20 mg by mouth daily.    . Wheat Dextrin (BENEFIBER) POWD Take 2 Scoops by mouth daily.      No current facility-administered medications for this visit.    Allergies:   Penicillins, Quinidine, and Quinolones   Social History:  The patient  reports that he has never smoked. He has never used smokeless tobacco. He reports that he does not drink alcohol and does not use drugs.   Family History:  The patient's family history includes Hypertension in an other family member; Leukemia in his brother; Lung cancer in his father; Prostate cancer in an other family member.    ROS:  Please see the history of present illness.   All other systems are personally reviewed and negative.    Exam:    Vital Signs:  There were no vitals taken for this visit.  Well appearing, alert and conversant, regular work of breathing,  good skin color Eyes- anicteric, neuro- grossly intact, skin- no apparent rash or lesions or cyanosis, mouth- oral mucosa is pink   Labs/Other Tests and Data Reviewed:    Recent Labs: 12/14/2019: ALT 27; TSH 1.61 03/05/2020: B Natriuretic Peptide 230.7 03/21/2020: BUN 15; Creatinine, Ser 1.02; Hemoglobin 15.3; Platelets 252; Potassium 4.5; Sodium 137   Wt Readings from Last 3 Encounters:  03/21/20 238 lb 9.6 oz (108.2 kg)  03/15/20 241 lb (109.3 kg)  03/14/20 235 lb (106.6 kg)     Other studies personally reviewed: Additional studies/ records that were reviewed today include: Dr [name] [name] notes, recent echo, TEE, ekgs  Review of the above records today demonstrates: as above  ekg 03/05/20 shows atypical atrial flutter with RBBB ekg 03/14/20 shows sinus with first degree AV block and RBBB   ASSESSMENT & PLAN:    1.  Atypical atrial flutter The patient had recent atrial flutter.  This is his first event.  He is s/p prior ROSS procedure.  He also has chronic baseline conduction system disease by ekg. I do not think that ablation would be straight forward.  I also worry about risks of AADs given his conduction system disease. For now, I would advise that we continue  "watchful waiting".  If no arrhythmia in the next few weeks, reduce toprol 100mg  daily chads2vasc score is 2 which considering EF reduction.  If his EF normalizes with sinus, perhaps his CHADS2VASC score would then be 1 and we could consider stopping [place] long term.  If he has further arrhythmias in the near future, I would favor ablation over AAD therapy and we could have additional conversations at that time.   Follow-up:  He will follow closely with Dr [name] [name] I will see as needed  Patient Risk:  after full review of this patients clinical status, I feel that they are at moderate risk at this time.   Today, I have spent 20 minutes with the patient with telehealth technology discussing atrial flutter .    SignedThompson Grayer MD, [place] 03/25/2020 9:52 AM   [place]  [address] [place] [address] [phone] (office) [phone] (fax)

## 2020-03-28 ENCOUNTER — Ambulatory Visit (HOSPITAL_COMMUNITY)
Admission: RE | Admit: 2020-03-28 | Discharge: 2020-03-28 | Disposition: A | Discharge status: Home / Self Care | Payer: Medicare Other | Source: Ambulatory Visit | Attending: Cardiology | Admitting: Cardiology

## 2020-03-28 ENCOUNTER — Other Ambulatory Visit: Payer: Self-pay

## 2020-03-28 VITALS — BP 110/70 | HR 66 | Wt 237.2 lb

## 2020-03-28 DIAGNOSIS — E785 Hyperlipidemia, unspecified: Secondary | ICD-10-CM | POA: Diagnosis not present

## 2020-03-28 DIAGNOSIS — I110 Hypertensive heart disease with heart failure: Secondary | ICD-10-CM | POA: Diagnosis not present

## 2020-03-28 DIAGNOSIS — I4892 Unspecified atrial flutter: Secondary | ICD-10-CM | POA: Diagnosis not present

## 2020-03-28 DIAGNOSIS — I484 Atypical atrial flutter: Secondary | ICD-10-CM | POA: Diagnosis not present

## 2020-03-28 DIAGNOSIS — Z79899 Other long term (current) drug therapy: Secondary | ICD-10-CM | POA: Insufficient documentation

## 2020-03-28 DIAGNOSIS — I4891 Unspecified atrial fibrillation: Secondary | ICD-10-CM | POA: Insufficient documentation

## 2020-03-28 DIAGNOSIS — Z7901 Long term (current) use of anticoagulants: Secondary | ICD-10-CM | POA: Insufficient documentation

## 2020-03-28 DIAGNOSIS — I509 Heart failure, unspecified: Secondary | ICD-10-CM

## 2020-03-28 DIAGNOSIS — I5022 Chronic systolic (congestive) heart failure: Secondary | ICD-10-CM | POA: Insufficient documentation

## 2020-03-28 LAB — BASIC METABOLIC PANEL
Anion gap: 10 (ref 5–15)
BUN: 22 mg/dL (ref 8–23)
CO2: 21 mmol/L — ABNORMAL LOW (ref 22–32)
Calcium: 9.1 mg/dL (ref 8.9–10.3)
Chloride: 105 mmol/L (ref 98–111)
Creatinine, Ser: 0.94 mg/dL (ref 0.61–1.24)
GFR calc Af Amer: >60 mL/min (ref >60)
GFR calc non Af Amer: >60 mL/min (ref >60)
Glucose, Bld: 232 mg/dL — ABNORMAL HIGH (ref 70–99)
Potassium: 4.3 mmol/L (ref 3.5–5.1)
Sodium: 136 mmol/L (ref 135–145)

## 2020-03-28 MED ORDER — SPIRONOLACTONE 25 MG PO TABS
12.5000 mg | ORAL_TABLET | Freq: Every day | ORAL | 6 refills | Status: DC
Start: 2020-03-28 — End: 2020-09-30

## 2020-03-28 NOTE — Progress Notes (Signed)
Patient ID: PRATT BRESS, male   DOB: 1951-01-11, 69 y.o.   MRN: [id number] PCP: Dr. Birdie Riddle Cardiology: Dr. Aundra Dubin  69 y.o. with history of aortic insufficiency s/p Ross procedure and aortic root aneurysm presents for followup of Ross procedure.  He has a left-sided brachial plexopathy of uncertain etiology and has atrophy of his left shoulder and upper arm.  He developed weakness in his right hand as well as occasional vertigo-type spells.  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    Echo in 8/18 showed no regurgitation or stenosis of autograft in aortic position, bioprosthetic pulmonic valve with mild pulmonic stenosis, mean gradient 10 mmHg.  EF was read as 40-45%, which is lower than in the past.  MRA chest 3/19 showed 5.7 cm dilation of pulmonary autograft at level of sinuses of Valsalva, MRA chest in 6/20 showed 6 cm dilation of the pulmonary autograft at the level of the sinuses of valsalva.  He is being followed for aortic root aneurysm by Dr. Servando Snare.   MRA chest in 6/21 showed aortic root 5.4 cm, no change from prior. Echo was done today and reviewed, EF 50%, RV mildly enlarged with mildly decreased systolic function, pulmonary valve autograft in aortic position with mild regurgitation but no stenosis, bioprosthetic pulmonary valve with no significant stenosis or regurgitation, ascending aorta 5.3 cm.   Patient is noted to be in atrial flutter in 8/21, rate 110s.  Looking back, his HR was elevated in the 110s at physician appointments back to 5/21 (no ECGs).  I took him for TEE-guided DCCV in 8/21.  TEE showed EF 40%, mildly decreased RV function, aortic valve replaced by pulmonary autograft with mild AI and no AS,  bioprosthetic PV looked stable.  No LA appendage thrombus, he underwent DCCV.  After DCCV, he developed exertional dyspnea and was started on Lasix.   He remains in NSR today.  Weight down 1 lb.  Breathing better, minimal exertional dyspnea.  He is working again washing windows.  No orthopnea/PND.  No chest pain.  No lightheadedness.     REDS clip 36%    ECG (personally reviewed): NSR, 1st degree AVB, PACs, RBBB  Labs (2/14): K 3.7, creatinine 0.9, LDL 63, HDL 29 Labs (3/14): BNP 36 Labs (2/15): K 3.7, creatinine 0.8, LDL 61, HDL 30 Labs (4/16): K 3.7, creatinine 0.8, LDL 51, HDL 34 Labs (5/16): HCT 44.9 Labs (11/17): LDL 74, HDL 40, K 4.1, creatinine 0.77 Labs (11/18): LDL 75, HDL 35 Labs (2/19): K 4.2, creatinine 0.88 Labs (5/20): LDL 60, K 4.7, creatinine 0.81 Labs (5/21): TSH normal, LDL 71, K 4.7, creatinine 0.78 Labs (8/21): K 4.3, creatinine 0.75 => 1.02  PMH: 1. Aortic insufficiency: s/p Ross procedure in 1994 in [place].   - Echo (4/11) with hypokinetic basal septum (likely post-surgical), EF 50%, mild LVH, s/p Ross procedure with native pulmonic valve in aortic position with mild aortic insufficiency and bioprosthetic pulmonic valve with no pulmonic insufficiency, mild MR, aortic upper normal in size.  - Echo (4/13) with EF 55%, mild LVH, mild AI, mild MR, mild RV dilation, bioprosthetic pulmonic valve with peak pressure 25 mmHg.   - Echo (3/14): Mild LV dilation, mild LVH, EF 60%, pulmonary valve in  aortic position (s/p Ross) with mild AI and mildly dilated ascending aorta to 4.3 cm, mildly dilated RV with normal systolic function, bioprosthetic PVR with mean gradient 23 mmHg.  Echo (3/15) with EF 55-60%, mild LVH, mild AI with no AS, bioprosthetic pulmonic valve with peak gradient 31 mmHg, ascending aorta 4.4 cm.   - Cardiac MRI/MRA chest (5/15) with mild regurgitation of autograft aortic valve and mild regurgitation of homograft pulmonic valve, dilation of aortic root at  sinuses of valsalva (4.8 cm), EF 60%, no LGE, mild RV dilation with normal RV systolic function.   - Echo (5/16) with EF 55-60%, mild autograft AI, mild MR, mildly decreased RV systolic function, biatrial enlargement, RVSP 35 mmHg, trivially increased gradient across bioprosthetic pulmonic valve (no progression).  - Echo (8/17): EF 55-60%, pulmonary autograft in aortic valve position, mild AI, bioprosthetic pulmonary valve with mild PS (peak 22 mmHg), moderate RV dilation with normal systolic function.  - Echo (8/18): EF 40-45%, mildly increased gradient across bioprosthetic pulmonary valve (mean 10 mmHg), s/p Ross procedure with no stenosis or significant regurgitation of pulmonary autograft in aortic position.  - Echo (8/21): EF 50%, RV mildly enlarged with mildly decreased systolic function, pulmonary valve autograft in aortic position with mild regurgitation but no stenosis, bioprosthetic pulmonary valve with no significant stenosis or regurgitation, ascending aorta 5.3 cm.  - TEE (8/21): EF 40%, mildly decreased RV function, aortic valve replaced by pulmonary autograft with mild AI and no AS, bioprosthetic PV looked stable. 2. Post-operative atrial fibrillation after heart surgery.  3. Normal left heart cath prior to 1994 heart surgery.  4. Left brachial plexopathy with left shoulder muscle atrophy.  Uncertain etiology.  5. BPH 6. Hyperlipidemia.  7. Cervical spinal stenosis with radiculopathy.   8. 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  9. ETT-Sestamibi (3/14): No ischemia or infarction.  10. Carotid dopplers (6/14): mild disease only.  11. Sinus of valsalva aneurysm: MRA chest 2/18 with dilation of pulmonary autograft at level of sinuses of Valsalva to 5.4 cm when measured into left cusp.  - MRA chest (3/19): Saccular dilatation of the left and right sinuses of valsalva, 5.7 cm aortic root diameter.   - MRA chest (6/20):  6 cm dilated sinuses of valsalva, 4.6 cm ascending aorta.  - MRA chest (6/21): aortic root 5.4 cm, no change from prior 12. Elevated left hemidiaphragm.  13. Atrial flutter: Atypical.  Noted in 8/21 => underwent DCCV to NSR. Marland Kitchen   FH: Prostate CA, HTN  SH: Lives in [place], owns a window washing business, married, nonsmoker.   ROS: All systems reviewed and negative except as per HPI.    Current Outpatient Medications  Medication Sig Dispense Refill  . apixaban (ELIQUIS) 5 MG TABS tablet Take 1 tablet (5 mg total) by mouth 2 (two) times daily. 60 tablet 11  . cetirizine (ZYRTEC) 10 MG tablet Take 10 mg by mouth daily.    . finasteride (PROSCAR) 5 MG tablet TAKE 1 TABLET(5 MG) BY MOUTH DAILY 90 tablet 1  . fluticasone (FLONASE) 50 MCG/ACT nasal spray Place 2 sprays into both nostrils as needed for allergies or rhinitis. SHAKE LIQUID AND USE 2 SPRAYS DAILY AS NEEDED    . furosemide (LASIX) 20 MG tablet Take 2 tablets (40 mg total) by mouth daily. 30 tablet 6  . hydrocortisone (ANUSOL-HC) 25 MG suppository Place 1 suppository (25 mg total) rectally 2 (two) times daily. 24 suppository  0  . metoprolol succinate (TOPROL-XL) 100 MG 24 hr tablet Take 1 tablet (100 mg total) by mouth 2 (two) times daily. 180 tablet 3  . omeprazole (PRILOSEC) 40 MG capsule Take 1 capsule (40 mg total) by mouth daily. 30 capsule 3  . sacubitril-valsartan (ENTRESTO) 49-51 MG Take 1 tablet by mouth 2 (two) times daily. 60 tablet 6  . simvastatin (ZOCOR) 20 MG tablet Take 20 mg by mouth daily.    . Wheat Dextrin (BENEFIBER) POWD Take 2 Scoops by mouth daily.    Marland Kitchen spironolactone (ALDACTONE) 25 MG tablet Take 0.5 tablets (12.5 mg total) by mouth daily. 15 tablet 6   No current facility-administered medications for this encounter.    BP 110/70   Pulse 66   Wt 107.6 kg (237 lb 3.2 oz)   SpO2 96%   BMI 29.65 kg/m  General: NAD Neck: No JVD, no thyromegaly or thyroid nodule.  Lungs: Clear to auscultation bilaterally  with normal respiratory effort. CV: Nondisplaced PMI.  Heart regular S1/S2, no S3/S4, 1/6 SEM RUSB.  No peripheral edema.  No carotid bruit.  Normal pedal pulses.  Abdomen: Soft, nontender, no hepatosplenomegaly, no distention.  Skin: Intact without lesions or rashes.  Neurologic: Alert and oriented x 3.  Psych: Normal affect. Extremities: No clubbing or cyanosis.  HEENT: Normal.   Assessment/Plan: 1. Atrial flutter:  Likely related to prior cardiac surgery.  He had peri-operative AF at time of heart surgery but had not been noted to have recurrent arrhythmias until 8/21.  In 8/21, he was noted to be in atrial flutter with RVR, based on review of HR over the past few months, he had probably been in atrial flutter since at least 5/21. He had DCCV in 8/21 and is in NSR today. He saw Dr. Rayann Heman, ablation offered if flutter recurs.  - Continue Eliquis 5 mg bid.  - Continue Toprol XL 100 mg bid.  2.  History of Ross Procedure: Echo and TEE in 8/21 showed pulmonary autograft in aortic position with mild AI, no AS.  The bioprosthetic pulmonary valve was not significantly abnormal.  - Needs endocarditis prophylaxis with dental work.   3.  Aortic root aneurym: This not uncommonly accompanies Ross procedure.  The pulmonary autograft at sinuses of valsalva measured 5.4 cm by MRA chest in 6/21.  This was reviewed by Dr. Servando Snare and thought to have been stable over the last few years, continued observation.   - Per Dr. Servando Snare, would hold off on elective repair unless there is significant AI.   - Continue blood pressure control, goal SBP 120s or lower.  - Repeat MRA chest in 6/22.  4. HTN: Given aneurysm, need to make sure that BP is well-controlled. It is well-controlled today.  5. Hyperlipidemia: Lipids acceptable when recently checked.  6. Chronic systolic CHF: TEE in setting of long-standing atrial flutter with RVR showed EF down to 40%. 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  He was cardioverted in 8/21 but  developed exertional dyspnea with volume overload afterwards.  Today, REDS clip improved at 36%, and he does not look volume overloaded on exam.    - Continue Lasix 40 mg daily.  - Continue Toprol XL.  - Continue Entresto 49/51 bid.  - Add spironolactone 12.5 daily and stop KCl.  - Followup in 1 month with echo to see if EF improves in NSR.   Followup 1 month with echo.    Loralie Champagne 03/28/2020

## 2020-03-28 NOTE — Patient Instructions (Signed)
Stop Potassium  Start Spironolactone 12.5 mg (1/2 tab) Daily  Labs done today, your results will be available in MyChart, we will contact you for abnormal readings.  Your physician recommends that you return for lab work in: 10 days  Your physician recommends that you schedule a follow-up appointment in: 4-6 weeks with an echocardiogram  If you have any questions or concerns before your next appointment please send [place] a message through [place] or call our office at [phone].    TO LEAVE A MESSAGE FOR THE NURSE SELECT OPTION 2, PLEASE LEAVE A MESSAGE INCLUDING: . YOUR NAME . DATE OF BIRTH . CALL BACK NUMBER . REASON FOR CALL**this is important as we prioritize the call backs  [place] AS LONG AS YOU CALL BEFORE 4:00 PM  At the [facility], you and your health needs are our priority. As part of our continuing mission to provide you with exceptional heart care, we have created designated Provider Care Teams. These Care Teams include your primary Cardiologist (physician) and Advanced Practice Providers (APPs- Physician Assistants and Nurse Practitioners) who all work together to provide you with the care you need, when you need it.   You may see any of the following providers on your designated Care Team at your next follow up: Marland Kitchen Dr Glori Bickers . Dr Loralie Champagne . Darrick Grinder, NP . Lyda Jester, PA . Audry Riles, PharmD   Please be sure to bring in all your medications bottles to every appointment.

## 2020-04-09 ENCOUNTER — Other Ambulatory Visit: Payer: Self-pay

## 2020-04-09 ENCOUNTER — Ambulatory Visit (HOSPITAL_COMMUNITY)
Admission: RE | Admit: 2020-04-09 | Discharge: 2020-04-09 | Disposition: A | Discharge status: Home / Self Care | Payer: Medicare Other | Source: Ambulatory Visit | Attending: Internal Medicine | Admitting: Internal Medicine

## 2020-04-09 DIAGNOSIS — I10 Essential (primary) hypertension: Secondary | ICD-10-CM | POA: Diagnosis present

## 2020-04-09 LAB — BASIC METABOLIC PANEL
Anion gap: 10 (ref 5–15)
BUN: 16 mg/dL (ref 8–23)
CO2: 25 mmol/L (ref 22–32)
Calcium: 9.3 mg/dL (ref 8.9–10.3)
Chloride: 101 mmol/L (ref 98–111)
Creatinine, Ser: 0.88 mg/dL (ref 0.61–1.24)
GFR calc Af Amer: >60 mL/min (ref >60)
GFR calc non Af Amer: >60 mL/min (ref >60)
Glucose, Bld: 155 mg/dL — ABNORMAL HIGH (ref 70–99)
Potassium: 4 mmol/L (ref 3.5–5.1)
Sodium: 136 mmol/L (ref 135–145)

## 2020-04-10 ENCOUNTER — Telehealth: Payer: Self-pay | Admitting: Family Medicine

## 2020-04-10 ENCOUNTER — Other Ambulatory Visit: Payer: Self-pay | Admitting: Family Medicine

## 2020-04-10 NOTE — Telephone Encounter (Signed)
Patient needs a refill on his Prilosec refilled - Please send to [facility] on [address] in [place].  Last visit - 12/14/2019 - Next visit:  06-17-2020

## 2020-04-10 NOTE — Telephone Encounter (Signed)
Medication filled to pharmacy as requested.   

## 2020-04-10 NOTE — Progress Notes (Signed)
PCP: Dr. Birdie Riddle Cardiology: Dr. Aundra Dubin  HPI:  69 y.o. with history of aortic insufficiency s/p Ross procedure and aortic root aneurysm presents for followup of Ross procedure.  He has a left-sided brachial plexopathy of uncertain etiology and has atrophy of his left shoulder and upper arm.  He developed weakness in his right hand as well as occasional vertigo-type spells.  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    Echo in 8/18 showed no regurgitation or stenosis of autograft in aortic position, bioprosthetic pulmonic valve with mild pulmonic stenosis, mean gradient 10 mmHg.  EF was read as 40-45%, which is lower than in the past.  MRA chest 3/19 showed 5.7 cm dilation of pulmonary autograft at level of sinuses of Valsalva, MRA chest in 6/20 showed 6 cm dilation of the pulmonary autograft at the level of the sinuses of valsalva.  He is being followed for aortic root aneurysm by Dr. Servando Snare.   MRA chest in 6/21 showed aortic root 5.4 cm, no change from prior. Echo was done today and reviewed, EF 50%, RV mildly enlarged with mildly decreased systolic function, pulmonary valve autograft in aortic position with mild regurgitation but no stenosis, bioprosthetic pulmonary valve with no significant stenosis or regurgitation, ascending aorta 5.3 cm.   Patient was noted to be in atrial flutter in 8/21, rate 110s.  Looking back, his HR was elevated in the 110s at physician appointments back to 5/21 (no ECGs). Received TEE-guided DCCV in 8/21.  TEE showed EF 40%, mildly decreased RV function, aortic valve replaced by pulmonary autograft with mild AI and no AS, bioprosthetic PV looked stable.  No LA appendage thrombus, he underwent DCCV.  After  DCCV, he developed exertional dyspnea and was started on Lasix.   Recently presented to HF Clinic on 03/28/20 with Dr. Aundra Dubin. He remained in NSR.  Weight was down 1 lb.  Breathing better, minimal exertional dyspnea.  He was working again washing windows.  No orthopnea/PND.  No chest pain.  No lightheadedness.    Today he returns to HF clinic for pharmacist medication titration. At last visit with MD, spironolactone 12.5 mg daily was initiated and KCL was discontinued. Overall feeling well today. Noted he has less energy than he used to. No dizziness, lightheadedness, chest pain or palpitations. No SOB/DOE. Notes he is breathing a lot better and not limited by his stairs at home anymore. He does not weigh himself daily at home, but his clinic weights have been stable. Takes furosemide 40 mg daily and has not needed any extra. No LEE, PND or orthopnea. His appetite is good and he follows a low salt diet. Taking all medications as prescribed and tolerating all medications.   HF Medications: Metoprolol succinate 100 mg BID Entresto 49/51 mg BID Spironolactone 12.5 mg daily Furosemide 40 mg daily  Has the patient been experiencing any side effects to the medications prescribed?  no  Does the patient have any problems obtaining medications due to transportation or finances?   No - has [employer] Medicare Part D  Understanding of regimen: good Understanding of indications: good Potential of compliance: good Patient understands to avoid NSAIDs. Patient understands to avoid decongestants.    Pertinent Lab Values: . Serum creatinine 0.88, BUN  16, Potassium 4.0, Sodium 136  Vital Signs: . Weight: 237.6 lbs (last clinic weight: 237.2 lbs) . Blood pressure: 128/82  . Heart rate: 69   Assessment: 1. Atrial flutter:  Likely related to prior cardiac surgery.  He had peri-operative AF at time of heart surgery but had not been noted to have recurrent arrhythmias until 8/21.  In 8/21, he was noted to be in  atrial flutter with RVR, based on review of HR over the past few months, he had probably been in atrial flutter since at least 5/21. He had DCCV in 8/21. He saw Dr. Rayann Heman, ablation offered if flutter recurs.  - Continue Eliquis 5 mg bid.  - Continue metoprolol XL 100 mg BID.  2.  History of Ross Procedure: Echo and TEE in 8/21 showed pulmonary autograft in aortic position with mild AI, no AS.  The bioprosthetic pulmonary valve was not significantly abnormal.  - Needs endocarditis prophylaxis with dental work.   3.  Aortic root aneurym: This not uncommonly accompanies Ross procedure.  The pulmonary autograft at sinuses of valsalva measured 5.4 cm by MRA chest in 6/21.  This was reviewed by Dr. Servando Snare and thought to have been stable over the last few years, continued observation.   - Per Dr. Servando Snare, would hold off on elective repair unless there is significant AI.   - Continue blood pressure control, goal SBP 120s or lower.  - Repeat MRA chest in 6/22.  4. HTN: Given aneurysm, need to make sure that BP is well-controlled.  -Increase Entresto  5. Hyperlipidemia: Lipids acceptable when recently checked.  6. Chronic systolic CHF: TEE in setting of long-standing atrial flutter with RVR showed EF down to 40%. 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  He was cardioverted in 8/21 but developed exertional dyspnea with volume overload afterwards.   - NYHA class II symptoms, euvolemic on exam - Continue furosemide 40 mg daily.  - Continue metoprolol XL 100 mg BID.  - Increase Entresto to 97/103 mg BID. Repeat BMET in 2 weeks. - Continue spironolactone 12.5 daily - Followup in 2 weeks with echo to see if EF improves in NSR.    Plan: 1) Medication changes: Based on clinical presentation, vital signs and recent labs will increase Entresto to 97/103 mg BID.  2) Follow-up: 2 weeks with Dr. Rush Farmer, PharmD, BCPS, [facility], [facility] Pharmacist [phone]

## 2020-04-18 ENCOUNTER — Other Ambulatory Visit (HOSPITAL_COMMUNITY): Payer: Self-pay | Admitting: *Deleted

## 2020-04-18 MED ORDER — FUROSEMIDE 20 MG PO TABS: 40.0000 mg | ORAL_TABLET | Freq: Every day | ORAL | 6 refills | Status: DC

## 2020-04-23 ENCOUNTER — Ambulatory Visit (HOSPITAL_COMMUNITY)
Admission: RE | Admit: 2020-04-23 | Discharge: 2020-04-23 | Disposition: A | Discharge status: Home / Self Care | Payer: Medicare Other | Source: Ambulatory Visit | Attending: Cardiology | Admitting: Cardiology

## 2020-04-23 ENCOUNTER — Other Ambulatory Visit: Payer: Self-pay

## 2020-04-23 VITALS — BP 128/82 | HR 69 | Wt 237.6 lb

## 2020-04-23 DIAGNOSIS — I4892 Unspecified atrial flutter: Secondary | ICD-10-CM | POA: Diagnosis present

## 2020-04-23 DIAGNOSIS — I5022 Chronic systolic (congestive) heart failure: Secondary | ICD-10-CM | POA: Insufficient documentation

## 2020-04-23 DIAGNOSIS — I110 Hypertensive heart disease with heart failure: Secondary | ICD-10-CM | POA: Diagnosis not present

## 2020-04-23 DIAGNOSIS — E785 Hyperlipidemia, unspecified: Secondary | ICD-10-CM | POA: Insufficient documentation

## 2020-04-23 MED ORDER — SACUBITRIL-VALSARTAN 97-103 MG PO TABS: 1.0000 | ORAL_TABLET | Freq: Two times a day (BID) | ORAL | 3 refills | Status: DC

## 2020-04-23 NOTE — Patient Instructions (Signed)
It was a pleasure seeing you today!  MEDICATIONS: -We are changing your medications today -Increase Entresto to 97/103 mg (1 tablet) twice daily. You may take 2 tablets of the 49/51 mg strength twice daily until you pick up the new strength.  -Call if you have questions about your medications.   NEXT APPOINTMENT: Return to clinic in 2 weeks with Dr. Aundra Dubin.  In general, to take care of your heart failure: -Limit your fluid intake to 2 Liters (half-gallon) per day.   -Limit your salt intake to ideally 2-3 grams (2000-3000 mg) per day. -Weigh yourself daily and record, and bring that "weight diary" to your next appointment.  (Weight gain of 2-3 pounds in 1 day typically means fluid weight.) -The medications for your heart are to help your heart and help you live longer.   -Please contact us before stopping any of your heart medications.  Call the clinic at [phone] with questions or to reschedule future appointments.

## 2020-04-25 ENCOUNTER — Telehealth (HOSPITAL_COMMUNITY): Payer: Self-pay | Admitting: Vascular Surgery

## 2020-04-25 ENCOUNTER — Telehealth (HOSPITAL_COMMUNITY): Payer: Self-pay | Admitting: Pharmacy Technician

## 2020-04-25 NOTE — Telephone Encounter (Signed)
Spoke with the patient today who has hit the donut hole. This has caused his Entresto and Eliquis co-pay to increase significantly. I emailed him an application for [employer] and [employer] assistance.  Will fax in once signature are obtained. The patient is aware that he needs his OOP expense report from his pharmacy as well.  Will follow up.

## 2020-04-25 NOTE — Telephone Encounter (Signed)
Samples left at front desk for pt to p/u  Medication Samples have been provided to the patient.  Drug name: [name]       Strength: 49/51mg         Qty: 2  LOT: [id number]  Exp.Date: 5/23  Dosing instructions: take 2 tabs Twice daily   The patient has been instructed regarding the correct time, dose, and frequency of taking this medication, including desired effects and most common side effects.   [name] [name] 2:10 PM 04/25/2020

## 2020-04-26 NOTE — Telephone Encounter (Signed)
Sent in both applications via fax.  Will follow up.  

## 2020-04-30 NOTE — Telephone Encounter (Signed)
Received a notification from [employer] stating that financial criteria not met. I called and they wanted to confirm the patient's financial information. The application has been sent back through to the processing department. ° °Will follow up. ° °

## 2020-05-04 ENCOUNTER — Other Ambulatory Visit: Payer: Self-pay | Admitting: Family Medicine

## 2020-05-06 NOTE — Telephone Encounter (Signed)
Advanced Heart Failure Patient Advocate Encounter   Patient was approved to receive Eliquis from [employer].  Patient ID: [id number] Effective dates: 05/01/20 through 08/02/20  He will receive his first shipment of Eliquis on 10/04.   Called [employer] to check the status of the [place] application. The representative stated that the patient needed to send in POI. The electronic verification checker showed that he made too much for assistance. Spoke with the patient and he faxed that documentation in to [employer] on Friday.   Will follow up with [employer] later in the week.

## 2020-05-08 ENCOUNTER — Ambulatory Visit (HOSPITAL_BASED_OUTPATIENT_CLINIC_OR_DEPARTMENT_OTHER)
Admission: RE | Admit: 2020-05-08 | Discharge: 2020-05-08 | Disposition: A | Discharge status: Home / Self Care | Payer: Medicare Other | Source: Ambulatory Visit | Attending: Cardiology | Admitting: Cardiology

## 2020-05-08 ENCOUNTER — Encounter (HOSPITAL_COMMUNITY): Payer: Self-pay | Admitting: Cardiology

## 2020-05-08 ENCOUNTER — Other Ambulatory Visit: Payer: Self-pay

## 2020-05-08 ENCOUNTER — Ambulatory Visit (HOSPITAL_COMMUNITY)
Admission: RE | Admit: 2020-05-08 | Discharge: 2020-05-08 | Disposition: A | Discharge status: Home / Self Care | Payer: Medicare Other | Source: Ambulatory Visit | Attending: Cardiology | Admitting: Cardiology

## 2020-05-08 VITALS — BP 142/80 | HR 63 | Wt 238.2 lb

## 2020-05-08 DIAGNOSIS — I110 Hypertensive heart disease with heart failure: Secondary | ICD-10-CM | POA: Insufficient documentation

## 2020-05-08 DIAGNOSIS — E785 Hyperlipidemia, unspecified: Secondary | ICD-10-CM | POA: Diagnosis not present

## 2020-05-08 DIAGNOSIS — I10 Essential (primary) hypertension: Secondary | ICD-10-CM | POA: Diagnosis not present

## 2020-05-08 DIAGNOSIS — I5022 Chronic systolic (congestive) heart failure: Secondary | ICD-10-CM

## 2020-05-08 DIAGNOSIS — Z7901 Long term (current) use of anticoagulants: Secondary | ICD-10-CM | POA: Insufficient documentation

## 2020-05-08 DIAGNOSIS — N400 Enlarged prostate without lower urinary tract symptoms: Secondary | ICD-10-CM | POA: Insufficient documentation

## 2020-05-08 DIAGNOSIS — Z8249 Family history of ischemic heart disease and other diseases of the circulatory system: Secondary | ICD-10-CM | POA: Insufficient documentation

## 2020-05-08 DIAGNOSIS — Z79899 Other long term (current) drug therapy: Secondary | ICD-10-CM | POA: Diagnosis not present

## 2020-05-08 DIAGNOSIS — I4892 Unspecified atrial flutter: Secondary | ICD-10-CM | POA: Insufficient documentation

## 2020-05-08 DIAGNOSIS — I484 Atypical atrial flutter: Secondary | ICD-10-CM | POA: Diagnosis not present

## 2020-05-08 DIAGNOSIS — Q2543 Congenital aneurysm of aorta: Secondary | ICD-10-CM | POA: Insufficient documentation

## 2020-05-08 LAB — BASIC METABOLIC PANEL
Anion gap: 8 (ref 5–15)
BUN: 17 mg/dL (ref 8–23)
CO2: 25 mmol/L (ref 22–32)
Calcium: 8.9 mg/dL (ref 8.9–10.3)
Chloride: 102 mmol/L (ref 98–111)
Creatinine, Ser: 0.97 mg/dL (ref 0.61–1.24)
GFR calc non Af Amer: >60 mL/min (ref >60)
Glucose, Bld: 202 mg/dL — ABNORMAL HIGH (ref 70–99)
Potassium: 4.1 mmol/L (ref 3.5–5.1)
Sodium: 135 mmol/L (ref 135–145)

## 2020-05-08 LAB — ECHOCARDIOGRAM COMPLETE
Area-P 1/2: 2.39 cm2
S' Lateral: 3.6 cm

## 2020-05-08 MED ORDER — AMLODIPINE BESYLATE 2.5 MG PO TABS: 2.5000 mg | ORAL_TABLET | Freq: Every day | ORAL | 11 refills | Status: DC

## 2020-05-08 MED ORDER — FUROSEMIDE 20 MG PO TABS
20.0000 mg | ORAL_TABLET | Freq: Every day | ORAL | 6 refills | Status: DC
Start: 2020-05-08 — End: 2020-09-10

## 2020-05-08 NOTE — Patient Instructions (Signed)
START Amlodipine to 2.5 mg, one tab daily DECREASE Lasix to 20 mg, one tab daily  Labs today We will only contact you if something comes back abnormal or we need to make some changes. Otherwise no news is good news!  Your physician recommends that you schedule a follow-up appointment in: 4 months with Dr Aundra Dubin  If you have any questions or concerns before your next appointment please send [place] a message through [place] or call our office at [phone].    TO LEAVE A MESSAGE FOR THE NURSE SELECT OPTION 2, PLEASE LEAVE A MESSAGE INCLUDING: . YOUR NAME . DATE OF BIRTH . CALL BACK NUMBER . REASON FOR CALL**this is important as we prioritize the call backs  YOU WILL RECEIVE A CALL BACK THE SAME DAY AS LONG AS YOU CALL BEFORE 4:00 PM

## 2020-05-08 NOTE — Progress Notes (Signed)
Patient ID: [name] [name], male   DOB: [birth year], 69 y.o.   MRN: [id number] PCP: Dr. Birdie Riddle Cardiology: Dr. Aundra Dubin  69 y.o. with history of aortic insufficiency s/p Ross procedure and aortic root aneurysm presents for followup of Ross procedure.  He has a left-sided brachial plexopathy of uncertain etiology and has atrophy of his left shoulder and upper arm.  He developed weakness in his right hand as well as occasional vertigo-type spells.  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    Echo in 8/18 showed no regurgitation or stenosis of autograft in aortic position, bioprosthetic pulmonic valve with mild pulmonic stenosis, mean gradient 10 mmHg.  EF was read as 40-45%, which is lower than in the past.  MRA chest 3/19 showed 5.7 cm dilation of pulmonary autograft at level of sinuses of Valsalva, MRA chest in 6/20 showed 6 cm dilation of the pulmonary autograft at the level of the sinuses of valsalva.  He is being followed for aortic root aneurysm by Dr. Servando Snare.   MRA chest in 6/21 showed aortic root 5.4 cm, no change from prior. Echo was done today and reviewed, EF 50%, RV mildly enlarged with mildly decreased systolic function, pulmonary valve autograft in aortic position with mild regurgitation but no stenosis, bioprosthetic pulmonary valve with no significant stenosis or regurgitation, ascending aorta 5.3 cm.   Patient is noted to be in atrial flutter in 8/21, rate 110s.  Looking back, his HR was elevated in the 110s at physician appointments back to 5/21 (no ECGs).  I took him for TEE-guided DCCV in 8/21.  TEE showed EF 40%, mildly decreased RV function, aortic valve replaced by pulmonary autograft with mild AI and no AS,  bioprosthetic PV looked stable.  No LA appendage thrombus, he underwent DCCV.  After DCCV, he developed exertional dyspnea and was started on Lasix.   Echo was done today and reviewed.  EF improved to 55-60%, mild RV dilation with mildly decreased systolic function, bioprosthetic PV with peak gradient 31 mmHg (mild PS), pulmonary autograft in aortic position with mild regurgitation and no stenosis.   He remains in NSR today.  No palpitations. Feeling better overall.  No significant exertional dyspnea.  No orthopnea/PND.  No chest pain.  He snores and has daytime sleepiness. Still working, owns window washing company. SBP running around 140 when he checks at home.   ECG (personally reviewed): NSR, 1st degree AVB, RBBB  Labs (2/14): K 3.7, creatinine 0.9, LDL 63, HDL 29 Labs (3/14): BNP 36 Labs (2/15): K 3.7, creatinine 0.8, LDL 61, HDL 30 Labs (4/16): K 3.7, creatinine 0.8, LDL 51, HDL 34 Labs (5/16): HCT 44.9 Labs (11/17): LDL 74, HDL 40, K 4.1, creatinine 0.77 Labs (11/18): LDL 75, HDL 35 Labs (2/19): K 4.2, creatinine 0.88 Labs (5/20): LDL 60, K 4.7, creatinine 0.81 Labs (5/21): TSH normal, LDL 71, K 4.7, creatinine 0.78 Labs (8/21): K 4.3, creatinine 0.75 => 1.02 Labs (5/21): LDL 70 Labs (9/21): K 4, creatinine 0.88  PMH: 1. Aortic insufficiency: s/p Ross procedure in 1994 in [place].   - Echo (4/11) with hypokinetic basal septum (likely post-surgical), EF 50%, mild LVH, s/p Ross procedure with native pulmonic valve in aortic position with mild aortic insufficiency  and bioprosthetic pulmonic valve with no pulmonic insufficiency, mild MR, aortic upper normal in size.  - Echo (4/13) with EF 55%, mild LVH, mild AI, mild MR, mild RV dilation, bioprosthetic pulmonic valve with peak pressure 25 mmHg.   - Echo (3/14): Mild LV dilation, mild LVH, EF 60%, pulmonary valve in aortic position (s/p Ross) with mild AI and mildly dilated ascending aorta to 4.3 cm, mildly dilated RV with normal  systolic function, bioprosthetic PVR with mean gradient 23 mmHg.  Echo (3/15) with EF 55-60%, mild LVH, mild AI with no AS, bioprosthetic pulmonic valve with peak gradient 31 mmHg, ascending aorta 4.4 cm.   - Cardiac MRI/MRA chest (5/15) with mild regurgitation of autograft aortic valve and mild regurgitation of homograft pulmonic valve, dilation of aortic root at sinuses of valsalva (4.8 cm), EF 60%, no LGE, mild RV dilation with normal RV systolic function.   - Echo (5/16) with EF 55-60%, mild autograft AI, mild MR, mildly decreased RV systolic function, biatrial enlargement, RVSP 35 mmHg, trivially increased gradient across bioprosthetic pulmonic valve (no progression).  - Echo (8/17): EF 55-60%, pulmonary autograft in aortic valve position, mild AI, bioprosthetic pulmonary valve with mild PS (peak 22 mmHg), moderate RV dilation with normal systolic function.  - Echo (8/18): EF 40-45%, mildly increased gradient across bioprosthetic pulmonary valve (mean 10 mmHg), s/p Ross procedure with no stenosis or significant regurgitation of pulmonary autograft in aortic position.  - Echo (8/21): EF 50%, RV mildly enlarged with mildly decreased systolic function, pulmonary valve autograft in aortic position with mild regurgitation but no stenosis, bioprosthetic pulmonary valve with no significant stenosis or regurgitation, ascending aorta 5.3 cm.  - TEE (8/21): EF 40%, mildly decreased RV function, aortic valve replaced by pulmonary autograft with mild AI and no AS, bioprosthetic PV looked stable. - Echo (10/21): EF improved to 55-60%, mild RV dilation with mildly decreased systolic function, bioprosthetic PV with peak gradient 31 mmHg (mild PS), pulmonary autograft in aortic position with mild regurgitation and not stenosis.  2. Post-operative atrial fibrillation after heart surgery.  3. Normal left heart cath prior to 1994 heart surgery.  4. Left brachial plexopathy with left shoulder muscle atrophy.  Uncertain  etiology.  5. BPH 6. Hyperlipidemia.  7. Cervical spinal stenosis with radiculopathy.   8. 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  9. ETT-Sestamibi (3/14): No ischemia or infarction.  10. Carotid dopplers (6/14): mild disease only.  11. Sinus of valsalva aneurysm: MRA chest 2/18 with dilation of pulmonary autograft at level of sinuses of Valsalva to 5.4 cm when measured into left cusp.  - MRA chest (3/19): Saccular dilatation of the left and right sinuses of valsalva, 5.7 cm aortic root diameter.   - MRA chest (6/20): 6 cm dilated sinuses of valsalva, 4.6 cm ascending aorta.  - MRA chest (6/21): aortic root 5.4 cm, no change from prior 12. Elevated left hemidiaphragm.  13. Atrial flutter: Atypical.  Noted in 8/21 => underwent DCCV to NSR. Marland Kitchen   FH: Prostate CA, HTN  SH: Lives in [place], owns a window washing business, married, nonsmoker.   ROS: All systems reviewed and negative except as per HPI.    Current Outpatient Medications  Medication Sig Dispense Refill  . apixaban (ELIQUIS) 5 MG TABS tablet Take 1 tablet (5 mg total) by mouth 2 (two) times daily. 60 tablet 11  . cetirizine (ZYRTEC) 10 MG tablet TAKE 1 TABLET(10 MG) BY  MOUTH DAILY 90 tablet 1  . finasteride (PROSCAR) 5 MG tablet TAKE 1 TABLET(5 MG) BY MOUTH DAILY 90 tablet 1  . fluticasone (FLONASE) 50 MCG/ACT nasal spray Place 2 sprays into both nostrils as needed for allergies or rhinitis. SHAKE LIQUID AND USE 2 SPRAYS DAILY AS NEEDED    . furosemide (LASIX) 20 MG tablet Take 1 tablet (20 mg total) by mouth daily. 30 tablet 6  . metoprolol succinate (TOPROL-XL) 100 MG 24 hr tablet Take 1 tablet (100 mg total) by mouth 2 (two) times daily. 180 tablet 3  . omeprazole (PRILOSEC) 40 MG capsule TAKE 1 CAPSULE(40 MG) BY MOUTH DAILY 90 capsule 1  . sacubitril-valsartan (ENTRESTO) 97-103 MG Take 1 tablet by mouth 2 (two) times daily. 180 tablet 3  . simvastatin  (ZOCOR) 20 MG tablet Take 20 mg by mouth daily.    Marland Kitchen spironolactone (ALDACTONE) 25 MG tablet Take 0.5 tablets (12.5 mg total) by mouth daily. 15 tablet 6  . Wheat Dextrin (BENEFIBER) POWD Take 2 Scoops by mouth daily.    Marland Kitchen amLODipine (NORVASC) 2.5 MG tablet Take 1 tablet (2.5 mg total) by mouth daily. 30 tablet 11   No current facility-administered medications for this encounter.    BP (!) 142/80   Pulse 63   Wt 108 kg (238 lb 3.2 oz)   SpO2 98%   BMI 29.77 kg/m  General: NAD Neck: No JVD, no thyromegaly or thyroid nodule.  Lungs: Clear to auscultation bilaterally with normal respiratory effort. CV: Nondisplaced PMI.  Heart regular S1/S2 with widely split S2, no S3/S4, 1/6 SEM RUSB.  No peripheral edema.  No carotid bruit.  Normal pedal pulses.  Abdomen: Soft, nontender, no hepatosplenomegaly, no distention.  Skin: Intact without lesions or rashes.  Neurologic: Alert and oriented x 3.  Psych: Normal affect. Extremities: No clubbing or cyanosis.  HEENT: Normal.   Assessment/Plan: 1. Atrial flutter:  Likely related to prior cardiac surgery.  He had peri-operative AF at time of heart surgery but had not been noted to have recurrent arrhythmias until 8/21.  In 8/21, he was noted to be in atrial flutter with RVR, based on review of HR over the past few months, he had probably been in atrial flutter since at least 5/21. He had DCCV in 8/21 and is in NSR today. He saw Dr. Rayann Heman, ablation offered if flutter recurs.  - Continue Eliquis 5 mg bid.  - Continue Toprol XL 100 mg bid.  2.  History of Ross Procedure: Echo done today showed pulmonary autograft in aortic position with mild AI, no AS.  The bioprosthetic pulmonary valve had a mean gradient of 31 mmHg, suggesting mild stenosis.   - Needs endocarditis prophylaxis with dental work.   3.  Aortic root aneurym: This not uncommonly accompanies Ross procedure.  The pulmonary autograft at sinuses of valsalva measured 5.4 cm by MRA chest in 6/21.   This was reviewed by Dr. Servando Snare and thought to have been stable over the last few years, continued observation.   - Per Dr. Servando Snare, would hold off on elective repair unless there is significant AI.   - Continue blood pressure control, goal SBP 120s or lower.  - Repeat MRA chest in 6/22.  4. HTN: Given aneurysm, need to make sure that BP is well-controlled, running high around 546 systolic.  - I will add amlodipine 2.5 mg daily.  5. Hyperlipidemia: Lipids acceptable when recently checked.  6. Chronic systolic CHF: TEE in 5/68 in setting of long-standing  atrial flutter with RVR showed EF down to 40%. 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  He was cardioverted in 8/21 but developed exertional dyspnea with volume overload afterwards.  Today's echo shows EF up to 55-60%, suggesting that he indeed had a tachy-mediated CMP.  He is not volume overloaded on exam, NYHA class I-II symptoms now.     - I think we can decrease Lasix to 20 mg daily.   - Continue Toprol XL.  - Continue Entresto 49/51 bid.  - Continue spironolactone 25 mg daily. BMET today.  7. OSA: Suspected based on daytime sleepiness and snoring.   - Arrange for sleep study.   Followup in 4 months.   Loralie Champagne 05/08/2020

## 2020-05-08 NOTE — Progress Notes (Signed)
Echocardiogram 2D Echocardiogram has been performed.  [name] [name] 05/08/2020, 3:00 PM

## 2020-05-09 ENCOUNTER — Other Ambulatory Visit (HOSPITAL_COMMUNITY): Payer: Self-pay

## 2020-05-09 ENCOUNTER — Encounter (HOSPITAL_COMMUNITY): Payer: Self-pay

## 2020-05-09 NOTE — Telephone Encounter (Signed)
Patient checked his medication and realized he did not have enough to last through the weekend. There will be a sample bottle for him to pick up while we wait for a determination from [employer].

## 2020-05-09 NOTE — Progress Notes (Unsigned)
Medication Samples have been provided to the patient.  Drug name: [name]       Strength: 49/51mg         Qty: 1 bottle (28 tablets)  LOT: [id number]  Exp.Date: 12/2021  Dosing instructions: 2 tablets twice daily  The patient has been instructed regarding the correct time, dose, and frequency of taking this medication, including desired effects and most common side effects.   Park Pope Almyra Birman 2:21 PM 05/09/2020

## 2020-05-09 NOTE — Telephone Encounter (Signed)
[employer]. They did receive the patient's POI and should have a determination by Friday.  Will follow up.

## 2020-05-09 NOTE — Telephone Encounter (Signed)
Spoke with the patient. He has enough on hand to last through Monday at least. We will talk Monday about getting samples, if [employer] has not been able to give a determination and ship the medication by then.  Will follow up.

## 2020-05-10 ENCOUNTER — Other Ambulatory Visit (HOSPITAL_COMMUNITY): Payer: Medicare Other

## 2020-05-10 ENCOUNTER — Encounter (HOSPITAL_COMMUNITY): Payer: Medicare Other | Admitting: Cardiology

## 2020-05-10 NOTE — Telephone Encounter (Signed)
Advanced Heart Failure Patient Advocate Encounter   Patient was approved to receive Entresto from [employer].  Patient ID: [id number] Effective dates: 05/09/20 through 08/02/20  Charlann Boxer, CPhT

## 2020-05-13 NOTE — Telephone Encounter (Signed)
Encounter open in error 

## 2020-06-14 ENCOUNTER — Telehealth: Payer: Self-pay

## 2020-06-14 ENCOUNTER — Telehealth (HOSPITAL_COMMUNITY): Payer: Self-pay | Admitting: Pharmacy Technician

## 2020-06-14 ENCOUNTER — Other Ambulatory Visit: Payer: Self-pay | Admitting: Family Medicine

## 2020-06-14 NOTE — Telephone Encounter (Signed)
Patient called in wanting advice on which OTC products he should take in regards to congestin and cough considering the medications he is taking.  Consulted with Lauren, [facility] and she recommended that he could take Mucinex and Chlorphenamine. Also, she wanted to remind the patient to stay hydrated.  Called and left the patient a message with this information.   Charlann Boxer, CPhT

## 2020-06-14 NOTE — Telephone Encounter (Signed)
Patient was triaged this afternoon for his visit Monday morning.    Patient states he has had a slight runny nose and cough the last two days which he believes is due to cutting grass.  Denies any other symptoms or being around anyone who he believes to have had COVID.    I informed patient that we would call him Monday morning if we needed to reschedule his appt.    Patient understood.  Please advise.

## 2020-06-17 ENCOUNTER — Telehealth (INDEPENDENT_AMBULATORY_CARE_PROVIDER_SITE_OTHER): Payer: Medicare Other | Admitting: Family Medicine

## 2020-06-17 ENCOUNTER — Encounter: Payer: Self-pay | Admitting: Family Medicine

## 2020-06-17 DIAGNOSIS — J069 Acute upper respiratory infection, unspecified: Secondary | ICD-10-CM

## 2020-06-17 NOTE — Telephone Encounter (Signed)
Called patient this morning, he stated he was about to call to office to check if he should still come in. He said that he still has cough, runny nose and congestion, no fever. I advised that we can either reschedule his physical or change his visit to virtual visit to go over his symptoms. He prefers to have a virtual visit with Dr. Birdie Riddle. Appointment changed to Mychart visit and he made aware that Dr. Virgil Benedict CMA will call him at his appt time to get him set up for his virtual visit. He voiced understanding and thanks [place] for the call.

## 2020-06-17 NOTE — Telephone Encounter (Signed)
Can you please call him?

## 2020-06-17 NOTE — Progress Notes (Signed)
Virtual Visit via Video   I connected with patient on 06/17/20 at 10:00 AM EST by a video enabled telemedicine application and verified that I am speaking with the correct person using two identifiers.  Location patient: Home Location provider: [facility], Office Persons participating in the virtual visit: Patient, Provider, [place] (Sabrina M)  I discussed the limitations of evaluation and management by telemedicine and the availability of in person appointments. The patient expressed understanding and agreed to proceed.  Subjective:   HPI:   URI- 'I guess I got a cold from somebody'.  + sinus drainage x1 week, cough x2 days.  No fever.  'I have not really felt bad at all'.  No body aches.  Energy level is good.  Denies facial pain/pressure.  Cough has started to be productive- 'thick brownish color'.  Wife has same sxs.  Biggest concern has been constant drainage.  Using Zyrtec and Flonase regularly.  ROS:   See pertinent positives and negatives per HPI.  Patient Active Problem List   Diagnosis Date Noted  . Hyperlipidemia 08/08/2008    Priority: High  . Essential hypertension 08/08/2008    Priority: High  . Abdominal bloating 12/14/2019  . Pre-diabetes 12/14/2019  . Obesity (BMI 30-39.9) 06/16/2019  . Headache syndrome 10/18/2018  . Laryngopharyngeal reflux (LPR) 08/10/2016  . BPH (benign prostatic hyperplasia) 06/03/2015  . H/O Ross procedure 01/31/2015  . Thoracic aortic aneurysm ([place]) 12/11/2013  . Ganglion cyst 06/01/2013  . Brachial plexus neuropathy 10/07/2011  . Cervical disc disease 10/07/2011  . General medical examination 03/11/2011  . Carpopedal spasm 02/25/2011  . Arthritis of hand 02/25/2011  . CARPAL TUNNEL SYNDROME, BILATERAL 06/10/2009  . AORTIC VALVE REPLACEMENT, HX OF 06/10/2009  . ESOPHAGEAL STRICTURE 03/29/2003  . GERD 03/29/2003  . HIATAL HERNIA 10/21/2000    Social History   Tobacco Use  . Smoking status: Never Smoker  . Smokeless  tobacco: Never Used  Substance Use Topics  . Alcohol use: No    Current Outpatient Medications:  .  amLODipine (NORVASC) 2.5 MG tablet, Take 1 tablet (2.5 mg total) by mouth daily., Disp: 30 tablet, Rfl: 11 .  apixaban (ELIQUIS) 5 MG TABS tablet, Take 1 tablet (5 mg total) by mouth 2 (two) times daily., Disp: 60 tablet, Rfl: 11 .  cetirizine (ZYRTEC) 10 MG tablet, TAKE 1 TABLET(10 MG) BY MOUTH DAILY, Disp: 90 tablet, Rfl: 1 .  finasteride (PROSCAR) 5 MG tablet, TAKE 1 TABLET(5 MG) BY MOUTH DAILY, Disp: 90 tablet, Rfl: 1 .  fluticasone (FLONASE) 50 MCG/ACT nasal spray, SHAKE LIQUID AND USE 2 SPRAYS IN EACH NOSTRIL DAILY, Disp: 16 g, Rfl: 0 .  furosemide (LASIX) 20 MG tablet, Take 1 tablet (20 mg total) by mouth daily., Disp: 30 tablet, Rfl: 6 .  metoprolol succinate (TOPROL-XL) 100 MG 24 hr tablet, Take 1 tablet (100 mg total) by mouth 2 (two) times daily., Disp: 180 tablet, Rfl: 3 .  omeprazole (PRILOSEC) 40 MG capsule, TAKE 1 CAPSULE(40 MG) BY MOUTH DAILY, Disp: 90 capsule, Rfl: 1 .  sacubitril-valsartan (ENTRESTO) 97-103 MG, Take 1 tablet by mouth 2 (two) times daily., Disp: 180 tablet, Rfl: 3 .  simvastatin (ZOCOR) 20 MG tablet, Take 20 mg by mouth daily., Disp: , Rfl:  .  spironolactone (ALDACTONE) 25 MG tablet, Take 0.5 tablets (12.5 mg total) by mouth daily., Disp: 15 tablet, Rfl: 6 .  Wheat Dextrin (BENEFIBER) POWD, Take 2 Scoops by mouth daily., Disp: , Rfl:   Allergies  Allergen Reactions  .  Penicillins Hives and Swelling  . Quinidine Other (See Comments)    Increase heart beat per patient  . Quinolones     Unknown reaction     Objective:   There were no vitals taken for this visit. AAOx3, NAD NCAT, EOMI No obvious CN deficits Coloring WNL Pt is able to speak clearly, coherently without shortness of breath or increased work of breathing.  Thought process is linear.  Mood is appropriate.   Assessment and Plan:   Viral URI- new.  Pt's sxs are improving w/o intervention.   No need for abx.  Reviewed supportive care (rest, fluids, Coricidin HBP/Mucinex) and red flags that should prompt return.  Pt expressed understanding and is in agreement w/ plan.    Annye Asa, MD 06/17/2020

## 2020-06-17 NOTE — Progress Notes (Signed)
I connected with  [name] [name] on 06/17/20 by a video enabled telemedicine application and verified that I am speaking with the correct person using two identifiers.   I discussed the limitations of evaluation and management by telemedicine. The patient expressed understanding and agreed to proceed.

## 2020-07-02 ENCOUNTER — Other Ambulatory Visit: Payer: Self-pay | Admitting: Family Medicine

## 2020-07-02 NOTE — Telephone Encounter (Signed)
Please advise, no last fill date provided and does not indicate you are prescribing this med

## 2020-07-12 NOTE — Progress Notes (Signed)
Subjective:   [name] [name] is a 69 y.o. male who presents for Medicare Annual/Subsequent preventive examination.  I connected with [place] today by telephone and verified that I am speaking with the correct person using two identifiers. Location patient: home Location provider: work Persons participating in the virtual visit: patient, Marine scientist.    I discussed the limitations, risks, security and privacy concerns of performing an evaluation and management service by telephone and the availability of in person appointments. I also discussed with the patient that there may be a patient responsible charge related to this service. The patient expressed understanding and verbally consented to this telephonic visit.    Interactive audio and video telecommunications were attempted between this provider and patient, however failed, due to patient having technical difficulties OR patient did not have access to video capability.  We continued and completed visit with audio only.  Some vital signs may be absent or patient reported.   Time Spent with patient on telephone encounter: 20 minutes   Review of Systems     Cardiac Risk Factors include: advanced age (>45men, >1 women);hypertension;dyslipidemia     Objective:    Today's Vitals   07/15/20 0900  Weight: 238 lb (108 kg)  Height: 6\' 3"  (1.905 m)   Body mass index is 29.75 kg/m.  Advanced Directives 07/15/2020 03/14/2020 06/10/2017 03/29/2014  Does Patient Have a Medical Advance Directive? Yes Yes Yes Yes  Type of Paramedic of [place];Living will Healthcare Power of [place];Living will [place];Living will  Copy of [place] in Chart? No - copy requested Yes - validated most recent copy scanned in chart (See row information) No - copy requested No - copy requested  Would patient like information on creating a medical advance directive? - - - No  - patient declined information    Current Medications (verified) Outpatient Encounter Medications as of 07/15/2020  Medication Sig  . amLODipine (NORVASC) 2.5 MG tablet Take 1 tablet (2.5 mg total) by mouth daily.  Marland Kitchen apixaban (ELIQUIS) 5 MG TABS tablet Take 1 tablet (5 mg total) by mouth 2 (two) times daily.  . cetirizine (ZYRTEC) 10 MG tablet TAKE 1 TABLET(10 MG) BY MOUTH DAILY  . finasteride (PROSCAR) 5 MG tablet TAKE 1 TABLET(5 MG) BY MOUTH DAILY  . fluticasone (FLONASE) 50 MCG/ACT nasal spray SHAKE LIQUID AND USE 2 SPRAYS IN EACH NOSTRIL DAILY  . furosemide (LASIX) 20 MG tablet Take 1 tablet (20 mg total) by mouth daily.  . metoprolol succinate (TOPROL-XL) 100 MG 24 hr tablet Take 1 tablet (100 mg total) by mouth 2 (two) times daily.  Marland Kitchen omeprazole (PRILOSEC) 40 MG capsule TAKE 1 CAPSULE(40 MG) BY MOUTH DAILY  . sacubitril-valsartan (ENTRESTO) 97-103 MG Take 1 tablet by mouth 2 (two) times daily.  . simvastatin (ZOCOR) 20 MG tablet TAKE 1 TABLET(20 MG) BY MOUTH DAILY  . spironolactone (ALDACTONE) 25 MG tablet Take 0.5 tablets (12.5 mg total) by mouth daily.  . Wheat Dextrin (BENEFIBER) POWD Take 2 Scoops by mouth daily.   No facility-administered encounter medications on file as of 07/15/2020.    Allergies (verified) Penicillins, Quinidine, and Quinolones   History: Past Medical History:  Diagnosis Date  . Acute gastritis   . Aortic insufficiency    s/p aortic valve replacement in 1994  . Early satiety   . Esophageal stricture   . GERD (gastroesophageal reflux disease)   . Headache syndrome 10/18/2018  . Hiatal hernia   .  Hyperlipidemia   . Neuropathy    left brachial plexus  . Pain, dental    Past Surgical History:  Procedure Laterality Date  . AORTIC VALVE REPLACEMENT  1994  . CARDIOVERSION N/A 03/14/2020   Procedure: CARDIOVERSION;  Surgeon: [name], MD;  Location: [facility] ENDOSCOPY;  Service: Cardiovascular;  Laterality: N/A;  . GANGLION CYST EXCISION  JAN 2002   . HIATAL HERNIA REPAIR  DEC 2002  . TEE WITHOUT CARDIOVERSION N/A 03/14/2020   Procedure: TRANSESOPHAGEAL ECHOCARDIOGRAM (TEE);  Surgeon: [name], MD;  Location: [facility] ENDOSCOPY;  Service: Cardiovascular;  Laterality: N/A;  . TONSILLECTOMY    . VASECTOMY  DEC 2002   Family History  Problem Relation Age of Onset  . Lung cancer Father        hx of smoking   . Hypertension Other   . Prostate cancer Other   . Leukemia Brother        Passed at age of 11  . Colon cancer Neg Hx   . Pancreatic cancer Neg Hx   . Rectal cancer Neg Hx   . Stomach cancer Neg Hx    Social History   Socioeconomic History  . Marital status: Married    Spouse name: Belenda Cruise   . Number of children: 3  . Years of education: Not on file  . Highest education level: Master's degree (e.g., MA, MS, MEng, MEd, MSW, MBA)  Occupational History  . Occupation: retired  Tobacco Use  . Smoking status: Never Smoker  . Smokeless tobacco: Never Used  Vaping Use  . Vaping Use: Never used  Substance and Sexual Activity  . Alcohol use: No  . Drug use: No  . Sexual activity: Not on file  Other Topics Concern  . 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 Difficulty of Paying Living Expenses: Not hard at all  Food Insecurity: No Food Insecurity  . Worried About Charity fundraiser in the Last Year: Never true  . Ran Out of Food in the Last Year: Never true  Transportation Needs: No Transportation Needs  . Lack of Transportation (Medical): No  . Lack of Transportation (Non-Medical): No  Physical Activity: Sufficiently Active  . Days of Exercise per Week: 3 days  . Minutes of Exercise per Session: 90 min  Stress: No Stress Concern Present  . Feeling of Stress : Not at all  Social Connections:  Moderately Isolated  . Frequency of Communication with Friends and Family: More than three times a week  . Frequency of Social Gatherings with Friends and Family: More than three times a week  . Attends Religious Services: Never  . Active Member of Clubs or Organizations: No  . Attends Archivist Meetings: Never  . Marital Status: Married    Tobacco Counseling Counseling given: Not Answered   Clinical Intake:  Pre-visit preparation completed: Yes  Pain : No/denies pain     Nutritional Status: BMI 25 -29 Overweight Nutritional Risks: None Diabetes: No  How often do you need to have someone help you when you read instructions, pamphlets, or other written materials from your doctor or pharmacy?: 1 - Never What is the last grade level you completed in school?: Master's degree  Diabetic?No  Interpreter Needed?: No  Information entered by :: Dustin Flock LPN   Activities of Daily Living In your present state of health, do you have any difficulty performing the following activities: 07/15/2020 06/17/2020  Hearing? N N  Vision? N N  Difficulty concentrating or making decisions? N N  Walking or climbing stairs? N N  Dressing or bathing? N N  Doing errands, shopping? N N  Preparing Food and eating ? N -  Using the Toilet? N -  In the past six months, have you accidently leaked urine? N -  Do you have problems with loss of bowel control? N -  Managing your Medications? N -  Managing your Finances? N -  Housekeeping or managing your Housekeeping? N -  Some recent data might be hidden    Patient Care Team: [name], MD as PCP - General [name], MD as Consulting Physician (Cardiology) Grace Isaac, MD as Consulting Physician (Cardiothoracic Surgery) Rana Snare, MD as Consulting Physician (Urology) [place], Jennefer Bravo, MD as Referring Physician (Dermatology) Melissa Montane, MD as Consulting Physician (Otolaryngology)  Indicate any  recent Medical Services you may have received from other than [facility] providers in the past year (date may be approximate).     Assessment:   This is a routine wellness examination for [name] [name].  Hearing/Vision screen  Hearing Screening   125Hz  250Hz  500Hz  1000Hz  2000Hz  3000Hz  4000Hz  6000Hz  8000Hz   Right ear:           Left ear:           Comments: No issues  Vision Screening Comments: Wears glasses Last eye exam-03/2020-[facility]  Dietary issues and exercise activities discussed: Current Exercise Habits: Home exercise routine, Type of exercise: walking (golfing), Time (Minutes): 60, Frequency (Times/Week): 3, Weekly Exercise (Minutes/Week): 180, Intensity: Mild, Exercise limited by: None identified  Goals      Patient Stated   .  patient (pt-stated)      Maintain current health by staying active.       Depression Screen PHQ 2/9 Scores 07/15/2020 06/17/2020 12/14/2019 06/16/2019 12/15/2018 09/13/2018 06/14/2018  PHQ - 2 Score 0 0 0 0 0 0 0  PHQ- 9 Score - 0 0 0 - - 0    Fall Risk Fall Risk  07/15/2020 06/17/2020 12/14/2019 06/16/2019 06/16/2019  Falls in the past year? 0 0 0 0 0  Number falls in past yr: 0 0 0 0 0  Injury with Fall? 0 0 0 0 0  Risk for fall due to : - No Fall Risks - - -  Follow up Falls prevention discussed - Falls evaluation completed Falls evaluation completed Falls evaluation completed    [place]:  Any stairs in or around the home? Yes  If so, are there any without handrails? No  Home free of loose throw rugs in walkways, pet beds, electrical cords, etc? Yes  Adequate lighting in your home to reduce risk of falls? Yes   ASSISTIVE DEVICES UTILIZED TO PREVENT FALLS:  Life alert? No  Use of a cane, walker or w/c? No  Grab bars in the bathroom? Yes  Shower chair or bench in shower? No  Elevated toilet seat or a handicapped toilet? No   TIMED UP AND GO:  Was the test performed? No . Phone  visit   Cognitive Function:Normal cognitive status assessed  by this Nurse Health Advisor. No abnormalities found.          Immunizations  Immunization History  Administered Date(s) Administered  . Fluad Quad(high Dose 65+) 06/16/2019  . Influenza Split 07/07/2011  . Influenza Whole 05/10/2008, 05/29/2010  . Influenza, High Dose Seasonal PF 06/14/2018  . Influenza, Seasonal, Injecte, Preservative Fre 07/12/2012  . Influenza,inj,Quad PF,6+ Mos 05/25/2013, 05/31/2014, 06/03/2015, 06/08/2016, 04/29/2017  . Moderna Sars-Covid-2 Vaccination 09/25/2019, 10/24/2019  . Pneumococcal Conjugate-13 06/14/2018  . Pneumococcal Polysaccharide-23 06/16/2019  . Tdap 07/12/2012  . Zoster 09/08/2012    TDAP status: Up to date  Flu Vaccine status: Up to date  Pneumococcal vaccine status: Up to date  Covid-19 vaccine status: Completed vaccines  Qualifies for Shingles Vaccine? Yes   Zostavax completed Yes   Shingrix Completed?: No.    Education has been provided regarding the importance of this vaccine. Patient has been advised to call insurance company to determine out of pocket expense if they have not yet received this vaccine. Advised may also receive vaccine at local pharmacy or Health Dept. Verbalized acceptance and understanding.  Screening Tests Health Maintenance  Topic Date Due  . COVID-19 Vaccine (3 - Moderna risk 4-dose series) 11/21/2019  . INFLUENZA VACCINE  03/03/2020  . Hepatitis C Screening  07/31/2020 (Originally October 17, 1950)  . TETANUS/TDAP  07/12/2022  . COLONOSCOPY  05/14/2024  . PNA vac Low Risk Adult  Completed    Health Maintenance  Health Maintenance Due  Topic Date Due  . COVID-19 Vaccine (3 - Moderna risk 4-dose series) 11/21/2019  . INFLUENZA VACCINE  03/03/2020    Colorectal cancer screening: Type of screening: Colonoscopy. Completed 05/14/2014. Repeat every 10 years  Lung Cancer Screening: (Low Dose CT Chest recommended if Age 43-80 years, 30 pack-year  currently smoking OR have quit w/in 15years.) does not qualify.    Additional Screening:  Hepatitis C Screening: does qualify; Discuss with PCP  Vision Screening: Recommended annual ophthalmology exams for early detection of glaucoma and other disorders of the eye. Is the patient up to date with their annual eye exam?  Yes  Who is the provider or what is the name of the office in which the patient attends annual eye exams? [facility]    Dental Screening: Recommended annual dental exams for proper oral hygiene  Community Resource Referral / Chronic Care Management: CRR required this visit?  No   CCM required this visit?  No      Plan:     I have personally reviewed and noted the following in the patient's chart:   . Medical and social history . Use of alcohol, tobacco or illicit drugs  . Current medications and supplements . Functional ability and status . Nutritional status . Physical activity . Advanced directives . List of other physicians . Hospitalizations, surgeries, and ER visits in previous 12 months . Vitals . Screenings to include cognitive, depression, and falls . Referrals and appointments  In addition, I have reviewed and discussed with patient certain preventive protocols, quality metrics, and best practice recommendations. A written personalized care plan for preventive services as well as general preventive health recommendations were provided to patient.   Due to this being a telephonic visit, the after visit summary with patients personalized plan was offered to patient via mail or my-chart. Patient would like to access on my-chart.   Marta Antu, LPN   [id number]  Nurse Health Advisor  Nurse Notes: None

## 2020-07-15 ENCOUNTER — Ambulatory Visit (INDEPENDENT_AMBULATORY_CARE_PROVIDER_SITE_OTHER): Payer: Medicare Other

## 2020-07-15 VITALS — Ht 75.0 in | Wt 238.0 lb

## 2020-07-15 DIAGNOSIS — Z0000 Encounter for general adult medical examination without abnormal findings: Secondary | ICD-10-CM

## 2020-07-15 NOTE — Patient Instructions (Signed)
[name] , Thank you for taking time to complete your Medicare Wellness Visit. I appreciate your ongoing commitment to your health goals. Please review the following plan we discussed and let me know if I can assist you in the future.   Screening recommendations/referrals: Colonoscopy: Completed 05/14/2014-Due 05/14/2024 Recommended yearly ophthalmology/optometry visit for glaucoma screening and checkup Recommended yearly dental visit for hygiene and checkup  Vaccinations: Influenza vaccine: Up to date Pneumococcal vaccine: Completed vaccines Tdap vaccine: Up to date-Due 07/12/2022 Shingles vaccine: Discuss with pharmacy   Covid-19: Completed vaccines  Advanced directives: Please bring a copy for your chart  Conditions/risks identified: See problem list  Next appointment: Follow up in one year for your annual wellness visit. 07/21/2021 @ 9:00  Preventive Care 68 Years and Older, Male Preventive care refers to lifestyle choices and visits with your health care provider that can promote health and wellness. What does preventive care include?  A yearly physical exam. This is also called an annual well check.  Dental exams once or twice a year.  Routine eye exams. Ask your health care provider how often you should have your eyes checked.  Personal lifestyle choices, including:  Daily care of your teeth and gums.  Regular physical activity.  Eating a healthy diet.  Avoiding tobacco and drug use.  Limiting alcohol use.  Practicing safe sex.  Taking low doses of aspirin every day.  Taking vitamin and mineral supplements as recommended by your health care provider. What happens during an annual well check? The services and screenings done by your health care provider during your annual well check will depend on your age, overall health, lifestyle risk factors, and family history of disease. Counseling  Your health care provider may ask you questions about your:  Alcohol  use.  Tobacco use.  Drug use.  Emotional well-being.  Home and relationship well-being.  Sexual activity.  Eating habits.  History of falls.  Memory and ability to understand (cognition).  Work and work Statistician. Screening  You may have the following tests or measurements:  Height, weight, and BMI.  Blood pressure.  Lipid and cholesterol levels. These may be checked every 5 years, or more frequently if you are over 43 years old.  Skin check.  Lung cancer screening. You may have this screening every year starting at age 33 if you have a 30-pack-year history of smoking and currently smoke or have quit within the past 15 years.  Fecal occult blood test (FOBT) of the stool. You may have this test every year starting at age 28.  Flexible sigmoidoscopy or colonoscopy. You may have a sigmoidoscopy every 5 years or a colonoscopy every 10 years starting at age 60.  Prostate cancer screening. Recommendations will vary depending on your family history and other risks.  Hepatitis C blood test.  Hepatitis B blood test.  Sexually transmitted disease (STD) testing.  Diabetes screening. This is done by checking your blood sugar (glucose) after you have not eaten for a while (fasting). You may have this done every 1-3 years.  Abdominal aortic aneurysm (AAA) screening. You may need this if you are a current or former smoker.  Osteoporosis. You may be screened starting at age 47 if you are at high risk. Talk with your health care provider about your test results, treatment options, and if necessary, the need for more tests. Vaccines  Your health care provider may recommend certain vaccines, such as:  Influenza vaccine. This is recommended every year.  Tetanus, diphtheria, and  acellular pertussis (Tdap, Td) vaccine. You may need a Td booster every 10 years.  Zoster vaccine. You may need this after age 72.  Pneumococcal 13-valent conjugate (PCV13) vaccine. One dose is  recommended after age 48.  Pneumococcal polysaccharide (PPSV23) vaccine. One dose is recommended after age 17. Talk to your health care provider about which screenings and vaccines you need and how often you need them. This information is not intended to replace advice given to you by your health care provider. Make sure you discuss any questions you have with your health care provider. Document Released: 08/16/2015 Document Revised: 04/08/2016 Document Reviewed: 05/21/2015 Elsevier Interactive Patient Education  2017 [place] Prevention in the Home Falls can cause injuries. They can happen to people of all ages. There are many things you can do to make your home safe and to help prevent falls. What can I do on the outside of my home?  Regularly fix the edges of walkways and driveways and fix any cracks.  Remove anything that might make you trip as you walk through a door, such as a raised step or threshold.  Trim any bushes or trees on the path to your home.  Use bright outdoor lighting.  Clear any walking paths of anything that might make someone trip, such as rocks or tools.  Regularly check to see if handrails are loose or broken. Make sure that both sides of any steps have handrails.  Any raised decks and porches should have guardrails on the edges.  Have any leaves, snow, or ice cleared regularly.  Use sand or salt on walking paths during winter.  Clean up any spills in your garage right away. This includes oil or grease spills. What can I do in the bathroom?  Use night lights.  Install grab bars by the toilet and in the tub and shower. Do not use towel bars as grab bars.  Use non-skid mats or decals in the tub or shower.  If you need to sit down in the shower, use a plastic, non-slip stool.  Keep the floor dry. Clean up any water that spills on the floor as soon as it happens.  Remove soap buildup in the tub or shower regularly.  Attach bath mats  securely with double-sided non-slip rug tape.  Do not have throw rugs and other things on the floor that can make you trip. What can I do in the bedroom?  Use night lights.  Make sure that you have a light by your bed that is easy to reach.  Do not use any sheets or blankets that are too big for your bed. They should not hang down onto the floor.  Have a firm chair that has side arms. You can use this for support while you get dressed.  Do not have throw rugs and other things on the floor that can make you trip. What can I do in the kitchen?  Clean up any spills right away.  Avoid walking on wet floors.  Keep items that you use a lot in easy-to-reach places.  If you need to reach something above you, use a strong step stool that has a grab bar.  Keep electrical cords out of the way.  Do not use floor polish or wax that makes floors slippery. If you must use wax, use non-skid floor wax.  Do not have throw rugs and other things on the floor that can make you trip. What can I do with my stairs?  Do not leave any items on the stairs.  Make sure that there are handrails on both sides of the stairs and use them. Fix handrails that are broken or loose. Make sure that handrails are as long as the stairways.  Check any carpeting to make sure that it is firmly attached to the stairs. Fix any carpet that is loose or worn.  Avoid having throw rugs at the top or bottom of the stairs. If you do have throw rugs, attach them to the floor with carpet tape.  Make sure that you have a light switch at the top of the stairs and the bottom of the stairs. If you do not have them, ask someone to add them for you. What else can I do to help prevent falls?  Wear shoes that:  Do not have high heels.  Have rubber bottoms.  Are comfortable and fit you well.  Are closed at the toe. Do not wear sandals.  If you use a stepladder:  Make sure that it is fully opened. Do not climb a closed  stepladder.  Make sure that both sides of the stepladder are locked into place.  Ask someone to hold it for you, if possible.  Clearly mark and make sure that you can see:  Any grab bars or handrails.  First and last steps.  Where the edge of each step is.  Use tools that help you move around (mobility aids) if they are needed. These include:  Canes.  Walkers.  Scooters.  Crutches.  Turn on the lights when you go into a dark area. Replace any light bulbs as soon as they burn out.  Set up your furniture so you have a clear path. Avoid moving your furniture around.  If any of your floors are uneven, fix them.  If there are any pets around you, be aware of where they are.  Review your medicines with your doctor. Some medicines can make you feel dizzy. This can increase your chance of falling. Ask your doctor what other things that you can do to help prevent falls. This information is not intended to replace advice given to you by your health care provider. Make sure you discuss any questions you have with your health care provider. Document Released: 05/16/2009 Document Revised: 12/26/2015 Document Reviewed: 08/24/2014 Elsevier Interactive Patient Education  2017 [employer].

## 2020-07-18 ENCOUNTER — Ambulatory Visit (INDEPENDENT_AMBULATORY_CARE_PROVIDER_SITE_OTHER): Payer: Medicare Other | Admitting: Family Medicine

## 2020-07-18 ENCOUNTER — Other Ambulatory Visit: Payer: Self-pay

## 2020-07-18 ENCOUNTER — Other Ambulatory Visit (INDEPENDENT_AMBULATORY_CARE_PROVIDER_SITE_OTHER): Payer: Medicare Other

## 2020-07-18 ENCOUNTER — Encounter: Payer: Self-pay | Admitting: Family Medicine

## 2020-07-18 VITALS — BP 127/78 | HR 57 | Temp 97.7°F | Resp 17 | Ht 75.0 in | Wt 241.0 lb

## 2020-07-18 DIAGNOSIS — R7303 Prediabetes: Secondary | ICD-10-CM | POA: Diagnosis not present

## 2020-07-18 DIAGNOSIS — Z0000 Encounter for general adult medical examination without abnormal findings: Secondary | ICD-10-CM | POA: Diagnosis not present

## 2020-07-18 DIAGNOSIS — Z125 Encounter for screening for malignant neoplasm of prostate: Secondary | ICD-10-CM | POA: Diagnosis not present

## 2020-07-18 LAB — CBC WITH DIFFERENTIAL/PLATELET
Basophils Absolute: 0.1 10*3/uL (ref 0.0–0.1)
Basophils Relative: 0.8 % (ref 0.0–3.0)
Eosinophils Absolute: 0.3 10*3/uL (ref 0.0–0.7)
Eosinophils Relative: 3.8 % (ref 0.0–5.0)
HCT: 43.8 % (ref 39.0–52.0)
Hemoglobin: 15.2 g/dL (ref 13.0–17.0)
Lymphocytes Relative: 26.7 % (ref 12.0–46.0)
Lymphs Abs: 2.3 10*3/uL (ref 0.7–4.0)
MCHC: 34.8 g/dL (ref 30.0–36.0)
MCV: 90.3 fl (ref 78.0–100.0)
Monocytes Absolute: 0.8 10*3/uL (ref 0.1–1.0)
Monocytes Relative: 9.6 % (ref 3.0–12.0)
Neutro Abs: 5.2 10*3/uL (ref 1.4–7.7)
Neutrophils Relative %: 59.1 % (ref 43.0–77.0)
Platelets: 259 10*3/uL (ref 150.0–400.0)
RBC: 4.85 Mil/uL (ref 4.22–5.81)
RDW: 12.8 % (ref 11.5–15.5)
WBC: 8.7 10*3/uL (ref 4.0–10.5)

## 2020-07-18 LAB — HEMOGLOBIN A1C: Hgb A1c MFr Bld: 7.3 % — ABNORMAL HIGH (ref 4.6–6.5)

## 2020-07-18 LAB — BASIC METABOLIC PANEL
BUN: 14 mg/dL (ref 6–23)
CO2: 24 mEq/L (ref 19–32)
Calcium: 9.1 mg/dL (ref 8.4–10.5)
Chloride: 102 mEq/L (ref 96–112)
Creatinine, Ser: 0.84 mg/dL (ref 0.40–1.50)
GFR: 89.92 mL/min (ref >60.00)
Glucose, Bld: 168 mg/dL — ABNORMAL HIGH (ref 70–99)
Potassium: 4.1 mEq/L (ref 3.5–5.1)
Sodium: 135 mEq/L (ref 135–145)

## 2020-07-18 LAB — HEPATIC FUNCTION PANEL
ALT: 19 U/L (ref 0–53)
AST: 17 U/L (ref 0–37)
Albumin: 4.3 g/dL (ref 3.5–5.2)
Alkaline Phosphatase: 53 U/L (ref 39–117)
Bilirubin, Direct: 0.2 mg/dL (ref 0.0–0.3)
Total Bilirubin: 1.2 mg/dL (ref 0.2–1.2)
Total Protein: 7.4 g/dL (ref 6.0–8.3)

## 2020-07-18 LAB — LIPID PANEL
Cholesterol: 164 mg/dL (ref 0–200)
HDL: 37.7 mg/dL — ABNORMAL LOW (ref >39.00)
NonHDL: 126.79
Total CHOL/HDL Ratio: 4
Triglycerides: 222 mg/dL — ABNORMAL HIGH (ref 0.0–149.0)
VLDL: 44.4 mg/dL — ABNORMAL HIGH (ref 0.0–40.0)

## 2020-07-18 LAB — TSH: TSH: 2.51 u[IU]/mL (ref 0.35–4.50)

## 2020-07-18 LAB — LDL CHOLESTEROL, DIRECT: Direct LDL: 110 mg/dL

## 2020-07-18 LAB — PSA, MEDICARE: PSA: 1.43 ng/ml (ref 0.10–4.00)

## 2020-07-18 NOTE — Patient Instructions (Signed)
Follow up in 6 months to recheck BP and cholesterol Please schedule a lab visit at your convenience (location of your choice) or go to [place] for labs (no appt needed)  We'll notify you of your lab results and make any changes if needed Continue to work on healthy diet and regular exercise- you can do it! Call with any questions or concerns Stay Safe!  Stay Healthy! Happy Holidays!!!

## 2020-07-18 NOTE — Progress Notes (Signed)
   Subjective:    Patient ID: [name] [name], male    DOB: 1951/05/19, 69 y.o.   MRN: [id number]  HPI CPE- UTD on colonoscopy, Tdap, flu, pneumonia vaccines, and COVID.  Reviewed past medical, surgical, family and social histories.   Patient Care Team    Relationship Specialty Notifications Start End  [name], MD PCP - General   07/16/10    Comment: Frederich Cha, Elby Showers, MD Consulting Physician Cardiology  03/14/15   Grace Isaac, MD Consulting Physician Cardiothoracic Surgery  06/03/15   Rana Snare, MD Consulting Physician Urology  06/03/15   Devra Dopp, MD Referring Physician Dermatology  06/10/17   Melissa Montane, MD Consulting Physician Otolaryngology  06/10/17     Health Maintenance  Topic Date Due  . Hepatitis C Screening  07/31/2020 (Originally July 21, 1951)  . COVID-19 Vaccine (4 - Booster for Moderna series) 12/29/2020  . TETANUS/TDAP  07/12/2022  . COLONOSCOPY  05/14/2024  . INFLUENZA VACCINE  Completed  . PNA vac Low Risk Adult  Completed      Review of Systems Patient reports no vision/hearing changes, anorexia, fever ,adenopathy, persistant/recurrent hoarseness, swallowing issues, chest pain, palpitations, edema, persistant/recurrent cough, hemoptysis, dyspnea (rest,exertional, paroxysmal nocturnal), gastrointestinal  bleeding (melena, rectal bleeding), abdominal pain, excessive heart burn, GU symptoms (dysuria, hematuria, voiding/incontinence issues) syncope, focal weakness, memory loss, numbness & tingling, skin/hair/nail changes, depression, anxiety, abnormal bruising/bleeding, musculoskeletal symptoms/signs.   This visit occurred during the SARS-CoV-2 public health emergency.  Safety protocols were in place, including screening questions prior to the visit, additional usage of staff PPE, and extensive cleaning of exam room while observing appropriate contact time as indicated for disinfecting solutions.       Objective:   Physical  Exam General Appearance:    Alert, cooperative, no distress, appears stated age  Head:    Normocephalic, without obvious abnormality, atraumatic  Eyes:    PERRL, conjunctiva/corneas clear, EOM's intact, fundi    benign, both eyes       Ears:    Normal TM's and external ear canals, both ears  Nose:   Deferred due to COVID  Throat:   Neck:   Supple, symmetrical, trachea midline, no adenopathy;       thyroid:  No enlargement/tenderness/nodules  Back:     Symmetric, no curvature, ROM normal, no CVA tenderness  Lungs:     Clear to auscultation bilaterally, respirations unlabored  Chest wall:    No tenderness or deformity  Heart:    Regular rate and rhythm, S1 and S2 normal, + aortic valve click, no rub or gallop  Abdomen:     Soft, non-tender, bowel sounds active all four quadrants,    no masses, no organomegaly  Genitalia:    Deferred   Rectal:    Extremities:   Extremities normal, atraumatic, no cyanosis or edema  Pulses:   2+ and symmetric all extremities  Skin:   Skin color, texture, turgor normal, no rashes or lesions  Lymph nodes:   Cervical, supraclavicular, and axillary nodes normal  Neurologic:   CNII-XII intact. Normal strength, sensation and reflexes      throughout         Assessment & Plan:

## 2020-07-18 NOTE — Assessment & Plan Note (Signed)
Pt has hx of elevated A1C.  UTD on eye exam.  On ARB for renal protection.  Check labs and adjust tx plan prn.

## 2020-07-18 NOTE — Assessment & Plan Note (Signed)
Pt's PE WNL w/ exception of obesity and known valvular click.  UTD on colonoscopy, immunizations.  Check labs.  Anticipatory guidance provided.

## 2020-07-19 ENCOUNTER — Other Ambulatory Visit: Payer: Self-pay

## 2020-07-19 MED ORDER — ROSUVASTATIN CALCIUM 20 MG PO TABS: 20.0000 mg | ORAL_TABLET | Freq: Every day | ORAL | 3 refills | Status: DC

## 2020-07-19 MED ORDER — METFORMIN HCL 500 MG PO TABS: 500.0000 mg | ORAL_TABLET | Freq: Two times a day (BID) | ORAL | 3 refills | Status: DC

## 2020-07-22 ENCOUNTER — Encounter: Payer: Self-pay | Admitting: Family Medicine

## 2020-07-25 ENCOUNTER — Telehealth (HOSPITAL_COMMUNITY): Payer: Self-pay | Admitting: Pharmacy Technician

## 2020-07-25 NOTE — Telephone Encounter (Signed)
Sent in [employer] application via fax.  Will follow up.  

## 2020-08-21 ENCOUNTER — Other Ambulatory Visit: Payer: Self-pay | Admitting: Emergency Medicine

## 2020-08-21 DIAGNOSIS — J302 Other seasonal allergic rhinitis: Secondary | ICD-10-CM

## 2020-08-21 MED ORDER — FLUTICASONE PROPIONATE 50 MCG/ACT NA SUSP: 2.0000 | Freq: Every day | NASAL | 6 refills | Status: DC

## 2020-08-29 ENCOUNTER — Other Ambulatory Visit: Payer: Self-pay

## 2020-08-29 DIAGNOSIS — G540 Brachial plexus disorders: Secondary | ICD-10-CM

## 2020-08-29 MED ORDER — FINASTERIDE 5 MG PO TABS: ORAL_TABLET | ORAL | 1 refills | Status: DC

## 2020-09-10 ENCOUNTER — Other Ambulatory Visit: Payer: Self-pay

## 2020-09-10 ENCOUNTER — Encounter (HOSPITAL_COMMUNITY): Payer: Self-pay | Admitting: Cardiology

## 2020-09-10 ENCOUNTER — Ambulatory Visit (HOSPITAL_COMMUNITY)
Admission: RE | Admit: 2020-09-10 | Discharge: 2020-09-10 | Disposition: A | Discharge status: Home / Self Care | Payer: Medicare Other | Source: Ambulatory Visit | Attending: Cardiology | Admitting: Cardiology

## 2020-09-10 VITALS — BP 130/70 | HR 67 | Wt 242.8 lb

## 2020-09-10 DIAGNOSIS — E119 Type 2 diabetes mellitus without complications: Secondary | ICD-10-CM | POA: Insufficient documentation

## 2020-09-10 DIAGNOSIS — Z953 Presence of xenogenic heart valve: Secondary | ICD-10-CM | POA: Diagnosis not present

## 2020-09-10 DIAGNOSIS — E669 Obesity, unspecified: Secondary | ICD-10-CM

## 2020-09-10 DIAGNOSIS — I712 Thoracic aortic aneurysm, without rupture, unspecified: Secondary | ICD-10-CM

## 2020-09-10 DIAGNOSIS — Z7984 Long term (current) use of oral hypoglycemic drugs: Secondary | ICD-10-CM | POA: Insufficient documentation

## 2020-09-10 DIAGNOSIS — Z7901 Long term (current) use of anticoagulants: Secondary | ICD-10-CM | POA: Insufficient documentation

## 2020-09-10 DIAGNOSIS — I5022 Chronic systolic (congestive) heart failure: Secondary | ICD-10-CM

## 2020-09-10 DIAGNOSIS — I351 Nonrheumatic aortic (valve) insufficiency: Secondary | ICD-10-CM | POA: Diagnosis not present

## 2020-09-10 DIAGNOSIS — R531 Weakness: Secondary | ICD-10-CM | POA: Insufficient documentation

## 2020-09-10 DIAGNOSIS — R0683 Snoring: Secondary | ICD-10-CM

## 2020-09-10 DIAGNOSIS — I110 Hypertensive heart disease with heart failure: Secondary | ICD-10-CM | POA: Insufficient documentation

## 2020-09-10 DIAGNOSIS — Z79899 Other long term (current) drug therapy: Secondary | ICD-10-CM | POA: Diagnosis not present

## 2020-09-10 DIAGNOSIS — M4802 Spinal stenosis, cervical region: Secondary | ICD-10-CM | POA: Diagnosis not present

## 2020-09-10 DIAGNOSIS — I719 Aortic aneurysm of unspecified site, without rupture: Secondary | ICD-10-CM | POA: Insufficient documentation

## 2020-09-10 DIAGNOSIS — E785 Hyperlipidemia, unspecified: Secondary | ICD-10-CM | POA: Diagnosis not present

## 2020-09-10 DIAGNOSIS — Z09 Encounter for follow-up examination after completed treatment for conditions other than malignant neoplasm: Secondary | ICD-10-CM | POA: Diagnosis present

## 2020-09-10 DIAGNOSIS — Z9889 Other specified postprocedural states: Secondary | ICD-10-CM | POA: Insufficient documentation

## 2020-09-10 DIAGNOSIS — I484 Atypical atrial flutter: Secondary | ICD-10-CM

## 2020-09-10 DIAGNOSIS — I4891 Unspecified atrial fibrillation: Secondary | ICD-10-CM | POA: Diagnosis not present

## 2020-09-10 DIAGNOSIS — E7849 Other hyperlipidemia: Secondary | ICD-10-CM

## 2020-09-10 LAB — BASIC METABOLIC PANEL
Anion gap: 9 (ref 5–15)
BUN: 14 mg/dL (ref 8–23)
CO2: 25 mmol/L (ref 22–32)
Calcium: 9.1 mg/dL (ref 8.9–10.3)
Chloride: 105 mmol/L (ref 98–111)
Creatinine, Ser: 0.88 mg/dL (ref 0.61–1.24)
GFR, Estimated: >60 mL/min (ref >60)
Glucose, Bld: 188 mg/dL — ABNORMAL HIGH (ref 70–99)
Potassium: 4.4 mmol/L (ref 3.5–5.1)
Sodium: 139 mmol/L (ref 135–145)

## 2020-09-10 MED ORDER — ROSUVASTATIN CALCIUM 40 MG PO TABS
40.0000 mg | ORAL_TABLET | Freq: Every day | ORAL | 3 refills | Status: DC
Start: 2020-09-10 — End: 2021-09-15

## 2020-09-10 MED ORDER — EMPAGLIFLOZIN 10 MG PO TABS: 10.0000 mg | ORAL_TABLET | Freq: Every day | ORAL | 11 refills | Status: DC

## 2020-09-10 MED ORDER — FUROSEMIDE 20 MG PO TABS
20.0000 mg | ORAL_TABLET | ORAL | 11 refills | Status: DC
Start: 2020-09-10 — End: 2021-11-13

## 2020-09-10 NOTE — Progress Notes (Signed)
Patient Name: Emerson Schreifels        DOB: 08/24/1950      Height: 6'3    Weight:242.8lbs  Office Name: [facility]         Referring Provider: Loralie Champagne, MD  Today's Date: 09/10/2020  Date:  09/10/2020 STOP BANG RISK ASSESSMENT S (snore) Have you been told that you snore?     YES   T (tired) Are you often tired, fatigued, or sleepy during the day?   YES  O (obstruction) Do you stop breathing, choke, or gasp during sleep? YES   P (pressure) Do you have or are you being treated for high blood pressure? YES   B (BMI) Is your body index greater than 35 kg/m? NO   A (age) Are you 31 years old or older? YES   N (neck) Do you have a neck circumference greater than 16 inches?   YES/NO   G (gender) Are you a male? YES   TOTAL STOP/BANG "YES" ANSWERS 6                                                                       For Office Use Only              Procedure Order Form    YES to 3+ Stop Bang questions OR two clinical symptoms - patient qualifies for WatchPAT (CPT 95800)     Submit: This Form + Patient Face Sheet + Clinical Note via CloudPAT or Fax: [phone]         Clinical Notes: Will consult Sleep Specialist and refer for management of therapy due to patient increased risk of Sleep Apnea. Ordering a sleep study due to the following two clinical symptoms:  Loud snoring R06.83 / History of high blood pressure R03.0    I understand that I am proceeding with a home sleep apnea test as ordered by my treating physician. I understand that untreated sleep apnea is a serious cardiovascular risk factor and it is my responsibility to perform the test and seek management for sleep apnea. I will be contacted with the results and be managed for sleep apnea by a local sleep physician. I will be receiving equipment and further instructions from [facility] I shall promptly ship back the equipment via the included mailing label. I understand my insurance will be billed  for the test and as the patient I am responsible for any insurance related out-of-pocket costs incurred. I have been provided with written instructions and can call for additional video or telephonic instruction, with 24-hour availability of qualified personnel to answer any questions: Patient Help Desk [phone].  Patient Signature ______________________________________________________   Date______________________ Patient Telemedicine Verbal Consent

## 2020-09-10 NOTE — Patient Instructions (Addendum)
Labs done today. We will contact you only if your labs are abnormal.  START Jardiance 10mg  (1 tablet) by mouth daily.  STOP taking Metformin  INCREASE Crestor to 40mg  (1 tablet) by mouth daily.  DECREASE Lasix 20mg  (1 tablet) by mouth every other day.  No other medication changes were made. Please continue all current medications as prescribed.  Your physician recommends that you schedule a follow-up appointment in: 10 days & in 2 months for a lab only appointment and for an appointment with Dr. Aundra Dubin in 4 months   Your provider has requested that you have a Chest MRA done. This test has to be approved through your insurance prior to scheduling, once approved we will contact you to schedule an appointment.  Your provider has recommended that you have a home sleep study.  We have provided you with the equipment in our office today. Please download the app and follow the instructions. YOUR PIN NUMBER IS: [id number]. Once you have completed the test you just dispose of the equipment, the information is automatically uploaded to [place] via blue-tooth technology. If your test is positive for sleep apnea and you need a home CPAP machine you will be contacted by Dr Theodosia Blender office [facility]) to set this up.   If you have any questions or concerns before your next appointment please send [place] a message through [place] or call our office at [phone].    TO LEAVE A MESSAGE FOR THE NURSE SELECT OPTION 2, PLEASE LEAVE A MESSAGE INCLUDING: . YOUR NAME . DATE OF BIRTH . CALL BACK NUMBER . REASON FOR CALL**this is important as we prioritize the call backs  YOU WILL RECEIVE A CALL BACK THE SAME DAY AS LONG AS YOU CALL BEFORE 4:00 PM   Do the following things EVERYDAY: 1) Weigh yourself in the morning before breakfast. Write it down and keep it in a log. 2) Take your medicines as prescribed 3) Eat low salt foods--Limit salt (sodium) to 2000 mg per day.  4) Stay as active as you can everyday 5) Limit  all fluids for the day to less than 2 liters   At the [facility], you and your health needs are our priority. As part of our continuing mission to provide you with exceptional heart care, we have created designated Provider Care Teams. These Care Teams include your primary Cardiologist (physician) and Advanced Practice Providers (APPs- Physician Assistants and Nurse Practitioners) who all work together to provide you with the care you need, when you need it.   You may see any of the following providers on your designated Care Team at your next follow up: Marland Kitchen Dr Glori Bickers . Dr Loralie Champagne . Darrick Grinder, NP . Lyda Jester, PA . Audry Riles, PharmD   Please be sure to bring in all your medications bottles to every appointment.

## 2020-09-10 NOTE — Telephone Encounter (Signed)
Advanced Heart Failure Patient Advocate Encounter   Patient was approved to receive Entresto from [employer]  Patient ID: [id number] Effective dates: 08/15/20 through 08/02/21  Charlann Boxer, CPhT

## 2020-09-10 NOTE — Progress Notes (Signed)
[name] [name] has been given Watch Pat Device and instructed on how to complete the home sleep study.  I have submitted his information to Cesar Chavez and she will contact me once prior authorization is approved.

## 2020-09-11 NOTE — Progress Notes (Signed)
Patient ID: [name] [name], male   DOB: [birth year], 70 y.o.   MRN: [id number] PCP: Dr. Birdie Riddle Cardiology: Dr. Aundra Dubin  70 y.o. with history of aortic insufficiency s/p Ross procedure and aortic root aneurysm presents for followup of Ross procedure.  He has a left-sided brachial plexopathy of uncertain etiology and has atrophy of his left shoulder and upper arm.  He developed weakness in his right hand as well as occasional vertigo-type spells.  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    Echo in 8/18 showed no regurgitation or stenosis of autograft in aortic position, bioprosthetic pulmonic valve with mild pulmonic stenosis, mean gradient 10 mmHg.  EF was read as 40-45%, which is lower than in the past.  MRA chest 3/19 showed 5.7 cm dilation of pulmonary autograft at level of sinuses of Valsalva, MRA chest in 6/20 showed 6 cm dilation of the pulmonary autograft at the level of the sinuses of valsalva.  He is being followed for aortic root aneurysm by Dr. Servando Snare.   MRA chest in 6/21 showed aortic root 5.4 cm, no change from prior. Echo in 8/21 showed EF 50%, RV mildly enlarged with mildly decreased systolic function, pulmonary valve autograft in aortic position with mild regurgitation but no stenosis, bioprosthetic pulmonary valve with no significant stenosis or regurgitation, ascending aorta 5.3 cm.   Patient was noted to be in atrial flutter in 8/21, rate 110s.  Looking back, his HR was elevated in the 110s at physician appointments back to 5/21 (no ECGs).  I took him for TEE-guided DCCV in 8/21.  TEE showed EF 40%, mildly decreased RV function, aortic valve replaced by pulmonary autograft with mild AI and no AS, bioprosthetic PV looked  stable.  No LA appendage thrombus, he underwent DCCV.  After DCCV, he developed exertional dyspnea and was started on Lasix.   Echo in 10/21 showed EF improved to 55-60%, mild RV dilation with mildly decreased systolic function, bioprosthetic PV with peak gradient 31 mmHg (mild PS), pulmonary autograft in aortic position with mild regurgitation and no stenosis.   He remains in NSR today.  He has been taking metformin for diabetes but does not like it, feels like it is giving him nightmares.  Weight is up about 4 lbs.  He is feeling good in general.  No significant exertional dyspnea.  No chest pain.  No orthopnea/PND.    ECG (personally reviewed): NSR, 1st degree AVB, RBBB, PACs.   Labs (2/14): K 3.7, creatinine 0.9, LDL 63, HDL 29 Labs (3/14): BNP 36 Labs (2/15): K 3.7, creatinine 0.8, LDL 61, HDL 30 Labs (4/16): K 3.7, creatinine 0.8, LDL 51, HDL 34 Labs (5/16): HCT 44.9 Labs (11/17): LDL 74, HDL 40, K 4.1, creatinine 0.77 Labs (11/18): LDL 75, HDL 35 Labs (2/19): K 4.2, creatinine 0.88 Labs (5/20): LDL 60, K 4.7, creatinine 0.81 Labs (5/21): TSH normal, LDL 71, K 4.7, creatinine 0.78 Labs (8/21): K 4.3, creatinine 0.75 => 1.02 Labs (5/21): LDL 70 Labs (9/21): K 4, creatinine 0.88 Labs (12/21): LDL 110, K 4.1, creatinine 0.84  PMH: 1. Aortic insufficiency: s/p Ross procedure in 1994 in [place].   - Echo (4/11) with hypokinetic basal septum (likely post-surgical), EF 50%, mild LVH, s/p [facility]  procedure with native pulmonic valve in aortic position with mild aortic insufficiency and bioprosthetic pulmonic valve with no pulmonic insufficiency, mild MR, aortic upper normal in size.  - Echo (4/13) with EF 55%, mild LVH, mild AI, mild MR, mild RV dilation, bioprosthetic pulmonic valve with peak pressure 25 mmHg.   - Echo (3/14): Mild LV dilation, mild LVH, EF 60%, pulmonary valve in aortic position (s/p Ross) with mild AI and mildly dilated ascending aorta to 4.3 cm, mildly dilated RV with  normal systolic function, bioprosthetic PVR with mean gradient 23 mmHg.  Echo (3/15) with EF 55-60%, mild LVH, mild AI with no AS, bioprosthetic pulmonic valve with peak gradient 31 mmHg, ascending aorta 4.4 cm.   - Cardiac MRI/MRA chest (5/15) with mild regurgitation of autograft aortic valve and mild regurgitation of homograft pulmonic valve, dilation of aortic root at sinuses of valsalva (4.8 cm), EF 60%, no LGE, mild RV dilation with normal RV systolic function.   - Echo (5/16) with EF 55-60%, mild autograft AI, mild MR, mildly decreased RV systolic function, biatrial enlargement, RVSP 35 mmHg, trivially increased gradient across bioprosthetic pulmonic valve (no progression).  - Echo (8/17): EF 55-60%, pulmonary autograft in aortic valve position, mild AI, bioprosthetic pulmonary valve with mild PS (peak 22 mmHg), moderate RV dilation with normal systolic function.  - Echo (8/18): EF 40-45%, mildly increased gradient across bioprosthetic pulmonary valve (mean 10 mmHg), s/p Ross procedure with no stenosis or significant regurgitation of pulmonary autograft in aortic position.  - Echo (8/21): EF 50%, RV mildly enlarged with mildly decreased systolic function, pulmonary valve autograft in aortic position with mild regurgitation but no stenosis, bioprosthetic pulmonary valve with no significant stenosis or regurgitation, ascending aorta 5.3 cm.  - TEE (8/21): EF 40%, mildly decreased RV function, aortic valve replaced by pulmonary autograft with mild AI and no AS, bioprosthetic PV looked stable. - Echo (10/21): EF improved to 55-60%, mild RV dilation with mildly decreased systolic function, bioprosthetic PV with peak gradient 31 mmHg (mild PS), pulmonary autograft in aortic position with mild regurgitation and not stenosis.  2. Post-operative atrial fibrillation after heart surgery.  3. Normal left heart cath prior to 1994 heart surgery.  4. Left brachial plexopathy with left shoulder muscle atrophy.   Uncertain etiology.  5. BPH 6. Hyperlipidemia.  7. Cervical spinal stenosis with radiculopathy.   8. 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  9. ETT-Sestamibi (3/14): No ischemia or infarction.  10. Carotid dopplers (6/14): mild disease only.  11. Sinus of valsalva aneurysm: MRA chest 2/18 with dilation of pulmonary autograft at level of sinuses of Valsalva to 5.4 cm when measured into left cusp.  - MRA chest (3/19): Saccular dilatation of the left and right sinuses of valsalva, 5.7 cm aortic root diameter.   - MRA chest (6/20): 6 cm dilated sinuses of valsalva, 4.6 cm ascending aorta.  - MRA chest (6/21): aortic root 5.4 cm, no change from prior 12. Elevated left hemidiaphragm.  13. Atrial flutter: Atypical.  Noted in 8/21 => underwent DCCV to NSR. Marland Kitchen   FH: Prostate CA, HTN  SH: Lives in [place], owns a window washing business, married, nonsmoker.   ROS: All systems reviewed and negative except as per HPI.    Current Outpatient Medications  Medication Sig Dispense Refill  . amLODipine (NORVASC) 2.5 MG tablet Take 1 tablet (2.5 mg total) by mouth daily. 30 tablet 11  . apixaban (ELIQUIS)  5 MG TABS tablet Take 1 tablet (5 mg total) by mouth 2 (two) times daily. 60 tablet 11  . cetirizine (ZYRTEC) 10 MG tablet TAKE 1 TABLET(10 MG) BY MOUTH DAILY 90 tablet 1  . empagliflozin (JARDIANCE) 10 MG TABS tablet Take 1 tablet (10 mg total) by mouth daily before breakfast. 30 tablet 11  . finasteride (PROSCAR) 5 MG tablet TAKE 1 TABLET(5 MG) BY MOUTH DAILY 90 tablet 1  . fluticasone (FLONASE) 50 MCG/ACT nasal spray Place 2 sprays into both nostrils daily. 16 g 6  . metoprolol succinate (TOPROL-XL) 100 MG 24 hr tablet Take 1 tablet (100 mg total) by mouth 2 (two) times daily. 180 tablet 3  . omeprazole (PRILOSEC) 40 MG capsule TAKE 1 CAPSULE(40 MG) BY MOUTH DAILY 90 capsule 1  . sacubitril-valsartan (ENTRESTO) 97-103 MG Take 1  tablet by mouth 2 (two) times daily. 180 tablet 3  . spironolactone (ALDACTONE) 25 MG tablet Take 0.5 tablets (12.5 mg total) by mouth daily. 15 tablet 6  . Wheat Dextrin (BENEFIBER) POWD Take 2 Scoops by mouth daily.    . furosemide (LASIX) 20 MG tablet Take 1 tablet (20 mg total) by mouth every other day. 15 tablet 11  . rosuvastatin (CRESTOR) 40 MG tablet Take 1 tablet (40 mg total) by mouth daily. 90 tablet 3   No current facility-administered medications for this encounter.    BP 130/70   Pulse 67   Wt 110.1 kg (242 lb 12.8 oz)   SpO2 98%   BMI 30.35 kg/m  General: NAD Neck: No JVD, no thyromegaly or thyroid nodule.  Lungs: Clear to auscultation bilaterally with normal respiratory effort. CV: Nondisplaced PMI.  Heart regular S1/S2, no S3/S4, 2/6 SEM with widely split S2.  No peripheral edema.  No carotid bruit.  Normal pedal pulses.  Abdomen: Soft, nontender, no hepatosplenomegaly, no distention.  Skin: Intact without lesions or rashes.  Neurologic: Alert and oriented x 3.  Psych: Normal affect. Extremities: No clubbing or cyanosis.  HEENT: Normal.  Assessment/Plan: 1. Atrial flutter:  Likely related to prior cardiac surgery.  He had peri-operative AF at time of heart surgery but had not been noted to have recurrent arrhythmias until 8/21.  In 8/21, he was noted to be in atrial flutter with RVR, based on review of HR over the past few months, he had probably been in atrial flutter since at least 5/21. He had DCCV in 8/21 and is in NSR today. He saw Dr. Rayann Heman, ablation offered if flutter recurs.  - Continue Eliquis 5 mg bid.  - Continue Toprol XL 100 mg bid.  2.  History of Ross Procedure: Echo in 10/21 showed pulmonary autograft in aortic position with mild AI, no AS.  The bioprosthetic pulmonary valve had a mean gradient of 31 mmHg, suggesting mild stenosis.   - Needs endocarditis prophylaxis with dental work.   3.  Aortic root aneurym: This not uncommonly accompanies Ross  procedure.  The pulmonary autograft at sinuses of valsalva measured 5.4 cm by MRA chest in 6/21.  This was reviewed by Dr. Servando Snare and thought to have been stable over the last few years, continued observation.   - Per Dr. Servando Snare, would hold off on elective repair unless there is significant AI.   - Continue blood pressure control, goal SBP 120s or lower.  - Repeat MRA chest in 6/22 (ordered).  4. HTN: Given aneurysm, need to make sure that BP is well-controlled.  - Continue amlodipine, Entresto, spironolactone, Toprol XL.  5. Hyperlipidemia: Increase Crestor to 40 mg daily, check lipids/LFTs in 2 months.  6. Chronic systolic CHF: TEE in 0/51 in setting of long-standing atrial flutter with RVR showed EF down to 40%. 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  He was cardioverted in 8/21 but developed exertional dyspnea with volume overload afterwards.  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  He is not volume overloaded on exam, NYHA class I-II symptoms now.     - Start Jardiance 10 mg daily (see below) and decrease Lasix to 20 mg every other day.  BMET today and in 10 days.  - Continue Toprol XL.  - Continue Entresto 97/103 bid.  - Continue spironolactone 12.5 mg daily. 7. OSA: Suspected based on daytime sleepiness and snoring.   - Arrange for home sleep study.  8. Diabetes: Not tolerating metformin well.  - Stop Metformin and start Jardiance 10 mg daily.   Followup in 4 months.   Loralie Champagne 09/11/2020

## 2020-09-12 ENCOUNTER — Telehealth (HOSPITAL_COMMUNITY): Payer: Self-pay | Admitting: Surgery

## 2020-09-12 ENCOUNTER — Encounter (INDEPENDENT_AMBULATORY_CARE_PROVIDER_SITE_OTHER): Payer: Medicare Other | Admitting: Cardiology

## 2020-09-12 DIAGNOSIS — G4733 Obstructive sleep apnea (adult) (pediatric): Secondary | ICD-10-CM | POA: Diagnosis not present

## 2020-09-12 NOTE — Telephone Encounter (Signed)
I called Mr. Booz to let him know it was fine to proceed with the home sleep study.  I left a brief message for a return call if there were any questions.

## 2020-09-18 ENCOUNTER — Ambulatory Visit: Payer: Medicare Other

## 2020-09-18 DIAGNOSIS — E669 Obesity, unspecified: Secondary | ICD-10-CM

## 2020-09-18 DIAGNOSIS — R0683 Snoring: Secondary | ICD-10-CM

## 2020-09-18 NOTE — Procedures (Signed)
   Sleep Study Report  Patient Information Name: [name] [name]: [id number] Birth Date: [birth year]  Age: 70  Gender:Male Study Date:09/12/2020 Referring Physician : Loralie Champagne, MD  TEST DESCRIPTION: Home sleep apnea testing was completed using the WatchPat, a Type 1 device, utilizing peripheral arterial tonometry (PAT), chest movement, actigraphy, pulse oximetry, pulse rate, body position and snore. AHI was calculated with apnea and hypopnea using valid sleep time as the denominator. RDI includes apneas, hypopneas, and RERAs. The data acquired and the scoring of sleep and all associated events were performed in accordance with the recommended standards and specifications as outlined in the AASM Manual for the Scoring of Sleep and Associated Events 2.2.0 (2015).  FINDINGS: 1. Severe Obstructive Sleep Apnea with AHI 35.2/hr.  2. No significant Central Sleep Apnea with pAHIc 2/hr. 3. Oxygen desaturations as low as 85%. 4. Mild to moderate snoring was present. O2 sats were < 88% for 4 min. 5. Total sleep time was 8 hrs and 35 min. 6. 25% of total sleep time was spent in REM sleep.  7. Normal sleep onset latency at 20 min.  8. Shortented REM sleep onset latency at 44 min.  9. Total awakenings were 10.   DIAGNOSIS:  Severe Obstructive Sleep Apnea (G47.33)  RECOMMENDATIONS:  1. Clinical correlation of these findings is necessary. The decision to treat obstructive sleep apnea (OSA) is usually based on the presence of apnea symptoms or the presence of associated medical conditions such as Hypertension, Congestive Heart Failure, Atrial Fibrillation or Obesity. The most common symptoms of OSA are snoring, gasping for breath while sleeping, daytime sleepiness and fatigue.   2. Initiating apnea therapy is recommended given the presence of symptoms and/or associated conditions.   Recommend proceeding with one of the following:  a. Auto-CPAP therapy with a pressure range of 5-20cm H2O.    b. An oral appliance (OA) that can be obtained from certain dentists with expertise in sleep medicine. These are primarily of use in non-obese patients with mild and moderate disease.   c. An ENT consultation which may be useful to look for specific causes of obstruction and possible treatment options.   d. If patient is intolerant to PAP therapy, consider referral to ENT for evaluation for hypoglossal nerve stimulator.   3. Close follow-up is necessary to ensure success with CPAP or oral appliance therapy for maximum benefit .  4. A follow-up oximetry study on CPAP is recommended to assess the adequacy of therapy and determine the need for supplemental oxygen or the potential need for Bi-level therapy. An arterial blood gas to determine the adequacy of baseline ventilation and oxygenation should also be considered.  5. Healthy sleep recommendations include: adequate nightly sleep (normal 7-9 hrs/night), avoidance of caffeine after noon and alcohol near bedtime, and maintaining a sleep environment that is cool, dark and quiet.  6. Weight loss for overweight patients is recommended. Even modest amounts of weight loss can significantly improve the severity of sleep apnea.  7. Snoring recommendations include: weight loss where appropriate, side sleeping, and avoidance of alcohol before bed.  8. Operation of motor vehicle or dangerous equipment must be avoided when feeling drowsy, excessively sleepy, or mentally fatigued.  Report prepared by:  Signature: Fransico Him , MD, [facility], [place] Board of Sleep Medicine   Electronically Signed: Feb 16, 202

## 2020-09-20 ENCOUNTER — Other Ambulatory Visit: Payer: Self-pay

## 2020-09-20 ENCOUNTER — Ambulatory Visit (HOSPITAL_COMMUNITY)
Admission: RE | Admit: 2020-09-20 | Discharge: 2020-09-20 | Disposition: A | Discharge status: Home / Self Care | Payer: Medicare Other | Source: Ambulatory Visit | Attending: Cardiology | Admitting: Cardiology

## 2020-09-20 DIAGNOSIS — I5022 Chronic systolic (congestive) heart failure: Secondary | ICD-10-CM | POA: Insufficient documentation

## 2020-09-20 LAB — BASIC METABOLIC PANEL
Anion gap: 8 (ref 5–15)
BUN: 14 mg/dL (ref 8–23)
CO2: 24 mmol/L (ref 22–32)
Calcium: 9.3 mg/dL (ref 8.9–10.3)
Chloride: 105 mmol/L (ref 98–111)
Creatinine, Ser: 0.9 mg/dL (ref 0.61–1.24)
GFR, Estimated: >60 mL/min (ref >60)
Glucose, Bld: 138 mg/dL — ABNORMAL HIGH (ref 70–99)
Potassium: 4.8 mmol/L (ref 3.5–5.1)
Sodium: 137 mmol/L (ref 135–145)

## 2020-09-30 ENCOUNTER — Other Ambulatory Visit (HOSPITAL_COMMUNITY): Payer: Self-pay | Admitting: *Deleted

## 2020-09-30 MED ORDER — SPIRONOLACTONE 25 MG PO TABS
12.5000 mg | ORAL_TABLET | Freq: Every day | ORAL | 6 refills | Status: DC
Start: 2020-09-30 — End: 2021-04-01

## 2020-10-04 ENCOUNTER — Telehealth: Payer: Self-pay | Admitting: *Deleted

## 2020-10-04 DIAGNOSIS — G4733 Obstructive sleep apnea (adult) (pediatric): Secondary | ICD-10-CM

## 2020-10-04 NOTE — Telephone Encounter (Signed)
Informed patient of sleep study results and patient understanding was verbalized. Patient understands his sleep study showed they have sleep apnea. Order ResMed auto CPAP from 4 to 20cm H2O with heated humidity and mask of choice. Followup with me in 6 weeks.  Upon patient request DME selection is CHOICE. Patient understands HE will be contacted by [facility] to set up HIS cpap. Patient understands to call if [facility] does not contact HIM with new setup in a timely manner. Patient understands they will be called once confirmation has been received from [facility] that they have received their new machine to schedule 10 week follow up appointment.  [facility] notified of new cpap order  Please add to airview  Patient has refused his cpap machine and states he has lived for 70 years without it and he does not want it.

## 2020-10-04 NOTE — Telephone Encounter (Signed)
-----   Message from Sueanne Margarita, MD sent at 09/18/2020  7:25 PM EST ----- Please let patient know that they have sleep apnea.  Order ResMed auto CPAP from 4 to 20cm H2O with heated humidity and mask of choice. Followup with me in 6 weeks.

## 2020-10-07 ENCOUNTER — Other Ambulatory Visit: Payer: Self-pay | Admitting: Emergency Medicine

## 2020-10-07 DIAGNOSIS — K219 Gastro-esophageal reflux disease without esophagitis: Secondary | ICD-10-CM

## 2020-10-07 MED ORDER — OMEPRAZOLE 40 MG PO CPDR: DELAYED_RELEASE_CAPSULE | ORAL | 1 refills | Status: DC

## 2020-10-28 ENCOUNTER — Other Ambulatory Visit: Payer: Self-pay

## 2020-10-28 DIAGNOSIS — B9789 Other viral agents as the cause of diseases classified elsewhere: Secondary | ICD-10-CM

## 2020-10-28 DIAGNOSIS — J329 Chronic sinusitis, unspecified: Secondary | ICD-10-CM

## 2020-10-28 MED ORDER — CETIRIZINE HCL 10 MG PO TABS: ORAL_TABLET | ORAL | 0 refills | Status: DC

## 2020-10-30 ENCOUNTER — Telehealth (HOSPITAL_COMMUNITY): Payer: Self-pay | Admitting: Pharmacy Technician

## 2020-10-30 NOTE — Telephone Encounter (Signed)
Sent in [facility] application via fax.  Will follow up.

## 2020-11-01 NOTE — Telephone Encounter (Signed)
Advanced Heart Failure Patient Advocate Encounter   Patient was approved to receive Eliquis from [employer]  Patient ID: [id number] Effective dates: 11/01/20 through 08/02/21  Emailed patient the approval information.  Charlann Boxer, CPhT

## 2020-11-08 ENCOUNTER — Ambulatory Visit (HOSPITAL_COMMUNITY)
Admission: RE | Admit: 2020-11-08 | Discharge: 2020-11-08 | Disposition: A | Discharge status: Home / Self Care | Payer: Medicare Other | Source: Ambulatory Visit | Attending: Cardiology | Admitting: Cardiology

## 2020-11-08 ENCOUNTER — Other Ambulatory Visit: Payer: Self-pay

## 2020-11-08 DIAGNOSIS — E7849 Other hyperlipidemia: Secondary | ICD-10-CM | POA: Diagnosis present

## 2020-11-08 LAB — LIPID PANEL
Cholesterol: 129 mg/dL (ref 0–200)
HDL: 41 mg/dL (ref >40)
LDL Cholesterol: 70 mg/dL (ref 0–99)
Total CHOL/HDL Ratio: 3.1 RATIO
Triglycerides: 88 mg/dL (ref <150)
VLDL: 18 mg/dL (ref 0–40)

## 2020-11-08 LAB — HEPATIC FUNCTION PANEL
ALT: 23 U/L (ref 0–44)
AST: 22 U/L (ref 15–41)
Albumin: 4 g/dL (ref 3.5–5.0)
Alkaline Phosphatase: 50 U/L (ref 38–126)
Bilirubin, Direct: 0.2 mg/dL (ref 0.0–0.2)
Indirect Bilirubin: 1.3 mg/dL — ABNORMAL HIGH (ref 0.3–0.9)
Total Bilirubin: 1.5 mg/dL — ABNORMAL HIGH (ref 0.3–1.2)
Total Protein: 6.8 g/dL (ref 6.5–8.1)

## 2021-01-08 ENCOUNTER — Other Ambulatory Visit: Payer: Self-pay

## 2021-01-08 ENCOUNTER — Encounter (HOSPITAL_COMMUNITY): Payer: Self-pay | Admitting: Cardiology

## 2021-01-08 ENCOUNTER — Ambulatory Visit (HOSPITAL_COMMUNITY)
Admission: RE | Admit: 2021-01-08 | Discharge: 2021-01-08 | Disposition: A | Discharge status: Home / Self Care | Payer: Medicare Other | Source: Ambulatory Visit | Attending: Cardiology | Admitting: Cardiology

## 2021-01-08 VITALS — BP 118/68 | HR 54 | Wt 237.2 lb

## 2021-01-08 DIAGNOSIS — Z79899 Other long term (current) drug therapy: Secondary | ICD-10-CM | POA: Insufficient documentation

## 2021-01-08 DIAGNOSIS — E119 Type 2 diabetes mellitus without complications: Secondary | ICD-10-CM | POA: Insufficient documentation

## 2021-01-08 DIAGNOSIS — I712 Thoracic aortic aneurysm, without rupture, unspecified: Secondary | ICD-10-CM

## 2021-01-08 DIAGNOSIS — I4892 Unspecified atrial flutter: Secondary | ICD-10-CM | POA: Diagnosis not present

## 2021-01-08 DIAGNOSIS — I110 Hypertensive heart disease with heart failure: Secondary | ICD-10-CM | POA: Insufficient documentation

## 2021-01-08 DIAGNOSIS — I509 Heart failure, unspecified: Secondary | ICD-10-CM | POA: Diagnosis not present

## 2021-01-08 DIAGNOSIS — I484 Atypical atrial flutter: Secondary | ICD-10-CM

## 2021-01-08 DIAGNOSIS — Z8249 Family history of ischemic heart disease and other diseases of the circulatory system: Secondary | ICD-10-CM | POA: Insufficient documentation

## 2021-01-08 DIAGNOSIS — I4510 Unspecified right bundle-branch block: Secondary | ICD-10-CM | POA: Diagnosis not present

## 2021-01-08 DIAGNOSIS — E785 Hyperlipidemia, unspecified: Secondary | ICD-10-CM | POA: Insufficient documentation

## 2021-01-08 DIAGNOSIS — I5022 Chronic systolic (congestive) heart failure: Secondary | ICD-10-CM | POA: Diagnosis not present

## 2021-01-08 DIAGNOSIS — Z7901 Long term (current) use of anticoagulants: Secondary | ICD-10-CM | POA: Diagnosis not present

## 2021-01-08 DIAGNOSIS — Z7984 Long term (current) use of oral hypoglycemic drugs: Secondary | ICD-10-CM | POA: Diagnosis not present

## 2021-01-08 HISTORY — DX: Heart failure, unspecified: I50.9

## 2021-01-08 LAB — BASIC METABOLIC PANEL
Anion gap: 9 (ref 5–15)
BUN: 16 mg/dL (ref 8–23)
CO2: 21 mmol/L — ABNORMAL LOW (ref 22–32)
Calcium: 9 mg/dL (ref 8.9–10.3)
Chloride: 105 mmol/L (ref 98–111)
Creatinine, Ser: 0.77 mg/dL (ref 0.61–1.24)
GFR, Estimated: >60 mL/min (ref >60)
Glucose, Bld: 162 mg/dL — ABNORMAL HIGH (ref 70–99)
Potassium: 4.4 mmol/L (ref 3.5–5.1)
Sodium: 135 mmol/L (ref 135–145)

## 2021-01-08 NOTE — Patient Instructions (Addendum)
EKG done today.  Labs done today. We will contact you only if your labs are abnormal.  No medication changes were made. Please continue all current medications as prescribed.  Your provider has requested that you have a Chest MRA done. This has to be approved through your insurance company prior to scheduling, once approved we will contact you to schedule an appointment.   You have been referred to back to [facility]. They will contact you to schedule an appointment.   Your physician recommends that you schedule a follow-up appointment in: October for an appointment with Dr. Aundra Dubin with an echo prior to your exam. Please contact our office in September to schedule your appointment.   Your physician has requested that you have an echocardiogram. Echocardiography is a painless test that uses sound waves to create images of your heart. It provides your doctor with information about the size and shape of your heart and how well your heart's chambers and valves are working. This procedure takes approximately one hour. There are no restrictions for this procedure.   If you have any questions or concerns before your next appointment please send [place] a message through [place] or call our office at [phone].    TO LEAVE A MESSAGE FOR THE NURSE SELECT OPTION 2, PLEASE LEAVE A MESSAGE INCLUDING: . YOUR NAME . DATE OF BIRTH . CALL BACK NUMBER . REASON FOR CALL**this is important as we prioritize the call backs  YOU WILL RECEIVE A CALL BACK THE SAME DAY AS LONG AS YOU CALL BEFORE 4:00 PM   Do the following things EVERYDAY: 1) Weigh yourself in the morning before breakfast. Write it down and keep it in a log. 2) Take your medicines as prescribed 3) Eat low salt foods--Limit salt (sodium) to 2000 mg per day.  4) Stay as active as you can everyday 5) Limit all fluids for the day to less than 2 liters   At the [facility], you and your health needs are our priority. As part of our  continuing mission to provide you with exceptional heart care, we have created designated Provider Care Teams. These Care Teams include your primary Cardiologist (physician) and Advanced Practice Providers (APPs- Physician Assistants and Nurse Practitioners) who all work together to provide you with the care you need, when you need it.   You may see any of the following providers on your designated Care Team at your next follow up: Marland Kitchen Dr Glori Bickers . Dr Loralie Champagne . Darrick Grinder, NP . Lyda Jester, PA . Audry Riles, PharmD   Please be sure to bring in all your medications bottles to every appointment.

## 2021-01-09 NOTE — Progress Notes (Signed)
Patient ID: [name] [name], male   DOB: 1951/06/25, 70 y.o.   MRN: [id number] PCP: Dr. Birdie Riddle Cardiology: Dr. Aundra Dubin  70 y.o. with history of aortic insufficiency s/p Ross procedure and aortic root aneurysm presents for followup of Ross procedure.  He has a left-sided brachial plexopathy of uncertain etiology and has atrophy of his left shoulder and upper arm.  He developed weakness in his right hand as well as occasional vertigo-type spells.  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    Echo in 8/18 showed no regurgitation or stenosis of autograft in aortic position, bioprosthetic pulmonic valve with mild pulmonic stenosis, mean gradient 10 mmHg.  EF was read as 40-45%, which is lower than in the past.  MRA chest 3/19 showed 5.7 cm dilation of pulmonary autograft at level of sinuses of Valsalva, MRA chest in 6/20 showed 6 cm dilation of the pulmonary autograft at the level of the sinuses of valsalva.  He is being followed for aortic root aneurysm by Dr. Servando Snare.   MRA chest in 6/21 showed aortic root 5.4 cm, no change from prior. Echo in 8/21 showed EF 50%, RV mildly enlarged with mildly decreased systolic function, pulmonary valve autograft in aortic position with mild regurgitation but no stenosis, bioprosthetic pulmonary valve with no significant stenosis or regurgitation, ascending aorta 5.3 cm.   Patient was noted to be in atrial flutter in 8/21, rate 110s.  Looking back, his HR was elevated in the 110s at physician appointments back to 69/21 (no ECGs).  I took him for TEE-guided DCCV in 8/21.  TEE showed EF 40%, mildly decreased RV function, aortic valve replaced by pulmonary autograft with mild AI and no AS, bioprosthetic PV looked  stable.  No LA appendage thrombus, he underwent DCCV.  After DCCV, he developed exertional dyspnea and was started on Lasix.   Echo in 10/21 showed EF improved to 55-60%, mild RV dilation with mildly decreased systolic function, bioprosthetic PV with peak gradient 31 mmHg (mild PS), pulmonary autograft in aortic position with mild regurgitation and no stenosis.  Sleep study showed severe OSA but he does not want to use CPAP.   He remains in NSR today.  Weight is down 5 lbs.  He remains in NSR, no palpitations.  BP controlled when he checks at home.  Occasionally fatigued and sleepy during the day.  No chest pain.  No exertional dyspnea.  Still working (window washing business).  No lightheadedness.    ECG (personally reviewed): NSR, 1st degree AVB, RBBB.   Labs (2/14): K 3.7, creatinine 0.9, LDL 63, HDL 29 Labs (3/14): BNP 36 Labs (2/15): K 3.7, creatinine 0.8, LDL 61, HDL 30 Labs (4/16): K 3.7, creatinine 0.8, LDL 51, HDL 34 Labs (5/16): HCT 44.9 Labs (11/17): LDL 74, HDL 40, K 4.1, creatinine 0.77 Labs (11/18): LDL 75, HDL 35 Labs (2/19): K 4.2, creatinine 0.88 Labs (5/20): LDL 60, K 4.7, creatinine 0.81 Labs (5/21): TSH normal, LDL 71, K 4.7, creatinine 0.78 Labs (8/21): K 4.3, creatinine 0.75 => 1.02 Labs (5/21): LDL 70 Labs (9/21): K 4, creatinine 0.88 Labs (12/21): LDL 110, K 4.1, creatinine 0.84 Labs (2/22): K 4.8, creatinine 0.9 Labs (4/22): LDL 70, HDL 41  PMH: 1. Aortic insufficiency:  s/p Ross procedure in 1994 in [place].   - Echo (4/11) with hypokinetic basal septum (likely post-surgical), EF 50%, mild LVH, s/p Ross procedure with native pulmonic valve in aortic position with mild aortic insufficiency and bioprosthetic pulmonic valve with no pulmonic insufficiency, mild MR, aortic upper normal in size.  - Echo (4/13) with EF 55%, mild LVH, mild AI, mild MR, mild RV dilation, bioprosthetic pulmonic valve with peak pressure 25 mmHg.   - Echo (3/14): Mild LV dilation, mild  LVH, EF 60%, pulmonary valve in aortic position (s/p Ross) with mild AI and mildly dilated ascending aorta to 4.3 cm, mildly dilated RV with normal systolic function, bioprosthetic PVR with mean gradient 23 mmHg.  Echo (3/15) with EF 55-60%, mild LVH, mild AI with no AS, bioprosthetic pulmonic valve with peak gradient 31 mmHg, ascending aorta 4.4 cm.   - Cardiac MRI/MRA chest (5/15) with mild regurgitation of autograft aortic valve and mild regurgitation of homograft pulmonic valve, dilation of aortic root at sinuses of valsalva (4.8 cm), EF 60%, no LGE, mild RV dilation with normal RV systolic function.   - Echo (5/16) with EF 55-60%, mild autograft AI, mild MR, mildly decreased RV systolic function, biatrial enlargement, RVSP 35 mmHg, trivially increased gradient across bioprosthetic pulmonic valve (no progression).  - Echo (8/17): EF 55-60%, pulmonary autograft in aortic valve position, mild AI, bioprosthetic pulmonary valve with mild PS (peak 22 mmHg), moderate RV dilation with normal systolic function.  - Echo (8/18): EF 40-45%, mildly increased gradient across bioprosthetic pulmonary valve (mean 10 mmHg), s/p Ross procedure with no stenosis or significant regurgitation of pulmonary autograft in aortic position.  - Echo (8/21): EF 50%, RV mildly enlarged with mildly decreased systolic function, pulmonary valve autograft in aortic position with mild regurgitation but no stenosis, bioprosthetic pulmonary valve with no significant stenosis or regurgitation, ascending aorta 5.3 cm.  - TEE (8/21): EF 40%, mildly decreased RV function, aortic valve replaced by pulmonary autograft with mild AI and no AS, bioprosthetic PV looked stable. - Echo (10/21): EF improved to 55-60%, mild RV dilation with mildly decreased systolic function, bioprosthetic PV with peak gradient 31 mmHg (mild PS), pulmonary autograft in aortic position with mild regurgitation and not stenosis.  2. Post-operative atrial fibrillation after  heart surgery.  3. Normal left heart cath prior to 1994 heart surgery.  4. Left brachial plexopathy with left shoulder muscle atrophy.  Uncertain etiology.  5. BPH 6. Hyperlipidemia.  7. Cervical spinal stenosis with radiculopathy.   8. 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  9. ETT-Sestamibi (3/14): No ischemia or infarction.  10. Carotid dopplers (6/14): mild disease only.  11. Sinus of valsalva aneurysm: MRA chest 2/18 with dilation of pulmonary autograft at level of sinuses of Valsalva to 5.4 cm when measured into left cusp.  - MRA chest (3/19): Saccular dilatation of the left and right sinuses of valsalva, 5.7 cm aortic root diameter.   - MRA chest (6/20): 6 cm dilated sinuses of valsalva, 4.6 cm ascending aorta.  - MRA chest (6/21): aortic root 5.4 cm, no change from prior 12. Elevated left hemidiaphragm.  13. Atrial flutter: Atypical.  Noted in 8/21 => underwent DCCV to NSR. Marland Kitchen   FH: Prostate CA, HTN  SH: Lives in [place], owns a window washing business, married, nonsmoker.   ROS: All systems reviewed and negative except as per HPI.    Current Outpatient Medications  Medication Sig  Dispense Refill   amLODipine (NORVASC) 2.5 MG tablet Take 1 tablet (2.5 mg total) by mouth daily. 30 tablet 11   apixaban (ELIQUIS) 5 MG TABS tablet Take 1 tablet (5 mg total) by mouth 2 (two) times daily. 60 tablet 11   cetirizine (ZYRTEC) 10 MG tablet TAKE 1 TABLET(10 MG) BY MOUTH DAILY 90 tablet 0   finasteride (PROSCAR) 5 MG tablet TAKE 1 TABLET(5 MG) BY MOUTH DAILY 90 tablet 1   fluticasone (FLONASE) 50 MCG/ACT nasal spray Place 2 sprays into both nostrils daily. 16 g 6   furosemide (LASIX) 20 MG tablet Take 1 tablet (20 mg total) by mouth every other day. 15 tablet 11   METFORMIN HCL PO Take 500 mg by mouth daily.     metoprolol succinate (TOPROL-XL) 100 MG 24 hr tablet Take 1 tablet (100 mg total) by mouth 2 (two) times  daily. 180 tablet 3   omeprazole (PRILOSEC) 40 MG capsule TAKE 1 CAPSULE(40 MG) BY MOUTH DAILY 90 capsule 1   rosuvastatin (CRESTOR) 40 MG tablet Take 1 tablet (40 mg total) by mouth daily. 90 tablet 3   sacubitril-valsartan (ENTRESTO) 97-103 MG Take 1 tablet by mouth 2 (two) times daily. 180 tablet 3   spironolactone (ALDACTONE) 25 MG tablet Take 0.5 tablets (12.5 mg total) by mouth daily. 15 tablet 6   No current facility-administered medications for this encounter.    BP 118/68   Pulse (!) 54   Wt 107.6 kg (237 lb 3.2 oz)   SpO2 97%   BMI 29.65 kg/m  General: NAD Neck: No JVD, no thyromegaly or thyroid nodule.  Lungs: Clear to auscultation bilaterally with normal respiratory effort. CV: Nondisplaced PMI.  Heart regular S1/S2 with widely split S2, no S3/S4, 2/6 early SEM sternal border.  No peripheral edema.  No carotid bruit.  Normal pedal pulses.  Abdomen: Soft, nontender, no hepatosplenomegaly, no distention.  Skin: Intact without lesions or rashes.  Neurologic: Alert and oriented x 3.  Psych: Normal affect. Extremities: No clubbing or cyanosis.  HEENT: Normal.   Assessment/Plan: 1. Atrial flutter:  Likely related to prior cardiac surgery.  He had peri-operative AF at time of heart surgery but had not been noted to have recurrent arrhythmias until 8/21.  In 8/21, he was noted to be in atrial flutter with RVR, based on review of HR over the past few months, he had probably been in atrial flutter since at least 5/21. He had DCCV in 8/21 and is in NSR today. He saw Dr. Rayann Heman, ablation offered if flutter recurs.  - Continue Eliquis 5 mg bid.  - Continue Toprol XL 100 mg bid.  2.  History of Ross Procedure: Echo in 10/21 showed pulmonary autograft in aortic position with mild AI, no AS.  The bioprosthetic pulmonary valve had a mean gradient of 31 mmHg, suggesting mild stenosis.   - Needs endocarditis prophylaxis with dental work.   - Repeat echo in 10/22.  3.  Aortic root aneurym:  This not uncommonly accompanies Ross procedure.  The pulmonary autograft at sinuses of valsalva measured 5.4 cm by MRA chest in 6/21.  This was reviewed by Dr. Servando Snare and thought to have been stable over the last few years, continued observation.   - Per Dr. Servando Snare, would hold off on elective repair unless there is significant AI.   - Continue blood pressure control, goal SBP 120s or lower.  - Due for repeat MRA chest (ordered).  - Arrange for TCTS followup now that Dr.  Servando Snare has retired.   4. HTN: Given aneurysm, need to make sure that BP is well-controlled.  - Continue amlodipine, Entresto, spironolactone, Toprol XL.   5. Hyperlipidemia: Continue current Crestor, good lipids in 4/22.  6. Chronic systolic CHF: TEE in 5/91 in setting of long-standing atrial flutter with RVR showed EF down to 40%. 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  He was cardioverted in 8/21 but developed exertional dyspnea with volume overload afterwards.  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  He is not volume overloaded on exam, NYHA class I-II symptoms now.     - Continue Lasix 20 mg qod.  - Continue Toprol XL.  - Continue Entresto 97/103 bid.  - Continue spironolactone 12.5 mg daily. 7. OSA: Severe by sleep study.  Refuses CPAP.  I suspect this is a major contributor to his daytime sleepiness and fatigue.  8. Diabetes: on metformin.   Followup in 10/22 with echo.    Loralie Champagne 01/09/2021

## 2021-01-13 ENCOUNTER — Other Ambulatory Visit: Payer: Self-pay | Admitting: *Deleted

## 2021-01-13 DIAGNOSIS — I712 Thoracic aortic aneurysm, without rupture, unspecified: Secondary | ICD-10-CM

## 2021-01-17 ENCOUNTER — Telehealth (HOSPITAL_COMMUNITY): Payer: Self-pay | Admitting: *Deleted

## 2021-01-20 ENCOUNTER — Other Ambulatory Visit: Payer: Self-pay

## 2021-01-20 ENCOUNTER — Ambulatory Visit: Payer: Medicare Other | Admitting: Family Medicine

## 2021-01-20 ENCOUNTER — Encounter: Payer: Self-pay | Admitting: Family Medicine

## 2021-01-20 VITALS — BP 120/78 | HR 51 | Temp 97.5°F | Resp 20 | Ht 75.0 in | Wt 238.6 lb

## 2021-01-20 DIAGNOSIS — I10 Essential (primary) hypertension: Secondary | ICD-10-CM

## 2021-01-20 DIAGNOSIS — Z1159 Encounter for screening for other viral diseases: Secondary | ICD-10-CM | POA: Diagnosis not present

## 2021-01-20 DIAGNOSIS — E118 Type 2 diabetes mellitus with unspecified complications: Secondary | ICD-10-CM

## 2021-01-20 DIAGNOSIS — M25511 Pain in right shoulder: Secondary | ICD-10-CM

## 2021-01-20 DIAGNOSIS — E785 Hyperlipidemia, unspecified: Secondary | ICD-10-CM

## 2021-01-20 LAB — CBC WITH DIFFERENTIAL/PLATELET
Basophils Absolute: 0 10*3/uL (ref 0.0–0.1)
Basophils Relative: 0.3 % (ref 0.0–3.0)
Eosinophils Absolute: 0.2 10*3/uL (ref 0.0–0.7)
Eosinophils Relative: 2.6 % (ref 0.0–5.0)
HCT: 43.5 % (ref 39.0–52.0)
Hemoglobin: 14.6 g/dL (ref 13.0–17.0)
Lymphocytes Relative: 28.2 % (ref 12.0–46.0)
Lymphs Abs: 1.9 10*3/uL (ref 0.7–4.0)
MCHC: 33.7 g/dL (ref 30.0–36.0)
MCV: 90.2 fl (ref 78.0–100.0)
Monocytes Absolute: 0.8 10*3/uL (ref 0.1–1.0)
Monocytes Relative: 12.6 % — ABNORMAL HIGH (ref 3.0–12.0)
Neutro Abs: 3.8 10*3/uL (ref 1.4–7.7)
Neutrophils Relative %: 56.3 % (ref 43.0–77.0)
Platelets: 235 10*3/uL (ref 150.0–400.0)
RBC: 4.83 Mil/uL (ref 4.22–5.81)
RDW: 13.6 % (ref 11.5–15.5)
WBC: 6.7 10*3/uL (ref 4.0–10.5)

## 2021-01-20 LAB — TSH: TSH: 3.53 u[IU]/mL (ref 0.35–4.50)

## 2021-01-20 LAB — HEPATIC FUNCTION PANEL
ALT: 17 U/L (ref 0–53)
AST: 16 U/L (ref 0–37)
Albumin: 4.4 g/dL (ref 3.5–5.2)
Alkaline Phosphatase: 52 U/L (ref 39–117)
Bilirubin, Direct: 0.2 mg/dL (ref 0.0–0.3)
Total Bilirubin: 0.9 mg/dL (ref 0.2–1.2)
Total Protein: 6.6 g/dL (ref 6.0–8.3)

## 2021-01-20 LAB — LIPID PANEL
Cholesterol: 125 mg/dL (ref 0–200)
HDL: 41.6 mg/dL (ref >39.00)
LDL Cholesterol: 64 mg/dL (ref 0–99)
NonHDL: 82.9
Total CHOL/HDL Ratio: 3
Triglycerides: 95 mg/dL (ref 0.0–149.0)
VLDL: 19 mg/dL (ref 0.0–40.0)

## 2021-01-20 LAB — BASIC METABOLIC PANEL
BUN: 19 mg/dL (ref 6–23)
CO2: 25 mEq/L (ref 19–32)
Calcium: 8.9 mg/dL (ref 8.4–10.5)
Chloride: 103 mEq/L (ref 96–112)
Creatinine, Ser: 0.76 mg/dL (ref 0.40–1.50)
GFR: 92.35 mL/min (ref >60.00)
Glucose, Bld: 134 mg/dL — ABNORMAL HIGH (ref 70–99)
Potassium: 4.4 mEq/L (ref 3.5–5.1)
Sodium: 138 mEq/L (ref 135–145)

## 2021-01-20 LAB — HEMOGLOBIN A1C: Hgb A1c MFr Bld: 7.8 % — ABNORMAL HIGH (ref 4.6–6.5)

## 2021-01-20 NOTE — Progress Notes (Signed)
   Subjective:    Patient ID: [name] [name], male    DOB: [birth year], 70 y.o.   MRN: [id number]  HPI HTN- chronic problem, on Amlodipine 2.5mg  daily, Metoprolol XL 100mg  daily, Entresto 97/103, and Spironolactone 12.5mg  w/ good control.  Pt reports feeling good.  No CP, SOB, HAs, visual changes, edema.  Hyperlipidemia- chronic problem, on Crestor 40mg  daily.  Denies abd pain, N/V.  DM- last A1C 7.3%.  UTD on eye exam (due in August), due for foot exam.  On Entresto for renal protection.  On Metformin 500mg  BID.  Initially had GI issues but this improved.  No numbness/tingling of feet.  No sores or blisters on feet.  Denies symptomatic lows.  R shoulder pain- pt has L brachial nerve palsy so he relies on R arm for everything.  Reports pulling/straining arm the beginning of May and it has not improved.  Pain will keep him awake at night.     Review of Systems For ROS see HPI   This visit occurred during the SARS-CoV-2 public health emergency.  Safety protocols were in place, including screening questions prior to the visit, additional usage of staff PPE, and extensive cleaning of exam room while observing appropriate contact time as indicated for disinfecting solutions.      Objective:   Physical Exam Vitals reviewed.  Constitutional:      General: He is not in acute distress.    Appearance: Normal appearance. He is well-developed.  HENT:     Head: Normocephalic and atraumatic.  Eyes:     Extraocular Movements: Extraocular movements intact.     Conjunctiva/sclera: Conjunctivae normal.     Pupils: Pupils are equal, round, and reactive to light.  Neck:     Thyroid: No thyromegaly.  Cardiovascular:     Rate and Rhythm: Normal rate and regular rhythm.     Pulses: Normal pulses.     Comments: + mechanical valve Pulmonary:     Effort: Pulmonary effort is normal. No respiratory distress.     Breath sounds: Normal breath sounds.  Abdominal:     General: Bowel sounds are normal. There  is no distension.     Palpations: Abdomen is soft.  Musculoskeletal:     Cervical back: Normal range of motion and neck supple.     Right lower leg: No edema.     Left lower leg: No edema.  Lymphadenopathy:     Cervical: No cervical adenopathy.  Skin:    General: Skin is warm and dry.  Neurological:     General: No focal deficit present.     Mental Status: He is alert and oriented to person, place, and time.     Cranial Nerves: No cranial nerve deficit.  Psychiatric:        Mood and Affect: Mood normal.        Behavior: Behavior normal.          Assessment & Plan:  R shoulder pain- new.  Pt has only 1 functioning arm due to brachial plexus palsy on the L so any R arm/shoulder pain is very limiting for him.  Will refer to ortho ASAP to try and improve his sxs as they have already been going on for a month.  Pt expressed understanding and is in agreement w/ plan.

## 2021-01-20 NOTE — Assessment & Plan Note (Signed)
Ongoing issue for pt.  Last A1C 7.3%  He is now tolerating Metformin w/o difficulty after initially having GI upset.  He did not tolerate Jardiance well and decided to go back to Metformin due to cost.  UTD on eye exam.  Foot exam done today.  Check labs.  Adjust meds prn

## 2021-01-20 NOTE — Assessment & Plan Note (Signed)
Chronic problem.  On Crestor 40mg daily w/o difficulty.  Check labs.  Adjust meds prn  

## 2021-01-20 NOTE — Patient Instructions (Signed)
Schedule your complete physical in 6 months We'll notify you of your lab results and make any changes if needed Continue to work on healthy diet and regular exercise- you're doing great! You are due for your eye exam in August- please have them send me a copy We'll call you with your ortho appt for the shoulder Call with any questions or concerns Stay Safe!  Stay Healthy! Have a great summer!!!

## 2021-01-20 NOTE — Assessment & Plan Note (Signed)
Chronic problem.  Currently well controlled on Amlodipine, Metoprolol, Entresto, Spironolactone.  Asymptomatic at this time.  Check labs.  No anticipated med changes.  Will follow.

## 2021-01-21 ENCOUNTER — Ambulatory Visit
Admission: RE | Admit: 2021-01-21 | Discharge: 2021-01-21 | Disposition: A | Discharge status: Home / Self Care | Payer: Medicare Other | Source: Ambulatory Visit | Attending: Thoracic Surgery (Cardiothoracic Vascular Surgery) | Admitting: Thoracic Surgery (Cardiothoracic Vascular Surgery)

## 2021-01-21 DIAGNOSIS — I712 Thoracic aortic aneurysm, without rupture, unspecified: Secondary | ICD-10-CM

## 2021-01-21 LAB — HEPATITIS C ANTIBODY
Hepatitis C Ab: NONREACTIVE
SIGNAL TO CUT-OFF: 0.01 (ref <1.00)

## 2021-01-21 MED ORDER — GADOBENATE DIMEGLUMINE 529 MG/ML IV SOLN
20.0000 mL | Freq: Once | INTRAVENOUS | Status: AC | PRN
  Administered 2021-01-21: 20 mL via INTRAVENOUS

## 2021-01-25 ENCOUNTER — Other Ambulatory Visit: Payer: Self-pay | Admitting: Family Medicine

## 2021-01-25 DIAGNOSIS — J329 Chronic sinusitis, unspecified: Secondary | ICD-10-CM

## 2021-01-29 ENCOUNTER — Encounter: Payer: Self-pay | Admitting: *Deleted

## 2021-02-06 ENCOUNTER — Encounter: Payer: Self-pay | Admitting: Orthopedic Surgery

## 2021-02-06 ENCOUNTER — Ambulatory Visit: Payer: Self-pay

## 2021-02-06 ENCOUNTER — Ambulatory Visit: Payer: Medicare Other | Admitting: Orthopedic Surgery

## 2021-02-06 DIAGNOSIS — M25511 Pain in right shoulder: Secondary | ICD-10-CM

## 2021-02-06 NOTE — Progress Notes (Signed)
Office Visit Note   Patient: [name] [name]           Date of Birth: Dec 16, 1950           MRN: [id number] Visit Date: 02/06/2021 Requested by: [name], MD [address] [place],  [place] [address] PCP: [name], MD  Subjective: Chief Complaint  Patient presents with   Right Shoulder - Pain    HPI: [name] [name] is a 70 year old patient with right shoulder pain.  He is very right-hand-dominant due to nerve injury to the left shoulder which affected his deltoid and biceps.  He actually hurt his right shoulder picking up a ladder in the second week of May.  It has improved over the past several weeks.  Localizes pain to the deltoid insertion region anteriorly as well as the pec insertion region anteriorly.  Was able to play golf for 14 holes yesterday.  Hurts to sleep on the right-hand side.  Denies any mechanical symptoms.              ROS: All systems reviewed are negative as they relate to the chief complaint within the history of present illness.  Patient denies  fevers or chills.   Assessment & Plan: Visit Diagnoses:  1. Right shoulder pain, unspecified chronicity     Plan: Impression is right shoulder pain of unclear etiology.  Rotator cuff strength feels pretty good except for slight weakness with infraspinatus testing.  No discrete AC joint tenderness.  No limitation of passive range of motion.  Could be biceps tendinitis rotator cuff problem or outpatient rotator cuff strain.  Plan is observation for now.  It is improving.  Had a good functional test yesterday with golf.  4-week return if symptoms or not improving at which time we will proceed with MRI scanning.  Follow-Up Instructions: Return if symptoms worsen or fail to improve.   Orders:  Orders Placed This Encounter  Procedures   XR Shoulder Right   No orders of the defined types were placed in this encounter.     Procedures: No procedures performed   Clinical Data: No additional  findings.  Objective: Vital Signs: There were no vitals taken for this visit.  Physical Exam:   Constitutional: Patient appears well-developed HEENT:  Head: Normocephalic Eyes:EOM are normal Neck: Normal range of motion Cardiovascular: Normal rate Pulmonary/chest: Effort normal Neurologic: Patient is alert Skin: Skin is warm Psychiatric: Patient has normal mood and affect   Ortho Exam: Ortho exam demonstrates full active and passive range of motion of the right shoulder.  Passive range of motion is 50/100/170.  Rotator cuff strength is good demonstrate supraspinatus absent muscle testing but slight pain with resisted infraspinatus testing.  No coarse grinding or crepitus with active or passive range of motion at 15 degrees of abduction or 90 degrees of abduction.  No discrete AC joint tenderness is present.  Some pain over the biceps region anteriorly.  Negative O'Brien's testing.  Specialty Comments:  No specialty comments available.  Imaging: XR Shoulder Right  Result Date: 02/06/2021 AP axillary outlet radiographs right shoulder reviewed.  No acute fracture.  Mild AC joint to moderate AC joint arthritis is present.  No glenohumeral joint arthritis.  Visualized lung fields clear.  Acromiohumeral distance maintained.    PMFS History: Patient Active Problem List   Diagnosis Date Noted   Abdominal bloating 12/14/2019   Controlled diabetes mellitus type 2 with complications ([place]) 72/53/6644   Obesity (BMI 30-39.9) 06/16/2019  Headache syndrome 10/18/2018   Laryngopharyngeal reflux (LPR) 08/10/2016   BPH (benign prostatic hyperplasia) 06/03/2015   H/O Ross procedure 01/31/2015   Thoracic aortic aneurysm ([place]) 12/11/2013   Ganglion cyst 06/01/2013   Brachial plexus neuropathy 10/07/2011   Cervical disc disease 10/07/2011   General medical examination 03/11/2011   Carpopedal spasm 02/25/2011   Arthritis of hand 02/25/2011   CARPAL TUNNEL SYNDROME, BILATERAL 06/10/2009    AORTIC VALVE REPLACEMENT, HX OF 06/10/2009   Hyperlipidemia 08/08/2008   Essential hypertension 08/08/2008   ESOPHAGEAL STRICTURE 03/29/2003   GERD 03/29/2003   HIATAL HERNIA 10/21/2000   Past Medical History:  Diagnosis Date   Acute gastritis    Aortic insufficiency    s/p aortic valve replacement in 1994   CHF (congestive heart failure) (HCC)    Early satiety    Esophageal stricture    GERD (gastroesophageal reflux disease)    Headache syndrome 10/18/2018   Hiatal hernia    Hyperlipidemia    Neuropathy    left brachial plexus   Pain, dental     Family History  Problem Relation Age of Onset   Lung cancer Father        hx of smoking    Hypertension Other    Prostate cancer Other    Leukemia Brother        Passed at age of 48   Colon cancer Neg Hx    Pancreatic cancer Neg Hx    Rectal cancer Neg Hx    Stomach cancer Neg Hx     Past Surgical History:  Procedure Laterality Date   AORTIC VALVE REPLACEMENT  1994   CARDIOVERSION N/A 03/14/2020   Procedure: CARDIOVERSION;  Surgeon: [name], MD;  Location[facility] ENDOSCOPY;  Service: Cardiovascular;  Laterality: N/A;   GANGLION CYST EXCISION  JAN 2002   HIATAL HERNIA REPAIR  [place] 2002   TEE WITHOUT CARDIOVERSION N/A 03/14/2020   Procedure: TRANSESOPHAGEAL ECHOCARDIOGRAM (TEE);  Surgeon: [name], MD;  Location: [facility] ENDOSCOPY;  Service: Cardiovascular;  Laterality: N/A;   TONSILLECTOMY     VASECTOMY  [place] 2002   Social History   Occupational History   Occupation: retired  Tobacco Use   Smoking status: Never   Smokeless tobacco: Never  Vaping Use   Vaping Use: Never used  Substance and Sexual Activity   Alcohol use: No   Drug use: No   Sexual activity: Not on file

## 2021-02-16 ENCOUNTER — Encounter: Payer: Self-pay | Admitting: Family Medicine

## 2021-02-18 ENCOUNTER — Ambulatory Visit: Payer: Medicare Other | Admitting: Thoracic Surgery (Cardiothoracic Vascular Surgery)

## 2021-02-19 ENCOUNTER — Telehealth: Payer: Self-pay | Admitting: Family Medicine

## 2021-02-19 ENCOUNTER — Other Ambulatory Visit: Payer: Self-pay

## 2021-02-19 MED ORDER — METFORMIN HCL 500 MG PO TABS: 500.0000 mg | ORAL_TABLET | Freq: Every day | ORAL | 0 refills | Status: DC

## 2021-02-19 NOTE — Telephone Encounter (Signed)
Medication sent to pharmacy  

## 2021-02-19 NOTE — Telephone Encounter (Signed)
Patient would like his Metformin sent to [facility] in [place] - Patient would like a 90 day refill.  Please advise

## 2021-02-20 ENCOUNTER — Telehealth: Payer: Self-pay

## 2021-02-20 NOTE — Telephone Encounter (Signed)
Patient called back and was informed on how to take his metformin per chart(1 tablet daily). Patient voiced understanding.

## 2021-02-20 NOTE — Telephone Encounter (Signed)
Called patient to verify he is referring to the metformin. Per chat, patient is supposed to be taking 1 tablet daily.

## 2021-02-20 NOTE — Telephone Encounter (Signed)
Pt is calling back Pt said he was taking two Metformin 500 mg a day and now he is prescribed taking 1 tablet a day? He is wanting to know if Dr Birdie Riddle wants him to drop his medication to 1 a day?   Pt call back [phone]

## 2021-02-21 NOTE — Telephone Encounter (Signed)
Error

## 2021-02-27 ENCOUNTER — Other Ambulatory Visit (HOSPITAL_COMMUNITY): Payer: Self-pay | Admitting: Cardiology

## 2021-03-05 ENCOUNTER — Telehealth: Payer: Self-pay

## 2021-03-05 ENCOUNTER — Other Ambulatory Visit: Payer: Self-pay

## 2021-03-05 DIAGNOSIS — G540 Brachial plexus disorders: Secondary | ICD-10-CM

## 2021-03-05 MED ORDER — FINASTERIDE 5 MG PO TABS: ORAL_TABLET | ORAL | 1 refills | Status: DC

## 2021-03-05 NOTE — Telephone Encounter (Signed)
Medication sent to patient's pharmacy.

## 2021-03-05 NOTE — Telephone Encounter (Signed)
Pt needs refill on finasteride (PROSCAR) 5 MG tablet   [id number] - [place], [place] - [address] AT [facility] OF [address]   Pt call back [phone]

## 2021-03-07 ENCOUNTER — Telehealth: Payer: Self-pay

## 2021-03-07 NOTE — Telephone Encounter (Signed)
[employer] [phone] Butch Penny)  Fax number [phone] send attn to [facility] Management   Needs med list and most resent labs   Will need confirm if he has diabetes and heart failure

## 2021-03-10 NOTE — Telephone Encounter (Signed)
Med list, recent labs and problem list sent to Butch Penny at [facility] by fax

## 2021-03-23 ENCOUNTER — Other Ambulatory Visit: Payer: Self-pay | Admitting: Family Medicine

## 2021-03-23 DIAGNOSIS — K219 Gastro-esophageal reflux disease without esophagitis: Secondary | ICD-10-CM

## 2021-03-25 ENCOUNTER — Ambulatory Visit: Payer: HMO | Admitting: Thoracic Surgery (Cardiothoracic Vascular Surgery)

## 2021-03-25 ENCOUNTER — Other Ambulatory Visit: Payer: Self-pay

## 2021-03-25 ENCOUNTER — Encounter: Payer: Self-pay | Admitting: Thoracic Surgery (Cardiothoracic Vascular Surgery)

## 2021-03-25 VITALS — BP 110/77 | HR 72 | Resp 20 | Ht 75.0 in | Wt 238.0 lb

## 2021-03-25 DIAGNOSIS — I712 Thoracic aortic aneurysm, without rupture, unspecified: Secondary | ICD-10-CM

## 2021-03-25 NOTE — Progress Notes (Signed)
[address]       [place] [address]             [phone]     HPI: [name] [name] returns for follow-up of his ascending aneurysm  [name] [name] is a 70 year old man with a past medical history significant for congenital aortic insufficiency, Ross procedure, hiatal hernia, reflux, esophageal stricture, hyperlipidemia, and left brachial plexopathy.  He had a Ross procedure done in [place] in 1994.  He has had reports of his neoaortic root being dilated dating back to 2002.  He had an MRI in 2015 and has been followed since then.  Dr. Servando Snare had been following him until he retired.  [name] [name] has been feeling well.  He denies any chest pain, pressure, or tightness.  He has not been having any orthopnea, paroxysmal nocturnal dyspnea or peripheral edema.  Past Medical History:  Diagnosis Date   Acute gastritis    Aortic insufficiency    s/p aortic valve replacement in 1994   CHF (congestive heart failure) (HCC)    Early satiety    Esophageal stricture    GERD (gastroesophageal reflux disease)    Headache syndrome 10/18/2018   Hiatal hernia    Hyperlipidemia    Neuropathy    left brachial plexus   Pain, dental    Past Surgical History:  Procedure Laterality Date   AORTIC VALVE REPLACEMENT  1994   CARDIOVERSION N/A 03/14/2020   Procedure: CARDIOVERSION;  Surgeon: [name], MD;  Location: [facility];  Service: Cardiovascular;  Laterality: N/A;   GANGLION CYST EXCISION  JAN 2002   HIATAL HERNIA REPAIR  [place] 2002   TEE WITHOUT CARDIOVERSION N/A 03/14/2020   Procedure: TRANSESOPHAGEAL ECHOCARDIOGRAM (TEE);  Surgeon: [name], MD;  Location: [facility];  Service: Cardiovascular;  Laterality: N/A;   TONSILLECTOMY     VASECTOMY  DEC 2002   '  Current Outpatient Medications  Medication Sig Dispense Refill   amLODipine (NORVASC) 2.5 MG tablet Take 1 tablet (2.5 mg total) by mouth daily. 30 tablet 11   apixaban (ELIQUIS) 5 MG TABS tablet Take 1  tablet (5 mg total) by mouth 2 (two) times daily. 60 tablet 11   cetirizine (ZYRTEC) 10 MG tablet TAKE 1 TABLET(10 MG) BY MOUTH DAILY 90 tablet 0   finasteride (PROSCAR) 5 MG tablet TAKE 1 TABLET(5 MG) BY MOUTH DAILY 90 tablet 1   furosemide (LASIX) 20 MG tablet Take 1 tablet (20 mg total) by mouth every other day. 15 tablet 11   metFORMIN (GLUCOPHAGE) 500 MG tablet Take 1 tablet (500 mg total) by mouth daily. 90 tablet 0   metoprolol succinate (TOPROL-XL) 100 MG 24 hr tablet Take 1 tablet (100 mg total) by mouth 2 (two) times daily. 180 tablet 3   omeprazole (PRILOSEC) 40 MG capsule TAKE 1 CAPSULE(40 MG) BY MOUTH DAILY 90 capsule 1   rosuvastatin (CRESTOR) 40 MG tablet Take 1 tablet (40 mg total) by mouth daily. 90 tablet 3   sacubitril-valsartan (ENTRESTO) 97-103 MG Take 1 tablet by mouth 2 (two) times daily. 180 tablet 3   spironolactone (ALDACTONE) 25 MG tablet Take 0.5 tablets (12.5 mg total) by mouth daily. 15 tablet 6   fluticasone (FLONASE) 50 MCG/ACT nasal spray Place 2 sprays into both nostrils daily. 16 g 6   No current facility-administered medications for this visit.    Physical Exam BP 110/77 (BP Location: Right Arm, Patient Position: Sitting)   Pulse 72  Resp 20   Ht '6\' 3"'$  (1.905 m)   Wt 238 lb (108 kg)   SpO2 97% Comment: RA  BMI 29.48 kg/m  70 year old man in no acute distress Well-developed well-nourished Alert and oriented x3 No carotid bruits Cardiac regular rate and rhythm with a faint early systolic murmur and wide split S2. Lungs clear equal breath sounds bilaterally No peripheral edema  Diagnostic Tests: MRA CHEST WITH OR WITHOUT CONTRAST   TECHNIQUE: Angiographic images of the chest were obtained using MRA technique with and without intravenous contrast.   CONTRAST:  16m MULTIHANCE GADOBENATE DIMEGLUMINE 529 MG/ML IV SOLN   COMPARISON:  MRA chest 01/18/2020.  Chest CTA 04/28/2018   FINDINGS: VASCULAR   Aorta: Postsurgical changes compatible  with a Ross procedure. Again noted is a diameter change involving the mid ascending thoracic aorta associated with the surgical procedure. Dilatation of the aortic root but difficult to evaluate due to motion artifact. The overall configuration of the aortic root is similar to the previous examination. Aortic root measures up to 5.4 cm which is stable. Prominent left coronary cusp is similar to the previous examinations but poorly characterized on this examination. Sinotubular junction region measures roughly 4.3 cm and stable. Ascending thoracic aorta near the surgical anastomosis measures 3.0 cm and minimally changed. Typical three-vessel arch anatomy. The great vessels are patent. Left common carotid artery is widely patent. No significant stenosis in the proximal left internal and external carotid arteries. Right common carotid artery is patent. No significant stenosis in the right internal and external carotid arteries. Bilateral vertebral arteries are patent. Aortic arch just distal to the left subclavian artery measures 3.2 cm and stable. Mid descending thoracic aorta measures 3.1 cm and stable.   Heart: Heart size is grossly stable. No significant pericardial effusion.   Pulmonary Arteries: Postsurgical changes from the pulmonary valve replacement. Pulmonary artery near the surgical anastomosis measures roughly 2.5 cm and unchanged from the CTA on 2019. Main pulmonary arteries are patent without large filling defects.   Other: Subclavians veins, innominate veins and SVC are patent.   NON-VASCULAR   Chronic scarring in the left lower lobe. No large pleural effusions. Limited evaluation of the mediastinal structures. Imaging of the upper abdomen is unremarkable.   IMPRESSION: Stable postsurgical changes compatible with history of a Ross procedure. Limited evaluation of the thoracic aorta due to motion artifact on this examination but no significant change in the size or  configuration of the aortic root, ascending thoracic aorta and main pulmonary artery. Aortic root measures up to 5.4 cm and stable.     Electronically Signed   By: AMarkus DaftM.D.   On: 01/21/2021 12:06 I personally reviewed the MR images and concur with the findings noted above.  No change in the size of neoaortic root  Impression: [name] [name] a 70year old man with a past medical history significant for congenital aortic insufficiency, Ross procedure, hiatal hernia, reflux, esophageal stricture, hyperlipidemia, and left brachial plexopathy.  [place]  Status post Ross procedure 1994 with dilated neoaortic root-has been stable dating back to 2015.  Needs continued annual follow-up.  Aortic insufficiency-mild by echo in 2021.  Scheduled for another echo in October.  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  Plan: Return in 1 year with MR angio of chest  I spent over 20 minutes in review of records, images, and in consultation with [name] [name]. SMelrose Nakayama MD [facility] [phone]

## 2021-03-30 ENCOUNTER — Other Ambulatory Visit (HOSPITAL_COMMUNITY): Payer: Self-pay | Admitting: Cardiology

## 2021-04-04 ENCOUNTER — Encounter: Payer: Self-pay | Admitting: Family Medicine

## 2021-04-04 ENCOUNTER — Other Ambulatory Visit: Payer: Self-pay

## 2021-04-04 ENCOUNTER — Ambulatory Visit (INDEPENDENT_AMBULATORY_CARE_PROVIDER_SITE_OTHER): Payer: HMO

## 2021-04-04 ENCOUNTER — Ambulatory Visit: Payer: HMO | Admitting: Family Medicine

## 2021-04-04 VITALS — BP 110/78 | HR 71 | Ht 75.0 in | Wt 237.0 lb

## 2021-04-04 DIAGNOSIS — M545 Low back pain, unspecified: Secondary | ICD-10-CM

## 2021-04-04 DIAGNOSIS — M5136 Other intervertebral disc degeneration, lumbar region: Secondary | ICD-10-CM | POA: Diagnosis not present

## 2021-04-04 MED ORDER — GABAPENTIN 100 MG PO CAPS: 100.0000 mg | ORAL_CAPSULE | Freq: Every day | ORAL | 0 refills | Status: DC

## 2021-04-04 NOTE — Patient Instructions (Signed)
Xray today Gabapentin '100mg'$  at night Vit D 2000IU daily PT [id number] See me in 5-6 weeks

## 2021-04-04 NOTE — Progress Notes (Signed)
[place] Phone: [phone] Subjective:    I'm seeing this patient by the request  of:  [name], MD  CC: low back pain   [id number]  [name] [name] is a 70 y.o. male coming in with complaint of chronic LBP. Patient states that pain is in center of back and feels like its locked up. Likes to golf, be active and do yardwork. Pain is worse at end of the day and has hard time standing up straight. Has not been able to play golf this year. Denies any radiating symptoms.      Past Medical History:  Diagnosis Date   Acute gastritis    Aortic insufficiency    s/p aortic valve replacement in 1994   CHF (congestive heart failure) (HCC)    Early satiety    Esophageal stricture    GERD (gastroesophageal reflux disease)    Headache syndrome 10/18/2018   Hiatal hernia    Hyperlipidemia    Neuropathy    left brachial plexus   Pain, dental    Past Surgical History:  Procedure Laterality Date   AORTIC VALVE REPLACEMENT  1994   CARDIOVERSION N/A 03/14/2020   Procedure: CARDIOVERSION;  Surgeon: [name], MD;  Location: [facility];  Service: Cardiovascular;  Laterality: N/A;   GANGLION CYST EXCISION  JAN 2002   HIATAL HERNIA REPAIR  [place] 2002   TEE WITHOUT CARDIOVERSION N/A 03/14/2020   Procedure: TRANSESOPHAGEAL ECHOCARDIOGRAM (TEE);  Surgeon: [name], MD;  Location: [facility];  Service: Cardiovascular;  Laterality: N/A;   TONSILLECTOMY     VASECTOMY  DEC 2002   Social History   Socioeconomic History   Marital status: Married    Spouse name: Belenda Cruise    Number of children: 3   Years of education: Not on file   Highest education level: Master's degree (e.g., MA, MS, MEng, MEd, MSW, MBA)  Occupational History   Occupation: retired  Tobacco Use   Smoking status: Never   Smokeless tobacco: Never  Vaping Use   Vaping Use: Never used  Substance and Sexual Activity   Alcohol  use: No   Drug use: No   Sexual activity: Not on file  Other Topics Concern   Not on file  Social History Narrative   Caffeine 1-2 cups daily    Right handed   Live in [place] with spouse Belenda Cruise    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   Social Determinants of Radio broadcast assistant Strain: Low Risk    Difficulty of Paying Living Expenses: Not hard at all  Food Insecurity: No Food Insecurity   Worried About Charity fundraiser in the Last Year: Never true   Arboriculturist in the Last Year: Never true  Transportation Needs: No Transportation Needs   Lack of Transportation (Medical): No   Lack of Transportation (Non-Medical): No  Physical Activity: Sufficiently Active   Days of Exercise per Week: 3 days   Minutes of Exercise per Session: 90 min  Stress: No Stress Concern Present   Feeling of Stress : Not at all  Social Connections: Moderately Isolated   Frequency of Communication with Friends and Family: More than three times a week   Frequency of Social Gatherings with Friends and Family: More than three times a week   Attends Religious Services: Never   Marine scientist or Organizations: No   Attends [employer]  or Organization Meetings: Never   Marital Status: Married   Allergies  Allergen Reactions   Penicillins Hives and Swelling   Quinidine Other (See Comments)    Increase heart beat per patient   Quinolones     Unknown reaction    Family History  Problem Relation Age of Onset   Lung cancer Father        hx of smoking    Hypertension Other    Prostate cancer Other    Leukemia Brother        Passed at age of 17   Colon cancer Neg Hx    Pancreatic cancer Neg Hx    Rectal cancer Neg Hx    Stomach cancer Neg Hx     Current Outpatient Medications (Endocrine & Metabolic):    metFORMIN (GLUCOPHAGE) 500 MG tablet, Take 1 tablet (500 mg total) by mouth daily.  Current Outpatient Medications (Cardiovascular):    amLODipine (NORVASC) 2.5  MG tablet, Take 1 tablet (2.5 mg total) by mouth daily.   furosemide (LASIX) 20 MG tablet, Take 1 tablet (20 mg total) by mouth every other day.   metoprolol succinate (TOPROL-XL) 100 MG 24 hr tablet, Take 1 tablet (100 mg total) by mouth 2 (two) times daily.   rosuvastatin (CRESTOR) 40 MG tablet, Take 1 tablet (40 mg total) by mouth daily.   sacubitril-valsartan (ENTRESTO) 97-103 MG, Take 1 tablet by mouth 2 (two) times daily.   spironolactone (ALDACTONE) 25 MG tablet, TAKE 1/2 TABLET(12.5 MG) BY MOUTH DAILY  Current Outpatient Medications (Respiratory):    cetirizine (ZYRTEC) 10 MG tablet, TAKE 1 TABLET(10 MG) BY MOUTH DAILY   fluticasone (FLONASE) 50 MCG/ACT nasal spray, Place 2 sprays into both nostrils daily.   Current Outpatient Medications (Hematological):    apixaban (ELIQUIS) 5 MG TABS tablet, Take 1 tablet (5 mg total) by mouth 2 (two) times daily.  Current Outpatient Medications (Other):    finasteride (PROSCAR) 5 MG tablet, TAKE 1 TABLET(5 MG) BY MOUTH DAILY   gabapentin (NEURONTIN) 100 MG capsule, Take 1 capsule (100 mg total) by mouth at bedtime.   omeprazole (PRILOSEC) 40 MG capsule, TAKE 1 CAPSULE(40 MG) BY MOUTH DAILY   Reviewed prior external information including notes and imaging from  primary care provider As well as notes that were available from care everywhere and other healthcare systems.  Past medical history, social, surgical and family history all reviewed in electronic medical record.  No pertanent information unless stated regarding to the chief complaint.   Review of Systems:  No headache, visual changes, nausea, vomiting, diarrhea, constipation, dizziness, abdominal pain, skin rash, fevers, chills, night sweats, weight loss, swollen lymph nodes, body aches, joint swelling, chest pain, shortness of breath, mood changes. POSITIVE muscle aches  Objective  Blood pressure 110/78, pulse 71, height '6\' 3"'$  (1.905 m), weight 237 lb (107.5 kg), SpO2 97 %.    General: No apparent distress alert and oriented x3 mood and affect normal, dressed appropriately.  HEENT: Pupils equal, extraocular movements intact  Respiratory: Patient's speak in full sentences and does not appear short of breath  Cardiovascular: No lower extremity edema, non tender, no erythema  Gait normal with good balance and coordination.  MSK: Patient's low back exam does have loss of lordosis.  Limited extension to 5 degrees.  Patient does have significant tightness with Corky Sox bilaterally.  Tightness with straight leg test but no radicular symptoms.  Significant weakness of the left upper extremity noted with atrophy. Patient does have neurovascular intact of  the lower extremities.  5 out of 5 strength with dorsiflexion and plantarflexion of the feet.   [id number]; 15 additional minutes spent for Therapeutic exercises as stated in above notes.  This included exercises focusing on stretching, strengthening, with significant focus on eccentric aspects.   Long term goals include an improvement in range of motion, strength, endurance as well as avoiding reinjury. Patient's frequency would include in 1-2 times a day, 3-5 times a week for a duration of 6-12 weeks. Low back exercises that included:  Pelvic tilt/bracing instruction to focus on control of the pelvic girdle and lower abdominal muscles  Glute strengthening exercises, focusing on proper firing of the glutes without engaging the low back muscles Proper stretching techniques for maximum relief for the hamstrings, hip flexors, low back and some rotation where tolerated  Proper technique shown and discussed handout in great detail with ATC.  All questions were discussed and answered.     Impression and Recommendations:     The above documentation has been reviewed and is accurate and complete Lyndal Pulley, DO

## 2021-04-04 NOTE — Assessment & Plan Note (Signed)
Degenerative disc disease of the lumbar spine.  Discussed with patient about icing regimen and home exercises.  Discussed with patient that I do feel that spinal stenosis is a potential contributor.  Started on low-dose of gabapentin.  We will start with formal physical therapy.  Patient is able to stay active where he feels fit.  Patient does not have any significant radicular symptoms at the moment.  Patient may be a candidate for possible osteopathic manipulation but could be difficult secondary to the severity follow-up again in 4 to 8 weeks

## 2021-04-17 ENCOUNTER — Telehealth: Payer: Self-pay

## 2021-04-17 ENCOUNTER — Other Ambulatory Visit (HOSPITAL_COMMUNITY): Payer: Self-pay | Admitting: *Deleted

## 2021-04-17 ENCOUNTER — Encounter: Payer: Self-pay | Admitting: Family Medicine

## 2021-04-17 ENCOUNTER — Other Ambulatory Visit: Payer: Self-pay

## 2021-04-17 DIAGNOSIS — H52203 Unspecified astigmatism, bilateral: Secondary | ICD-10-CM | POA: Diagnosis not present

## 2021-04-17 DIAGNOSIS — K219 Gastro-esophageal reflux disease without esophagitis: Secondary | ICD-10-CM

## 2021-04-17 DIAGNOSIS — E119 Type 2 diabetes mellitus without complications: Secondary | ICD-10-CM | POA: Diagnosis not present

## 2021-04-17 DIAGNOSIS — H524 Presbyopia: Secondary | ICD-10-CM | POA: Diagnosis not present

## 2021-04-17 LAB — HM DIABETES EYE EXAM

## 2021-04-17 MED ORDER — PANTOPRAZOLE SODIUM 40 MG PO TBEC: 40.0000 mg | DELAYED_RELEASE_TABLET | Freq: Every day | ORAL | 1 refills | Status: DC

## 2021-04-17 NOTE — Telephone Encounter (Signed)
Can something be sent to pharmacy for patient or a referral placed? Please advise

## 2021-04-17 NOTE — Progress Notes (Signed)
otonix

## 2021-04-17 NOTE — Telephone Encounter (Signed)
Instead of Omeprazole, please send in Protonix '40mg'$  daily, #90, 1 refill.  If no improvement on the protonix after a few days, he can take 1 Omeprazole in the morning and 1 protonix in the evening

## 2021-04-17 NOTE — Telephone Encounter (Signed)
I called patient back to see if he is in fact taking the omeprazole daily. Left a vm to return call to office to let us know.

## 2021-04-17 NOTE — Telephone Encounter (Signed)
Pt is having acid reflux flair up again and wants to see if Dr Birdie Riddle wants to treat or go on and refer him to a gastro Dr?  Pt call back [phone]

## 2021-04-17 NOTE — Telephone Encounter (Signed)
Rx filled and sen to patient pharmacy. Patient called and given instruction on how to take med per you. Patient understood. No further concerns at this time.

## 2021-04-17 NOTE — Telephone Encounter (Signed)
Patient returned call and stated that he is taking the omeprazole everyday and whatever else he can get his hands on. He stated that it seem like everything he eats turns into gas. Zantac is the other med that he is taking to get some relief. Please advise

## 2021-04-17 NOTE — Telephone Encounter (Signed)
Is pt taking his Omeprazole daily?  This was refilled in August

## 2021-04-19 ENCOUNTER — Other Ambulatory Visit: Payer: Self-pay

## 2021-04-19 ENCOUNTER — Encounter: Payer: Self-pay | Admitting: Physical Therapy

## 2021-04-19 ENCOUNTER — Ambulatory Visit: Payer: HMO | Attending: Family Medicine | Admitting: Physical Therapy

## 2021-04-19 DIAGNOSIS — G8929 Other chronic pain: Secondary | ICD-10-CM | POA: Insufficient documentation

## 2021-04-19 DIAGNOSIS — M545 Low back pain, unspecified: Secondary | ICD-10-CM | POA: Diagnosis not present

## 2021-04-19 DIAGNOSIS — R293 Abnormal posture: Secondary | ICD-10-CM | POA: Insufficient documentation

## 2021-04-19 NOTE — Therapy (Signed)
[place], [place], [address] Phone: [phone]   Fax:  [phone]  Physical Therapy Evaluation  Patient Details  Name: [name] [name] MRN: [id number] Date of Birth: [birth year] Referring Provider (PT): Hulan Saas, DO   Encounter Date: 04/19/2021   PT End of Session - 04/19/21 1151     Visit Number 1    Number of Visits 10    Date for PT Re-Evaluation 05/31/21    Authorization Type HA Medicare, progress note by visit 85 and recheck FOTO at visits 6 and 10    PT Start Time 0901    PT Stop Time 0946    PT Time Calculation (min) 45 min    Activity Tolerance Patient tolerated treatment well    Behavior During Therapy [facility] for tasks assessed/performed             Past Medical History:  Diagnosis Date   Acute gastritis    Aortic insufficiency    s/p aortic valve replacement in 1994   CHF (congestive heart failure) ([place])    Diabetes mellitus without complication ([place])    Early satiety    Esophageal stricture    GERD (gastroesophageal reflux disease)    Headache syndrome 10/18/2018   Hiatal hernia    Hyperlipidemia    Neuropathy    left brachial plexus   Pain, dental     Past Surgical History:  Procedure Laterality Date   AORTIC VALVE REPLACEMENT  1994   CARDIOVERSION N/A 03/14/2020   Procedure: CARDIOVERSION;  Surgeon: [name], MD;  Location: [place];  Service: Cardiovascular;  Laterality: N/A;   GANGLION CYST EXCISION  JAN 2002   HIATAL HERNIA REPAIR  [place] 2002   TEE WITHOUT CARDIOVERSION N/A 03/14/2020   Procedure: TRANSESOPHAGEAL ECHOCARDIOGRAM (TEE);  Surgeon: [name], MD;  Location: [facility] ENDOSCOPY;  Service: Cardiovascular;  Laterality: N/A;   TONSILLECTOMY     VASECTOMY  [place] 2002    There were no vitals filed for this visit.    Subjective Assessment - 04/19/21 0948     Subjective Pt. is a 70 y/o male referred to PT for c/o LBP. He reports multi-year  history of chronic lumbar issues but had an exacerbation of stiffness>pain after playing golf earlier this month/about 2 weeks ago. Symptoms include stiffness>pain which is minimal in the AM but increases with activity during the day. He also reports noticing decreased strength with difficulty maintaining an upright posture. No LE radiating symptoms noted and no bowel or bladder changes. He has seen chiro in the past but no formal PT to date. He has been doing HEP given at MD office but not much change in symptoms thus far.    Pertinent History DM, thoracic aortic aneurysm, chronic left UE weakness secondary to brachial plexus neuropathy, CHF    Limitations Lifting;House hold activities;Standing;Walking    Diagnostic tests X-rays    Patient Stated Goals Decrease stiffness/pain and improved activity tolerance, ability for recreational activities such as golf    Currently in Pain? No/denies                [facility] PT Assessment - 04/19/21 0001       Assessment   Medical Diagnosis Lumbar spine pain    Referring Provider (PT) Hulan Saas, DO    Onset Date/Surgical Date 04/04/21    Prior Therapy none      Precautions   Precaution Comments thoracic aortic aneurysm      Restrictions  Weight Bearing Restrictions No      Balance Screen   Has the patient fallen in the past 6 months --   not assessed at evaluation-plan check at first follow up visit     [place] residence    Living Arrangements Spouse/significant other      Prior Function   Level of Independence Independent with basic ADLs;Independent with community mobility without device      Cognition   Overall Cognitive Status Within Functional Limits for tasks assessed      Observation/Other Assessments   Focus on Therapeutic Outcomes (FOTO)  56% function      Posture/Postural Control   Posture Comments increased thoracic kyphosis with flattening of lumbar lordosis, rounded shoulders  bilaterally with left scapula elevated>right, forward head      ROM / Strength   AROM / PROM / Strength AROM;Strength      AROM   Overall AROM Comments Limited hip extension ROM bilat. (0 deg) and tightness limited FABER position otherwise hip AROM/PROM grossly WFL    AROM Assessment Site Lumbar;Hip    Right/Left Hip Right;Left    Lumbar Flexion 80    Lumbar Extension 10   concordant stiffness   Lumbar - Right Side Bend 20    Lumbar - Left Side Bend 20    Lumbar - Right Rotation 60%   concordant tightness (lumbar)   Lumbar - Left Rotation 50%   concordant tightness (lumbar)     Strength   Overall Strength Comments Bilat. LE MMTs grossly 5/5 excepting hip extension 4/5 bilat.    Strength Assessment Site --    Right/Left Hip --    Right/Left Knee Right;Left    Right/Left Ankle Right;Left      Flexibility   Soft Tissue Assessment /Muscle Length --   hamstring tightness with SLR 70 deg bilat. but no concordant symptoms, hip flexor tightness bilat.     Palpation   Spinal mobility hypomobility with concordant stiffness>pain with CPAs to L4 and L5      Special Tests   Other special tests SLR (-) bilat.                        Objective measurements completed on examination: See above findings.       [facility] Adult PT Treatment/Exercise - 04/19/21 0001       Exercises   Exercises --   brief HEP handout review/verbal instruction     Manual Therapy   Manual Therapy Joint mobilization    Joint Mobilization Grade 3 lumbar CPAs and transverse glides at L4-5                     PT Education - 04/19/21 1150     Education Details eval findings, potential symptom etiology, HEP, plan of care    Person(s) Educated Patient    Methods Explanation;Demonstration;Verbal cues;Handout    Comprehension Verbalized understanding                 PT Long Term Goals - 04/19/21 1202       PT LONG TERM GOAL #1   Title Independent with HEP    Baseline updated  at eval visit    Time 6    Period Weeks    Status New    Target Date 05/31/21      PT LONG TERM GOAL #2   Title Pt. to improve FOTO outcome measure score to  63% or greater functional status    Baseline 56%    Time 6    Period Weeks    Status New    Target Date 05/31/21      PT LONG TERM GOAL #3   Title Pt. to tolerate standing IADLs in afternoon and evening as needed with LBP symptoms decreased 50% or greater from baseline status at evaluation    Time 6    Period Weeks    Status New    Target Date 05/31/21      PT LONG TERM GOAL #4   Title Pt. to increase bilateral hip extension strength at least 1/2 MMT grade for decreased lumbar strain with lifting activities    Baseline 4/5    Time 6    Period Weeks    Status New    Target Date 05/31/21                    Plan - 04/19/21 1151     Clinical Impression Statement Pt. presents with recent exacerbation of chronic LBP with current stiffness>pain limiting lumbar ROM in extension and rotation. Hypomobility noted with lumbar passive accessory assessment to correlate with active physiologicals for trunk AROM. No symptoms concerning for mylelopathy or radiculopathy with evaluation findings consistent with stiffness predominant presentation for lumbar spine with associated muscle tightness. Added hip flexor stretches to HEP from MD office along with work on lumbar rotation ROM along with gentle standing stretch (self mobilization with movement) into lumbar extension to help address stiffness. Pt. would benefit from PT to help relieve concordant stiffness and pain to help improve functional activity tolerance.    Personal Factors and Comorbidities Age;Comorbidity 3+    Comorbidities diabetic, chronic LBP history, CHF, aortic aneurysm    Examination-Activity Limitations Lift;Locomotion Level;Stand;Carry    Examination-Participation Restrictions Community Activity;Cleaning;Occupation    Stability/Clinical Decision Making  Evolving/Moderate complexity    Clinical Decision Making Moderate    Rehab Potential Good    PT Frequency --   1-2x/week   PT Duration 6 weeks    PT Treatment/Interventions ADLs/Self Care Home Management;Electrical Stimulation;Moist Heat;Cryotherapy;Traction;Therapeutic exercise;Therapeutic activities;Manual techniques;Patient/family education;Dry needling;Taping;Neuromuscular re-education;Spinal Manipulations    PT Next Visit Plan Ask fall screening question, check response to HEP updates and manual therapy for lumbar mobilizations performed at evaluation, continue lumbar mobilizations with CPAs vs. UPAs to L4-5 region vs. adjacent segments at needed, lower lumbar transverse glides to promote rotation ROM, progress lumbar ROM, gluteal + core/lumbar stabilization and stretches as tolerated    PT Home Exercise Plan Access code: [email or web address] hip flexor stretch, LTR and gentle self mobilization with movement for standing lumbar extension holding strap, also performing HEP from MD office including pelvic tilts and hip bridge    Consulted and Agree with Plan of Care Patient             Patient will benefit from skilled therapeutic intervention in order to improve the following deficits and impairments:  Pain, Postural dysfunction, Impaired flexibility, Decreased strength, Decreased activity tolerance, Hypomobility, Decreased range of motion  Visit Diagnosis: Chronic midline low back pain without sciatica     Problem List Patient Active Problem List   Diagnosis Date Noted   Degenerative disc disease, lumbar 04/04/2021   Abdominal bloating 12/14/2019   Controlled diabetes mellitus type 2 with complications ([place]) [id number]   Obesity (BMI 30-39.9) 06/16/2019   Headache syndrome 10/18/2018   Laryngopharyngeal reflux (LPR) 08/10/2016   BPH (benign prostatic hyperplasia) 06/03/2015   H/O Ross procedure 01/31/2015  Thoracic aortic aneurysm ([place]) 12/11/2013   Ganglion cyst 06/01/2013    Brachial plexus neuropathy 10/07/2011   Cervical disc disease 10/07/2011   General medical examination 03/11/2011   Carpopedal spasm 02/25/2011   Arthritis of hand 02/25/2011   CARPAL TUNNEL SYNDROME, BILATERAL 06/10/2009   AORTIC VALVE REPLACEMENT, HX OF 06/10/2009   Hyperlipidemia 08/08/2008   Essential hypertension 08/08/2008   ESOPHAGEAL STRICTURE 03/29/2003   GERD 03/29/2003   HIATAL HERNIA 10/21/2000    Beaulah Dinning, PT, DPT 04/19/21 12:08 PM  [facility] [address] [place], [place], [address] Phone: [phone]   Fax:  [phone]  Name: ORVEL MACIOCE MRN: [id number] Date of Birth: August 12, 1950

## 2021-04-21 ENCOUNTER — Encounter: Payer: Self-pay | Admitting: Family Medicine

## 2021-04-21 ENCOUNTER — Encounter (HOSPITAL_COMMUNITY): Payer: Self-pay

## 2021-04-21 ENCOUNTER — Other Ambulatory Visit (HOSPITAL_COMMUNITY): Payer: Self-pay

## 2021-04-21 ENCOUNTER — Telehealth (HOSPITAL_COMMUNITY): Payer: Self-pay | Admitting: *Deleted

## 2021-04-21 NOTE — Telephone Encounter (Signed)
Pt called stating HTA needed verification that he has diabetes and HF, he request we call and give them that [place] at [phone], spoke w/representative and gave verification  Pt is aware this has been done.

## 2021-04-24 ENCOUNTER — Other Ambulatory Visit: Payer: Self-pay | Admitting: Family Medicine

## 2021-04-24 DIAGNOSIS — J329 Chronic sinusitis, unspecified: Secondary | ICD-10-CM

## 2021-04-24 DIAGNOSIS — B9789 Other viral agents as the cause of diseases classified elsewhere: Secondary | ICD-10-CM

## 2021-04-25 ENCOUNTER — Encounter (HOSPITAL_COMMUNITY): Payer: Self-pay

## 2021-04-25 ENCOUNTER — Other Ambulatory Visit (HOSPITAL_COMMUNITY): Payer: Self-pay | Admitting: *Deleted

## 2021-04-25 ENCOUNTER — Encounter: Payer: Self-pay | Admitting: Physical Therapy

## 2021-04-25 MED ORDER — AMLODIPINE BESYLATE 2.5 MG PO TABS: 2.5000 mg | ORAL_TABLET | Freq: Every day | ORAL | 11 refills | Status: DC

## 2021-04-28 ENCOUNTER — Ambulatory Visit: Payer: HMO | Admitting: Physical Therapy

## 2021-05-14 ENCOUNTER — Ambulatory Visit: Payer: HMO | Admitting: Family Medicine

## 2021-05-19 ENCOUNTER — Other Ambulatory Visit: Payer: Self-pay | Admitting: Family Medicine

## 2021-06-17 ENCOUNTER — Other Ambulatory Visit (HOSPITAL_COMMUNITY): Payer: Self-pay | Admitting: *Deleted

## 2021-06-17 MED ORDER — SACUBITRIL-VALSARTAN 97-103 MG PO TABS: 1.0000 | ORAL_TABLET | Freq: Two times a day (BID) | ORAL | 3 refills | Status: DC

## 2021-06-21 NOTE — Therapy (Signed)
[place], [place], [address] Phone: [phone]   Fax:  [phone]  Physical Therapy Evaluation/Discharge  Patient Details  Name: [name] [name] MRN: [id number] Date of Birth: 1951-01-27 Referring Provider (PT): Hulan Saas, DO   Encounter Date: 04/19/2021    Past Medical History:  Diagnosis Date   Acute gastritis    Aortic insufficiency    s/p aortic valve replacement in 1994   CHF (congestive heart failure) ([place])    Diabetes mellitus without complication ([place])    Early satiety    Esophageal stricture    GERD (gastroesophageal reflux disease)    Headache syndrome 10/18/2018   Hiatal hernia    Hyperlipidemia    Neuropathy    left brachial plexus   Pain, dental     Past Surgical History:  Procedure Laterality Date   AORTIC VALVE REPLACEMENT  1994   CARDIOVERSION N/A 03/14/2020   Procedure: CARDIOVERSION;  Surgeon: [name], MD;  Location: [place];  Service: Cardiovascular;  Laterality: N/A;   GANGLION CYST EXCISION  JAN 2002   HIATAL HERNIA REPAIR  [place] 2002   TEE WITHOUT CARDIOVERSION N/A 03/14/2020   Procedure: TRANSESOPHAGEAL ECHOCARDIOGRAM (TEE);  Surgeon: [name], MD;  Location[facility] ENDOSCOPY;  Service: Cardiovascular;  Laterality: N/A;   TONSILLECTOMY     VASECTOMY  [place] 2002    There were no vitals filed for this visit.                    Objective measurements completed on examination: See above findings.                     PT Long Term Goals - 04/19/21 1202       PT LONG TERM GOAL #1   Title Independent with HEP    Baseline updated at eval visit    Time 6    Period Weeks    Status New    Target Date 05/31/21      PT LONG TERM GOAL #2   Title Pt. to improve FOTO outcome measure score to 63% or greater functional status    Baseline 56%    Time 6    Period Weeks    Status New    Target Date 05/31/21      PT LONG  TERM GOAL #3   Title Pt. to tolerate standing IADLs in afternoon and evening as needed with LBP symptoms decreased 50% or greater from baseline status at evaluation    Time 6    Period Weeks    Status New    Target Date 05/31/21      PT LONG TERM GOAL #4   Title Pt. to increase bilateral hip extension strength at least 1/2 MMT grade for decreased lumbar strain with lifting activities    Baseline 4/5    Time 6    Period Weeks    Status New    Target Date 05/31/21                     Patient will benefit from skilled therapeutic intervention in order to improve the following deficits and impairments:  Pain, Postural dysfunction, Impaired flexibility, Decreased strength, Decreased activity tolerance, Hypomobility, Decreased range of motion  Visit Diagnosis: Chronic midline low back pain without sciatica - Plan: PT plan of care cert/re-cert     Problem List Patient Active Problem List   Diagnosis Date Noted  Degenerative disc disease, lumbar 04/04/2021   Abdominal bloating 12/14/2019   Controlled diabetes mellitus type 2 with complications ([place]) 99/83/3825   Obesity (BMI 30-39.9) 06/16/2019   Headache syndrome 10/18/2018   Laryngopharyngeal reflux (LPR) 08/10/2016   BPH (benign prostatic hyperplasia) 06/03/2015   H/O Ross procedure 01/31/2015   Thoracic aortic aneurysm 12/11/2013   Ganglion cyst 06/01/2013   Brachial plexus neuropathy 10/07/2011   Cervical disc disease 10/07/2011   General medical examination 03/11/2011   Carpopedal spasm 02/25/2011   Arthritis of hand 02/25/2011   CARPAL TUNNEL SYNDROME, BILATERAL 06/10/2009   AORTIC VALVE REPLACEMENT, HX OF 06/10/2009   Hyperlipidemia 08/08/2008   Essential hypertension 08/08/2008   ESOPHAGEAL STRICTURE 03/29/2003   GERD 03/29/2003   HIATAL HERNIA 10/21/2000      PHYSICAL THERAPY DISCHARGE SUMMARY  Visits from Start of Care: 1  Current functional level related to goals / functional  outcomes: Patient did not return after initial evaluation    Remaining deficits: NA   Education / Equipment: NA   Patient agrees to discharge. Patient goals were not met. Patient is being discharged due to not returning since the last visit.     Beaulah Dinning, PT, DPT 06/21/21 9:32 AM   [facility] [address] [place], [place], [address] Phone: [phone]   Fax:  [phone]  Name: [name] [name] MRN: [id number] Date of Birth: 01/29/1951

## 2021-06-24 ENCOUNTER — Telehealth (HOSPITAL_COMMUNITY): Payer: Self-pay | Admitting: Pharmacy Technician

## 2021-06-24 DIAGNOSIS — L820 Inflamed seborrheic keratosis: Secondary | ICD-10-CM | POA: Diagnosis not present

## 2021-06-24 NOTE — Telephone Encounter (Signed)
Advanced Heart Failure Patient Advocate Encounter  Sent in [employer] renewal application via fax 20/91.  Will follow up.

## 2021-07-11 ENCOUNTER — Ambulatory Visit (HOSPITAL_COMMUNITY)
Admission: RE | Admit: 2021-07-11 | Discharge: 2021-07-11 | Disposition: A | Discharge status: Home / Self Care | Payer: HMO | Source: Ambulatory Visit | Attending: Cardiology | Admitting: Cardiology

## 2021-07-11 ENCOUNTER — Other Ambulatory Visit (HOSPITAL_COMMUNITY): Payer: Self-pay | Admitting: Cardiology

## 2021-07-11 ENCOUNTER — Ambulatory Visit (HOSPITAL_BASED_OUTPATIENT_CLINIC_OR_DEPARTMENT_OTHER)
Admission: RE | Admit: 2021-07-11 | Discharge: 2021-07-11 | Disposition: A | Discharge status: Home / Self Care | Payer: HMO | Source: Ambulatory Visit | Attending: Cardiology | Admitting: Cardiology

## 2021-07-11 ENCOUNTER — Encounter (HOSPITAL_COMMUNITY): Payer: Self-pay | Admitting: Cardiology

## 2021-07-11 ENCOUNTER — Other Ambulatory Visit: Payer: Self-pay

## 2021-07-11 VITALS — BP 130/82 | HR 62 | Wt 242.4 lb

## 2021-07-11 DIAGNOSIS — I440 Atrioventricular block, first degree: Secondary | ICD-10-CM | POA: Diagnosis not present

## 2021-07-11 DIAGNOSIS — G4733 Obstructive sleep apnea (adult) (pediatric): Secondary | ICD-10-CM | POA: Insufficient documentation

## 2021-07-11 DIAGNOSIS — N400 Enlarged prostate without lower urinary tract symptoms: Secondary | ICD-10-CM

## 2021-07-11 DIAGNOSIS — I4510 Unspecified right bundle-branch block: Secondary | ICD-10-CM | POA: Insufficient documentation

## 2021-07-11 DIAGNOSIS — Z952 Presence of prosthetic heart valve: Secondary | ICD-10-CM | POA: Insufficient documentation

## 2021-07-11 DIAGNOSIS — I110 Hypertensive heart disease with heart failure: Secondary | ICD-10-CM | POA: Insufficient documentation

## 2021-07-11 DIAGNOSIS — I351 Nonrheumatic aortic (valve) insufficiency: Secondary | ICD-10-CM | POA: Diagnosis not present

## 2021-07-11 DIAGNOSIS — I509 Heart failure, unspecified: Secondary | ICD-10-CM

## 2021-07-11 DIAGNOSIS — Z7901 Long term (current) use of anticoagulants: Secondary | ICD-10-CM | POA: Diagnosis not present

## 2021-07-11 DIAGNOSIS — I4892 Unspecified atrial flutter: Secondary | ICD-10-CM | POA: Insufficient documentation

## 2021-07-11 DIAGNOSIS — E785 Hyperlipidemia, unspecified: Secondary | ICD-10-CM | POA: Insufficient documentation

## 2021-07-11 DIAGNOSIS — E119 Type 2 diabetes mellitus without complications: Secondary | ICD-10-CM | POA: Diagnosis not present

## 2021-07-11 DIAGNOSIS — E118 Type 2 diabetes mellitus with unspecified complications: Secondary | ICD-10-CM

## 2021-07-11 DIAGNOSIS — Z954 Presence of other heart-valve replacement: Secondary | ICD-10-CM

## 2021-07-11 DIAGNOSIS — Z79899 Other long term (current) drug therapy: Secondary | ICD-10-CM | POA: Insufficient documentation

## 2021-07-11 DIAGNOSIS — R55 Syncope and collapse: Secondary | ICD-10-CM

## 2021-07-11 DIAGNOSIS — I5022 Chronic systolic (congestive) heart failure: Secondary | ICD-10-CM | POA: Insufficient documentation

## 2021-07-11 DIAGNOSIS — Q2543 Congenital aneurysm of aorta: Secondary | ICD-10-CM | POA: Insufficient documentation

## 2021-07-11 LAB — BASIC METABOLIC PANEL
Anion gap: 8 (ref 5–15)
BUN: 11 mg/dL (ref 8–23)
CO2: 26 mmol/L (ref 22–32)
Calcium: 8.9 mg/dL (ref 8.9–10.3)
Chloride: 102 mmol/L (ref 98–111)
Creatinine, Ser: 0.77 mg/dL (ref 0.61–1.24)
GFR, Estimated: >60 mL/min (ref >60)
Glucose, Bld: 157 mg/dL — ABNORMAL HIGH (ref 70–99)
Potassium: 4.1 mmol/L (ref 3.5–5.1)
Sodium: 136 mmol/L (ref 135–145)

## 2021-07-11 LAB — ECHOCARDIOGRAM COMPLETE
AR max vel: 5.77 cm2
AV Area VTI: 5.58 cm2
AV Area mean vel: 5.05 cm2
AV Mean grad: 2 mmHg
AV Peak grad: 3.4 mmHg
Ao pk vel: 0.93 m/s
Area-P 1/2: 2.73 cm2
S' Lateral: 4.5 cm
Single Plane A4C EF: 42.1 %

## 2021-07-11 LAB — HEMOGLOBIN A1C
Hgb A1c MFr Bld: 7.7 % — ABNORMAL HIGH (ref 4.8–5.6)
Mean Plasma Glucose: 174.29 mg/dL

## 2021-07-11 LAB — PSA: Prostatic Specific Antigen: 1.1 ng/mL (ref 0.00–4.00)

## 2021-07-11 MED ORDER — SPIRONOLACTONE 25 MG PO TABS: 25.0000 mg | ORAL_TABLET | Freq: Every day | ORAL | 6 refills | Status: DC

## 2021-07-11 NOTE — Progress Notes (Signed)
Zio patch placed onto patient.  All instructions and information reviewed with patient, they verbalize understanding with no questions. 

## 2021-07-11 NOTE — Progress Notes (Signed)
  Echocardiogram 2D Echocardiogram has been performed.  [name] [name] 07/11/2021, 10:14 AM

## 2021-07-11 NOTE — Patient Instructions (Signed)
Medication Changes:  INCREASE spironolactone to 25 mg daily.  Lab Work:  Labs done today, your results will be available in MyChart, we will contact you for abnormal readings.  Repeat labs in 10 days.   Testing/Procedures:  Your provider has recommended that  you wear a Zio Patch for 14 days.  This monitor will record your heart rhythm for our review.  IF you have any symptoms while wearing the monitor please press the button.  If you have any issues with the patch or you notice a red or orange light on it please call the company at [phone].  Once you remove the patch please mail it back to the company as soon as possible so we can get the results.   Referrals:  You have been referred to [facility] for a colonoscopy. The office will call you with an appointment.   Special Instructions // Education:    Follow-Up in:  3 months  At the [facility], you and your health needs are our priority. We have a designated team specialized in the treatment of Heart Failure. This Care Team includes your primary Heart Failure Specialized Cardiologist (physician), Advanced Practice Providers (APPs- Physician Assistants and Nurse Practitioners), and Pharmacist who all work together to provide you with the care you need, when you need it.   You may see any of the following providers on your designated Care Team at your next follow up:  Dr Glori Bickers Dr Haynes Kerns, NP Lyda Jester, [place] [facility] [place], [place] Audry Riles, PharmD   Please be sure to bring in all your medications bottles to every appointment.   Need to Contact us:  If you have any questions or concerns before your next appointment please send [place] a message through [place] or call our office at [phone].    TO LEAVE A MESSAGE FOR THE NURSE SELECT OPTION 2, PLEASE LEAVE A MESSAGE INCLUDING: YOUR NAME DATE OF BIRTH CALL BACK NUMBER REASON FOR  CALL**this is important as we prioritize the call backs  YOU WILL RECEIVE A CALL BACK THE SAME DAY AS LONG AS YOU CALL BEFORE 4:00 PM

## 2021-07-12 DIAGNOSIS — R55 Syncope and collapse: Secondary | ICD-10-CM | POA: Diagnosis not present

## 2021-07-12 NOTE — Progress Notes (Signed)
Patient ID: LAYKEN DOENGES, male   DOB: 24-Feb-1951, 70 y.o.   MRN: [id number] PCP: Dr. Birdie Riddle Cardiology: Dr. Aundra Dubin  70 y.o. with history of aortic insufficiency s/p Ross procedure and aortic root aneurysm presents for followup of Ross procedure.  He has a left-sided brachial plexopathy of uncertain etiology and has atrophy of his left shoulder and upper arm.  He developed weakness in his right hand as well as occasional vertigo-type spells.  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    Echo in 8/18 showed no regurgitation or stenosis of autograft in aortic position, bioprosthetic pulmonic valve with mild pulmonic stenosis, mean gradient 10 mmHg.  EF was read as 40-45%, which is lower than in the past.  MRA chest 3/19 showed 5.7 cm dilation of pulmonary autograft at level of sinuses of Valsalva, MRA chest in 6/20 showed 6 cm dilation of the pulmonary autograft at the level of the sinuses of valsalva.  He is being followed for aortic root aneurysm by Dr. Servando Snare.   MRA chest in 6/21 showed aortic root 5.4 cm, no change from prior. Echo in 8/21 showed EF 50%, RV mildly enlarged with mildly decreased systolic function, pulmonary valve autograft in aortic position with mild regurgitation but no stenosis, bioprosthetic pulmonary valve with no significant stenosis or regurgitation, ascending aorta 5.3 cm.   Patient was noted to be in atrial flutter in 8/21, rate 110s.  Looking back, his HR was elevated in the 110s at physician appointments back to 5/21 (no ECGs).  I took him for TEE-guided DCCV in 8/21.  TEE showed EF 40%, mildly decreased RV function, aortic valve replaced by pulmonary autograft with mild AI and no AS, bioprosthetic PV looked  stable.  No LA appendage thrombus, he underwent DCCV.  After DCCV, he developed exertional dyspnea and was started on Lasix.   Echo in 10/21 showed EF improved to 55-60%, mild RV dilation with mildly decreased systolic function, bioprosthetic PV with peak gradient 31 mmHg (mild PS), pulmonary autograft in aortic position with mild regurgitation and no stenosis.  Sleep study showed severe OSA but he does not want to use CPAP.   MRA chest in 6/22 showed stable 5.4 cm aortic root.  Echo was done today and reviewed, EF 50%, mild LV dilation, mildly decreased RV systolic function, pulmonary autograft in aortic position with mild regurgitation and no stenosis, bioprosthetic pulmonary valve with no significant stenosis or regurgitation, PASP 28 mmHg.   He remains in NSR today.  Weight is up 5 lbs.  He remains in NSR, no palpitations.  BP has been controlled.  He still works, has a window washing business.  No significant exertional dyspnea or chest pain.  He has noted spells over the last week that will last 10-12 seconds when "time stops."  No loss of consciousness but he will feel very foggy.      ECG (personally reviewed): NSR, 1st degree AVB 264 msec, RBBB.   Labs (2/14): K 3.7, creatinine 0.9, LDL 63, HDL 29 Labs (3/14): BNP 36 Labs (2/15): K 3.7, creatinine 0.8, LDL 61, HDL 30 Labs (4/16): K 3.7, creatinine 0.8, LDL 51, HDL 34 Labs (5/16): HCT 44.9 Labs (11/17): LDL 74, HDL 40, K 4.1, creatinine 0.77  Labs (11/18): LDL 75, HDL 35 Labs (2/19): K 4.2, creatinine 0.88 Labs (5/20): LDL 60, K 4.7, creatinine 0.81 Labs (5/21): TSH normal, LDL 71, K 4.7, creatinine 0.78 Labs (8/21): K 4.3, creatinine 0.75 => 1.02 Labs (5/21): LDL 70 Labs (9/21): K 4, creatinine 0.88 Labs (12/21): LDL 110, K 4.1, creatinine 0.84 Labs (2/22): K 4.8, creatinine 0.9 Labs (4/22): LDL 70, HDL 41 Labs (6/22): LDL 64, K 4.4, creatinine 0.76, hgb 14.6  PMH: 1. Aortic insufficiency: s/p Ross procedure in 1994 in [place].    - Echo (4/11) with hypokinetic basal septum (likely post-surgical), EF 50%, mild LVH, s/p Ross procedure with native pulmonic valve in aortic position with mild aortic insufficiency and bioprosthetic pulmonic valve with no pulmonic insufficiency, mild MR, aortic upper normal in size.  - Echo (4/13) with EF 55%, mild LVH, mild AI, mild MR, mild RV dilation, bioprosthetic pulmonic valve with peak pressure 25 mmHg.   - Echo (3/14): Mild LV dilation, mild LVH, EF 60%, pulmonary valve in aortic position (s/p Ross) with mild AI and mildly dilated ascending aorta to 4.3 cm, mildly dilated RV with normal systolic function, bioprosthetic PVR with mean gradient 23 mmHg.  Echo (3/15) with EF 55-60%, mild LVH, mild AI with no AS, bioprosthetic pulmonic valve with peak gradient 31 mmHg, ascending aorta 4.4 cm.   - Cardiac MRI/MRA chest (5/15) with mild regurgitation of autograft aortic valve and mild regurgitation of homograft pulmonic valve, dilation of aortic root at sinuses of valsalva (4.8 cm), EF 60%, no LGE, mild RV dilation with normal RV systolic function.   - Echo (5/16) with EF 55-60%, mild autograft AI, mild MR, mildly decreased RV systolic function, biatrial enlargement, RVSP 35 mmHg, trivially increased gradient across bioprosthetic pulmonic valve (no progression).  - Echo (8/17): EF 55-60%, pulmonary autograft in aortic valve position, mild AI, bioprosthetic pulmonary valve with mild PS (peak 22 mmHg), moderate RV dilation with normal systolic function.  - Echo (8/18): EF 40-45%, mildly increased gradient across bioprosthetic pulmonary valve (mean 10 mmHg), s/p Ross procedure with no stenosis or significant regurgitation of pulmonary autograft in aortic position.  - Echo (8/21): EF 50%, RV mildly enlarged with mildly decreased systolic function, pulmonary valve autograft in aortic position with mild regurgitation but no stenosis, bioprosthetic pulmonary valve with no significant stenosis or regurgitation,  ascending aorta 5.3 cm.  - TEE (8/21): EF 40%, mildly decreased RV function, aortic valve replaced by pulmonary autograft with mild AI and no AS, bioprosthetic PV looked stable. - Echo (10/21): EF improved to 55-60%, mild RV dilation with mildly decreased systolic function, bioprosthetic PV with peak gradient 31 mmHg (mild PS), pulmonary autograft in aortic position with mild regurgitation and not stenosis.  - Echo (12/22): EF 50%, mild LV dilation, mildly decreased RV systolic function, pulmonary autograft in aortic position with mild regurgitation and no stenosis, bioprosthetic pulmonary valve with no significant stenosis or regurgitation, PASP 28 mmHg. 2. Post-operative atrial fibrillation after heart surgery.  3. Normal left heart cath prior to 1994 heart surgery.  4. Left brachial plexopathy with left shoulder muscle atrophy.  Uncertain etiology.  5. BPH 6. Hyperlipidemia.  7. Cervical spinal stenosis with radiculopathy.   8. 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  9. ETT-Sestamibi (3/14): No ischemia or infarction.  10. Carotid dopplers (6/14): mild disease only.  11. Sinus of valsalva aneurysm: MRA chest 2/18 with dilation of pulmonary autograft at level  of sinuses of Valsalva to 5.4 cm when measured into left cusp.  - MRA chest (3/19): Saccular dilatation of the left and right sinuses of valsalva, 5.7 cm aortic root diameter.   - MRA chest (6/20): 6 cm dilated sinuses of valsalva, 4.6 cm ascending aorta.  - MRA chest (6/21): aortic root 5.4 cm, no change from prior - MRA chest (6/22): aortic root 5.4 cm.  12. Elevated left hemidiaphragm.  13. Atrial flutter: Atypical.  Noted in 8/21 => underwent DCCV to NSR. Marland Kitchen   FH: Prostate CA, HTN  SH: Lives in [place], owns a window washing business, married, nonsmoker.   ROS: All systems reviewed and negative except as per HPI.    Current Outpatient Medications   Medication Sig Dispense Refill   amLODipine (NORVASC) 2.5 MG tablet Take 1 tablet (2.5 mg total) by mouth daily. 30 tablet 11   apixaban (ELIQUIS) 5 MG TABS tablet Take 1 tablet (5 mg total) by mouth 2 (two) times daily. 60 tablet 11   cetirizine (ZYRTEC) 10 MG tablet TAKE 1 TABLET(10 MG) BY MOUTH DAILY 90 tablet 0   finasteride (PROSCAR) 5 MG tablet TAKE 1 TABLET(5 MG) BY MOUTH DAILY 90 tablet 1   fluticasone (FLONASE) 50 MCG/ACT nasal spray Place 2 sprays into both nostrils as needed for allergies or rhinitis.     furosemide (LASIX) 20 MG tablet Take 1 tablet (20 mg total) by mouth every other day. 15 tablet 11   metFORMIN (GLUCOPHAGE) 500 MG tablet TAKE 1 TABLET(500 MG) BY MOUTH DAILY 90 tablet 0   metoprolol succinate (TOPROL-XL) 100 MG 24 hr tablet Take 1 tablet (100 mg total) by mouth 2 (two) times daily. 180 tablet 3   pantoprazole (PROTONIX) 40 MG tablet Take 1 tablet (40 mg total) by mouth daily. 90 tablet 1   rosuvastatin (CRESTOR) 40 MG tablet Take 1 tablet (40 mg total) by mouth daily. 90 tablet 3   sacubitril-valsartan (ENTRESTO) 97-103 MG Take 1 tablet by mouth 2 (two) times daily. 180 tablet 3   spironolactone (ALDACTONE) 25 MG tablet Take 1 tablet (25 mg total) by mouth daily. 30 tablet 6   No current facility-administered medications for this encounter.    BP 130/82   Pulse 62   Wt 110 kg (242 lb 6.4 oz)   SpO2 99%   BMI 30.30 kg/m  General: NAD Neck: No JVD, no thyromegaly or thyroid nodule.  Lungs: Clear to auscultation bilaterally with normal respiratory effort. CV: Nondisplaced PMI.  Heart regular S1/S2 with widely split S2, no S3/S4, 2/6 early SEM RUSB.  No peripheral edema.  No carotid bruit.  Normal pedal pulses.  Abdomen: Soft, nontender, no hepatosplenomegaly, no distention.  Skin: Intact without lesions or rashes.  Neurologic: Alert and oriented x 3.  Psych: Normal affect. Extremities: No clubbing or cyanosis.  HEENT: Normal.   Assessment/Plan: 1.  Atrial flutter:  Likely related to prior cardiac surgery.  He had peri-operative AF at time of heart surgery but had not been noted to have recurrent arrhythmias until 8/21.  In 8/21, he was noted to be in atrial flutter with RVR, based on review of HR over the past few months, he had probably been in atrial flutter since at least 5/21. He had DCCV in 8/21 and is in NSR today. He saw Dr. Rayann Heman, ablation offered if flutter recurs.  - Continue Eliquis 5 mg bid.  - Continue Toprol XL 100 mg bid.  2.  History of Ross Procedure: Echo in 12/22 (  today) showed pulmonary autograft in aortic position with mild AI, no AS.  The bioprosthetic pulmonary valve appeared normal.   - Needs endocarditis prophylaxis with dental work.   3.  Aortic root aneurym: This not uncommonly accompanies Ross procedure.  The pulmonary autograft at sinuses of valsalva measured 5.4 cm by MRA chest in 6/22.  Followed by [facility]. - Per Dr. Roxan Hockey, would hold off on elective repair unless there is significant AI.   - Continue blood pressure control, goal SBP 120s or lower.  4. HTN: Given aneurysm, need to make sure that BP is well-controlled.  - Continue amlodipine, Entresto, spironolactone, Toprol XL.   5. Hyperlipidemia: Continue current Crestor, good lipids in 4/22.  6. Chronic systolic CHF: TEE in 0/03 in setting of long-standing atrial flutter with RVR showed EF down to 40%. 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  He was cardioverted in 8/21 but developed exertional dyspnea with volume overload afterwards.  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  Echo today showed EF 50% with mild RV dysfunction. He is not volume overloaded on exam, NYHA class I-II symptoms now.     - Continue Lasix 20 mg qod. BMET today.  - Continue Toprol XL.  - Continue Entresto 97/103 bid.  - Increase spironolactone to 25 mg daily, BMET in 10 days. 7. OSA: Severe by sleep study.  Refuses CPAP.  I suspect this is a major  contributor to his daytime sleepiness and fatigue.  8. Diabetes: on metformin.  9. Lightheaded "spells": Patient has baseline 1st degree AVB with RBBB, ?transient higher grade heart block.  - I will have him wear Zio monitor (AT) for 1 month.   Followup in 3 months  Loralie Champagne 07/12/2021

## 2021-07-14 NOTE — Progress Notes (Signed)
Referral faxed to [facility] for colonoscopy. Fax [phone]

## 2021-07-14 NOTE — Addendum Note (Signed)
Encounter addended by: Jerl Mina, RN on: 07/14/2021 9:31 AM  Actions taken: Clinical Note Signed

## 2021-07-17 ENCOUNTER — Ambulatory Visit (INDEPENDENT_AMBULATORY_CARE_PROVIDER_SITE_OTHER): Payer: HMO

## 2021-07-17 DIAGNOSIS — Z0000 Encounter for general adult medical examination without abnormal findings: Secondary | ICD-10-CM

## 2021-07-17 NOTE — Progress Notes (Signed)
Subjective:   [name] [name] is a 70 y.o. male who presents for an Subsequent Medicare Annual Wellness Visit.  I connected with [name] [name] today by telephone and verified that I am speaking with the correct person using two identifiers. Location patient: home Location provider: work Persons participating in the virtual visit: patient, provider.   I discussed the limitations, risks, security and privacy concerns of performing an evaluation and management service by telephone and the availability of in person appointments. I also discussed with the patient that there may be a patient responsible charge related to this service. The patient expressed understanding and verbally consented to this telephonic visit.    Interactive audio and video telecommunications were attempted between this provider and patient, however failed, due to patient having technical difficulties OR patient did not have access to video capability.  We continued and completed visit with audio only.    Review of Systems     Cardiac Risk Factors include: advanced age (>70men, >86 women);diabetes mellitus;dyslipidemia;male gender;hypertension     Objective:    Today's Vitals   There is no height or weight on file to calculate BMI.  Advanced Directives 07/17/2021 04/19/2021 07/15/2020 03/14/2020 06/10/2017 03/29/2014  Does Patient Have a Medical Advance Directive? Yes Yes Yes Yes Yes Yes  Type of Paramedic of [place];Living will Living will;Healthcare Power of [place];Living will Healthcare Power of [place];Living will [place];Living will  Does patient want to make changes to medical advance directive? - No - Patient declined - - - -  Copy of [place] in Chart? No - copy requested No - copy requested No - copy requested Yes - validated most recent copy scanned in chart (See row information) No -  copy requested No - copy requested  Would patient like information on creating a medical advance directive? - - - - - No - patient declined information    Current Medications (verified) Outpatient Encounter Medications as of 07/17/2021  Medication Sig   amLODipine (NORVASC) 2.5 MG tablet Take 1 tablet (2.5 mg total) by mouth daily.   apixaban (ELIQUIS) 5 MG TABS tablet Take 1 tablet (5 mg total) by mouth 2 (two) times daily.   cetirizine (ZYRTEC) 10 MG tablet TAKE 1 TABLET(10 MG) BY MOUTH DAILY   finasteride (PROSCAR) 5 MG tablet TAKE 1 TABLET(5 MG) BY MOUTH DAILY   fluticasone (FLONASE) 50 MCG/ACT nasal spray Place 2 sprays into both nostrils as needed for allergies or rhinitis.   furosemide (LASIX) 20 MG tablet Take 1 tablet (20 mg total) by mouth every other day.   metFORMIN (GLUCOPHAGE) 500 MG tablet TAKE 1 TABLET(500 MG) BY MOUTH DAILY   metoprolol succinate (TOPROL-XL) 100 MG 24 hr tablet Take 1 tablet (100 mg total) by mouth 2 (two) times daily.   pantoprazole (PROTONIX) 40 MG tablet Take 1 tablet (40 mg total) by mouth daily.   rosuvastatin (CRESTOR) 40 MG tablet Take 1 tablet (40 mg total) by mouth daily.   sacubitril-valsartan (ENTRESTO) 97-103 MG Take 1 tablet by mouth 2 (two) times daily.   spironolactone (ALDACTONE) 25 MG tablet Take 1 tablet (25 mg total) by mouth daily.   No facility-administered encounter medications on file as of 07/17/2021.    Allergies (verified) Penicillins, Quinidine, and Quinolones   History: Past Medical History:  Diagnosis Date   Acute gastritis    Aortic insufficiency    s/p aortic valve replacement in  1994   CHF (congestive heart failure) (HCC)    Diabetes mellitus without complication ([place])    Early satiety    Esophageal stricture    GERD (gastroesophageal reflux disease)    Headache syndrome 10/18/2018   Hiatal hernia    Hyperlipidemia    Neuropathy    left brachial plexus   Pain, dental    Past Surgical History:  Procedure  Laterality Date   AORTIC VALVE REPLACEMENT  1994   CARDIOVERSION N/A 03/14/2020   Procedure: CARDIOVERSION;  Surgeon: [name], MD;  Location: [facility];  Service: Cardiovascular;  Laterality: N/A;   GANGLION CYST EXCISION  JAN 2002   HIATAL HERNIA REPAIR  [place] 2002   TEE WITHOUT CARDIOVERSION N/A 03/14/2020   Procedure: TRANSESOPHAGEAL ECHOCARDIOGRAM (TEE);  Surgeon: [name], MD;  Location: [facility] ENDOSCOPY;  Service: Cardiovascular;  Laterality: N/A;   TONSILLECTOMY     VASECTOMY  DEC 2002   Family History  Problem Relation Age of Onset   Lung cancer Father        hx of smoking    Hypertension Other    Prostate cancer Other    Leukemia Brother        Passed at age of 32   Colon cancer Neg Hx    Pancreatic cancer Neg Hx    Rectal cancer Neg Hx    Stomach cancer Neg Hx    Social History   Socioeconomic History   Marital status: Married    Spouse name: Belenda Cruise    Number of children: 3   Years of education: Not on file   Highest education level: Master's degree (e.g., MA, MS, MEng, MEd, MSW, MBA)  Occupational History   Occupation: retired  Tobacco Use   Smoking status: Never   Smokeless tobacco: Never  Vaping Use   Vaping Use: Never used  Substance and Sexual Activity   Alcohol use: No   Drug use: No   Sexual activity: Not on file  Other Topics Concern   Not on file  Social History Narrative   Caffeine 1-2 cups daily    Right handed   Live in [place] with spouse Belenda Cruise    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   Social Determinants of Radio broadcast assistant Strain: Low Risk    Difficulty of Paying Living Expenses: Not hard at all  Food Insecurity: No Food Insecurity   Worried About Charity fundraiser in the Last Year: Never true   Arboriculturist in the Last Year: Never true  Transportation Needs: No Transportation Needs   Lack of Transportation (Medical): No   Lack of Transportation (Non-Medical): No  Physical  Activity: Insufficiently Active   Days of Exercise per Week: 4 days   Minutes of Exercise per Session: 30 min  Stress: No Stress Concern Present   Feeling of Stress : Not at all  Social Connections: Moderately Integrated   Frequency of Communication with Friends and Family: Twice a week   Frequency of Social Gatherings with Friends and Family: Twice a week   Attends Religious Services: More than 4 times per year   Active Member of [employer] or Organizations: No   Attends Music therapist: Never   Marital Status: Married    Tobacco Counseling Counseling given: Not Answered   Clinical Intake:  Pre-visit preparation completed: Yes  Pain : No/denies pain     Nutritional Risks: None Diabetes: Yes CBG done?: No Did pt. bring  in CBG monitor from home?: No  How often do you need to have someone help you when you read instructions, pamphlets, or other written materials from your doctor or pharmacy?: 1 - Never What is the last grade level you completed in school?: masters  Diabetic?yes Nutrition Risk Assessment:  Has the patient had any N/V/D within the last 2 months?  No  Does the patient have any non-healing wounds?  No  Has the patient had any unintentional weight loss or weight gain?  No   Diabetes:  Is the patient diabetic?  Yes  If diabetic, was a CBG obtained today?  No  Did the patient bring in their glucometer from home?  No  How often do you monitor your CBG's?   Ever  Dealer and Diabetes Management:  Are you having any financial strains with the device, your supplies or your medication? No .  Does the patient want to be seen by Chronic Care Management for management of their diabetes?  No  Would the patient like to be referred to a Nutritionist or for Diabetic Management?  No   Diabetic Exams:  Diabetic Eye Exam: Completed 04/2021 Diabetic Foot Exam: Overdue, Pt has been advised about the importance in completing this exam. Pt is scheduled  for diabetic foot exam on next office visit .   Interpreter Needed?: No  Information entered by :: [place] of Daily Living In your present state of health, do you have any difficulty performing the following activities: 07/17/2021 01/20/2021  Hearing? N N  Vision? N N  Difficulty concentrating or making decisions? N N  Walking or climbing stairs? N N  Dressing or bathing? N N  Doing errands, shopping? N N  Preparing Food and eating ? N -  Using the Toilet? N -  In the past six months, have you accidently leaked urine? N -  Do you have problems with loss of bowel control? N -  Managing your Medications? N -  Managing your Finances? N -  Housekeeping or managing your Housekeeping? N -  Some recent data might be hidden    Patient Care Team: [name], MD as PCP - General [name], MD as Consulting Physician (Cardiology) Grace Isaac, MD (Inactive) as Consulting Physician (Cardiothoracic Surgery) Rana Snare, MD (Inactive) as Consulting Physician (Urology) [place], Jennefer Bravo, MD as Referring Physician (Dermatology) Melissa Montane, MD as Consulting Physician (Otolaryngology)  Indicate any recent Medical Services you may have received from other than [facility] providers in the past year (date may be approximate).     Assessment:   This is a routine wellness examination for [place].  Hearing/Vision screen Vision Screening - Comments:: Annual eye exams wears glasses   Dietary issues and exercise activities discussed: Current Exercise Habits: Home exercise routine, Type of exercise: walking, Time (Minutes): 40, Frequency (Times/Week): 4, Weekly Exercise (Minutes/Week): 160, Intensity: Mild, Exercise limited by: None identified   Goals Addressed               This Visit's Progress     patient (pt-stated)   On track     Maintain current health by staying active.        Depression Screen PHQ 2/9 Scores 07/17/2021 07/17/2021  01/20/2021 07/18/2020 07/15/2020 06/17/2020 12/14/2019  PHQ - 2 Score 0 0 0 0 0 0 0  PHQ- 9 Score - - 0 0 - 0 0    Fall Risk Fall Risk  07/17/2021 01/20/2021 07/18/2020 07/15/2020 06/17/2020  Falls in the past year? 0 0 0 0 0  Number falls in past yr: 0 0 0 0 0  Injury with Fall? 0 0 0 0 0  Risk for fall due to : - No Fall Risks No Fall Risks - No Fall Risks  Follow up Falls evaluation completed - - Falls prevention discussed -    FALL RISK PREVENTION PERTAINING TO THE HOME:  Any stairs in or around the home? Yes  If so, are there any without handrails? No  Home free of loose throw rugs in walkways, pet beds, electrical cords, etc? Yes  Adequate lighting in your home to reduce risk of falls? Yes   ASSISTIVE DEVICES UTILIZED TO PREVENT FALLS:  Life alert? No  Use of a cane, walker or w/c? No  Grab bars in the bathroom? No  Shower chair or bench in shower? No  Elevated toilet seat or a handicapped toilet? No    Cognitive Function:  Normal cognitive status assessed by direct observation by this Nurse Health Advisor. No abnormalities found.        Immunizations Immunization History  Administered Date(s) Administered   Fluad Quad(high Dose 65+) 06/16/2019, 07/08/2020   Influenza Split 07/07/2011   Influenza Whole 05/10/2008, 05/29/2010   Influenza, High Dose Seasonal PF 06/14/2018   Influenza, Seasonal, Injecte, Preservative Fre 07/12/2012   Influenza,inj,Quad PF,6+ Mos 05/25/2013, 05/31/2014, 06/03/2015, 06/08/2016, 04/29/2017   Moderna Sars-Covid-2 Vaccination 09/25/2019, 10/24/2019, 07/01/2020   Pneumococcal Conjugate-13 06/14/2018   Pneumococcal Polysaccharide-23 06/16/2019   Tdap 07/12/2012   Zoster Recombinat (Shingrix) 09/08/2012   Zoster, Live 09/08/2012    TDAP status: Up to date  Flu Vaccine status: Up to date  Pneumococcal vaccine status: Up to date  Covid-19 vaccine status: Completed vaccines  Qualifies for Shingles Vaccine? Yes   Zostavax completed  Yes   Shingrix Completed?: Yes  Screening Tests Health Maintenance  Topic Date Due   COVID-19 Vaccine (4 - Booster for Moderna series) 08/26/2020   INFLUENZA VACCINE  03/03/2021   Zoster Vaccines- Shingrix (2 of 2) 08/02/2021 (Originally 11/03/2012)   HEMOGLOBIN A1C  01/09/2022   FOOT EXAM  01/20/2022   OPHTHALMOLOGY EXAM  04/17/2022   TETANUS/TDAP  07/12/2022   COLONOSCOPY (Pts 45-80yrs Insurance coverage will need to be confirmed)  05/14/2024   Pneumonia Vaccine 56+ Years old  Completed   Hepatitis C Screening  Completed   HPV VACCINES  Aged Out    Health Maintenance  Health Maintenance Due  Topic Date Due   COVID-19 Vaccine (4 - Booster for Moderna series) 08/26/2020   INFLUENZA VACCINE  03/03/2021    Colorectal cancer screening: Type of screening: Colonoscopy. Completed 05/14/2014. Repeat every 10 years  Lung Cancer Screening: (Low Dose CT Chest recommended if Age 73-80 years, 30 pack-year currently smoking OR have quit w/in 15years.) does not qualify.   Lung Cancer Screening Referral: n/a  Additional Screening:  Hepatitis C Screening: does not qualify; Completed 01/20/2021  Vision Screening: Recommended annual ophthalmology exams for early detection of glaucoma and other disorders of the eye. Is the patient up to date with their annual eye exam?  Yes  Who is the provider or what is the name of the office in which the patient attends annual eye exams? Dr.Gould  If pt is not established with a provider, would they like to be referred to a provider to establish care? No .   Dental Screening: Recommended annual dental exams for proper oral hygiene  Community Resource Referral / Chronic Care  Management: CRR required this visit?  No   CCM required this visit?  No      Plan:     I have personally reviewed and noted the following in the patients chart:   Medical and social history Use of alcohol, tobacco or illicit drugs  Current medications and supplements  including opioid prescriptions. Patient is not currently taking opioid prescriptions. Functional ability and status Nutritional status Physical activity Advanced directives List of other physicians Hospitalizations, surgeries, and ER visits in previous 12 months Vitals Screenings to include cognitive, depression, and falls Referrals and appointments  In addition, I have reviewed and discussed with patient certain preventive protocols, quality metrics, and best practice recommendations. A written personalized care plan for preventive services as well as general preventive health recommendations were provided to patient.     Randel Pigg, LPN   [id number]   Nurse Notes: none

## 2021-07-17 NOTE — Patient Instructions (Signed)
[name] [name] , Thank you for taking time to come for your Medicare Wellness Visit. I appreciate your ongoing commitment to your health goals. Please review the following plan we discussed and let me know if I can assist you in the future.   Screening recommendations/referrals: Colonoscopy: 05/14/2014  due 2025 Recommended yearly ophthalmology/optometry visit for glaucoma screening and checkup Recommended yearly dental visit for hygiene and checkup  Vaccinations: Influenza vaccine: completed  Pneumococcal vaccine: completed  Tdap vaccine: 07/12/2012 Shingles vaccine: completed     Advanced directives: will provide copies   Conditions/risks identified: none  Next appointment: 08/05/2021  0900am  Dr. Birdie Riddle   Preventive Care 30 Years and Older, Male Preventive care refers to lifestyle choices and visits with your health care provider that can promote health and wellness. What does preventive care include? A yearly physical exam. This is also called an annual well check. Dental exams once or twice a year. Routine eye exams. Ask your health care provider how often you should have your eyes checked. Personal lifestyle choices, including: Daily care of your teeth and gums. Regular physical activity. Eating a healthy diet. Avoiding tobacco and drug use. Limiting alcohol use. Practicing safe sex. Taking low doses of aspirin every day. Taking vitamin and mineral supplements as recommended by your health care provider. What happens during an annual well check? The services and screenings done by your health care provider during your annual well check will depend on your age, overall health, lifestyle risk factors, and family history of disease. Counseling  Your health care provider may ask you questions about your: Alcohol use. Tobacco use. Drug use. Emotional well-being. Home and relationship well-being. Sexual activity. Eating habits. History of falls. Memory and ability to  understand (cognition). Work and work Statistician. Screening  You may have the following tests or measurements: Height, weight, and BMI. Blood pressure. Lipid and cholesterol levels. These may be checked every 5 years, or more frequently if you are over 43 years old. Skin check. Lung cancer screening. You may have this screening every year starting at age 68 if you have a 30-pack-year history of smoking and currently smoke or have quit within the past 15 years. Fecal occult blood test (FOBT) of the stool. You may have this test every year starting at age 13. Flexible sigmoidoscopy or colonoscopy. You may have a sigmoidoscopy every 5 years or a colonoscopy every 10 years starting at age 8. Prostate cancer screening. Recommendations will vary depending on your family history and other risks. Hepatitis C blood test. Hepatitis B blood test. Sexually transmitted disease (STD) testing. Diabetes screening. This is done by checking your blood sugar (glucose) after you have not eaten for a while (fasting). You may have this done every 1-3 years. Abdominal aortic aneurysm (AAA) screening. You may need this if you are a current or former smoker. Osteoporosis. You may be screened starting at age 60 if you are at high risk. Talk with your health care provider about your test results, treatment options, and if necessary, the need for more tests. Vaccines  Your health care provider may recommend certain vaccines, such as: Influenza vaccine. This is recommended every year. Tetanus, diphtheria, and acellular pertussis (Tdap, Td) vaccine. You may need a Td booster every 10 years. Zoster vaccine. You may need this after age 74. Pneumococcal 13-valent conjugate (PCV13) vaccine. One dose is recommended after age 41. Pneumococcal polysaccharide (PPSV23) vaccine. One dose is recommended after age 76. Talk to your health care provider about which screenings  and vaccines you need and how often you need them. This  information is not intended to replace advice given to you by your health care provider. Make sure you discuss any questions you have with your health care provider. Document Released: 08/16/2015 Document Revised: 04/08/2016 Document Reviewed: 05/21/2015 Elsevier Interactive Patient Education  2017 [place] Prevention in the Home Falls can cause injuries. They can happen to people of all ages. There are many things you can do to make your home safe and to help prevent falls. What can I do on the outside of my home? Regularly fix the edges of walkways and driveways and fix any cracks. Remove anything that might make you trip as you walk through a door, such as a raised step or threshold. Trim any bushes or trees on the path to your home. Use bright outdoor lighting. Clear any walking paths of anything that might make someone trip, such as rocks or tools. Regularly check to see if handrails are loose or broken. Make sure that both sides of any steps have handrails. Any raised decks and porches should have guardrails on the edges. Have any leaves, snow, or ice cleared regularly. Use sand or salt on walking paths during winter. Clean up any spills in your garage right away. This includes oil or grease spills. What can I do in the bathroom? Use night lights. Install grab bars by the toilet and in the tub and shower. Do not use towel bars as grab bars. Use non-skid mats or decals in the tub or shower. If you need to sit down in the shower, use a plastic, non-slip stool. Keep the floor dry. Clean up any water that spills on the floor as soon as it happens. Remove soap buildup in the tub or shower regularly. Attach bath mats securely with double-sided non-slip rug tape. Do not have throw rugs and other things on the floor that can make you trip. What can I do in the bedroom? Use night lights. Make sure that you have a light by your bed that is easy to reach. Do not use any sheets or  blankets that are too big for your bed. They should not hang down onto the floor. Have a firm chair that has side arms. You can use this for support while you get dressed. Do not have throw rugs and other things on the floor that can make you trip. What can I do in the kitchen? Clean up any spills right away. Avoid walking on wet floors. Keep items that you use a lot in easy-to-reach places. If you need to reach something above you, use a strong step stool that has a grab bar. Keep electrical cords out of the way. Do not use floor polish or wax that makes floors slippery. If you must use wax, use non-skid floor wax. Do not have throw rugs and other things on the floor that can make you trip. What can I do with my stairs? Do not leave any items on the stairs. Make sure that there are handrails on both sides of the stairs and use them. Fix handrails that are broken or loose. Make sure that handrails are as long as the stairways. Check any carpeting to make sure that it is firmly attached to the stairs. Fix any carpet that is loose or worn. Avoid having throw rugs at the top or bottom of the stairs. If you do have throw rugs, attach them to the floor with carpet tape.  Make sure that you have a light switch at the top of the stairs and the bottom of the stairs. If you do not have them, ask someone to add them for you. What else can I do to help prevent falls? Wear shoes that: Do not have high heels. Have rubber bottoms. Are comfortable and fit you well. Are closed at the toe. Do not wear sandals. If you use a stepladder: Make sure that it is fully opened. Do not climb a closed stepladder. Make sure that both sides of the stepladder are locked into place. Ask someone to hold it for you, if possible. Clearly mark and make sure that you can see: Any grab bars or handrails. First and last steps. Where the edge of each step is. Use tools that help you move around (mobility aids) if they are  needed. These include: Canes. Walkers. Scooters. Crutches. Turn on the lights when you go into a dark area. Replace any light bulbs as soon as they burn out. Set up your furniture so you have a clear path. Avoid moving your furniture around. If any of your floors are uneven, fix them. If there are any pets around you, be aware of where they are. Review your medicines with your doctor. Some medicines can make you feel dizzy. This can increase your chance of falling. Ask your doctor what other things that you can do to help prevent falls. This information is not intended to replace advice given to you by your health care provider. Make sure you discuss any questions you have with your health care provider. Document Released: 05/16/2009 Document Revised: 12/26/2015 Document Reviewed: 08/24/2014 Elsevier Interactive Patient Education  2017 [employer].

## 2021-07-21 ENCOUNTER — Ambulatory Visit: Payer: Medicare Other

## 2021-07-22 ENCOUNTER — Other Ambulatory Visit: Payer: Self-pay

## 2021-07-22 ENCOUNTER — Ambulatory Visit (HOSPITAL_COMMUNITY)
Admission: RE | Admit: 2021-07-22 | Discharge: 2021-07-22 | Disposition: A | Discharge status: Home / Self Care | Payer: HMO | Source: Ambulatory Visit | Attending: Internal Medicine | Admitting: Internal Medicine

## 2021-07-22 DIAGNOSIS — I509 Heart failure, unspecified: Secondary | ICD-10-CM | POA: Insufficient documentation

## 2021-07-22 LAB — BASIC METABOLIC PANEL
Anion gap: 7 (ref 5–15)
BUN: 11 mg/dL (ref 8–23)
CO2: 24 mmol/L (ref 22–32)
Calcium: 9.1 mg/dL (ref 8.9–10.3)
Chloride: 104 mmol/L (ref 98–111)
Creatinine, Ser: 0.9 mg/dL (ref 0.61–1.24)
GFR, Estimated: >60 mL/min (ref >60)
Glucose, Bld: 195 mg/dL — ABNORMAL HIGH (ref 70–99)
Potassium: 4.3 mmol/L (ref 3.5–5.1)
Sodium: 135 mmol/L (ref 135–145)

## 2021-08-01 NOTE — Addendum Note (Signed)
Encounter addended by: Micki Riley, RN on: 08/01/2021 2:29 PM  Actions taken: Imaging Exam ended

## 2021-08-05 ENCOUNTER — Telehealth (INDEPENDENT_AMBULATORY_CARE_PROVIDER_SITE_OTHER): Payer: HMO | Admitting: Family Medicine

## 2021-08-05 ENCOUNTER — Encounter: Payer: Self-pay | Admitting: Family Medicine

## 2021-08-05 VITALS — BP 136/92 | HR 51 | Temp 98.8°F

## 2021-08-05 DIAGNOSIS — I10 Essential (primary) hypertension: Secondary | ICD-10-CM | POA: Diagnosis not present

## 2021-08-05 DIAGNOSIS — E118 Type 2 diabetes mellitus with unspecified complications: Secondary | ICD-10-CM | POA: Diagnosis not present

## 2021-08-05 DIAGNOSIS — E782 Mixed hyperlipidemia: Secondary | ICD-10-CM

## 2021-08-05 DIAGNOSIS — J069 Acute upper respiratory infection, unspecified: Secondary | ICD-10-CM

## 2021-08-05 MED ORDER — EMPAGLIFLOZIN 10 MG PO TABS: 10.0000 mg | ORAL_TABLET | Freq: Every day | ORAL | 3 refills | Status: DC

## 2021-08-05 NOTE — Progress Notes (Signed)
Virtual Visit via Video   I connected with patient on 08/05/21 at  9:30 AM EST by a video enabled telemedicine application and verified that I am speaking with the correct person using two identifiers.  Location patient: Home Location provider: [facility], Office Persons participating in the virtual visit: Patient, Provider, [place] C)  I discussed the limitations of evaluation and management by telemedicine and the availability of in person appointments. The patient expressed understanding and agreed to proceed.  Subjective:   HPI:   HTN- chronic problem, on Spironolactone 25mg  daily, Entresto 97/103mg  BID, Metoprolol XL 100mg  BID, Lasix 20mg  every other day, and Amlodipine 2.5mg  daily.  Recent BMP WNL.  No CP, SOB, HAs, visual changes, edema.  Hyperlipidemia- chronic problem, on Crestor 40mg  daily.  Due for lipid panel.  No abd pain, N/V.  DM- chronic problem.  Currently on Metformin 500mg  daily.  A1C last month was 7.7.  On ARB for renal protection.  UTD on foot exam, eye exam.  No numbness/tingling of feet.  Pt was prescribed Jardiance in the past and was not able to afford.  URI- pt reports sxs started ~5 days ago.  + nasal congestion, dry hacking cough, hoarseness.    ROS:   See pertinent positives and negatives per HPI.  Patient Active Problem List   Diagnosis Date Noted   Hyperlipidemia 08/08/2008    Priority: High   Essential hypertension 08/08/2008    Priority: High   Degenerative disc disease, lumbar 04/04/2021   Abdominal bloating 12/14/2019   Controlled diabetes mellitus type 2 with complications ([place]) 16/05/9603   Obesity (BMI 30-39.9) 06/16/2019   Headache syndrome 10/18/2018   Laryngopharyngeal reflux (LPR) 08/10/2016   BPH (benign prostatic hyperplasia) 06/03/2015   H/O Ross procedure 01/31/2015   Thoracic aortic aneurysm 12/11/2013   Ganglion cyst 06/01/2013   Brachial plexus neuropathy 10/07/2011   Cervical disc disease 10/07/2011    General medical examination 03/11/2011   Carpopedal spasm 02/25/2011   Arthritis of hand 02/25/2011   CARPAL TUNNEL SYNDROME, BILATERAL 06/10/2009   AORTIC VALVE REPLACEMENT, HX OF 06/10/2009   ESOPHAGEAL STRICTURE 03/29/2003   GERD 03/29/2003   HIATAL HERNIA 10/21/2000    Social History   Tobacco Use   Smoking status: Never   Smokeless tobacco: Never  Substance Use Topics   Alcohol use: No    Current Outpatient Medications:    amLODipine (NORVASC) 2.5 MG tablet, Take 1 tablet (2.5 mg total) by mouth daily., Disp: 30 tablet, Rfl: 11   apixaban (ELIQUIS) 5 MG TABS tablet, Take 1 tablet (5 mg total) by mouth 2 (two) times daily., Disp: 60 tablet, Rfl: 11   cetirizine (ZYRTEC) 10 MG tablet, TAKE 1 TABLET(10 MG) BY MOUTH DAILY, Disp: 90 tablet, Rfl: 0   finasteride (PROSCAR) 5 MG tablet, TAKE 1 TABLET(5 MG) BY MOUTH DAILY, Disp: 90 tablet, Rfl: 1   fluticasone (FLONASE) 50 MCG/ACT nasal spray, Place 2 sprays into both nostrils as needed for allergies or rhinitis., Disp: , Rfl:    furosemide (LASIX) 20 MG tablet, Take 1 tablet (20 mg total) by mouth every other day., Disp: 15 tablet, Rfl: 11   metFORMIN (GLUCOPHAGE) 500 MG tablet, TAKE 1 TABLET(500 MG) BY MOUTH DAILY, Disp: 90 tablet, Rfl: 0   metoprolol succinate (TOPROL-XL) 100 MG 24 hr tablet, Take 1 tablet (100 mg total) by mouth 2 (two) times daily., Disp: 180 tablet, Rfl: 3   pantoprazole (PROTONIX) 40 MG tablet, Take 1 tablet (40 mg total) by mouth  daily., Disp: 90 tablet, Rfl: 1   rosuvastatin (CRESTOR) 40 MG tablet, Take 1 tablet (40 mg total) by mouth daily., Disp: 90 tablet, Rfl: 3   sacubitril-valsartan (ENTRESTO) 97-103 MG, Take 1 tablet by mouth 2 (two) times daily., Disp: 180 tablet, Rfl: 3   spironolactone (ALDACTONE) 25 MG tablet, Take 1 tablet (25 mg total) by mouth daily., Disp: 30 tablet, Rfl: 6  Allergies  Allergen Reactions   Penicillins Hives and Swelling   Quinidine Other (See Comments)    Increase heart beat  per patient   Quinolones     Unknown reaction     Objective:   There were no vitals taken for this visit. AAOx3, NAD NCAT, EOMI No obvious CN deficits Coloring WNL Pt is able to speak clearly, coherently without shortness of breath or increased work of breathing.  Thought process is linear.  Mood is appropriate.   Assessment and Plan:   HTN- chronic problem.  Mildly elevated BP today but he is also taking cough and cold medication.  Just saw Cardiology on 12/9 and BP was 130/82.  No med changes at this time but will continue to follow.  Hyperlipidemia- chronic problem.  Tolerating Crestor w/o difficulty.  Due for lipid panel but rather than having him come in just for this, will have him do it at upcoming cardiology appt  DM- chronic problem.  A1C was month was 7.7  UTD on foot exam, eye exam.  Will ask Dr Aundra Dubin about starting Jardiance for both diabetes and cardiac reasons.  Viral URI- pt is already improving.  No need for abx.  Reviewed supportive care.   Annye Asa, MD 08/05/2021

## 2021-08-05 NOTE — Addendum Note (Signed)
Addended by: [name] on: 08/05/2021 12:37 PM   Modules accepted: Orders

## 2021-08-06 NOTE — Telephone Encounter (Signed)
Is jardiance in place of a medication or in addition to what he is already taking

## 2021-08-15 ENCOUNTER — Other Ambulatory Visit (HOSPITAL_COMMUNITY): Payer: Self-pay

## 2021-08-15 ENCOUNTER — Telehealth (HOSPITAL_COMMUNITY): Payer: Self-pay | Admitting: Pharmacist

## 2021-08-15 MED ORDER — SACUBITRIL-VALSARTAN 97-103 MG PO TABS: 1.0000 | ORAL_TABLET | Freq: Two times a day (BID) | ORAL | 3 refills | Status: DC

## 2021-08-15 NOTE — Telephone Encounter (Signed)
Advanced Heart Failure Patient Advocate Encounter   Patient was approved to receive Entresto from [employer].   Patient ID: [id number] Effective dates: 08/03/21 through 08/02/22  Audry Riles, PharmD, BCPS, BCCP, CPP Heart Failure Clinic Pharmacist [phone]

## 2021-08-22 ENCOUNTER — Encounter: Payer: Self-pay | Admitting: Family Medicine

## 2021-08-22 ENCOUNTER — Other Ambulatory Visit: Payer: Self-pay

## 2021-08-22 DIAGNOSIS — G540 Brachial plexus disorders: Secondary | ICD-10-CM

## 2021-08-22 MED ORDER — FINASTERIDE 5 MG PO TABS: ORAL_TABLET | ORAL | 1 refills | Status: DC

## 2021-08-25 ENCOUNTER — Other Ambulatory Visit: Payer: Self-pay | Admitting: Family Medicine

## 2021-09-14 ENCOUNTER — Other Ambulatory Visit: Payer: Self-pay | Admitting: Family Medicine

## 2021-09-14 ENCOUNTER — Other Ambulatory Visit (HOSPITAL_COMMUNITY): Payer: Self-pay | Admitting: Cardiology

## 2021-09-15 ENCOUNTER — Encounter: Payer: Self-pay | Admitting: Family Medicine

## 2021-09-30 DIAGNOSIS — L578 Other skin changes due to chronic exposure to nonionizing radiation: Secondary | ICD-10-CM | POA: Diagnosis not present

## 2021-09-30 DIAGNOSIS — Z86018 Personal history of other benign neoplasm: Secondary | ICD-10-CM | POA: Diagnosis not present

## 2021-09-30 DIAGNOSIS — L821 Other seborrheic keratosis: Secondary | ICD-10-CM | POA: Diagnosis not present

## 2021-09-30 DIAGNOSIS — D2271 Melanocytic nevi of right lower limb, including hip: Secondary | ICD-10-CM | POA: Diagnosis not present

## 2021-09-30 DIAGNOSIS — Z23 Encounter for immunization: Secondary | ICD-10-CM | POA: Diagnosis not present

## 2021-09-30 DIAGNOSIS — D225 Melanocytic nevi of trunk: Secondary | ICD-10-CM | POA: Diagnosis not present

## 2021-09-30 DIAGNOSIS — L814 Other melanin hyperpigmentation: Secondary | ICD-10-CM | POA: Diagnosis not present

## 2021-10-01 ENCOUNTER — Other Ambulatory Visit: Payer: Self-pay | Admitting: Family Medicine

## 2021-10-01 DIAGNOSIS — K219 Gastro-esophageal reflux disease without esophagitis: Secondary | ICD-10-CM

## 2021-10-09 ENCOUNTER — Other Ambulatory Visit: Payer: Self-pay

## 2021-10-09 ENCOUNTER — Ambulatory Visit (HOSPITAL_COMMUNITY)
Admission: RE | Admit: 2021-10-09 | Discharge: 2021-10-09 | Disposition: A | Discharge status: Home / Self Care | Payer: HMO | Source: Ambulatory Visit | Attending: Cardiology | Admitting: Cardiology

## 2021-10-09 ENCOUNTER — Encounter (HOSPITAL_COMMUNITY): Payer: Self-pay | Admitting: Cardiology

## 2021-10-09 VITALS — BP 100/60 | HR 62 | Wt 231.6 lb

## 2021-10-09 DIAGNOSIS — Z79899 Other long term (current) drug therapy: Secondary | ICD-10-CM | POA: Insufficient documentation

## 2021-10-09 DIAGNOSIS — M62512 Muscle wasting and atrophy, not elsewhere classified, left shoulder: Secondary | ICD-10-CM | POA: Insufficient documentation

## 2021-10-09 DIAGNOSIS — I4510 Unspecified right bundle-branch block: Secondary | ICD-10-CM | POA: Diagnosis not present

## 2021-10-09 DIAGNOSIS — R42 Dizziness and giddiness: Secondary | ICD-10-CM | POA: Diagnosis not present

## 2021-10-09 DIAGNOSIS — I440 Atrioventricular block, first degree: Secondary | ICD-10-CM | POA: Insufficient documentation

## 2021-10-09 DIAGNOSIS — G4733 Obstructive sleep apnea (adult) (pediatric): Secondary | ICD-10-CM | POA: Insufficient documentation

## 2021-10-09 DIAGNOSIS — I4892 Unspecified atrial flutter: Secondary | ICD-10-CM | POA: Insufficient documentation

## 2021-10-09 DIAGNOSIS — I110 Hypertensive heart disease with heart failure: Secondary | ICD-10-CM | POA: Insufficient documentation

## 2021-10-09 DIAGNOSIS — Z954 Presence of other heart-valve replacement: Secondary | ICD-10-CM | POA: Diagnosis not present

## 2021-10-09 DIAGNOSIS — I484 Atypical atrial flutter: Secondary | ICD-10-CM

## 2021-10-09 DIAGNOSIS — Q2543 Congenital aneurysm of aorta: Secondary | ICD-10-CM | POA: Insufficient documentation

## 2021-10-09 DIAGNOSIS — M4802 Spinal stenosis, cervical region: Secondary | ICD-10-CM | POA: Insufficient documentation

## 2021-10-09 DIAGNOSIS — Z7901 Long term (current) use of anticoagulants: Secondary | ICD-10-CM | POA: Diagnosis not present

## 2021-10-09 DIAGNOSIS — M62522 Muscle wasting and atrophy, not elsewhere classified, left upper arm: Secondary | ICD-10-CM | POA: Insufficient documentation

## 2021-10-09 DIAGNOSIS — I491 Atrial premature depolarization: Secondary | ICD-10-CM | POA: Insufficient documentation

## 2021-10-09 DIAGNOSIS — E782 Mixed hyperlipidemia: Secondary | ICD-10-CM | POA: Diagnosis not present

## 2021-10-09 DIAGNOSIS — E119 Type 2 diabetes mellitus without complications: Secondary | ICD-10-CM | POA: Insufficient documentation

## 2021-10-09 DIAGNOSIS — M5412 Radiculopathy, cervical region: Secondary | ICD-10-CM | POA: Diagnosis not present

## 2021-10-09 DIAGNOSIS — I10 Essential (primary) hypertension: Secondary | ICD-10-CM

## 2021-10-09 DIAGNOSIS — R9431 Abnormal electrocardiogram [ECG] [EKG]: Secondary | ICD-10-CM | POA: Insufficient documentation

## 2021-10-09 DIAGNOSIS — I97190 Other postprocedural cardiac functional disturbances following cardiac surgery: Secondary | ICD-10-CM | POA: Diagnosis not present

## 2021-10-09 DIAGNOSIS — I5022 Chronic systolic (congestive) heart failure: Secondary | ICD-10-CM | POA: Diagnosis not present

## 2021-10-09 DIAGNOSIS — R531 Weakness: Secondary | ICD-10-CM | POA: Insufficient documentation

## 2021-10-09 DIAGNOSIS — Z7984 Long term (current) use of oral hypoglycemic drugs: Secondary | ICD-10-CM | POA: Diagnosis not present

## 2021-10-09 LAB — BASIC METABOLIC PANEL
Anion gap: 11 (ref 5–15)
BUN: 15 mg/dL (ref 8–23)
CO2: 19 mmol/L — ABNORMAL LOW (ref 22–32)
Calcium: 9.2 mg/dL (ref 8.9–10.3)
Chloride: 107 mmol/L (ref 98–111)
Creatinine, Ser: 0.78 mg/dL (ref 0.61–1.24)
GFR, Estimated: >60 mL/min (ref >60)
Glucose, Bld: 137 mg/dL — ABNORMAL HIGH (ref 70–99)
Potassium: 4.5 mmol/L (ref 3.5–5.1)
Sodium: 137 mmol/L (ref 135–145)

## 2021-10-09 LAB — LIPID PANEL
Cholesterol: 134 mg/dL (ref 0–200)
HDL: 43 mg/dL (ref >40)
LDL Cholesterol: 72 mg/dL (ref 0–99)
Total CHOL/HDL Ratio: 3.1 RATIO
Triglycerides: 97 mg/dL (ref <150)
VLDL: 19 mg/dL (ref 0–40)

## 2021-10-09 NOTE — Progress Notes (Signed)
Patient ID: [name] [name], male   DOB: August 12, 1950, 71 y.o.   MRN: [id number] PCP: Dr. Birdie Riddle Cardiology: Dr. Aundra Dubin  71 y.o. with history of aortic insufficiency s/p Ross procedure and aortic root aneurysm presents for followup of Ross procedure.  He has a left-sided brachial plexopathy of uncertain etiology and has atrophy of his left shoulder and upper arm.  He developed weakness in his right hand as well as occasional vertigo-type spells.  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    Echo in 8/18 showed no regurgitation or stenosis of autograft in aortic position, bioprosthetic pulmonic valve with mild pulmonic stenosis, mean gradient 10 mmHg.  EF was read as 40-45%, which is lower than in the past.  MRA chest 3/19 showed 5.7 cm dilation of pulmonary autograft at level of sinuses of Valsalva, MRA chest in 6/20 showed 6 cm dilation of the pulmonary autograft at the level of the sinuses of valsalva.  He is being followed for aortic root aneurysm by Dr. Servando Snare.   MRA chest in 6/21 showed aortic root 5.4 cm, no change from prior. Echo in 8/21 showed EF 50%, RV mildly enlarged with mildly decreased systolic function, pulmonary valve autograft in aortic position with mild regurgitation but no stenosis, bioprosthetic pulmonary valve with no significant stenosis or regurgitation, ascending aorta 5.3 cm.   Patient was noted to be in atrial flutter in 8/21, rate 110s.  Looking back, his HR was elevated in the 110s at physician appointments back to 5/21 (no ECGs).  I took him for TEE-guided DCCV in 8/21.  TEE showed EF 40%, mildly decreased RV function, aortic valve replaced by pulmonary autograft with mild AI and no AS, bioprosthetic PV looked  stable.  No LA appendage thrombus, he underwent DCCV.  After DCCV, he developed exertional dyspnea and was started on Lasix.   Echo in 10/21 showed EF improved to 55-60%, mild RV dilation with mildly decreased systolic function, bioprosthetic PV with peak gradient 31 mmHg (mild PS), pulmonary autograft in aortic position with mild regurgitation and no stenosis.  Sleep study showed severe OSA but he does not want to use CPAP.   MRA chest in 6/22 showed stable 5.4 cm aortic root.  Echo was done today and reviewed, EF 50%, mild LV dilation, mildly decreased RV systolic function, pulmonary autograft in aortic position with mild regurgitation and no stenosis, bioprosthetic pulmonary valve with no significant stenosis or regurgitation, PASP 28 mmHg.   Zio monitor (12/22) showed short SVT and NSVT, no significant arrhythmias.    He remains in NSR today.  Weight is down 11 lbs. No significant exertional dyspnea.  No chest pain.  Riding bike for exercise. No problems with stairs. Rare lightheadedness if he stands up too quick, no further presyncope episodes.      ECG (personally reviewed): NSR, 1st degree AVB 304 msec, RBBB.   Labs (2/14): K 3.7, creatinine 0.9, LDL 63, HDL 29 Labs (3/14): BNP 36 Labs (2/15): K 3.7, creatinine 0.8, LDL 61, HDL 30 Labs (4/16): K 3.7, creatinine 0.8, LDL 51, HDL 34 Labs (5/16): HCT 44.9 Labs (11/17): LDL 74, HDL 40, K 4.1, creatinine 0.77 Labs (11/18): LDL 75, HDL 35 Labs (2/19): K 4.2, creatinine 0.88 Labs (5/20):  LDL 60, K 4.7, creatinine 0.81 Labs (5/21): TSH normal, LDL 71, K 4.7, creatinine 0.78 Labs (8/21): K 4.3, creatinine 0.75 => 1.02 Labs (5/21): LDL 70 Labs (9/21): K 4, creatinine 0.88 Labs (12/21): LDL 110, K 4.1, creatinine 0.84 Labs (2/22): K 4.8, creatinine 0.9 Labs (4/22): LDL 70, HDL 41 Labs (6/22): LDL 64, K 4.4, creatinine 0.76, hgb 14.6 Labs (12/22): K 4.3, creatinine 0.9  PMH: 1. Aortic insufficiency: s/p Ross procedure in 1994 in [place].    - Echo (4/11) with hypokinetic basal septum (likely post-surgical), EF 50%, mild LVH, s/p Ross procedure with native pulmonic valve in aortic position with mild aortic insufficiency and bioprosthetic pulmonic valve with no pulmonic insufficiency, mild MR, aortic upper normal in size.  - Echo (4/13) with EF 55%, mild LVH, mild AI, mild MR, mild RV dilation, bioprosthetic pulmonic valve with peak pressure 25 mmHg.   - Echo (3/14): Mild LV dilation, mild LVH, EF 60%, pulmonary valve in aortic position (s/p Ross) with mild AI and mildly dilated ascending aorta to 4.3 cm, mildly dilated RV with normal systolic function, bioprosthetic PVR with mean gradient 23 mmHg.  Echo (3/15) with EF 55-60%, mild LVH, mild AI with no AS, bioprosthetic pulmonic valve with peak gradient 31 mmHg, ascending aorta 4.4 cm.   - Cardiac MRI/MRA chest (5/15) with mild regurgitation of autograft aortic valve and mild regurgitation of homograft pulmonic valve, dilation of aortic root at sinuses of valsalva (4.8 cm), EF 60%, no LGE, mild RV dilation with normal RV systolic function.   - Echo (5/16) with EF 55-60%, mild autograft AI, mild MR, mildly decreased RV systolic function, biatrial enlargement, RVSP 35 mmHg, trivially increased gradient across bioprosthetic pulmonic valve (no progression).  - Echo (8/17): EF 55-60%, pulmonary autograft in aortic valve position, mild AI, bioprosthetic pulmonary valve with mild PS (peak 22 mmHg), moderate RV dilation with normal systolic function.  - Echo (8/18): EF 40-45%, mildly increased gradient across bioprosthetic pulmonary valve (mean 10 mmHg), s/p Ross procedure with no stenosis or significant regurgitation of pulmonary autograft in aortic position.  - Echo (8/21): EF 50%, RV mildly enlarged with mildly decreased systolic function, pulmonary valve autograft in aortic position with mild regurgitation but no stenosis, bioprosthetic pulmonary valve with no significant stenosis or regurgitation,  ascending aorta 5.3 cm.  - TEE (8/21): EF 40%, mildly decreased RV function, aortic valve replaced by pulmonary autograft with mild AI and no AS, bioprosthetic PV looked stable. - Echo (10/21): EF improved to 55-60%, mild RV dilation with mildly decreased systolic function, bioprosthetic PV with peak gradient 31 mmHg (mild PS), pulmonary autograft in aortic position with mild regurgitation and not stenosis.  - Echo (12/22): EF 50%, mild LV dilation, mildly decreased RV systolic function, pulmonary autograft in aortic position with mild regurgitation and no stenosis, bioprosthetic pulmonary valve with no significant stenosis or regurgitation, PASP 28 mmHg. 2. Post-operative atrial fibrillation after heart surgery.  3. Normal left heart cath prior to 1994 heart surgery.  4. Left brachial plexopathy with left shoulder muscle atrophy.  Uncertain etiology.  5. BPH 6. Hyperlipidemia.  7. Cervical spinal stenosis with radiculopathy.   8. 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  9. ETT-Sestamibi (3/14): No ischemia or infarction.  10. Carotid dopplers (6/14): mild disease only.  11. Sinus of valsalva aneurysm: MRA chest 2/18 with dilation of pulmonary autograft at level of sinuses of Valsalva to 5.4 cm when  measured into left cusp.  - MRA chest (3/19): Saccular dilatation of the left and right sinuses of valsalva, 5.7 cm aortic root diameter.   - MRA chest (6/20): 6 cm dilated sinuses of valsalva, 4.6 cm ascending aorta.  - MRA chest (6/21): aortic root 5.4 cm, no change from prior - MRA chest (6/22): aortic root 5.4 cm.  12. Elevated left hemidiaphragm.  13. Atrial flutter: Atypical.  Noted in 8/21 => underwent DCCV to NSR.  14. Presyncope: Zio monitor 12/22 with short SVT and NSVT runs, no significant arrhythmias.  FH: Prostate CA, HTN  SH: Lives in [place], owns a window washing business, married, nonsmoker.   ROS: All  systems reviewed and negative except as per HPI.    Current Outpatient Medications  Medication Sig Dispense Refill   amLODipine (NORVASC) 2.5 MG tablet Take 1 tablet (2.5 mg total) by mouth daily. 30 tablet 11   apixaban (ELIQUIS) 5 MG TABS tablet Take 1 tablet (5 mg total) by mouth 2 (two) times daily. 60 tablet 11   cetirizine (ZYRTEC) 10 MG tablet TAKE 1 TABLET(10 MG) BY MOUTH DAILY 90 tablet 0   empagliflozin (JARDIANCE) 10 MG TABS tablet Take 1 tablet (10 mg total) by mouth daily before breakfast. 30 tablet 3   finasteride (PROSCAR) 5 MG tablet TAKE 1 TABLET(5 MG) BY MOUTH DAILY 90 tablet 1   fluticasone (FLONASE) 50 MCG/ACT nasal spray SHAKE LIQUID AND USE 2 SPRAYS IN EACH NOSTRIL DAILY 16 g 0   furosemide (LASIX) 20 MG tablet Take 1 tablet (20 mg total) by mouth every other day. 15 tablet 11   metoprolol succinate (TOPROL-XL) 100 MG 24 hr tablet Take 1 tablet (100 mg total) by mouth 2 (two) times daily. 180 tablet 3   pantoprazole (PROTONIX) 40 MG tablet TAKE 1 TABLET(40 MG) BY MOUTH DAILY 90 tablet 1   rosuvastatin (CRESTOR) 40 MG tablet TAKE 1 TABLET(40 MG) BY MOUTH DAILY 90 tablet 3   sacubitril-valsartan (ENTRESTO) 97-103 MG Take 1 tablet by mouth 2 (two) times daily. 180 tablet 3   spironolactone (ALDACTONE) 25 MG tablet Take 1 tablet (25 mg total) by mouth daily. 30 tablet 6   No current facility-administered medications for this encounter.    BP 100/60    Pulse 62    Wt 105.1 kg (231 lb 9.6 oz)    SpO2 97%    BMI 28.95 kg/m  General: NAD Neck: No JVD, no thyromegaly or thyroid nodule.  Lungs: Clear to auscultation bilaterally with normal respiratory effort. CV: Nondisplaced PMI.  Heart regular S1/S2 with widely split S2, no S3/S4, 1/6 SEM RUSB.  No peripheral edema.  No carotid bruit.  Normal pedal pulses.  Abdomen: Soft, nontender, no hepatosplenomegaly, no distention.  Skin: Intact without lesions or rashes.  Neurologic: Alert and oriented x 3.  Psych: Normal  affect. Extremities: No clubbing or cyanosis.  HEENT: Normal.   Assessment/Plan: 1. Atrial flutter:  Likely related to prior cardiac surgery.  He had peri-operative AF at time of heart surgery but had not been noted to have recurrent arrhythmias until 8/21.  In 8/21, he was noted to be in atrial flutter with RVR, based on review of HR over the past few months, he had probably been in atrial flutter since at least 5/21. He had DCCV in 8/21 and is in NSR today. He saw Dr. Rayann Heman, ablation offered if flutter recurs.  - Continue Eliquis 5 mg bid.  - Continue Toprol XL 100 mg bid.  2.  History of Ross Procedure: Echo in 12/22 (today) showed pulmonary autograft in aortic position with mild AI, no AS.  The bioprosthetic pulmonary valve appeared normal.   - Needs endocarditis prophylaxis with dental work.   3.  Aortic root aneurym: This not uncommonly accompanies Ross procedure.  The pulmonary autograft at sinuses of valsalva measured 5.4 cm by MRA chest in 6/22.  Followed by [facility]. - Per Dr. Roxan Hockey, would hold off on elective repair unless there is significant AI.   - Continue blood pressure control, goal SBP 120s or lower.  4. HTN: Given aneurysm, need to make sure that BP is well-controlled.  - Continue amlodipine, Entresto, spironolactone, Toprol XL.   5. Hyperlipidemia: Continue current Crestor, good lipids in 4/22.  6. Chronic systolic CHF: TEE in 5/03 in setting of long-standing atrial flutter with RVR showed EF down to 40%. 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  He was cardioverted in 8/21 but developed exertional dyspnea with volume overload afterwards.  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  Echo in 12/22 showed EF 50% with mild RV dysfunction. He is not volume overloaded on exam, NYHA class I-II symptoms now.     - Continue Lasix 20 mg qod. BMET today.  - Continue Toprol XL.  - Continue Entresto 97/103 bid.  - Continue spironolactone 25 mg daily. 7.  OSA: Severe by sleep study.  Refuses CPAP.  I suspect this is a major contributor to his daytime sleepiness and fatigue.  8. Diabetes: on metformin.  9. Lightheaded "spells": Patient has baseline 1st degree AVB with RBBB, ?transient higher grade heart block. Zio monitor in 12/22 showed no significant arrhythmias.  He has not had any presyncope recently.   Followup in 6 months  Loralie Champagne 10/09/2021

## 2021-10-09 NOTE — Patient Instructions (Signed)
Thank you for visit today. ? ?Labs done today, your results will be available in MyChart, we will contact you for abnormal readings. ? ?Your physician recommends that you schedule a follow-up appointment in: 6 months (September 2023) ** please call the office in July to arrange your follow up ** ? ? ?If you have any questions or concerns before your next appointment please send [place] a message through [place] or call our office at [phone].   ? ?TO LEAVE A MESSAGE FOR THE NURSE SELECT OPTION 2, PLEASE LEAVE A MESSAGE INCLUDING: ?YOUR NAME ?DATE OF BIRTH ?CALL BACK NUMBER ?REASON FOR CALL**this is important as we prioritize the call backs ? ?YOU WILL RECEIVE A CALL BACK THE SAME DAY AS LONG AS YOU CALL BEFORE 4:00 PM ? ?At the [facility], you and your health needs are our priority. As part of our continuing mission to provide you with exceptional heart care, we have created designated Provider Care Teams. These Care Teams include your primary Cardiologist (physician) and Advanced Practice Providers (APPs- Physician Assistants and Nurse Practitioners) who all work together to provide you with the care you need, when you need it.  ? ?You may see any of the following providers on your designated Care Team at your next follow up: ?Dr Glori Bickers ?Dr Loralie Champagne ?Darrick Grinder, NP ?Lyda Jester, PA ?Jessica Milford,NP ?Marlyce Huge, PA ?Audry Riles, PharmD ? ? ?Please be sure to bring in all your medications bottles to every appointment.  ? ? ?

## 2021-10-13 ENCOUNTER — Other Ambulatory Visit (HOSPITAL_COMMUNITY): Payer: Self-pay

## 2021-10-13 ENCOUNTER — Other Ambulatory Visit (HOSPITAL_COMMUNITY): Payer: Self-pay | Admitting: *Deleted

## 2021-10-13 MED ORDER — APIXABAN 5 MG PO TABS: 5.0000 mg | ORAL_TABLET | Freq: Two times a day (BID) | ORAL | 11 refills | Status: DC

## 2021-10-14 ENCOUNTER — Other Ambulatory Visit (HOSPITAL_COMMUNITY): Payer: Self-pay

## 2021-10-14 ENCOUNTER — Telehealth (HOSPITAL_COMMUNITY): Payer: Self-pay | Admitting: Pharmacy Technician

## 2021-10-14 NOTE — Telephone Encounter (Signed)
This has been handled Dr. Aundra Dubin. No changes need to be made at this time. Patient will continue Eliquis

## 2021-10-14 NOTE — Telephone Encounter (Signed)
He would have to spend 3% OOP on medications in the year 2023 in order to get approval from [employer]. The patient and I emailed back and forth about this yesterday, so he is aware.

## 2021-10-14 NOTE — Telephone Encounter (Signed)
Advanced Heart Failure Patient Advocate Encounter ? ?The patient was approved for a [employer] grant that will help cover the cost of Jardiance. Total amount awarded, $10,000. Eligibility dates, 09/13/21-09/12/22. ? ?ID [id number] ?  ?BIN [id number] ?  ?PCN [id number] ?  ?GROUP [id number] ? ?Called and spoke with the patient. 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 I advised him that we can intermittently provide samples if needed while he tried to meet the [facility] requirement. Emailed copy of grant information.  ? ?Charlann Boxer, CPhT ? ?

## 2021-10-16 ENCOUNTER — Other Ambulatory Visit (HOSPITAL_COMMUNITY): Payer: Self-pay | Admitting: *Deleted

## 2021-10-16 MED ORDER — APIXABAN 5 MG PO TABS: 5.0000 mg | ORAL_TABLET | Freq: Two times a day (BID) | ORAL | 3 refills | Status: DC

## 2021-10-21 ENCOUNTER — Telehealth (HOSPITAL_COMMUNITY): Payer: Self-pay | Admitting: Pharmacy Technician

## 2021-10-21 NOTE — Telephone Encounter (Signed)
Advanced Heart Failure Patient Advocate Encounter ? ?Sent in [place] application via fax. Of note, patient included 2022 OOP for he and his wife. Not sure if the patient will meet the 3% OOP for 2023 thus far this year. ? ?Charlann Boxer, CPhT ? ?

## 2021-11-13 ENCOUNTER — Other Ambulatory Visit (HOSPITAL_COMMUNITY): Payer: Self-pay | Admitting: Cardiology

## 2021-11-26 ENCOUNTER — Telehealth: Payer: Self-pay

## 2021-11-26 MED ORDER — EMPAGLIFLOZIN 10 MG PO TABS: 10.0000 mg | ORAL_TABLET | Freq: Every day | ORAL | 3 refills | Status: DC

## 2021-11-26 NOTE — Telephone Encounter (Signed)
MEDICATION:empagliflozin (JARDIANCE) 10 MG TABS tablet ? ?PHARMACY:[id number] - [place], [address] AT [address] ? ?Comments:  ? ?**Let patient know to contact pharmacy at the end of the day to make sure medication is ready. ** ? ?** Please notify patient to allow 48-72 hours to process** ? ?**Encourage patient to contact the pharmacy for refills or they can request refills through [facility]** ?  ?

## 2021-11-27 ENCOUNTER — Encounter: Payer: Self-pay | Admitting: Family Medicine

## 2021-11-28 NOTE — Telephone Encounter (Signed)
Pt reports medication is too costly for him to afford with his fixed income.  ?Is there something else we can do that would be lower cost? Pt has asked about returning to just metformin ? ?

## 2021-12-09 ENCOUNTER — Encounter (HOSPITAL_COMMUNITY): Payer: Self-pay | Admitting: Cardiology

## 2021-12-09 ENCOUNTER — Ambulatory Visit (HOSPITAL_COMMUNITY)
Admission: RE | Admit: 2021-12-09 | Discharge: 2021-12-09 | Disposition: A | Discharge status: Home / Self Care | Payer: HMO | Source: Ambulatory Visit | Attending: Cardiology | Admitting: Cardiology

## 2021-12-09 ENCOUNTER — Other Ambulatory Visit (HOSPITAL_COMMUNITY): Payer: Self-pay

## 2021-12-09 VITALS — BP 90/50 | HR 60 | Wt 232.0 lb

## 2021-12-09 DIAGNOSIS — E785 Hyperlipidemia, unspecified: Secondary | ICD-10-CM | POA: Diagnosis not present

## 2021-12-09 DIAGNOSIS — R0609 Other forms of dyspnea: Secondary | ICD-10-CM | POA: Insufficient documentation

## 2021-12-09 DIAGNOSIS — Z7984 Long term (current) use of oral hypoglycemic drugs: Secondary | ICD-10-CM | POA: Insufficient documentation

## 2021-12-09 DIAGNOSIS — I351 Nonrheumatic aortic (valve) insufficiency: Secondary | ICD-10-CM | POA: Diagnosis not present

## 2021-12-09 DIAGNOSIS — I110 Hypertensive heart disease with heart failure: Secondary | ICD-10-CM | POA: Insufficient documentation

## 2021-12-09 DIAGNOSIS — I959 Hypotension, unspecified: Secondary | ICD-10-CM | POA: Insufficient documentation

## 2021-12-09 DIAGNOSIS — E119 Type 2 diabetes mellitus without complications: Secondary | ICD-10-CM | POA: Insufficient documentation

## 2021-12-09 DIAGNOSIS — G4733 Obstructive sleep apnea (adult) (pediatric): Secondary | ICD-10-CM | POA: Insufficient documentation

## 2021-12-09 DIAGNOSIS — I4892 Unspecified atrial flutter: Secondary | ICD-10-CM | POA: Insufficient documentation

## 2021-12-09 DIAGNOSIS — I5022 Chronic systolic (congestive) heart failure: Secondary | ICD-10-CM

## 2021-12-09 DIAGNOSIS — Z7901 Long term (current) use of anticoagulants: Secondary | ICD-10-CM | POA: Diagnosis not present

## 2021-12-09 DIAGNOSIS — I484 Atypical atrial flutter: Secondary | ICD-10-CM

## 2021-12-09 DIAGNOSIS — I10 Essential (primary) hypertension: Secondary | ICD-10-CM | POA: Diagnosis not present

## 2021-12-09 DIAGNOSIS — Z79899 Other long term (current) drug therapy: Secondary | ICD-10-CM | POA: Insufficient documentation

## 2021-12-09 LAB — CBC
HCT: 43.8 % (ref 39.0–52.0)
Hemoglobin: 15 g/dL (ref 13.0–17.0)
MCH: 31.1 pg (ref 26.0–34.0)
MCHC: 34.2 g/dL (ref 30.0–36.0)
MCV: 90.7 fL (ref 80.0–100.0)
Platelets: 238 10*3/uL (ref 150–400)
RBC: 4.83 MIL/uL (ref 4.22–5.81)
RDW: 13.3 % (ref 11.5–15.5)
WBC: 7.8 10*3/uL (ref 4.0–10.5)
nRBC: 0 % (ref 0.0–0.2)

## 2021-12-09 LAB — BASIC METABOLIC PANEL
Anion gap: 8 (ref 5–15)
BUN: 19 mg/dL (ref 8–23)
CO2: 22 mmol/L (ref 22–32)
Calcium: 9.1 mg/dL (ref 8.9–10.3)
Chloride: 107 mmol/L (ref 98–111)
Creatinine, Ser: 0.84 mg/dL (ref 0.61–1.24)
GFR, Estimated: >60 mL/min (ref >60)
Glucose, Bld: 145 mg/dL — ABNORMAL HIGH (ref 70–99)
Potassium: 4.1 mmol/L (ref 3.5–5.1)
Sodium: 137 mmol/L (ref 135–145)

## 2021-12-09 LAB — BRAIN NATRIURETIC PEPTIDE: B Natriuretic Peptide: 228.6 pg/mL — ABNORMAL HIGH (ref 0.0–100.0)

## 2021-12-09 LAB — TSH: TSH: 2.362 u[IU]/mL (ref 0.350–4.500)

## 2021-12-09 MED ORDER — ENTRESTO 49-51 MG PO TABS: 1.0000 | ORAL_TABLET | Freq: Two times a day (BID) | ORAL | 3 refills | Status: DC

## 2021-12-09 NOTE — Progress Notes (Signed)
error 

## 2021-12-09 NOTE — Patient Instructions (Signed)
Medication Changes: ? ?Decrease Entresto to 49/51 Twice daily ? ? Stop Amlodipine for now  ? ?Lab Work: ? ?Labs done today, your results will be available in MyChart, we will contact you for abnormal readings. ? ? ?Testing/Procedures: ? ?none ? ?Referrals: ? ?none ? ?Special Instructions // Education: ? ?none ? ?Follow-Up in: 3 weeks  ? ?At the [facility], you and your health needs are our priority. We have a designated team specialized in the treatment of Heart Failure. This Care Team includes your primary Heart Failure Specialized Cardiologist (physician), Advanced Practice Providers (APPs- Physician Assistants and Nurse Practitioners), and Pharmacist who all work together to provide you with the care you need, when you need it.  ? ?You may see any of the following providers on your designated Care Team at your next follow up: ? ?Dr Glori Bickers ?Dr Loralie Champagne ?Darrick Grinder, NP ?Lyda Jester, PA ?Jessica Milford,NP ?Marlyce Huge, PA ?Audry Riles, PharmD ? ? ?Please be sure to bring in all your medications bottles to every appointment.  ? ?Need to Contact us: ? ?If you have any questions or concerns before your next appointment please send [place] a message through [place] or call our office at [phone].   ? ?TO LEAVE A MESSAGE FOR THE NURSE SELECT OPTION 2, PLEASE LEAVE A MESSAGE INCLUDING: ?YOUR NAME ?DATE OF BIRTH ?CALL BACK NUMBER ?REASON FOR CALL**this is important as we prioritize the call backs ? ?YOU WILL RECEIVE A CALL BACK THE SAME DAY AS LONG AS YOU CALL BEFORE 4:00 PM ? ? ?

## 2021-12-09 NOTE — Progress Notes (Signed)
Patient ID: [name] [name], male   DOB: 12-08-1950, 71 y.o.   MRN: [id number] ?PCP: Dr. Birdie Riddle ?Cardiology: Dr. Aundra Dubin ? ?71 y.o. with history of aortic insufficiency s/p Ross procedure and aortic root aneurysm presents for followup of Ross procedure.  He has a left-sided brachial plexopathy of uncertain etiology and has atrophy of his left shoulder and upper arm.  He developed weakness in his right hand as well as occasional vertigo-type spells.  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   ? ?Echo in 8/18 showed no regurgitation or stenosis of autograft in aortic position, bioprosthetic pulmonic valve with mild pulmonic stenosis, mean gradient 10 mmHg.  EF was read as 40-45%, which is lower than in the past.  MRA chest 3/19 showed 5.7 cm dilation of pulmonary autograft at level of sinuses of Valsalva, MRA chest in 6/20 showed 6 cm dilation of the pulmonary autograft at the level of the sinuses of valsalva.  He is being followed for aortic root aneurysm by Dr. Servando Snare.  ? ?MRA chest in 6/21 showed aortic root 5.4 cm, no change from prior. Echo in 8/21 showed EF 50%, RV mildly enlarged with mildly decreased systolic function, pulmonary valve autograft in aortic position with mild regurgitation but no stenosis, bioprosthetic pulmonary valve with no significant stenosis or regurgitation, ascending aorta 5.3 cm.  ? ?Patient was noted to be in atrial flutter in 8/21, rate 110s.  Looking back, his HR was elevated in the 110s at physician appointments back to 5/21 (no ECGs).  I took him for TEE-guided DCCV in 8/21.  TEE showed EF 40%, mildly decreased RV function, aortic valve replaced by pulmonary autograft with mild AI and no AS, bioprosthetic PV looked  stable.  No LA appendage thrombus, he underwent DCCV.  After DCCV, he developed exertional dyspnea and was started on Lasix.  ? ?Echo in 10/21 showed EF improved to 55-60%, mild RV dilation with mildly decreased systolic function, bioprosthetic PV with peak gradient 31 mmHg (mild PS), pulmonary autograft in aortic position with mild regurgitation and no stenosis.  Sleep study showed severe OSA but he does not want to use CPAP.  ? ?MRA chest in 6/22 showed stable 5.4 cm aortic root.  Echo was done today and reviewed, EF 50%, mild LV dilation, mildly decreased RV systolic function, pulmonary autograft in aortic position with mild regurgitation and no stenosis, bioprosthetic pulmonary valve with no significant stenosis or regurgitation, PASP 28 mmHg.  ? ?Zio monitor (12/22) showed short SVT and NSVT, no significant arrhythmias.   ? ?Patient comes in today for a work in visit.  He says that his BP has been running low.  SBP as low as 80s, often in 90s.  He feels lightheaded with low BP. He also has been having headaches.  No syncope/presyncope.  No falls.  Poor energy level.  No palpitations, he is in NSR today.  Weight stable.  No diarrhea or other reason for dehydration.  He has been eating normally.  No exertional chest pain though he does get GERD symptoms after meals.  He stopped amlodipine for the last few days and has felt better. BP 90/50 today.     ? ?ECG (personally reviewed): NSR, 1st degree AVB, RBBB.  ? ?  Labs (2/14): K 3.7, creatinine 0.9, LDL 63, HDL 29 ?Labs (3/14): BNP 36 ?Labs (2/15): K 3.7, creatinine 0.8, LDL 61, HDL 30 ?Labs (4/16): K 3.7, creatinine 0.8, LDL 51, HDL 34 ?Labs (5/16): HCT 44.9 ?Labs (11/17): LDL 74, HDL 40, K 4.1, creatinine 0.77 ?Labs (11/18): LDL 75, HDL 35 ?Labs (2/19): K 4.2, creatinine 0.88 ?Labs (5/20): LDL 60, K 4.7, creatinine 0.81 ?Labs (5/21): TSH normal, LDL 71, K 4.7, creatinine 0.78 ?Labs (8/21): K 4.3, creatinine 0.75 => 1.02 ?Labs (5/21): LDL 70 ?Labs (9/21): K 4,  creatinine 0.88 ?Labs (12/21): LDL 110, K 4.1, creatinine 0.84 ?Labs (2/22): K 4.8, creatinine 0.9 ?Labs (4/22): LDL 70, HDL 41 ?Labs (6/22): LDL 64, K 4.4, creatinine 0.76, hgb 14.6 ?Labs (12/22): K 4.3, creatinine 0.9 ?Labs (3/23): K 4.5, creatinine 0.78, LDL 72, TGs 97 ? ?PMH: ?1. Aortic insufficiency: s/p Ross procedure in 1994 in [place].   ?- Echo (4/11) with hypokinetic basal septum (likely post-surgical), EF 50%, mild LVH, s/p Ross procedure with native pulmonic valve in aortic position with mild aortic insufficiency and bioprosthetic pulmonic valve with no pulmonic insufficiency, mild MR, aortic upper normal in size.  ?- Echo (4/13) with EF 55%, mild LVH, mild AI, mild MR, mild RV dilation, bioprosthetic pulmonic valve with peak pressure 25 mmHg.   ?- Echo (3/14): Mild LV dilation, mild LVH, EF 60%, pulmonary valve in aortic position (s/p Ross) with mild AI and mildly dilated ascending aorta to 4.3 cm, mildly dilated RV with normal systolic function, bioprosthetic PVR with mean gradient 23 mmHg.  Echo (3/15) with EF 55-60%, mild LVH, mild AI with no AS, bioprosthetic pulmonic valve with peak gradient 31 mmHg, ascending aorta 4.4 cm.   ?- Cardiac MRI/MRA chest (5/15) with mild regurgitation of autograft aortic valve and mild regurgitation of homograft pulmonic valve, dilation of aortic root at sinuses of valsalva (4.8 cm), EF 60%, no LGE, mild RV dilation with normal RV systolic function.   ?- Echo (5/16) with EF 55-60%, mild autograft AI, mild MR, mildly decreased RV systolic function, biatrial enlargement, RVSP 35 mmHg, trivially increased gradient across bioprosthetic pulmonic valve (no progression).  ?- Echo (8/17): EF 55-60%, pulmonary autograft in aortic valve position, mild AI, bioprosthetic pulmonary valve with mild PS (peak 22 mmHg), moderate RV dilation with normal systolic function.  ?- Echo (8/18): EF 40-45%, mildly increased gradient across bioprosthetic pulmonary valve (mean 10 mmHg), s/p  Ross procedure with no stenosis or significant regurgitation of pulmonary autograft in aortic position.  ?- Echo (8/21): EF 50%, RV mildly enlarged with mildly decreased systolic function, pulmonary valve autograft in aortic position with mild regurgitation but no stenosis, bioprosthetic pulmonary valve with no significant stenosis or regurgitation, ascending aorta 5.3 cm.  ?- TEE (8/21): EF 40%, mildly decreased RV function, aortic valve replaced by pulmonary autograft with mild AI and no AS, bioprosthetic PV looked stable. ?- Echo (10/21): EF improved to 55-60%, mild RV dilation with mildly decreased systolic function, bioprosthetic PV with peak gradient 31 mmHg (mild PS), pulmonary autograft in aortic position with mild regurgitation and not stenosis.  ?- Echo (12/22): EF 50%, mild LV dilation, mildly decreased RV systolic function, pulmonary autograft in aortic position with mild regurgitation and no stenosis, bioprosthetic pulmonary valve with no significant stenosis or regurgitation, PASP 28 mmHg. ?2. Post-operative atrial fibrillation after heart surgery.  ?3. Normal left heart cath prior to 1994 heart surgery.  ?4. Left brachial plexopathy with left shoulder muscle atrophy.  Uncertain etiology.  ?5.  BPH ?6. Hyperlipidemia.  ?7. Cervical spinal stenosis with radiculopathy.   ?8. 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  ?9. ETT-Sestamibi (3/14): No ischemia or infarction.  ?10. Carotid dopplers (6/14): mild disease only.  ?11. Sinus of valsalva aneurysm: MRA chest 2/18 with dilation of pulmonary autograft at level of sinuses of Valsalva to 5.4 cm when measured into left cusp.  ?- MRA chest (3/19): Saccular dilatation of the left and right sinuses of valsalva, 5.7 cm aortic root diameter.   ?- MRA chest (6/20): 6 cm dilated sinuses of valsalva, 4.6 cm ascending aorta.  ?- MRA chest (6/21): aortic root 5.4 cm, no change from prior ?- MRA  chest (6/22): aortic root 5.4 cm.  ?12. Elevated left hemidiaphragm.  ?13. Atrial flutter: Atypical.  Noted in 8/21 => underwent DCCV to NSR.  ?14. Presyncope: Zio monitor 12/22 with short SVT and NSVT runs, no

## 2021-12-09 NOTE — Progress Notes (Signed)
Medication Samples have been provided to the patient. ? ?Drug name: Eliquis       Strength: 5 mg        Qty: 4  LOT: [id number]  Exp.Date: 09/2023 ? ?Dosing instructions: Take 1 tablet Twice daily ? ? ?The patient has been instructed regarding the correct time, dose, and frequency of taking this medication, including desired effects and most common side effects.  ? ?Juanita Laster Kameron Blethen ?4:00 PM ?12/09/2021 ? ?

## 2021-12-18 ENCOUNTER — Telehealth (HOSPITAL_COMMUNITY): Payer: Self-pay | Admitting: Pharmacy Technician

## 2021-12-18 NOTE — Telephone Encounter (Signed)
Advanced Heart Failure Patient Advocate Encounter  Patient sent email requesting help with [employer]. Looks like [id number]'$  [name] RX was sent on 05/09. Patient states that [employer] has not sent the medication and are claiming that the RX was not received. [employer]. Representative was able to confirm receipt of RX. The pharmacy was willing to let me authorize shipment of the refill in this case. The patient should expect to receive the medication Wed 12/24/21 via UPS.  Emailed patient with update.   Charlann Boxer, CPhT

## 2021-12-18 NOTE — Telephone Encounter (Signed)
Advanced Heart Failure Patient Advocate Encounter  Called [employer] to check the status of the patient's application. Representative stated that the patient was denied because he needed to provide a tax return. A 2021 tax return was attached. The representative stated that she would send the application back to management. Most likely, the patient has not met the 3% OOP requirement yet. She could not see the amount and needed that team to enter the information in. Emailed patient with update.   Will follow up.

## 2021-12-22 ENCOUNTER — Other Ambulatory Visit: Payer: Self-pay

## 2021-12-22 ENCOUNTER — Telehealth: Payer: Self-pay | Admitting: Family Medicine

## 2021-12-22 DIAGNOSIS — J302 Other seasonal allergic rhinitis: Secondary | ICD-10-CM

## 2021-12-22 MED ORDER — FLUTICASONE PROPIONATE 50 MCG/ACT NA SUSP: 2.0000 | NASAL | 30 refills | Status: DC | PRN

## 2021-12-22 NOTE — Telephone Encounter (Signed)
Pt called in asking for a refill on the fluticasone to be sent to the [facility] on [address]   Please advise

## 2021-12-26 ENCOUNTER — Encounter: Payer: Self-pay | Admitting: Family Medicine

## 2021-12-30 ENCOUNTER — Encounter (HOSPITAL_COMMUNITY): Payer: Self-pay

## 2021-12-30 ENCOUNTER — Ambulatory Visit (HOSPITAL_COMMUNITY)
Admission: RE | Admit: 2021-12-30 | Discharge: 2021-12-30 | Disposition: A | Discharge status: Home / Self Care | Payer: HMO | Source: Ambulatory Visit | Attending: Family Medicine | Admitting: Family Medicine

## 2021-12-30 VITALS — BP 118/80 | HR 51 | Wt 227.8 lb

## 2021-12-30 DIAGNOSIS — R42 Dizziness and giddiness: Secondary | ICD-10-CM | POA: Diagnosis not present

## 2021-12-30 DIAGNOSIS — I712 Thoracic aortic aneurysm, without rupture, unspecified: Secondary | ICD-10-CM | POA: Diagnosis not present

## 2021-12-30 DIAGNOSIS — I484 Atypical atrial flutter: Secondary | ICD-10-CM

## 2021-12-30 DIAGNOSIS — Z79899 Other long term (current) drug therapy: Secondary | ICD-10-CM | POA: Insufficient documentation

## 2021-12-30 DIAGNOSIS — R519 Headache, unspecified: Secondary | ICD-10-CM | POA: Insufficient documentation

## 2021-12-30 DIAGNOSIS — Z952 Presence of prosthetic heart valve: Secondary | ICD-10-CM | POA: Diagnosis not present

## 2021-12-30 DIAGNOSIS — Z954 Presence of other heart-valve replacement: Secondary | ICD-10-CM

## 2021-12-30 DIAGNOSIS — E782 Mixed hyperlipidemia: Secondary | ICD-10-CM | POA: Diagnosis not present

## 2021-12-30 DIAGNOSIS — E785 Hyperlipidemia, unspecified: Secondary | ICD-10-CM | POA: Diagnosis not present

## 2021-12-30 DIAGNOSIS — R0609 Other forms of dyspnea: Secondary | ICD-10-CM | POA: Diagnosis not present

## 2021-12-30 DIAGNOSIS — I4892 Unspecified atrial flutter: Secondary | ICD-10-CM | POA: Diagnosis not present

## 2021-12-30 DIAGNOSIS — E118 Type 2 diabetes mellitus with unspecified complications: Secondary | ICD-10-CM

## 2021-12-30 DIAGNOSIS — I5022 Chronic systolic (congestive) heart failure: Secondary | ICD-10-CM | POA: Diagnosis not present

## 2021-12-30 DIAGNOSIS — G4733 Obstructive sleep apnea (adult) (pediatric): Secondary | ICD-10-CM | POA: Insufficient documentation

## 2021-12-30 DIAGNOSIS — Z7901 Long term (current) use of anticoagulants: Secondary | ICD-10-CM | POA: Insufficient documentation

## 2021-12-30 DIAGNOSIS — I10 Essential (primary) hypertension: Secondary | ICD-10-CM | POA: Diagnosis not present

## 2021-12-30 DIAGNOSIS — Z7984 Long term (current) use of oral hypoglycemic drugs: Secondary | ICD-10-CM | POA: Diagnosis not present

## 2021-12-30 DIAGNOSIS — E119 Type 2 diabetes mellitus without complications: Secondary | ICD-10-CM | POA: Diagnosis not present

## 2021-12-30 DIAGNOSIS — I110 Hypertensive heart disease with heart failure: Secondary | ICD-10-CM | POA: Diagnosis not present

## 2021-12-30 MED ORDER — ENTRESTO 24-26 MG PO TABS: 1.0000 | ORAL_TABLET | Freq: Two times a day (BID) | ORAL | 3 refills | Status: DC

## 2021-12-30 NOTE — Progress Notes (Signed)
Patient ID: [name] [name], male   DOB: 12-13-1950, 71 y.o.   MRN: [id number] PCP: Dr. Birdie Riddle Cardiology: Dr. Aundra Dubin  70 y.o. with history of aortic insufficiency s/p Ross procedure and aortic root aneurysm presents for followup of Ross procedure.  He has a left-sided brachial plexopathy of uncertain etiology and has atrophy of his left shoulder and upper arm.  He developed weakness in his right hand as well as occasional vertigo-type spells.  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    Echo in 8/18 showed no regurgitation or stenosis of autograft in aortic position, bioprosthetic pulmonic valve with mild pulmonic stenosis, mean gradient 10 mmHg.  EF was read as 40-45%, which is lower than in the past.  MRA chest 3/19 showed 5.7 cm dilation of pulmonary autograft at level of sinuses of Valsalva, MRA chest in 6/20 showed 6 cm dilation of the pulmonary autograft at the level of the sinuses of valsalva.  He is being followed for aortic root aneurysm by Dr. Servando Snare.   MRA chest in 6/21 showed aortic root 5.4 cm, no change from prior. Echo in 8/21 showed EF 50%, RV mildly enlarged with mildly decreased systolic function, pulmonary valve autograft in aortic position with mild regurgitation but no stenosis, bioprosthetic pulmonary valve with no significant stenosis or regurgitation, ascending aorta 5.3 cm.   Patient was noted to be in atrial flutter in 8/21, rate 110s.  Looking back, his HR was elevated in the 110s at physician appointments back to 5/21 (no ECGs).  I took him for TEE-guided DCCV in 8/21.  TEE showed EF 40%, mildly decreased RV function, aortic valve replaced by pulmonary autograft with mild AI and no AS, bioprosthetic PV looked  stable.  No LA appendage thrombus, he underwent DCCV.  After DCCV, he developed exertional dyspnea and was started on Lasix.   Echo in 10/21 showed EF improved to 55-60%, mild RV dilation with mildly decreased systolic function, bioprosthetic PV with peak gradient 31 mmHg (mild PS), pulmonary autograft in aortic position with mild regurgitation and no stenosis.  Sleep study showed severe OSA but he does not want to use CPAP.   MRA chest in 6/22 showed stable 5.4 cm aortic root.  Echo was done today and reviewed, EF 50%, mild LV dilation, mildly decreased RV systolic function, pulmonary autograft in aortic position with mild regurgitation and no stenosis, bioprosthetic pulmonary valve with no significant stenosis or regurgitation, PASP 28 mmHg.   Zio monitor (12/22) showed short SVT and NSVT, no significant arrhythmias.    Follow up 5/23 having low BP at home, lightheaded with poor energy. Advised to remain off amlodipine and decrease Entresto to 49/51.   Today he returns for HF follow up. Main complaint is poor energy and headache. HA relieved with naproxen. Struggling with allergies. BP with modest improvement on home readings, mid 90s-110s, since decreasing Entresto and stopping amlodipine. Continues with lightheadedness, no falls. Occasional dyspnea with increased activity. Denies palpitations, CP, edema, or PND/Orthopnea. Appetite ok. No fever or chills. Weight at home 230 pounds. Taking all medications.   ECG (personally reviewed): none ordered today.  Labs (2/14): K 3.7, creatinine 0.9, LDL 63, HDL 29 Labs (3/14): BNP 36 Labs (2/15): K 3.7, creatinine 0.8,  LDL 61, HDL 30 Labs (4/16): K 3.7, creatinine 0.8, LDL 51, HDL 34 Labs (5/16): HCT 44.9 Labs (11/17): LDL 74, HDL 40, K 4.1, creatinine 0.77 Labs (11/18): LDL 75, HDL 35 Labs (2/19): K 4.2, creatinine 0.88 Labs (5/20): LDL 60, K 4.7, creatinine 0.81 Labs (5/21): TSH normal, LDL 71, K 4.7, creatinine 0.78 Labs (8/21): K 4.3, creatinine  0.75 => 1.02 Labs (5/21): LDL 70 Labs (9/21): K 4, creatinine 0.88 Labs (12/21): LDL 110, K 4.1, creatinine 0.84 Labs (2/22): K 4.8, creatinine 0.9 Labs (4/22): LDL 70, HDL 41 Labs (6/22): LDL 64, K 4.4, creatinine 0.76, hgb 14.6 Labs (12/22): K 4.3, creatinine 0.9 Labs (3/23): K 4.5, creatinine 0.78, LDL 72, TGs 97 Labs (5/23): K 4.1, creatinine 0.84  PMH: 1. Aortic insufficiency: s/p Ross procedure in 1994 in [place].   - Echo (4/11) with hypokinetic basal septum (likely post-surgical), EF 50%, mild LVH, s/p Ross procedure with native pulmonic valve in aortic position with mild aortic insufficiency and bioprosthetic pulmonic valve with no pulmonic insufficiency, mild MR, aortic upper normal in size.  - Echo (4/13) with EF 55%, mild LVH, mild AI, mild MR, mild RV dilation, bioprosthetic pulmonic valve with peak pressure 25 mmHg.   - Echo (3/14): Mild LV dilation, mild LVH, EF 60%, pulmonary valve in aortic position (s/p Ross) with mild AI and mildly dilated ascending aorta to 4.3 cm, mildly dilated RV with normal systolic function, bioprosthetic PVR with mean gradient 23 mmHg.  Echo (3/15) with EF 55-60%, mild LVH, mild AI with no AS, bioprosthetic pulmonic valve with peak gradient 31 mmHg, ascending aorta 4.4 cm.   - Cardiac MRI/MRA chest (5/15) with mild regurgitation of autograft aortic valve and mild regurgitation of homograft pulmonic valve, dilation of aortic root at sinuses of valsalva (4.8 cm), EF 60%, no LGE, mild RV dilation with normal RV systolic function.   - Echo (5/16) with EF 55-60%, mild autograft AI, mild MR, mildly decreased RV systolic function, biatrial enlargement, RVSP 35 mmHg, trivially increased gradient across bioprosthetic pulmonic valve (no progression).  - Echo (8/17): EF 55-60%, pulmonary autograft in aortic valve position, mild AI, bioprosthetic pulmonary valve with mild PS (peak 22 mmHg), moderate RV dilation with normal systolic function.  - Echo (8/18): EF  40-45%, mildly increased gradient across bioprosthetic pulmonary valve (mean 10 mmHg), s/p Ross procedure with no stenosis or significant regurgitation of pulmonary autograft in aortic position.  - Echo (8/21): EF 50%, RV mildly enlarged with mildly decreased systolic function, pulmonary valve autograft in aortic position with mild regurgitation but no stenosis, bioprosthetic pulmonary valve with no significant stenosis or regurgitation, ascending aorta 5.3 cm.  - TEE (8/21): EF 40%, mildly decreased RV function, aortic valve replaced by pulmonary autograft with mild AI and no AS, bioprosthetic PV looked stable. - Echo (10/21): EF improved to 55-60%, mild RV dilation with mildly decreased systolic function, bioprosthetic PV with peak gradient 31 mmHg (mild PS), pulmonary autograft in aortic position with mild regurgitation and not stenosis.  - Echo (12/22): EF 50%, mild LV dilation, mildly decreased RV systolic function, pulmonary autograft in aortic position with mild regurgitation and no stenosis, bioprosthetic pulmonary valve with no significant stenosis or regurgitation, PASP 28 mmHg. 2. Post-operative atrial fibrillation after heart surgery.  3. Normal left heart cath prior to 1994 heart surgery.  4. Left brachial plexopathy with left shoulder muscle atrophy.  Uncertain etiology.  5. BPH 6. Hyperlipidemia.  7. Cervical spinal stenosis with radiculopathy.   8. Left  occipital cavernoma with a small area of chronic hemorrhage.  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  9. ETT-Sestamibi (3/14): No ischemia or infarction.  10. Carotid dopplers (6/14): mild disease only.  11. Sinus of valsalva aneurysm: MRA chest 2/18 with dilation of pulmonary autograft at level of sinuses of Valsalva to 5.4 cm when measured into left cusp.  - MRA chest (3/19): Saccular dilatation of the left and right sinuses of valsalva, 5.7 cm aortic root diameter.   - MRA chest (6/20): 6 cm dilated sinuses of valsalva, 4.6 cm  ascending aorta.  - MRA chest (6/21): aortic root 5.4 cm, no change from prior - MRA chest (6/22): aortic root 5.4 cm.  12. Elevated left hemidiaphragm.  13. Atrial flutter: Atypical.  Noted in 8/21 => underwent DCCV to NSR.  14. Presyncope: Zio monitor 12/22 with short SVT and NSVT runs, no significant arrhythmias.  FH: Prostate CA, HTN  SH: Lives in [place], owns a window washing business, married, nonsmoker.   ROS: All systems reviewed and negative except as per HPI.    Current Outpatient Medications  Medication Sig Dispense Refill   apixaban (ELIQUIS) 5 MG TABS tablet Take 1 tablet (5 mg total) by mouth 2 (two) times daily. 180 tablet 3   cetirizine (ZYRTEC) 10 MG tablet TAKE 1 TABLET(10 MG) BY MOUTH DAILY 90 tablet 0   empagliflozin (JARDIANCE) 10 MG TABS tablet Take 1 tablet (10 mg total) by mouth daily before breakfast. 30 tablet 3   finasteride (PROSCAR) 5 MG tablet TAKE 1 TABLET(5 MG) BY MOUTH DAILY 90 tablet 1   fluticasone (FLONASE) 50 MCG/ACT nasal spray Place 2 sprays into both nostrils as needed for allergies or rhinitis. 50 mL 30   furosemide (LASIX) 20 MG tablet TAKE 1 TABLET(20 MG) BY MOUTH EVERY OTHER DAY 15 tablet 11   metoprolol succinate (TOPROL-XL) 100 MG 24 hr tablet Take 1 tablet (100 mg total) by mouth 2 (two) times daily. 180 tablet 3   pantoprazole (PROTONIX) 40 MG tablet TAKE 1 TABLET(40 MG) BY MOUTH DAILY 90 tablet 1   rosuvastatin (CRESTOR) 40 MG tablet TAKE 1 TABLET(40 MG) BY MOUTH DAILY 90 tablet 3   sacubitril-valsartan (ENTRESTO) 49-51 MG Take 1 tablet by mouth 2 (two) times daily. 180 tablet 3   spironolactone (ALDACTONE) 25 MG tablet Take 1 tablet (25 mg total) by mouth daily. 30 tablet 6   No current facility-administered medications for this encounter.   Wt Readings from Last 3 Encounters:  12/30/21 103.3 kg (227 lb 12.8 oz)  12/09/21 105.2 kg (232 lb)  10/09/21 105.1 kg (231 lb 9.6 oz)   BP 118/80   Pulse (!) 51   Wt 103.3 kg (227 lb 12.8  oz)   SpO2 98%   BMI 28.47 kg/m  General:  NAD. No resp difficulty HEENT: Normal Neck: Supple. No JVD. Carotids 2+ bilat; no bruits. No lymphadenopathy or thryomegaly appreciated. Cor: PMI nondisplaced. Regular rate & rhythm. No rubs, gallops or murmurs; split S2 Lungs: Clear Abdomen: Soft, nontender, nondistended. No hepatosplenomegaly. No bruits or masses. Good bowel sounds. Extremities: No cyanosis, clubbing, rash, edema Neuro: Alert & oriented x 3, cranial nerves grossly intact. Moves all 4 extremities w/o difficulty. Affect pleasant.  Assessment/Plan: 1. Atrial flutter:  Likely related to prior cardiac surgery.  He had peri-operative AF at time of heart surgery but had not been noted to have recurrent arrhythmias until 8/21.  In 8/21, he was noted to be in atrial flutter with RVR,  based on review of HR over the past few months, he had probably been in atrial flutter since at least 5/21. He had DCCV in 8/21 and is regular on exam today. He saw Dr. Rayann Heman, ablation offered if flutter recurs.  - Continue Eliquis 5 mg bid.  - Continue Toprol XL 100 mg bid.  2.  History of Ross Procedure: Echo in 12/22 showed pulmonary autograft in aortic position with mild AI, no AS.  The bioprosthetic pulmonary valve appeared normal.   - Needs endocarditis prophylaxis with dental work.   3.  Aortic root aneurym: This not uncommonly accompanies Ross procedure.  The pulmonary autograft at sinuses of valsalva measured 5.4 cm by MRA chest in 6/22.  Followed by [facility]. - Per Dr. Roxan Hockey, would hold off on elective repair unless there is significant AI.   - Goal SBP 120s or lower.  4. HTN: Given aneurysm, need to make sure that BP is well-controlled. BP stable today, 118/80, however he continues with low home readings and lightheadedness. - Decrease Entresto to 24/26 mg bid. I asked him to check  BP and notify if sBP >120. - Continue spironolactone, Toprol XL.   5. Hyperlipidemia: Continue current Crestor,  good lipids in 3/23.   6. Chronic systolic CHF: TEE in 5/46 in setting of long-standing atrial flutter with RVR showed EF down to 40%. 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  He was cardioverted in 8/21 but developed exertional dyspnea with volume overload afterwards.  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  Echo in 12/22 showed EF 50% with mild RV dysfunction. He is not volume overloaded on exam, NYHA class I-II symptoms now.     - Continue Lasix 20 mg qod. Recent labs ok. - Continue Jardiance 10 mg daily. - Continue Toprol XL.  - Decrease Entresto as above. - Continue spironolactone 25 mg daily. 7. OSA: Severe by sleep study.  Refuses CPAP.  I suspect this is a major contributor to his daytime sleepiness and fatigue.  8. Diabetes: on metformin and empaglifozin.  9. Headache: Advised follow up with PCP.   Follow up with Dr. Aundra Dubin as scheduled.  Maricela [facility] FNP-BC 12/30/2021

## 2021-12-30 NOTE — Patient Instructions (Signed)
Medication Changes:  Please take Entresto 24/26 mg 1 tablet twice a day.  Lab Work:  None  Testing/Procedures:  None  Referrals:  None  Special Instructions // Education:  None  Follow-Up in: Please keep your follow up with Dr. Aundra Dubin.  At the [facility], you and your health needs are our priority. We have a designated team specialized in the treatment of Heart Failure. This Care Team includes your primary Heart Failure Specialized Cardiologist (physician), Advanced Practice Providers (APPs- Physician Assistants and Nurse Practitioners), and Pharmacist who all work together to provide you with the care you need, when you need it.   You may see any of the following providers on your designated Care Team at your next follow up:  Dr Glori Bickers Dr Haynes Kerns, NP Lyda Jester, [place] [facility] [place], [place] Audry Riles, PharmD   Please be sure to bring in all your medications bottles to every appointment.   Need to Contact us:  If you have any questions or concerns before your next appointment please send [place] a message through [place] or call our office at [phone].    TO LEAVE A MESSAGE FOR THE NURSE SELECT OPTION 2, PLEASE LEAVE A MESSAGE INCLUDING: YOUR NAME DATE OF BIRTH CALL BACK NUMBER REASON FOR CALL**this is important as we prioritize the call backs  YOU WILL RECEIVE A CALL BACK THE SAME DAY AS LONG AS YOU CALL BEFORE 4:00 PM

## 2021-12-31 ENCOUNTER — Ambulatory Visit (INDEPENDENT_AMBULATORY_CARE_PROVIDER_SITE_OTHER): Payer: HMO | Admitting: Family Medicine

## 2021-12-31 ENCOUNTER — Encounter: Payer: Self-pay | Admitting: Family Medicine

## 2021-12-31 VITALS — BP 115/57 | HR 64 | Temp 97.3°F | Ht 75.0 in | Wt 227.2 lb

## 2021-12-31 DIAGNOSIS — K219 Gastro-esophageal reflux disease without esophagitis: Secondary | ICD-10-CM | POA: Diagnosis not present

## 2021-12-31 DIAGNOSIS — E118 Type 2 diabetes mellitus with unspecified complications: Secondary | ICD-10-CM

## 2021-12-31 DIAGNOSIS — J309 Allergic rhinitis, unspecified: Secondary | ICD-10-CM | POA: Insufficient documentation

## 2021-12-31 DIAGNOSIS — J301 Allergic rhinitis due to pollen: Secondary | ICD-10-CM | POA: Diagnosis not present

## 2021-12-31 DIAGNOSIS — J3089 Other allergic rhinitis: Secondary | ICD-10-CM | POA: Insufficient documentation

## 2021-12-31 MED ORDER — AZELASTINE HCL 0.1 % NA SOLN: 2.0000 | Freq: Two times a day (BID) | NASAL | 12 refills | Status: DC

## 2021-12-31 MED ORDER — MONTELUKAST SODIUM 10 MG PO TABS: 10.0000 mg | ORAL_TABLET | Freq: Every day | ORAL | 3 refills | Status: DC

## 2021-12-31 MED ORDER — PANTOPRAZOLE SODIUM 40 MG PO TBEC: 40.0000 mg | DELAYED_RELEASE_TABLET | Freq: Two times a day (BID) | ORAL | 1 refills | Status: DC

## 2021-12-31 NOTE — Assessment & Plan Note (Signed)
Deteriorated.  Likely due to all the PND.  Will increase Protonix to '40mg'$  BID.  Reviewed dietary and lifestyle modifications to help improve sxs.  Pt expressed understanding and is in agreement w/ plan.

## 2021-12-31 NOTE — Assessment & Plan Note (Signed)
Ongoing issue for pt.  Currently on Jardiance '10mg'$  daily w/o difficulty.  Has been pleased w/ the associated weight loss.  UTD on eye exam, foot exam.  On ARB for renal protection.  Encouraged low carb diet, regular physical activity.  Check labs.  Adjust meds prn

## 2021-12-31 NOTE — Assessment & Plan Note (Signed)
Deteriorated.  Pt is having a much more difficult time this year as the allergy season drags on.  Already on Zyrtec and Flonase.  Will add Singulair and Astelin nasal spray.  Reviewed supportive care and red flags that should prompt return.  Pt expressed understanding and is in agreement w/ plan.

## 2021-12-31 NOTE — Progress Notes (Signed)
   Subjective:    Patient ID: [name] [name], male    DOB: 1951-07-03, 71 y.o.   MRN: [id number]  HPI DM- chronic problem, on Jardiance '10mg'$  daily.  UTD on eye exam, foot exam.  On ARB for renal protection.  Denies CP, SOB, HAs, visual changes.    Allergies- 'super hyper-allergic to anything'  Pt reports he is having episodes of coughing/sneezing that triggers laryngospasm which is 'scary as hell'  Currently on Zyrtec and Flonase.    GERD- pt reports sxs have worsened w/ all the allergy drainage.  Currently on Pantoprazole in the AM.  Has added an extra leftover Omeprazole before bed which has helped.  Pt has GI appt upcoming in 2 weeks   Review of Systems For ROS see HPI     Objective:   Physical Exam Vitals reviewed.  Constitutional:      General: He is not in acute distress.    Appearance: Normal appearance. He is not ill-appearing.  HENT:     Head: Normocephalic and atraumatic.  Eyes:     Extraocular Movements: Extraocular movements intact.     Conjunctiva/sclera: Conjunctivae normal.  Cardiovascular:     Rate and Rhythm: Normal rate.  Pulmonary:     Effort: Pulmonary effort is normal. No respiratory distress.  Skin:    General: Skin is warm and dry.  Neurological:     General: No focal deficit present.     Mental Status: He is alert and oriented to person, place, and time.     Cranial Nerves: No cranial nerve deficit.     Gait: Gait normal.  Psychiatric:        Mood and Affect: Mood normal.        Behavior: Behavior normal.        Thought Content: Thought content normal.          Assessment & Plan:

## 2021-12-31 NOTE — Patient Instructions (Addendum)
Follow up in 3-4 months to recheck diabetes We'll notify you of your lab results and make any changes if needed ADD the Montelukast nightly to help w/ allergies CONTINUE the Zyrtec and Flonase ADD Azelastine nasal spray daily Drink LOTS of fluids to rinse the post-nasal drip off the back of your throat INCREASE the Pantoprazole to twice daily Call with any questions or concerns Hang in there!!!

## 2022-01-01 ENCOUNTER — Telehealth (HOSPITAL_COMMUNITY): Payer: Self-pay

## 2022-01-01 DIAGNOSIS — I5022 Chronic systolic (congestive) heart failure: Secondary | ICD-10-CM

## 2022-01-01 LAB — HEMOGLOBIN A1C: Hgb A1c MFr Bld: 7.6 % — ABNORMAL HIGH (ref 4.6–6.5)

## 2022-01-01 MED ORDER — ENTRESTO 24-26 MG PO TABS: 1.0000 | ORAL_TABLET | Freq: Two times a day (BID) | ORAL | 3 refills | Status: DC

## 2022-01-01 NOTE — Telephone Encounter (Signed)
Patient's Entresto refill has been sent to the correct pharmacy.

## 2022-01-16 ENCOUNTER — Telehealth: Payer: Self-pay | Admitting: *Deleted

## 2022-01-16 ENCOUNTER — Other Ambulatory Visit: Payer: Self-pay | Admitting: Physician Assistant

## 2022-01-16 DIAGNOSIS — Z952 Presence of prosthetic heart valve: Secondary | ICD-10-CM | POA: Diagnosis not present

## 2022-01-16 DIAGNOSIS — I700 Atherosclerosis of aorta: Secondary | ICD-10-CM

## 2022-01-16 DIAGNOSIS — K219 Gastro-esophageal reflux disease without esophagitis: Secondary | ICD-10-CM | POA: Diagnosis not present

## 2022-01-16 DIAGNOSIS — R1310 Dysphagia, unspecified: Secondary | ICD-10-CM | POA: Diagnosis not present

## 2022-01-16 DIAGNOSIS — Z1211 Encounter for screening for malignant neoplasm of colon: Secondary | ICD-10-CM | POA: Diagnosis not present

## 2022-01-16 NOTE — Telephone Encounter (Signed)
   Pre-operative Risk Assessment    Patient Name: DEMONTAE ANTUNES  DOB: Jun 30, 1951 MRN: [id number]      Request for Surgical Clearance    Procedure:   ENDOSCOPY   Date of Surgery:  Clearance 03/03/22                                 Surgeon:  DR. [facility] Surgeon's Group or Practice Name:  [facility] Phone number:  [phone] Fax number:  [phone]   Type of Clearance Requested:   - Medical  - Pharmacy:  Hold Apixaban (Eliquis) x 2 DAYS    Type of Anesthesia:   PROPOFOL   Additional requests/questions:    Jiles Prows   01/16/2022, 12:51 PM

## 2022-01-19 ENCOUNTER — Other Ambulatory Visit (HOSPITAL_COMMUNITY): Payer: Self-pay | Admitting: *Deleted

## 2022-01-19 ENCOUNTER — Encounter (HOSPITAL_COMMUNITY): Payer: Self-pay | Admitting: Cardiology

## 2022-01-19 DIAGNOSIS — I700 Atherosclerosis of aorta: Secondary | ICD-10-CM | POA: Insufficient documentation

## 2022-01-19 MED ORDER — LOSARTAN POTASSIUM 25 MG PO TABS: 12.5000 mg | ORAL_TABLET | Freq: Every day | ORAL | 3 refills | Status: DC

## 2022-01-19 NOTE — Telephone Encounter (Signed)
   Primary Cardiologist: Loralie Champagne, MD  Chart reviewed as part of pre-operative protocol coverage. Given past medical history and time since last visit, based on ACC/AHA guidelines, [name] [name] would be at acceptable risk for the planned procedure without further cardiovascular testing.   Patient with diagnosis of aflutter on Eliquis for anticoagulation.     Procedure: endoscopy Date of procedure: 03/03/22   CHA2DS2-VASc Score = 5  This indicates a 7.2% annual risk of stroke. The patient's score is based upon: CHF History: 1 HTN History: 1 Diabetes History: 1 Stroke History: 0 Vascular Disease History: 1 Age Score: 1 Gender Score: 0   CrCl >166m/min Platelet count 238K   Per office protocol, patient can hold Eliquis for 2 days prior to procedure as requested.  I will route this recommendation to the requesting party via Epic fax function and remove from pre-op pool.  Please call with questions.  JJossie Ng Zareya Tuckett NP-C    01/19/2022, 11:09 AM [email or web address] Office [phone] [phone]

## 2022-01-19 NOTE — Telephone Encounter (Signed)
Patient with diagnosis of aflutter on Eliquis for anticoagulation.    Procedure: endoscopy Date of procedure: 03/03/22  CHA2DS2-VASc Score = 5  This indicates a 7.2% annual risk of stroke. The patient's score is based upon: CHF History: 1 HTN History: 1 Diabetes History: 1 Stroke History: 0 Vascular Disease History: 1 Age Score: 1 Gender Score: 0   CrCl >132m/min Platelet count 238K  Per office protocol, patient can hold Eliquis for 2 days prior to procedure as requested.

## 2022-01-20 ENCOUNTER — Ambulatory Visit
Admission: RE | Admit: 2022-01-20 | Discharge: 2022-01-20 | Disposition: A | Discharge status: Home / Self Care | Payer: HMO | Source: Ambulatory Visit | Attending: Physician Assistant | Admitting: Physician Assistant

## 2022-01-20 DIAGNOSIS — K219 Gastro-esophageal reflux disease without esophagitis: Secondary | ICD-10-CM | POA: Diagnosis not present

## 2022-01-20 DIAGNOSIS — K225 Diverticulum of esophagus, acquired: Secondary | ICD-10-CM | POA: Diagnosis not present

## 2022-01-20 DIAGNOSIS — R1310 Dysphagia, unspecified: Secondary | ICD-10-CM

## 2022-01-23 ENCOUNTER — Other Ambulatory Visit: Payer: HMO

## 2022-01-29 ENCOUNTER — Telehealth (HOSPITAL_COMMUNITY): Payer: Self-pay | Admitting: Pharmacy Technician

## 2022-01-29 ENCOUNTER — Other Ambulatory Visit (HOSPITAL_COMMUNITY): Payer: Self-pay

## 2022-01-29 ENCOUNTER — Other Ambulatory Visit (HOSPITAL_COMMUNITY): Payer: Self-pay | Admitting: *Deleted

## 2022-01-29 MED ORDER — EMPAGLIFLOZIN 10 MG PO TABS
10.0000 mg | ORAL_TABLET | Freq: Every day | ORAL | 3 refills | Status: DC
  Filled 2022-01-29: qty 90, 90d supply, fill #0
  Filled 2022-04-23: qty 90, 90d supply, fill #1
  Filled 2022-07-21: qty 90, 90d supply, fill #2
  Filled 2022-10-26: qty 90, 90d supply, fill #3

## 2022-01-29 NOTE — Telephone Encounter (Signed)
Advanced Heart Failure Patient Advocate Encounter  Patient reached out via email to discuss [place] and problems billing at [employer]. [facility] will not bill secondary plans for Medicare. Called and spoke with the patient. Sent Jasmine (Collins) a request to send 90 days of Jardiance for mailing. Fatima Sanger information added to [employer] will be mailed out.  Charlann Boxer, CPhT

## 2022-01-30 ENCOUNTER — Ambulatory Visit (HOSPITAL_COMMUNITY)
Admission: RE | Admit: 2022-01-30 | Discharge: 2022-01-30 | Disposition: A | Discharge status: Home / Self Care | Payer: HMO | Source: Ambulatory Visit | Attending: Internal Medicine | Admitting: Internal Medicine

## 2022-01-30 DIAGNOSIS — I5022 Chronic systolic (congestive) heart failure: Secondary | ICD-10-CM | POA: Insufficient documentation

## 2022-01-30 LAB — BASIC METABOLIC PANEL
Anion gap: 7 (ref 5–15)
BUN: 17 mg/dL (ref 8–23)
CO2: 24 mmol/L (ref 22–32)
Calcium: 9.2 mg/dL (ref 8.9–10.3)
Chloride: 106 mmol/L (ref 98–111)
Creatinine, Ser: 0.8 mg/dL (ref 0.61–1.24)
GFR, Estimated: >60 mL/min (ref >60)
Glucose, Bld: 171 mg/dL — ABNORMAL HIGH (ref 70–99)
Potassium: 4.7 mmol/L (ref 3.5–5.1)
Sodium: 137 mmol/L (ref 135–145)

## 2022-01-30 LAB — BRAIN NATRIURETIC PEPTIDE: B Natriuretic Peptide: 160.8 pg/mL — ABNORMAL HIGH (ref 0.0–100.0)

## 2022-02-02 ENCOUNTER — Other Ambulatory Visit: Payer: Self-pay | Admitting: *Deleted

## 2022-02-02 DIAGNOSIS — I7121 Aneurysm of the ascending aorta, without rupture: Secondary | ICD-10-CM

## 2022-02-09 ENCOUNTER — Ambulatory Visit
Admission: RE | Admit: 2022-02-09 | Discharge: 2022-02-09 | Disposition: A | Discharge status: Home / Self Care | Payer: HMO | Source: Ambulatory Visit | Attending: Thoracic Surgery (Cardiothoracic Vascular Surgery) | Admitting: Thoracic Surgery (Cardiothoracic Vascular Surgery)

## 2022-02-09 DIAGNOSIS — I712 Thoracic aortic aneurysm, without rupture, unspecified: Secondary | ICD-10-CM | POA: Diagnosis not present

## 2022-02-09 DIAGNOSIS — I517 Cardiomegaly: Secondary | ICD-10-CM | POA: Diagnosis not present

## 2022-02-09 DIAGNOSIS — I7121 Aneurysm of the ascending aorta, without rupture: Secondary | ICD-10-CM

## 2022-02-09 MED ORDER — GADOBENATE DIMEGLUMINE 529 MG/ML IV SOLN
20.0000 mL | Freq: Once | INTRAVENOUS | Status: AC | PRN
  Administered 2022-02-09: 20 mL via INTRAVENOUS

## 2022-02-13 ENCOUNTER — Other Ambulatory Visit: Payer: HMO

## 2022-02-14 ENCOUNTER — Other Ambulatory Visit (HOSPITAL_COMMUNITY): Payer: Self-pay | Admitting: Cardiology

## 2022-02-16 MED ORDER — SPIRONOLACTONE 25 MG PO TABS: ORAL_TABLET | ORAL | 3 refills | Status: DC

## 2022-02-16 NOTE — Addendum Note (Signed)
Addended by: Scarlette Calico on: 02/16/2022 05:06 PM   Modules accepted: Orders

## 2022-02-20 ENCOUNTER — Other Ambulatory Visit: Payer: Self-pay

## 2022-02-20 DIAGNOSIS — G540 Brachial plexus disorders: Secondary | ICD-10-CM

## 2022-02-20 MED ORDER — FINASTERIDE 5 MG PO TABS: ORAL_TABLET | ORAL | 1 refills | Status: DC

## 2022-03-02 ENCOUNTER — Encounter (HOSPITAL_COMMUNITY): Payer: Self-pay | Admitting: Emergency Medicine

## 2022-03-02 ENCOUNTER — Emergency Department (HOSPITAL_COMMUNITY)
Admission: EM | Admit: 2022-03-02 | Discharge: 2022-03-02 | Disposition: A | Discharge status: Home / Self Care | Payer: HMO | Attending: Emergency Medicine | Admitting: Emergency Medicine

## 2022-03-02 ENCOUNTER — Other Ambulatory Visit: Payer: Self-pay

## 2022-03-02 DIAGNOSIS — I509 Heart failure, unspecified: Secondary | ICD-10-CM | POA: Diagnosis not present

## 2022-03-02 DIAGNOSIS — Z7901 Long term (current) use of anticoagulants: Secondary | ICD-10-CM | POA: Diagnosis not present

## 2022-03-02 DIAGNOSIS — K225 Diverticulum of esophagus, acquired: Secondary | ICD-10-CM | POA: Diagnosis not present

## 2022-03-02 DIAGNOSIS — Q396 Congenital diverticulum of esophagus: Secondary | ICD-10-CM

## 2022-03-02 DIAGNOSIS — J392 Other diseases of pharynx: Secondary | ICD-10-CM | POA: Diagnosis present

## 2022-03-02 LAB — CBC WITH DIFFERENTIAL/PLATELET
Abs Immature Granulocytes: 0.04 10*3/uL (ref 0.00–0.07)
Basophils Absolute: 0 10*3/uL (ref 0.0–0.1)
Basophils Relative: 0 %
Eosinophils Absolute: 0 10*3/uL (ref 0.0–0.5)
Eosinophils Relative: 0 %
HCT: 45.6 % (ref 39.0–52.0)
Hemoglobin: 15.1 g/dL (ref 13.0–17.0)
Immature Granulocytes: 0 %
Lymphocytes Relative: 17 %
Lymphs Abs: 1.7 10*3/uL (ref 0.7–4.0)
MCH: 29.5 pg (ref 26.0–34.0)
MCHC: 33.1 g/dL (ref 30.0–36.0)
MCV: 89.1 fL (ref 80.0–100.0)
Monocytes Absolute: 0.9 10*3/uL (ref 0.1–1.0)
Monocytes Relative: 8 %
Neutro Abs: 7.7 10*3/uL (ref 1.7–7.7)
Neutrophils Relative %: 75 %
Platelets: 259 10*3/uL (ref 150–400)
RBC: 5.12 MIL/uL (ref 4.22–5.81)
RDW: 13.2 % (ref 11.5–15.5)
WBC: 10.3 10*3/uL (ref 4.0–10.5)
nRBC: 0 % (ref 0.0–0.2)

## 2022-03-02 LAB — BASIC METABOLIC PANEL
Anion gap: 11 (ref 5–15)
BUN: 15 mg/dL (ref 8–23)
CO2: 22 mmol/L (ref 22–32)
Calcium: 9.5 mg/dL (ref 8.9–10.3)
Chloride: 104 mmol/L (ref 98–111)
Creatinine, Ser: 0.88 mg/dL (ref 0.61–1.24)
GFR, Estimated: >60 mL/min (ref >60)
Glucose, Bld: 138 mg/dL — ABNORMAL HIGH (ref 70–99)
Potassium: 4.1 mmol/L (ref 3.5–5.1)
Sodium: 137 mmol/L (ref 135–145)

## 2022-03-02 NOTE — ED Triage Notes (Addendum)
Pt had a barium swallow and found a diverticulum in the upper portion of his esophagus. Pt was supposed to have surgery and it has been postponed due to finding the right surgeon. He states he cannot clear his throat, feels like his throat is swollen, having trouble swallowing. PT states he has spasms where he cannot breathe deeply. Feels like his throat is closing in slowly. Pt denies sob at this moment.

## 2022-03-02 NOTE — Discharge Instructions (Signed)
You were seen today due to difficulty swallowing likely due to your esophageal diverticulum.  Please follow-up with ENT or with cardiothoracic surgery.  I have provided ENT on-call information.  If you become unable to swallow liquids or develop difficulty breathing, please return for evaluation.

## 2022-03-02 NOTE — ED Notes (Signed)
Patient verbalizes understanding of discharge instructions. Opportunity for questioning and answers were provided. Armband removed by staff, pt discharged from ED. Pt ambulatory to ED waiting room with steady gait.  

## 2022-03-02 NOTE — ED Provider Notes (Signed)
[facility] EMERGENCY DEPARTMENT Provider Note   CSN: [id number] Arrival date & time: 03/02/22  1225     History  No chief complaint on file.   [name] [name] is a 71 y.o. male.  Patient presents to hospital complaining of difficulty clearing his throat, feeling of his throat swelling, and having trouble swallowing.  He states that he had a barium swallow study recently which found a diverticulum in the upper portion of his esophagus.  He has not met with a Psychologist, sport and exercise.  Patient states that he has spasms where he cannot breathe deeply and states that today this has seemed worse.  He states that he has been taking Zantac since the diagnosis with some relief.  He denies shortness of breath, abdominal pain, nausea, vomiting.  Endorses cough which seems to originate in his throat, mild difficulty swallowing.  Patient has history of esophageal diverticulum, hiatal hernia, esophageal stricture, hyperlipidemia, CHF, GERD, a flutter on Eliquis, aortic root aneurysm followed by cardiology  HPI     Home Medications Prior to Admission medications   Medication Sig Start Date End Date Taking? Authorizing Provider  apixaban (ELIQUIS) 5 MG TABS tablet Take 1 tablet (5 mg total) by mouth 2 (two) times daily. 10/16/21  Yes [name], MD  azelastine (ASTELIN) 0.1 % nasal spray Place 2 sprays into both nostrils 2 (two) times daily. 12/31/21  Yes [name], MD  cetirizine (ZYRTEC) 10 MG tablet TAKE 1 TABLET(10 MG) BY MOUTH DAILY Patient taking differently: Take 10 mg by mouth daily. 04/24/21  Yes [name], MD  empagliflozin (JARDIANCE) 10 MG TABS tablet Take 1 tablet by mouth daily before breakfast. 01/29/22  Yes [name], MD  finasteride (PROSCAR) 5 MG tablet TAKE 1 TABLET(5 MG) BY MOUTH DAILY Patient taking differently: Take 5 mg by mouth daily. 02/20/22  Yes [name], MD  fluticasone (FLONASE) 50 MCG/ACT nasal spray Place 2 sprays into both  nostrils as needed for allergies or rhinitis. 12/22/21  Yes [name], MD  furosemide (LASIX) 20 MG tablet TAKE 1 TABLET(20 MG) BY MOUTH EVERY OTHER DAY Patient taking differently: Take 20 mg by mouth every other day. 11/13/21  Yes [name], MD  loperamide (IMODIUM) 2 MG capsule Take 2 mg by mouth daily as needed for diarrhea or loose stools.   Yes [provider]  losartan (COZAAR) 25 MG tablet Take 0.5 tablets (12.5 mg total) by mouth daily. 01/19/22  Yes [name], MD  metoprolol succinate (TOPROL-XL) 100 MG 24 hr tablet Take 1 tablet (100 mg total) by mouth 2 (two) times daily. 02/28/21  Yes [name], MD  montelukast (SINGULAIR) 10 MG tablet Take 1 tablet (10 mg total) by mouth at bedtime. 12/31/21  Yes [name], MD  pantoprazole (PROTONIX) 40 MG tablet Take 1 tablet (40 mg total) by mouth 2 (two) times daily. 12/31/21  Yes [name], MD  ranitidine (ZANTAC 75) 75 MG tablet Take 75 mg by mouth 2 (two) times daily.   Yes [provider]  rosuvastatin (CRESTOR) 40 MG tablet TAKE 1 TABLET(40 MG) BY MOUTH DAILY Patient taking differently: Take 40 mg by mouth daily. 09/15/21  Yes [name], MD  spironolactone (ALDACTONE) 25 MG tablet TAKE 1 TABLET(25 MG) BY MOUTH DAILY Patient taking differently: Take 25 mg by mouth daily. 02/16/22  Yes [name], MD      Allergies    Penicillins, Quinidine, and  Quinolones    Review of Systems   Review of Systems  HENT:  Positive for trouble swallowing. Negative for sore throat.   Respiratory:  Positive for cough. Negative for shortness of breath.   Cardiovascular:  Negative for chest pain.  Gastrointestinal:  Negative for abdominal pain.    Physical Exam Updated Vital Signs BP 135/76   Pulse (!) 57   Temp 98.1 F (36.7 C) (Oral)   Resp 15   Ht '6\' 3"'  (1.905 m)   Wt 103.1 kg   SpO2 100%   BMI 28.41 kg/m  Physical Exam Vitals and nursing note reviewed.  Constitutional:       General: He is not in acute distress. HENT:     Head: Normocephalic and atraumatic.     Mouth/Throat:     Mouth: Mucous membranes are moist. No angioedema.     Tongue: Tongue does not deviate from midline.     Pharynx: Uvula midline. No posterior oropharyngeal erythema or uvula swelling.  Eyes:     Conjunctiva/sclera: Conjunctivae normal.  Cardiovascular:     Rate and Rhythm: Normal rate and regular rhythm.     Pulses: Normal pulses.     Heart sounds: Normal heart sounds.  Pulmonary:     Effort: Pulmonary effort is normal.     Breath sounds: Normal breath sounds.  Abdominal:     Palpations: Abdomen is soft.  Musculoskeletal:        General: Normal range of motion.     Cervical back: Normal range of motion and neck supple.  Skin:    General: Skin is warm and dry.     Capillary Refill: Capillary refill takes less than 2 seconds.  Neurological:     Mental Status: He is alert and oriented to person, place, and time.     ED Results / Procedures / Treatments   Labs (all labs ordered are listed, but only abnormal results are displayed) Labs Reviewed  BASIC METABOLIC PANEL - Abnormal; Notable for the following components:      Result Value   Glucose, Bld 138 (*)    All other components within normal limits  CBC WITH DIFFERENTIAL/PLATELET    EKG None  Radiology No results found.  Procedures Procedures    Medications Ordered in ED Medications - No data to display  ED Course/ Medical Decision Making/ A&P                           Medical Decision Making  The patient presents to the hospital complaining of difficulty swallowing.  Differential includes but is not limited to retropharyngeal abscess peritonsillar abscess, angioedema, esophageal strictures, and others  I reviewed the patient's medical history.  I was able to review recent studies showing barium swallow study which noted a diverticulum in the esophagus.  I was also able to review patient's cardiology  records showing surveillance of aortic root aneurysm  I ordered and reviewed labs.  Grossly normal CBC and BMP  Based on the patient's presentation I see no indication for imaging at this time.  Could consider neck soft tissue study  I requested consultation with ENT.  I discussed the case with Dr. Constance Holster.  Since the patient is able to swallow at this time he recommended outpatient follow-up.  I considered admission for the patient but the patient states he is able to swallow and is able to breathe without difficulty.  No signs of retropharyngeal abscess or peritonsillar abscess  on exam.  No angioedema noted.  He has known history of esophageal diverticulum which is known to be causing some of his problems.  It is unclear as to why he days seemed worse than earlier today's but the patient upon reassessment states that it seems to be getting better.  I believe the patient is stable to discharge home and follow-up outpatient with either ENT or cardiothoracic surgery.  Will provide follow-up information for ENT.  Patient provided return precautions.  Discharged home        Final Clinical Impression(s) / ED Diagnoses Final diagnoses:  Esophageal diverticulum    Rx / DC Orders ED Discharge Orders     None         Ronny Bacon 03/02/22 1507    Regan Lemming, MD 03/02/22 1904

## 2022-03-02 NOTE — ED Provider Triage Note (Signed)
Emergency Medicine Provider Triage Evaluation Note  [name] [name] , a 71 y.o. male  was evaluated in triage.  Pt complains of throat discomfort.  This has been ongoing for several months.  He had a barium swallow study done last month which he says showed esophageal diverticulum.  He was scheduled for surgeon who called him couple days ago told him he was unable to perform surgery and will need to be referred to Dr. Kipp Brood (thoracic surgeon).  He states he still has about 2 weeks until he can be evaluated by him.  Denies any acute worsening in symptoms.  Last p.o. intake was this morning.  Initial blood pressure 163/140.  On my check it was 167/97.  Review of Systems  Positive: As above Negative: As above  Physical Exam  BP (!) 163/140 (BP Location: Left Arm)   Pulse 67   Temp 98.1 F (36.7 C) (Oral)   Resp 17   SpO2 99%  Gen:   Awake, no distress   Resp:  Normal effort  MSK:   Moves extremities without difficulty  Other:    Medical Decision Making  Medically screening exam initiated at 1:10 PM.  Appropriate orders placed.  MAHARI STRAHM was informed that the remainder of the evaluation will be completed by another provider, this initial triage assessment does not replace that evaluation, and the importance of remaining in the ED until their evaluation is complete.     Evlyn Courier, PA-C 03/02/22 1314

## 2022-03-04 NOTE — Telephone Encounter (Signed)
Advanced Heart Failure Patient Advocate Encounter  Sent in updated POI and OOP to [facility] via fax.

## 2022-03-06 ENCOUNTER — Encounter: Payer: Self-pay | Admitting: Family Medicine

## 2022-03-06 DIAGNOSIS — Q396 Congenital diverticulum of esophagus: Secondary | ICD-10-CM

## 2022-03-06 NOTE — Telephone Encounter (Signed)
Pt is reporting increased worsening in sxs and wants to know if another surgeons office could see him sooner than 03/13/22  Please advise

## 2022-03-10 ENCOUNTER — Other Ambulatory Visit: Payer: Self-pay | Admitting: Thoracic Surgery (Cardiothoracic Vascular Surgery)

## 2022-03-10 DIAGNOSIS — K2289 Other specified disease of esophagus: Secondary | ICD-10-CM

## 2022-03-10 DIAGNOSIS — I7121 Aneurysm of the ascending aorta, without rupture: Secondary | ICD-10-CM

## 2022-03-10 NOTE — Progress Notes (Signed)
Ct

## 2022-03-10 NOTE — Progress Notes (Signed)
[address]       [place], [address]             [phone]                    [name] [name] [place] Medical Record #[id number] Date of Birth: 11-15-1950  Referring: Greer Pickerel, MD Primary Care: [name], MD Primary Cardiologist: Loralie Champagne, MD  Chief Complaint:    Chief Complaint  Patient presents with   Consult    Esophageal diverticulum, Barium Swallow 6/20, MRA chest 7/10, CT chest 8/10    History of Present Illness:    [name] [name] 71 y.o. male referred for evaluation of a mid esophageal diverticulum. Hx of Ross procedure in the past.  Since May of this year he has developed a worsening cough and hoarseness.  He states that he has always had issues with throat clearing his entire life.  With his symptoms worsening over this past season, he sought care with his primary care physician who then ordered an esophagram.  This identified the esophageal diverticulum.  He also endorses some reflux symptoms which improved with antiacid medication.  Over the past month he has been unable to lay flat without having some coughing and regurgitation issues.       Past Medical History:  Diagnosis Date   Acute gastritis    Aortic insufficiency    s/p aortic valve replacement in 1994   CHF (congestive heart failure) (HCC)    Diabetes mellitus without complication (HCC)    Early satiety    Esophageal stricture    GERD (gastroesophageal reflux disease)    Headache syndrome 10/18/2018   Hiatal hernia    Hyperlipidemia    Neuropathy    left brachial plexus   Pain, dental     Past Surgical History:  Procedure Laterality Date   AORTIC VALVE REPLACEMENT  1994   CARDIOVERSION N/A 03/14/2020   Procedure: CARDIOVERSION;  Surgeon: [name], MD;  Location: [facility];  Service: Cardiovascular;  Laterality: N/A;   GANGLION CYST EXCISION  JAN 2002   HIATAL HERNIA REPAIR  [place] 2002   TEE WITHOUT CARDIOVERSION N/A 03/14/2020    Procedure: TRANSESOPHAGEAL ECHOCARDIOGRAM (TEE);  Surgeon: [name], MD;  Location: [facility] ENDOSCOPY;  Service: Cardiovascular;  Laterality: N/A;   TONSILLECTOMY     VASECTOMY  DEC 2002    Family History  Problem Relation Age of Onset   Lung cancer Father        hx of smoking    Hypertension Other    Prostate cancer Other    Leukemia Brother        Passed at age of 51   Colon cancer Neg Hx    Pancreatic cancer Neg Hx    Rectal cancer Neg Hx    Stomach cancer Neg Hx      Social History   Tobacco Use  Smoking Status Never  Smokeless Tobacco Never    Social History   Substance and Sexual Activity  Alcohol Use No     Allergies  Allergen Reactions   Penicillins Hives and Swelling   Quinidine Other (See Comments)    Increase heart beat per patient   Quinolones     Unknown reaction     Current Outpatient Medications  Medication Sig Dispense Refill   apixaban (ELIQUIS) 5 MG TABS tablet Take 1 tablet (5 mg total) by mouth 2 (two) times daily. [place]  tablet 3   azelastine (ASTELIN) 0.1 % nasal spray Place 2 sprays into both nostrils 2 (two) times daily. 30 mL 12   cetirizine (ZYRTEC) 10 MG tablet TAKE 1 TABLET(10 MG) BY MOUTH DAILY (Patient taking differently: Take 10 mg by mouth daily.) 90 tablet 0   empagliflozin (JARDIANCE) 10 MG TABS tablet Take 1 tablet by mouth daily before breakfast. 90 tablet 3   famotidine (PEPCID) 40 MG tablet Take 40 mg by mouth daily.     finasteride (PROSCAR) 5 MG tablet TAKE 1 TABLET(5 MG) BY MOUTH DAILY (Patient taking differently: Take 5 mg by mouth daily.) 90 tablet 1   fluticasone (FLONASE) 50 MCG/ACT nasal spray Place 2 sprays into both nostrils as needed for allergies or rhinitis. 50 mL 30   furosemide (LASIX) 20 MG tablet TAKE 1 TABLET(20 MG) BY MOUTH EVERY OTHER DAY (Patient taking differently: Take 20 mg by mouth every other day.) 15 tablet 11   loperamide (IMODIUM) 2 MG capsule Take 2 mg by mouth daily as needed for diarrhea or loose  stools.     losartan (COZAAR) 25 MG tablet Take 0.5 tablets (12.5 mg total) by mouth daily. 45 tablet 3   metoprolol succinate (TOPROL-XL) 100 MG 24 hr tablet Take 1 tablet (100 mg total) by mouth 2 (two) times daily. 180 tablet 3   montelukast (SINGULAIR) 10 MG tablet Take 1 tablet (10 mg total) by mouth at bedtime. 30 tablet 3   pantoprazole (PROTONIX) 40 MG tablet Take 1 tablet (40 mg total) by mouth 2 (two) times daily. 180 tablet 1   ranitidine (ZANTAC 75) 75 MG tablet Take 75 mg by mouth 2 (two) times daily.     rosuvastatin (CRESTOR) 40 MG tablet TAKE 1 TABLET(40 MG) BY MOUTH DAILY (Patient taking differently: Take 40 mg by mouth daily.) 90 tablet 3   spironolactone (ALDACTONE) 25 MG tablet TAKE 1 TABLET(25 MG) BY MOUTH DAILY (Patient taking differently: Take 25 mg by mouth daily.) 90 tablet 3   No current facility-administered medications for this visit.    Review of Systems  Constitutional:  Positive for weight loss. Negative for malaise/fatigue.  Respiratory:  Positive for cough. Negative for shortness of breath.   Cardiovascular: Negative.   Gastrointestinal:  Positive for heartburn. Negative for abdominal pain and vomiting.  Neurological: Negative.      PHYSICAL EXAMINATION: BP 137/78 (BP Location: Right Arm, Patient Position: Sitting)   Pulse (!) 56   Resp 18   Ht '6\' 3"'$  (1.905 m)   Wt 220 lb (99.8 kg)   SpO2 97% Comment: RA  BMI 27.50 kg/m  Physical Exam Constitutional:      General: He is not in acute distress.    Appearance: Normal appearance. He is normal weight. He is not ill-appearing.  Cardiovascular:     Rate and Rhythm: Bradycardia present.  Pulmonary:     Effort: Pulmonary effort is normal. No respiratory distress.  Abdominal:     General: Abdomen is flat. There is no distension.  Musculoskeletal:        General: Normal range of motion.     Cervical back: Normal range of motion.  Skin:    General: Skin is warm and dry.  Neurological:     General: No  focal deficit present.     Mental Status: He is alert and oriented to person, place, and time.    Diagnostic Studies & Laboratory data:     Esophagram: IMPRESSION: 1. Mild dysmotility. 2. Moderate mid esophageal  diverticulum arising from the RIGHT lateral soft Gus. 3. No signs of narrowing or gross lesion on single contrast evaluation. 4. No signs of gastroesophageal reflux or hiatal hernia.     I have independently reviewed the above radiology studies  and reviewed the findings with the patient.   Recent Lab Findings: Lab Results  Component Value Date   WBC 10.3 03/02/2022   HGB 15.1 03/02/2022   HCT 45.6 03/02/2022   PLT 259 03/02/2022   GLUCOSE 138 (H) 03/02/2022   CHOL 134 10/09/2021   TRIG 97 10/09/2021   HDL 43 10/09/2021   LDLDIRECT 110.0 07/18/2020   LDLCALC 72 10/09/2021   ALT 17 01/20/2021   AST 16 01/20/2021   NA 137 03/02/2022   K 4.1 03/02/2022   CL 104 03/02/2022   CREATININE 0.88 03/02/2022   BUN 15 03/02/2022   CO2 22 03/02/2022   TSH 2.362 12/09/2021   HGBA1C 7.6 (H) 12/31/2021      Assessment / Plan:   71 year old male with a mid esophageal diverticulum.  He has evidence of some dysmotility on esophagram.  He will need formal EGD and manometry prior to surgical planning.  He also has a history of a dilated aortic root, but in 1994 he underwent a Ross procedure in [place].  He has been followed with serial imaging.  The most recent CT scan it measured 5.8 cm, however in 2019 it measured 6.3 cm.  On review of his echocardiogram from 2022 he only has mild regurgitation in his autograft in the aortic position.  There is no evidence of bioprosthetic pulmonic valve regurgitation.  He is also on Eliquis this will need to be held for 2 to 3 days prior to surgery.  After he undergoes his EGD, and manometry, he will be scheduled for a right robotic assisted thoracoscopy with esophagomyotomy, diverticulectomy and upper endoscopy.      I  spent 40 minutes  with the patient face to face counseling and coordination of care.    Lajuana Matte 03/13/2022 1:46 PM

## 2022-03-10 NOTE — H&P (View-Only) (Signed)
[address]       [place],[name] [name] [address]             [id number]                    [name] [name] [place] Medical Record #[id number] Date of Birth: 1951-02-25  Referring: [name] Pickerel, MD Primary Care: [name] Minium, MD Primary Cardiologist: [name] Champagne, MD  Chief Complaint:    Chief Complaint  Patient presents with   Consult    Esophageal diverticulum, Barium Swallow 6/20, MRA chest 7/10, CT chest 8/10    History of Present Illness:    [name] [name] 71 y.o. male referred for evaluation of a mid esophageal diverticulum. Hx of Ross procedure in the past.  Since May of this year he has developed a worsening cough and hoarseness.  He states that he has always had issues with throat clearing his entire life.  With his symptoms worsening over this past season, he sought care with his primary care physician who then ordered an esophagram.  This identified the esophageal diverticulum.  He also endorses some reflux symptoms which improved with antiacid medication.  Over the past month he has been unable to lay flat without having some coughing and regurgitation issues.       Past Medical History:  Diagnosis Date   Acute gastritis    Aortic insufficiency    s/p aortic valve replacement in 1994   CHF (congestive heart failure) (HCC)    Diabetes mellitus without complication (HCC)    Early satiety    Esophageal stricture    GERD (gastroesophageal reflux disease)    Headache syndrome 10/18/2018   Hiatal hernia    Hyperlipidemia    Neuropathy    left brachial plexus   Pain, dental     Past Surgical History:  Procedure Laterality Date   AORTIC VALVE REPLACEMENT  1994   CARDIOVERSION N/A 03/14/2020   Procedure: CARDIOVERSION;  Surgeon: [name] Dresser, MD;  Location: [facility];  Service: Cardiovascular;  Laterality: N/A;   GANGLION CYST EXCISION  JAN 2002   HIATAL HERNIA REPAIR  [place] 2002   TEE WITHOUT CARDIOVERSION N/A 03/14/2020    Procedure: TRANSESOPHAGEAL ECHOCARDIOGRAM (TEE);  Surgeon: [name] Dresser, MD;  Location: [facility] ENDOSCOPY;  Service: Cardiovascular;  Laterality: N/A;   TONSILLECTOMY     VASECTOMY  DEC 2002    Family History  Problem Relation Age of Onset   Lung cancer Father        hx of smoking    Hypertension Other    Prostate cancer Other    Leukemia Brother        Passed at age of 70   Colon cancer Neg Hx    Pancreatic cancer Neg Hx    Rectal cancer Neg Hx    Stomach cancer Neg Hx      Social History   Tobacco Use  Smoking Status Never  Smokeless Tobacco Never    Social History   Substance and Sexual Activity  Alcohol Use No     Allergies  Allergen Reactions   Penicillins Hives and Swelling   Quinidine Other (See Comments)    Increase heart beat per patient   Quinolones     Unknown reaction     Current Outpatient Medications  Medication Sig Dispense Refill   apixaban (ELIQUIS) 5 MG TABS tablet Take 1 tablet (5 mg total) by mouth 2 (two) times daily. [place]  tablet 3   azelastine (ASTELIN) 0.1 % nasal spray Place 2 sprays into both nostrils 2 (two) times daily. 30 mL 12   cetirizine (ZYRTEC) 10 MG tablet TAKE 1 TABLET(10 MG) BY MOUTH DAILY (Patient taking differently: Take 10 mg by mouth daily.) 90 tablet 0   empagliflozin (JARDIANCE) 10 MG TABS tablet Take 1 tablet by mouth daily before breakfast. 90 tablet 3   famotidine (PEPCID) 40 MG tablet Take 40 mg by mouth daily.     finasteride (PROSCAR) 5 MG tablet TAKE 1 TABLET(5 MG) BY MOUTH DAILY (Patient taking differently: Take 5 mg by mouth daily.) 90 tablet 1   fluticasone (FLONASE) 50 MCG/ACT nasal spray Place 2 sprays into both nostrils as needed for allergies or rhinitis. 50 mL 30   furosemide (LASIX) 20 MG tablet TAKE 1 TABLET(20 MG) BY MOUTH EVERY OTHER DAY (Patient taking differently: Take 20 mg by mouth every other day.) 15 tablet 11   loperamide (IMODIUM) 2 MG capsule Take 2 mg by mouth daily as needed for diarrhea or loose  stools.     losartan (COZAAR) 25 MG tablet Take 0.5 tablets (12.5 mg total) by mouth daily. 45 tablet 3   metoprolol succinate (TOPROL-XL) 100 MG 24 hr tablet Take 1 tablet (100 mg total) by mouth 2 (two) times daily. 180 tablet 3   montelukast (SINGULAIR) 10 MG tablet Take 1 tablet (10 mg total) by mouth at bedtime. 30 tablet 3   pantoprazole (PROTONIX) 40 MG tablet Take 1 tablet (40 mg total) by mouth 2 (two) times daily. 180 tablet 1   ranitidine (ZANTAC 75) 75 MG tablet Take 75 mg by mouth 2 (two) times daily.     rosuvastatin (CRESTOR) 40 MG tablet TAKE 1 TABLET(40 MG) BY MOUTH DAILY (Patient taking differently: Take 40 mg by mouth daily.) 90 tablet 3   spironolactone (ALDACTONE) 25 MG tablet TAKE 1 TABLET(25 MG) BY MOUTH DAILY (Patient taking differently: Take 25 mg by mouth daily.) 90 tablet 3   No current facility-administered medications for this visit.    Review of Systems  Constitutional:  Positive for weight loss. Negative for malaise/fatigue.  Respiratory:  Positive for cough. Negative for shortness of breath.   Cardiovascular: Negative.   Gastrointestinal:  Positive for heartburn. Negative for abdominal pain and vomiting.  Neurological: Negative.      PHYSICAL EXAMINATION: BP 137/78 (BP Location: Right Arm, Patient Position: Sitting)   Pulse (!) 56   Resp 18   Ht '6\' 3"'$  (1.905 m)   Wt 220 lb (99.8 kg)   SpO2 97% Comment: RA  BMI 27.50 kg/m  Physical Exam Constitutional:      General: He is not in acute distress.    Appearance: Normal appearance. He is normal weight. He is not ill-appearing.  Cardiovascular:     Rate and Rhythm: Bradycardia present.  Pulmonary:     Effort: Pulmonary effort is normal. No respiratory distress.  Abdominal:     General: Abdomen is flat. There is no distension.  Musculoskeletal:        General: Normal range of motion.     Cervical back: Normal range of motion.  Skin:    General: Skin is warm and dry.  Neurological:     General: No  focal deficit present.     Mental Status: He is alert and oriented to person, place, and time.    Diagnostic Studies & Laboratory data:     Esophagram: IMPRESSION: 1. Mild dysmotility. 2. Moderate mid esophageal  diverticulum arising from the RIGHT lateral soft Gus. 3. No signs of narrowing or gross lesion on single contrast evaluation. 4. No signs of gastroesophageal reflux or hiatal hernia.     I have independently reviewed the above radiology studies  and reviewed the findings with the patient.   Recent Lab Findings: Lab Results  Component Value Date   WBC 10.3 03/02/2022   HGB 15.1 03/02/2022   HCT 45.6 03/02/2022   PLT 259 03/02/2022   GLUCOSE 138 (H) 03/02/2022   CHOL 134 10/09/2021   TRIG 97 10/09/2021   HDL 43 10/09/2021   LDLDIRECT 110.0 07/18/2020   LDLCALC 72 10/09/2021   ALT 17 01/20/2021   AST 16 01/20/2021   NA 137 03/02/2022   K 4.1 03/02/2022   CL 104 03/02/2022   CREATININE 0.88 03/02/2022   BUN 15 03/02/2022   CO2 22 03/02/2022   TSH 2.362 12/09/2021   HGBA1C 7.6 (H) 12/31/2021      Assessment / Plan:   71 year old male with a mid esophageal diverticulum.  He has evidence of some dysmotility on esophagram.  He will need formal EGD and manometry prior to surgical planning.  He also has a history of a dilated aortic root, but in 1994 he underwent a Ross procedure in [place].  He has been followed with serial imaging.  The most recent CT scan it measured 5.8 cm, however in 2019 it measured 6.3 cm.  On review of his echocardiogram from 2022 he only has mild regurgitation in his autograft in the aortic position.  There is no evidence of bioprosthetic pulmonic valve regurgitation.  He is also on Eliquis this will need to be held for 2 to 3 days prior to surgery.  After he undergoes his EGD, and manometry, he will be scheduled for a right robotic assisted thoracoscopy with esophagomyotomy, diverticulectomy and upper endoscopy.      I  spent 40 minutes  with the patient face to face counseling and coordination of care.    Lajuana Matte 03/13/2022 1:46 PM

## 2022-03-11 ENCOUNTER — Ambulatory Visit
Admission: RE | Admit: 2022-03-11 | Discharge: 2022-03-11 | Disposition: A | Discharge status: Home / Self Care | Payer: HMO | Source: Ambulatory Visit | Attending: Thoracic Surgery (Cardiothoracic Vascular Surgery) | Admitting: Thoracic Surgery (Cardiothoracic Vascular Surgery)

## 2022-03-11 DIAGNOSIS — I712 Thoracic aortic aneurysm, without rupture, unspecified: Secondary | ICD-10-CM | POA: Diagnosis not present

## 2022-03-11 DIAGNOSIS — R0989 Other specified symptoms and signs involving the circulatory and respiratory systems: Secondary | ICD-10-CM | POA: Diagnosis not present

## 2022-03-11 DIAGNOSIS — J9811 Atelectasis: Secondary | ICD-10-CM | POA: Diagnosis not present

## 2022-03-11 DIAGNOSIS — I700 Atherosclerosis of aorta: Secondary | ICD-10-CM | POA: Diagnosis not present

## 2022-03-11 DIAGNOSIS — K2289 Other specified disease of esophagus: Secondary | ICD-10-CM

## 2022-03-11 MED ORDER — IOPAMIDOL (ISOVUE-300) INJECTION 61%
75.0000 mL | Freq: Once | INTRAVENOUS | Status: AC | PRN
  Administered 2022-03-11: 75 mL via INTRAVENOUS

## 2022-03-13 ENCOUNTER — Institutional Professional Consult (permissible substitution): Payer: HMO | Admitting: Thoracic Surgery (Cardiothoracic Vascular Surgery)

## 2022-03-13 VITALS — BP 137/78 | HR 56 | Resp 18 | Ht 75.0 in | Wt 220.0 lb

## 2022-03-13 DIAGNOSIS — Q396 Congenital diverticulum of esophagus: Secondary | ICD-10-CM

## 2022-03-18 ENCOUNTER — Other Ambulatory Visit: Payer: Self-pay

## 2022-03-18 ENCOUNTER — Encounter: Payer: Self-pay | Admitting: *Deleted

## 2022-03-18 ENCOUNTER — Other Ambulatory Visit: Payer: Self-pay | Admitting: *Deleted

## 2022-03-18 DIAGNOSIS — Q396 Congenital diverticulum of esophagus: Secondary | ICD-10-CM

## 2022-03-18 DIAGNOSIS — R1310 Dysphagia, unspecified: Secondary | ICD-10-CM

## 2022-03-20 ENCOUNTER — Encounter: Payer: HMO | Admitting: Thoracic Surgery (Cardiothoracic Vascular Surgery)

## 2022-03-24 NOTE — Telephone Encounter (Signed)
Advanced Heart Failure Patient Advocate Encounter  Patient was denied BMS assistance due to being over the income limit. Called and spoke with the patient. Advised him we could provide intermittent samples of Eliquis when needed.  Charlann Boxer, CPhT

## 2022-03-25 ENCOUNTER — Other Ambulatory Visit (HOSPITAL_COMMUNITY): Payer: Self-pay

## 2022-03-25 MED ORDER — METOPROLOL SUCCINATE ER 100 MG PO TB24: 100.0000 mg | ORAL_TABLET | Freq: Two times a day (BID) | ORAL | 0 refills | Status: DC

## 2022-03-25 NOTE — Pre-Procedure Instructions (Signed)
Surgical Instructions    Your procedure is scheduled on Monday 03/30/22.   Report to [facility] Main Entrance "A" at 05:30 A.M., then check in with the Admitting office.  Call this number if you have problems the morning of surgery:  [phone]   If you have any questions prior to your surgery date call [phone]: Open Monday-Friday 8am-4pm    Remember:  Do not eat or drink after midnight the night before your surgery     Take these medicines the morning of surgery with A SIP OF WATER:   cetirizine (ZYRTEC)  famotidine (PEPCID)   finasteride (PROSCAR)  metoprolol succinate (TOPROL-XL)  pantoprazole (PROTONIX)  rosuvastatin (CRESTOR)   Take these medicines if needed:   azelastine (ASTELIN)  fluticasone (FLONASE)  Please follow your surgeon's instructions regarding apixaban (ELIQUIS). If you have not received instructions then please contact your surgeon's office for instructions.   As of today, STOP taking any Aspirin (unless otherwise instructed by your surgeon) Aleve, Naproxen, Ibuprofen, Motrin, Advil, Goody's, BC's, all herbal medications, fish oil, and all vitamins.  WHAT DO I DO ABOUT MY DIABETES MEDICATION?   Do not take oral diabetes medicines (pills) the morning of surgery.  DO NOT TAKE empagliflozin (JARDIANCE) starting three days prior to surgery.  DO NOT TAKE 8/25, 8/26, 8/27.  The day of surgery, do not take other diabetes injectables, including Byetta (exenatide), Bydureon (exenatide ER), Victoza (liraglutide), or Trulicity (dulaglutide).   HOW TO MANAGE YOUR DIABETES BEFORE AND AFTER SURGERY  Why is it important to control my blood sugar before and after surgery? Improving blood sugar levels before and after surgery helps healing and can limit problems. A way of improving blood sugar control is eating a healthy diet by:  Eating less sugar and carbohydrates  Increasing activity/exercise  Talking with your doctor about reaching your blood sugar  goals High blood sugars (greater than 180 mg/dL) can raise your risk of infections and slow your recovery, so you will need to focus on controlling your diabetes during the weeks before surgery. Make sure that the doctor who takes care of your diabetes knows about your planned surgery including the date and location.  How do I manage my blood sugar before surgery? Check your blood sugar at least 4 times a day, starting 2 days before surgery, to make sure that the level is not too high or low.  Check your blood sugar the morning of your surgery when you wake up and every 2 hours until you get to the Short Stay unit.  If your blood sugar is less than 70 mg/dL, you will need to treat for low blood sugar: Do not take insulin. Treat a low blood sugar (less than 70 mg/dL) with  cup of clear juice (cranberry or apple), 4 glucose tablets, OR glucose gel. Recheck blood sugar in 15 minutes after treatment (to make sure it is greater than 70 mg/dL). If your blood sugar is not greater than 70 mg/dL on recheck, call [phone] for further instructions. Report your blood sugar to the short stay nurse when you get to Short Stay.  If you are admitted to the hospital after surgery: Your blood sugar will be checked by the staff and you will probably be given insulin after surgery (instead of oral diabetes medicines) to make sure you have good blood sugar levels. The goal for blood sugar control after surgery is 80-180 mg/dL.            Do not wear jewelry or  makeup. Do not wear lotions, powders, perfumes/cologne or deodorant. Do not shave 48 hours prior to surgery.  Men may shave face and neck. Do not bring valuables to the hospital. Do not wear nail polish, gel polish, artificial nails, or any other type of covering on natural nails (fingers and toes) If you have artificial nails or gel coating that need to be removed by a nail salon, please have this removed prior to surgery. Artificial nails or gel  coating may interfere with anesthesia's ability to adequately monitor your vital signs.  [facility] is not responsible for any belongings or valuables.    Do NOT Smoke (Tobacco/Vaping)  24 hours prior to your procedure  If you use a CPAP at night, you may bring your mask for your overnight stay.   Contacts, glasses, hearing aids, dentures or partials may not be worn into surgery, please bring cases for these belongings   For patients admitted to the hospital, discharge time will be determined by your treatment team.   Patients discharged the day of surgery will not be allowed to drive home, and someone needs to stay with them for 24 hours.   SURGICAL WAITING ROOM VISITATION Patients having surgery or a procedure may have no more than 2 support people in the waiting area - these visitors may rotate.   Children under the age of 35 must have an adult with them who is not the patient. If the patient needs to stay at the hospital during part of their recovery, the visitor guidelines for inpatient rooms apply. Pre-op nurse will coordinate an appropriate time for 1 support person to accompany patient in pre-op.  This support person may not rotate.   Please refer to the [facility] website for the visitor guidelines for Inpatients (after your surgery is over and you are in a regular room).    Special instructions:    Oral Hygiene is also important to reduce your risk of infection.  Remember - BRUSH YOUR TEETH THE MORNING OF SURGERY WITH YOUR REGULAR TOOTHPASTE   [place]- Preparing For Surgery  Before surgery, you can play an important role. Because skin is not sterile, your skin needs to be as free of germs as possible. You can reduce the number of germs on your skin by washing with CHG (chlorahexidine gluconate) Soap before surgery.  CHG is an antiseptic cleaner which kills germs and bonds with the skin to continue killing germs even after washing.     Please do not use if you have an  allergy to CHG or antibacterial soaps. If your skin becomes reddened/irritated stop using the CHG.  Do not shave (including legs and underarms) for at least 48 hours prior to first CHG shower. It is OK to shave your face.  Please follow these instructions carefully.     Shower the NIGHT BEFORE SURGERY and the MORNING OF SURGERY with CHG Soap.   If you chose to wash your hair, wash your hair first as usual with your normal shampoo. After you shampoo, rinse your hair and body thoroughly to remove the shampoo.  Then [employer] and genitals (private parts) with your normal soap and rinse thoroughly to remove soap.  After that Use CHG Soap as you would any other liquid soap. You can apply CHG directly to the skin and wash gently with a scrungie or a clean washcloth.   Apply the CHG Soap to your body ONLY FROM THE NECK DOWN.  Do not use on open wounds or  open sores. Avoid contact with your eyes, ears, mouth and genitals (private parts). Wash Face and genitals (private parts)  with your normal soap.   Wash thoroughly, paying special attention to the area where your surgery will be performed.  Thoroughly rinse your body with warm water from the neck down.  DO NOT shower/wash with your normal soap after using and rinsing off the CHG Soap.  Pat yourself dry with a CLEAN TOWEL.  Wear CLEAN PAJAMAS to bed the night before surgery  Place CLEAN SHEETS on your bed the night before your surgery  DO NOT SLEEP WITH PETS.   Day of Surgery:  Take a shower with CHG soap. Wear Clean/Comfortable clothing the morning of surgery Do not apply any deodorants/lotions.   Remember to brush your teeth WITH YOUR REGULAR TOOTHPASTE.    If you received a COVID test during your pre-op visit, it is requested that you wear a mask when out in public, stay away from anyone that may not be feeling well, and notify your surgeon if you develop symptoms. If you have been in contact with anyone that has tested positive in  the last 10 days, please notify your surgeon.    Please read over the following fact sheets that you were given.

## 2022-03-25 NOTE — Pre-Procedure Instructions (Signed)
Surgical Instructions    Your procedure is scheduled on Monday 03/30/22.   Report to [facility] Main Entrance "A" at 05:30 A.M., then check in with the Admitting office.  Call this number if you have problems the morning of surgery:  [phone]   If you have any questions prior to your surgery date call [phone]: Open Monday-Friday 8am-4pm    Remember:  Do not eat or drink after midnight the night before your surgery     Take these medicines the morning of surgery with A SIP OF WATER:   cetirizine (ZYRTEC)  famotidine (PEPCID)   finasteride (PROSCAR)  metoprolol succinate (TOPROL-XL)  pantoprazole (PROTONIX)  rosuvastatin (CRESTOR)   Take these medicines if needed:   azelastine (ASTELIN)  fluticasone (FLONASE)  Please follow your surgeon's instructions regarding apixaban (ELIQUIS). If you have not received instructions then please contact your surgeon's office for instructions.   As of today, STOP taking any Aspirin (unless otherwise instructed by your surgeon) Aleve, Naproxen, Ibuprofen, Motrin, Advil, Goody's, BC's, all herbal medications, fish oil, and all vitamins.  WHAT DO I DO ABOUT MY DIABETES MEDICATION?   Do not take oral diabetes medicines (pills) the morning of surgery.  DO NOT TAKE empagliflozin (JARDIANCE) starting 72 hours before surgery. Last dose Friday 03/27/22.  The day of surgery, do not take other diabetes injectables, including Byetta (exenatide), Bydureon (exenatide ER), Victoza (liraglutide), or Trulicity (dulaglutide).   HOW TO MANAGE YOUR DIABETES BEFORE AND AFTER SURGERY  Why is it important to control my blood sugar before and after surgery? Improving blood sugar levels before and after surgery helps healing and can limit problems. A way of improving blood sugar control is eating a healthy diet by:  Eating less sugar and carbohydrates  Increasing activity/exercise  Talking with your doctor about reaching your blood sugar goals High  blood sugars (greater than 180 mg/dL) can raise your risk of infections and slow your recovery, so you will need to focus on controlling your diabetes during the weeks before surgery. Make sure that the doctor who takes care of your diabetes knows about your planned surgery including the date and location.  How do I manage my blood sugar before surgery? Check your blood sugar at least 4 times a day, starting 2 days before surgery, to make sure that the level is not too high or low.  Check your blood sugar the morning of your surgery when you wake up and every 2 hours until you get to the Short Stay unit.  If your blood sugar is less than 70 mg/dL, you will need to treat for low blood sugar: Do not take insulin. Treat a low blood sugar (less than 70 mg/dL) with  cup of clear juice (cranberry or apple), 4 glucose tablets, OR glucose gel. Recheck blood sugar in 15 minutes after treatment (to make sure it is greater than 70 mg/dL). If your blood sugar is not greater than 70 mg/dL on recheck, call [phone] for further instructions. Report your blood sugar to the short stay nurse when you get to Short Stay.  If you are admitted to the hospital after surgery: Your blood sugar will be checked by the staff and you will probably be given insulin after surgery (instead of oral diabetes medicines) to make sure you have good blood sugar levels. The goal for blood sugar control after surgery is 80-180 mg/dL.            Do not wear jewelry or makeup. Do not wear  lotions, powders, perfumes/cologne or deodorant. Do not shave 48 hours prior to surgery.  Men may shave face and neck. Do not bring valuables to the hospital. Do not wear nail polish, gel polish, artificial nails, or any other type of covering on natural nails (fingers and toes) If you have artificial nails or gel coating that need to be removed by a nail salon, please have this removed prior to surgery. Artificial nails or gel coating may  interfere with anesthesia's ability to adequately monitor your vital signs.  [facility] is not responsible for any belongings or valuables.    Do NOT Smoke (Tobacco/Vaping)  24 hours prior to your procedure  If you use a CPAP at night, you may bring your mask for your overnight stay.   Contacts, glasses, hearing aids, dentures or partials may not be worn into surgery, please bring cases for these belongings   For patients admitted to the hospital, discharge time will be determined by your treatment team.   Patients discharged the day of surgery will not be allowed to drive home, and someone needs to stay with them for 24 hours.   SURGICAL WAITING ROOM VISITATION Patients having surgery or a procedure may have no more than 2 support people in the waiting area - these visitors may rotate.   Children under the age of 43 must have an adult with them who is not the patient. If the patient needs to stay at the hospital during part of their recovery, the visitor guidelines for inpatient rooms apply. Pre-op nurse will coordinate an appropriate time for 1 support person to accompany patient in pre-op.  This support person may not rotate.   Please refer to the [facility] website for the visitor guidelines for Inpatients (after your surgery is over and you are in a regular room).    Special instructions:    Oral Hygiene is also important to reduce your risk of infection.  Remember - BRUSH YOUR TEETH THE MORNING OF SURGERY WITH YOUR REGULAR TOOTHPASTE   [place]- Preparing For Surgery  Before surgery, you can play an important role. Because skin is not sterile, your skin needs to be as free of germs as possible. You can reduce the number of germs on your skin by washing with CHG (chlorahexidine gluconate) Soap before surgery.  CHG is an antiseptic cleaner which kills germs and bonds with the skin to continue killing germs even after washing.     Please do not use if you have an allergy to  CHG or antibacterial soaps. If your skin becomes reddened/irritated stop using the CHG.  Do not shave (including legs and underarms) for at least 48 hours prior to first CHG shower. It is OK to shave your face.  Please follow these instructions carefully.     Shower the NIGHT BEFORE SURGERY and the MORNING OF SURGERY with CHG Soap.   If you chose to wash your hair, wash your hair first as usual with your normal shampoo. After you shampoo, rinse your hair and body thoroughly to remove the shampoo.  Then [employer] and genitals (private parts) with your normal soap and rinse thoroughly to remove soap.  After that Use CHG Soap as you would any other liquid soap. You can apply CHG directly to the skin and wash gently with a scrungie or a clean washcloth.   Apply the CHG Soap to your body ONLY FROM THE NECK DOWN.  Do not use on open wounds or open sores. Avoid contact  with your eyes, ears, mouth and genitals (private parts). Wash Face and genitals (private parts)  with your normal soap.   Wash thoroughly, paying special attention to the area where your surgery will be performed.  Thoroughly rinse your body with warm water from the neck down.  DO NOT shower/wash with your normal soap after using and rinsing off the CHG Soap.  Pat yourself dry with a CLEAN TOWEL.  Wear CLEAN PAJAMAS to bed the night before surgery  Place CLEAN SHEETS on your bed the night before your surgery  DO NOT SLEEP WITH PETS.   Day of Surgery:  Take a shower with CHG soap. Wear Clean/Comfortable clothing the morning of surgery Do not apply any deodorants/lotions.   Remember to brush your teeth WITH YOUR REGULAR TOOTHPASTE.    If you received a COVID test during your pre-op visit, it is requested that you wear a mask when out in public, stay away from anyone that may not be feeling well, and notify your surgeon if you develop symptoms. If you have been in contact with anyone that has tested positive in the last 10  days, please notify your surgeon.    Please read over the following fact sheets that you were given.

## 2022-03-26 ENCOUNTER — Encounter (HOSPITAL_COMMUNITY)
Admission: RE | Admit: 2022-03-26 | Discharge: 2022-03-26 | Disposition: A | Discharge status: Home / Self Care | Payer: HMO | Source: Ambulatory Visit | Attending: Thoracic Surgery (Cardiothoracic Vascular Surgery) | Admitting: Thoracic Surgery (Cardiothoracic Vascular Surgery)

## 2022-03-26 ENCOUNTER — Other Ambulatory Visit: Payer: Self-pay

## 2022-03-26 ENCOUNTER — Encounter (HOSPITAL_COMMUNITY): Payer: Self-pay

## 2022-03-26 VITALS — BP 149/86 | HR 58 | Temp 97.7°F | Resp 17 | Ht 75.0 in | Wt 220.5 lb

## 2022-03-26 DIAGNOSIS — E1140 Type 2 diabetes mellitus with diabetic neuropathy, unspecified: Secondary | ICD-10-CM | POA: Insufficient documentation

## 2022-03-26 DIAGNOSIS — I700 Atherosclerosis of aorta: Secondary | ICD-10-CM | POA: Diagnosis not present

## 2022-03-26 DIAGNOSIS — Z01818 Encounter for other preprocedural examination: Secondary | ICD-10-CM | POA: Insufficient documentation

## 2022-03-26 DIAGNOSIS — I2510 Atherosclerotic heart disease of native coronary artery without angina pectoris: Secondary | ICD-10-CM | POA: Diagnosis not present

## 2022-03-26 DIAGNOSIS — I471 Supraventricular tachycardia: Secondary | ICD-10-CM | POA: Diagnosis not present

## 2022-03-26 DIAGNOSIS — Q396 Congenital diverticulum of esophagus: Secondary | ICD-10-CM | POA: Diagnosis not present

## 2022-03-26 DIAGNOSIS — Z20822 Contact with and (suspected) exposure to covid-19: Secondary | ICD-10-CM | POA: Diagnosis not present

## 2022-03-26 DIAGNOSIS — E785 Hyperlipidemia, unspecified: Secondary | ICD-10-CM | POA: Insufficient documentation

## 2022-03-26 DIAGNOSIS — K219 Gastro-esophageal reflux disease without esophagitis: Secondary | ICD-10-CM | POA: Diagnosis not present

## 2022-03-26 DIAGNOSIS — I440 Atrioventricular block, first degree: Secondary | ICD-10-CM | POA: Insufficient documentation

## 2022-03-26 DIAGNOSIS — G4489 Other headache syndrome: Secondary | ICD-10-CM | POA: Diagnosis not present

## 2022-03-26 DIAGNOSIS — I472 Ventricular tachycardia, unspecified: Secondary | ICD-10-CM | POA: Diagnosis not present

## 2022-03-26 DIAGNOSIS — Z952 Presence of prosthetic heart valve: Secondary | ICD-10-CM | POA: Insufficient documentation

## 2022-03-26 DIAGNOSIS — G4730 Sleep apnea, unspecified: Secondary | ICD-10-CM | POA: Diagnosis not present

## 2022-03-26 DIAGNOSIS — I491 Atrial premature depolarization: Secondary | ICD-10-CM | POA: Insufficient documentation

## 2022-03-26 DIAGNOSIS — I509 Heart failure, unspecified: Secondary | ICD-10-CM | POA: Insufficient documentation

## 2022-03-26 DIAGNOSIS — K0889 Other specified disorders of teeth and supporting structures: Secondary | ICD-10-CM | POA: Diagnosis not present

## 2022-03-26 DIAGNOSIS — E119 Type 2 diabetes mellitus without complications: Secondary | ICD-10-CM | POA: Diagnosis not present

## 2022-03-26 DIAGNOSIS — I351 Nonrheumatic aortic (valve) insufficiency: Secondary | ICD-10-CM | POA: Diagnosis not present

## 2022-03-26 HISTORY — DX: Unspecified atrial flutter: I48.92

## 2022-03-26 HISTORY — DX: Congenital aneurysm of aorta: Q25.43

## 2022-03-26 HISTORY — DX: Sleep apnea, unspecified: G47.30

## 2022-03-26 HISTORY — DX: Aneurysm of the ascending aorta, without rupture: I71.21

## 2022-03-26 LAB — HEMOGLOBIN A1C
Hgb A1c MFr Bld: 7.1 % — ABNORMAL HIGH (ref 4.8–5.6)
Mean Plasma Glucose: 157.07 mg/dL

## 2022-03-26 LAB — URINALYSIS, ROUTINE W REFLEX MICROSCOPIC
Bacteria, UA: NONE SEEN
Bilirubin Urine: NEGATIVE
Glucose, UA: >=500 mg/dL — AB
Hgb urine dipstick: NEGATIVE
Ketones, ur: NEGATIVE mg/dL
Leukocytes,Ua: NEGATIVE
Nitrite: NEGATIVE
Protein, ur: NEGATIVE mg/dL
Specific Gravity, Urine: 1.032 — ABNORMAL HIGH (ref 1.005–1.030)
pH: 5 (ref 5.0–8.0)

## 2022-03-26 LAB — TYPE AND SCREEN
ABO/RH(D): A POS
Antibody Screen: NEGATIVE

## 2022-03-26 LAB — PROTIME-INR
INR: 1.3 — ABNORMAL HIGH (ref 0.8–1.2)
Prothrombin Time: 16.3 seconds — ABNORMAL HIGH (ref 11.4–15.2)

## 2022-03-26 LAB — CBC
HCT: 46.3 % (ref 39.0–52.0)
Hemoglobin: 15.6 g/dL (ref 13.0–17.0)
MCH: 30.2 pg (ref 26.0–34.0)
MCHC: 33.7 g/dL (ref 30.0–36.0)
MCV: 89.7 fL (ref 80.0–100.0)
Platelets: 226 10*3/uL (ref 150–400)
RBC: 5.16 MIL/uL (ref 4.22–5.81)
RDW: 13.1 % (ref 11.5–15.5)
WBC: 8.3 10*3/uL (ref 4.0–10.5)
nRBC: 0 % (ref 0.0–0.2)

## 2022-03-26 LAB — COMPREHENSIVE METABOLIC PANEL
ALT: 27 U/L (ref 0–44)
AST: 21 U/L (ref 15–41)
Albumin: 4.1 g/dL (ref 3.5–5.0)
Alkaline Phosphatase: 60 U/L (ref 38–126)
Anion gap: 7 (ref 5–15)
BUN: 15 mg/dL (ref 8–23)
CO2: 26 mmol/L (ref 22–32)
Calcium: 9.3 mg/dL (ref 8.9–10.3)
Chloride: 105 mmol/L (ref 98–111)
Creatinine, Ser: 0.88 mg/dL (ref 0.61–1.24)
GFR, Estimated: >60 mL/min (ref >60)
Glucose, Bld: 183 mg/dL — ABNORMAL HIGH (ref 70–99)
Potassium: 3.8 mmol/L (ref 3.5–5.1)
Sodium: 138 mmol/L (ref 135–145)
Total Bilirubin: 1.6 mg/dL — ABNORMAL HIGH (ref 0.3–1.2)
Total Protein: 6.9 g/dL (ref 6.5–8.1)

## 2022-03-26 LAB — GLUCOSE, CAPILLARY: Glucose-Capillary: 149 mg/dL — ABNORMAL HIGH (ref 70–99)

## 2022-03-26 LAB — SURGICAL PCR SCREEN
MRSA, PCR: NEGATIVE
Staphylococcus aureus: NEGATIVE

## 2022-03-26 LAB — APTT: aPTT: 33 seconds (ref 24–36)

## 2022-03-26 NOTE — Progress Notes (Signed)
PCP - Annye Asa Cardiologist - Loralie Champagne  PPM/ICD - denies Chest x-ray - DOS- needs to be within 72 hours of surgery EKG - 03/26/2022  Stress Test - 2008 ECHO - 07/11/21 Cardiac Cath -   Sleep Study - 09/05/20- does not wear CPAP   Fasting Blood Sugar - CBG 149 at PAT- HGB A1c checked at PAT- pt does not check blood sugars at home Patient does not have glucometer at home  Blood Thinner Instructions: last dose of Eliquis 03/26/22 per patient   ERAS Protcol - NPO order COVID TEST- 03/26/2022    Anesthesia review: yes - heart hx  Patient denies shortness of breath, fever, cough and chest pain at PAT appointment   All instructions explained to the patient, with a verbal understanding of the material. Patient agrees to go over the instructions while at home for a better understanding. Patient also instructed to self quarantine after being tested for COVID-19. The opportunity to ask questions was provided.

## 2022-03-27 ENCOUNTER — Encounter (HOSPITAL_COMMUNITY): Payer: Self-pay

## 2022-03-27 LAB — SARS CORONAVIRUS 2 (TAT 6-24 HRS): SARS Coronavirus 2: NEGATIVE

## 2022-03-27 MED ORDER — VANCOMYCIN HCL 1500 MG/300ML IV SOLN
1500.0000 mg | INTRAVENOUS | Status: AC
  Administered 2022-03-30: 1500 mg via INTRAVENOUS
  Filled 2022-03-27 (×2): qty 300

## 2022-03-27 NOTE — Progress Notes (Signed)
Anesthesia Chart Review:  Case: [id number] Date/Time: 03/30/22 0715   Procedures:      XI ROBOTIC ASSISTED Esophagomyotomy, diverticulectomy (Right: Chest)     ESOPHAGOGASTRODUODENOSCOPY (EGD)   Anesthesia type: General   Pre-op diagnosis: ESOPHAGEAL DIVERTICULUM   Location: [facility] OR ROOM 10 / [place] [facility]   Surgeons: Lajuana Matte, MD       DISCUSSION: Patient is a 71 year old male scheduled for the above procedure.  History includes never smoker, HLD, CHF, aflutter (s/p DCCV 03/14/20), aortic insufficiency (s/p Ross Procedure: AVR/Pulmonary autograft in aortic position & PVR with bioprosthetic PV 1994 in [place]), dilated aortic root (5.8 cm 03/11/22 CT), DM2, OSA (severe 09/2020, does not use CPAP), GERD (with esophageal diverticulum), hiatal hernia (s/p repair 07/2001), inguinal hernia (s/p bilateral IHR 08/02/01), left brachial plexus neuropathy, left occipital cavernoma (wiht small focus of chronic hemorrhage 09/17/18 MRI), elevated left hemidiaphragm.    He reported last Eliquis 03/26/22. He is for CXR on arrival for surgery.  Anesthesia team to evaluate on the day of surgery.   VS: BP (!) 149/86   Pulse (!) 58   Temp 36.5 C (Oral)   Resp 17   Ht '6\' 3"'$  (1.905 m)   Wt 100 kg   SpO2 100%   BMI 27.56 kg/m    PROVIDERS: [name], MD is PCP  Loralie Champagne, MD is HF cardiologist. Last visit 12/30/21 with Allena Katz, [employer].  Thompson Grayer, MD is EP cardiologist. Evaluation on 03/25/20. If aflutter recurs could be considered for ablation. Ablation favored over AAD given his conduction system disease.  Modesto Charon, MD is CT surgery (following aortic root aneurysm). Last visit 03/25/21 with one year follow-up imaging recommended. Previously decision to hold off on repair unless he develops significant AI. Mild AI 07/2021 echo.    LABS: Labs reviewed: Acceptable for surgery. (all labs ordered are listed, but only abnormal results are displayed)  Labs Reviewed   GLUCOSE, CAPILLARY - Abnormal; Notable for the following components:      Result Value   Glucose-Capillary 149 (*)    All other components within normal limits  HEMOGLOBIN A1C - Abnormal; Notable for the following components:   Hgb A1c MFr Bld 7.1 (*)    All other components within normal limits  COMPREHENSIVE METABOLIC PANEL - Abnormal; Notable for the following components:   Glucose, Bld 183 (*)    Total Bilirubin 1.6 (*)    All other components within normal limits  PROTIME-INR - Abnormal; Notable for the following components:   Prothrombin Time 16.3 (*)    INR 1.3 (*)    All other components within normal limits  URINALYSIS, ROUTINE W REFLEX MICROSCOPIC - Abnormal; Notable for the following components:   Specific Gravity, Urine 1.032 (*)    Glucose, UA >=500 (*)    All other components within normal limits  SURGICAL PCR SCREEN  SARS CORONAVIRUS 2 (TAT 6-24 HRS)  CBC  APTT  TYPE AND SCREEN    Sleep Study 09/12/20: FINDINGS: 1. Severe Obstructive Sleep Apnea with AHI 35.2/hr.  2. No significant Central Sleep Apnea with pAHIc 2/hr. 3. Oxygen desaturations as low as 85%. 4. Mild to moderate snoring was present. O2 sats were < 88% for 4 min. 5. Total sleep time was 8 hrs and 35 min. 6. 25% of total sleep time was spent in REM sleep.  7. Normal sleep onset latency at 20 min.  8. Shortented REM sleep onset latency at 44 min.  9. Total awakenings were  10.    IMAGES: CXR for day of surgery per surgeon order.  CT Chest 03/11/22: IMPRESSION: - Status post surgical repair of ascending thoracic aorta. - Aortic root is aneurysmal at 5.8 cm. - Coronary artery calcifications are noted suggesting coronary artery disease. Calcifications of pulmonary artery are noted as well. - Calcified right paratracheal subcarinal adenopathy is noted consistent with prior granulomatous disease. - Elevated left hemidiaphragm with mild left basilar subsegmental atelectasis. - Small gallstone. -  Aortic Atherosclerosis (ICD10-I70.0).  MRA Chest 02/09/22: IMPRESSION: Limited MRA evaluation on today's study due to respiratory motion artifact. Maximal caliber of aortic root dilatation is felt to be stable at approximately 5.4 cm. Consider future follow-up with CTA of the chest given that there may be increasing difficulty tolerating MRI studies.   Esophogram/Barium Swallow Study 01/20/22: IMPRESSION: 1. Mild dysmotility. 2. Moderate mid esophageal diverticulum arising from the RIGHT lateral soft Gus. 3. No signs of narrowing or gross lesion on single contrast evaluation. 4. No signs of gastroesophageal reflux or hiatal hernia.    EKG: 03/26/22:  Sinus rhythm with 1st degree A-V block with Premature atrial complexes Right bundle branch block Abnormal ECG No significant change was found Confirmed by [place], Will [phone]) on 03/26/2022 8:37:03 PM   CV: Long term monitor 07/11/21-07/24/21: Patch Wear Time:  13 days and 10 hours (2022-12-09T10:40:00-0500 to 2022-12-22T21:18:20-0500)   Patient had a min HR of 45 bpm, max HR of 169 bpm, and avg HR of 64 bpm. Predominant underlying rhythm was Sinus Rhythm. First Degree AV Block was present. 9 Ventricular Tachycardia runs occurred, the run with the fastest interval lasting 4 beats with a  max rate of 169 bpm, the longest lasting 7 beats with an avg rate of 101 bpm. 79 Supraventricular Tachycardia runs occurred, the run with the fastest interval lasting 7 beats with a max rate of 145 bpm, the longest lasting 22.2 secs with an avg rate of  123 bpm. Isolated SVEs were frequent (9.2%, 017510), SVE Couplets were occasional (1.3%, 7929), and SVE Triplets were rare (<1.0%, 1133). Isolated VEs were rare (<1.0%, 2093), VE Couplets were rare (<1.0%, 55), and VE Triplets were rare (<1.0%, 6).  Ventricular Trigeminy was present.   Impression:  1. Primarily NSR 2. No atrial fibrillation noted.  3. Short NSVT runs, 9 4. Short SVT runs, 79 5.  Frequent PACs, 9.2% of total beats   Echo 07/11/21: IMPRESSIONS   1. Left ventricular ejection fraction, by estimation, is 50%. The left  ventricle has mildly decreased function. The left ventricle demonstrates  global hypokinesis. The left ventricular internal cavity size was mildly  dilated. Left ventricular diastolic  parameters are consistent with Grade II diastolic dysfunction  (pseudonormalization).   2. Right ventricular systolic function is mildly reduced. The right  ventricular size is normal. There is normal pulmonary artery systolic  pressure. The estimated right ventricular systolic pressure is 25.8 mmHg.   3. Left atrial size was mildly dilated.   4. Right atrial size was mildly dilated.   5. The mitral valve is normal in structure. Trivial mitral valve  regurgitation. No evidence of mitral stenosis.   6. Pulmonary autograft in aortic position (Ross Procedure). Mild  peri-valvular aortic insufficiency. No significant stenosis. The aortic  valve has been repaired/replaced. Aortic valve regurgitation is mild.   7. Bioprosthetic pulmonary valve (Ross procedure). No regurgitation. No  significant gradient was measured across the valve.   8. Aortic dilatation noted. There is moderate dilatation of the aortic  root, measuring 46  mm.   9. The inferior vena cava is normal in size with greater than 50%  respiratory variability, suggesting right atrial pressure of 3 mmHg.    cMRI 12/22/13: IMPRESSION:  1. Status post Ross procedure with pulmonary valve and artery  autograft in aortic position with re-implanted coronary buttons and  pulmonary valve and artery homograft in the pulmonary position.  There was mild autograft valve regurgitation and no significant  stenosis. The homograft pulmonary valve was thickened with at least  mild regurgitation and suspect a degree of stenosis. Valve gradient  reported on recent echo.  2. There was dilation of the pulmonary autograft in the  aortic  position at the sinuses of valsalva, reaching 4.8 cm as measured  into the left coronary cusp. There was also narrowing with some  turbulent flow at the suture line where autograft was sewed into the  native ascending aorta.  3.  Normal LV size with mild LV hypertrophy, EF 66%.  4.  Mildly dilated RV with normal systolic function.  5.  No myocardial delayed enhancement.     Last known cath was prior to [facility] Procedure.   Past Medical History:  Diagnosis Date   Acute gastritis    Aortic insufficiency    s/p Ross Procedure: AVR/Pulmonary autograft in aortic position & PVR with bioprosthetic PV 1994 in [place]   Aortic root aneurysm (HCC)    Atrial flutter (HCC)    history- no atrial flutter since cardioversion   CHF (congestive heart failure) (HCC)    Diabetes mellitus without complication ([place])    type 2   Early satiety    Esophageal stricture    GERD (gastroesophageal reflux disease)    Headache syndrome 10/18/2018   Hiatal hernia    Hyperlipidemia    Neuropathy    left brachial plexus   Pain, dental    Sleep apnea    09/12/20 AHI 35.2/hr. Does not use CPAP. (03/26/22)    Past Surgical History:  Procedure Laterality Date   AORTIC VALVE REPLACEMENT  1994   CARDIOVERSION N/A 03/14/2020   Procedure: CARDIOVERSION;  Surgeon: [name], MD;  Location: [facility] ENDOSCOPY;  Service: Cardiovascular;  Laterality: N/A;   GANGLION CYST EXCISION  JAN 2002   HIATAL HERNIA REPAIR  DEC 2002   TEE WITHOUT CARDIOVERSION N/A 03/14/2020   Procedure: TRANSESOPHAGEAL ECHOCARDIOGRAM (TEE);  Surgeon: [name], MD;  Location: [facility] ENDOSCOPY;  Service: Cardiovascular;  Laterality: N/A;   TONSILLECTOMY     VASECTOMY  DEC 2002    MEDICATIONS:  apixaban (ELIQUIS) 5 MG TABS tablet   azelastine (ASTELIN) 0.1 % nasal spray   cetirizine (ZYRTEC) 10 MG tablet   empagliflozin (JARDIANCE) 10 MG TABS tablet   famotidine (PEPCID) 40 MG tablet   finasteride (PROSCAR) 5 MG tablet    fluticasone (FLONASE) 50 MCG/ACT nasal spray   furosemide (LASIX) 20 MG tablet   loperamide (IMODIUM) 2 MG capsule   losartan (COZAAR) 25 MG tablet   metoprolol succinate (TOPROL-XL) 100 MG 24 hr tablet   montelukast (SINGULAIR) 10 MG tablet   pantoprazole (PROTONIX) 40 MG tablet   rosuvastatin (CRESTOR) 40 MG tablet   spironolactone (ALDACTONE) 25 MG tablet   No current facility-administered medications for this encounter.    Myra Gianotti, PA-C Surgical Short Stay/Anesthesiology [facility] Phone [phone] [facility] Phone [phone] 03/27/2022 10:58 AM

## 2022-03-27 NOTE — Anesthesia Preprocedure Evaluation (Addendum)
Anesthesia Evaluation  Patient identified by MRN, date of birth, ID band Patient awake    Reviewed: Allergy & Precautions, NPO status , Patient's Chart, lab work & pertinent test results  Airway Mallampati: I  TM Distance: <3 FB Neck ROM: Full    Dental  (+) Teeth Intact, Dental Advisory Given   Pulmonary sleep apnea ,    breath sounds clear to auscultation       Cardiovascular hypertension, Pt. on medications and Pt. on home beta blockers +CHF  + dysrhythmias Atrial Fibrillation  Rhythm:Regular Rate:Normal  - s/p AVR   Neuro/Psych  Headaches,    GI/Hepatic Neg liver ROS, hiatal hernia, GERD  Medicated,  Endo/Other  diabetes, Type 2  Renal/GU negative Renal ROS     Musculoskeletal  (+) Arthritis ,   Abdominal Normal abdominal exam  (+)   Peds  Hematology negative hematology ROS (+)   Anesthesia Other Findings   Reproductive/Obstetrics                           Anesthesia Physical Anesthesia Plan  ASA: 3  Anesthesia Plan: General   Post-op Pain Management:    Induction: Intravenous  PONV Risk Score and Plan: 3 and Ondansetron, Dexamethasone and Midazolam  Airway Management Planned: Double Lumen EBT  Additional Equipment: ClearSight  Intra-op Plan:   Post-operative Plan: Possible Post-op intubation/ventilation  Informed Consent: I have reviewed the patients History and Physical, chart, labs and discussed the procedure including the risks, benefits and alternatives for the proposed anesthesia with the patient or authorized representative who has indicated his/her understanding and acceptance.     Dental advisory given  Plan Discussed with: CRNA  Anesthesia Plan Comments: (PAT note written 03/27/2022 by Myra Gianotti, PA-C. )      Anesthesia Quick Evaluation

## 2022-03-30 ENCOUNTER — Encounter (HOSPITAL_COMMUNITY): Payer: Self-pay | Admitting: Thoracic Surgery (Cardiothoracic Vascular Surgery)

## 2022-03-30 ENCOUNTER — Inpatient Hospital Stay (HOSPITAL_COMMUNITY): Payer: HMO

## 2022-03-30 ENCOUNTER — Inpatient Hospital Stay (HOSPITAL_COMMUNITY): Payer: HMO | Admitting: Vascular Surgery

## 2022-03-30 ENCOUNTER — Other Ambulatory Visit: Payer: Self-pay

## 2022-03-30 ENCOUNTER — Encounter (HOSPITAL_COMMUNITY)
Admission: RE | Disposition: A | Discharge status: Home / Self Care | Payer: Self-pay | Source: Ambulatory Visit | Attending: Thoracic Surgery (Cardiothoracic Vascular Surgery)

## 2022-03-30 ENCOUNTER — Inpatient Hospital Stay (HOSPITAL_COMMUNITY)
Admission: RE | Admit: 2022-03-30 | Discharge: 2022-03-31 | DRG: 328 | Disposition: A | Discharge status: Home / Self Care | Payer: HMO | Source: Ambulatory Visit | Attending: Thoracic Surgery (Cardiothoracic Vascular Surgery) | Admitting: Thoracic Surgery (Cardiothoracic Vascular Surgery)

## 2022-03-30 DIAGNOSIS — Z7901 Long term (current) use of anticoagulants: Secondary | ICD-10-CM

## 2022-03-30 DIAGNOSIS — E119 Type 2 diabetes mellitus without complications: Secondary | ICD-10-CM | POA: Diagnosis not present

## 2022-03-30 DIAGNOSIS — E1140 Type 2 diabetes mellitus with diabetic neuropathy, unspecified: Secondary | ICD-10-CM | POA: Diagnosis not present

## 2022-03-30 DIAGNOSIS — R079 Chest pain, unspecified: Secondary | ICD-10-CM | POA: Diagnosis not present

## 2022-03-30 DIAGNOSIS — Z806 Family history of leukemia: Secondary | ICD-10-CM

## 2022-03-30 DIAGNOSIS — Z880 Allergy status to penicillin: Secondary | ICD-10-CM | POA: Diagnosis not present

## 2022-03-30 DIAGNOSIS — J90 Pleural effusion, not elsewhere classified: Secondary | ICD-10-CM | POA: Diagnosis not present

## 2022-03-30 DIAGNOSIS — J939 Pneumothorax, unspecified: Secondary | ICD-10-CM | POA: Diagnosis not present

## 2022-03-30 DIAGNOSIS — Z01818 Encounter for other preprocedural examination: Secondary | ICD-10-CM | POA: Diagnosis not present

## 2022-03-30 DIAGNOSIS — I509 Heart failure, unspecified: Secondary | ICD-10-CM

## 2022-03-30 DIAGNOSIS — M1990 Unspecified osteoarthritis, unspecified site: Secondary | ICD-10-CM | POA: Diagnosis not present

## 2022-03-30 DIAGNOSIS — Z888 Allergy status to other drugs, medicaments and biological substances status: Secondary | ICD-10-CM | POA: Diagnosis not present

## 2022-03-30 DIAGNOSIS — I110 Hypertensive heart disease with heart failure: Secondary | ICD-10-CM

## 2022-03-30 DIAGNOSIS — Z7984 Long term (current) use of oral hypoglycemic drugs: Secondary | ICD-10-CM

## 2022-03-30 DIAGNOSIS — J9811 Atelectasis: Secondary | ICD-10-CM | POA: Diagnosis not present

## 2022-03-30 DIAGNOSIS — E785 Hyperlipidemia, unspecified: Secondary | ICD-10-CM | POA: Diagnosis not present

## 2022-03-30 DIAGNOSIS — I10 Essential (primary) hypertension: Secondary | ICD-10-CM | POA: Diagnosis not present

## 2022-03-30 DIAGNOSIS — K225 Diverticulum of esophagus, acquired: Principal | ICD-10-CM | POA: Diagnosis present

## 2022-03-30 DIAGNOSIS — Q396 Congenital diverticulum of esophagus: Secondary | ICD-10-CM

## 2022-03-30 DIAGNOSIS — Z952 Presence of prosthetic heart valve: Secondary | ICD-10-CM

## 2022-03-30 DIAGNOSIS — J439 Emphysema, unspecified: Secondary | ICD-10-CM | POA: Diagnosis not present

## 2022-03-30 DIAGNOSIS — K219 Gastro-esophageal reflux disease without esophagitis: Secondary | ICD-10-CM | POA: Diagnosis present

## 2022-03-30 DIAGNOSIS — Z801 Family history of malignant neoplasm of trachea, bronchus and lung: Secondary | ICD-10-CM | POA: Diagnosis not present

## 2022-03-30 DIAGNOSIS — Z79899 Other long term (current) drug therapy: Secondary | ICD-10-CM

## 2022-03-30 HISTORY — PX: ESOPHAGOGASTRODUODENOSCOPY: SHX5428

## 2022-03-30 HISTORY — PX: INTERCOSTAL NERVE BLOCK: SHX5021

## 2022-03-30 LAB — GLUCOSE, CAPILLARY
Glucose-Capillary: 143 mg/dL — ABNORMAL HIGH (ref 70–99)
Glucose-Capillary: 156 mg/dL — ABNORMAL HIGH (ref 70–99)
Glucose-Capillary: 156 mg/dL — ABNORMAL HIGH (ref 70–99)
Glucose-Capillary: 158 mg/dL — ABNORMAL HIGH (ref 70–99)
Glucose-Capillary: 184 mg/dL — ABNORMAL HIGH (ref 70–99)

## 2022-03-30 LAB — ABO/RH: ABO/RH(D): A POS

## 2022-03-30 SURGERY — ESOPHAGOMYOTOMY, ROBOT-ASSISTED, HELLER
Anesthesia: General | Site: Chest | Laterality: Right

## 2022-03-30 MED ORDER — ENOXAPARIN SODIUM 40 MG/0.4ML IJ SOSY
40.0000 mg | PREFILLED_SYRINGE | Freq: Every day | INTRAMUSCULAR | Status: DC
  Administered 2022-03-30: 40 mg via SUBCUTANEOUS
  Filled 2022-03-30: qty 0.4

## 2022-03-30 MED ORDER — LIDOCAINE 2% (20 MG/ML) 5 ML SYRINGE
INTRAMUSCULAR | Status: AC
  Filled 2022-03-30: qty 5

## 2022-03-30 MED ORDER — ROCURONIUM BROMIDE 10 MG/ML (PF) SYRINGE
PREFILLED_SYRINGE | INTRAVENOUS | Status: DC | PRN
  Administered 2022-03-30: 30 mg via INTRAVENOUS
  Administered 2022-03-30: 70 mg via INTRAVENOUS
  Administered 2022-03-30: 30 mg via INTRAVENOUS
  Administered 2022-03-30: 20 mg via INTRAVENOUS

## 2022-03-30 MED ORDER — MIDAZOLAM HCL 2 MG/2ML IJ SOLN
INTRAMUSCULAR | Status: AC
  Filled 2022-03-30: qty 2

## 2022-03-30 MED ORDER — EPHEDRINE SULFATE-NACL 50-0.9 MG/10ML-% IV SOSY
PREFILLED_SYRINGE | INTRAVENOUS | Status: DC | PRN
  Administered 2022-03-30 (×4): 5 mg via INTRAVENOUS

## 2022-03-30 MED ORDER — INSULIN ASPART 100 UNIT/ML IJ SOLN
0.0000 [IU] | INTRAMUSCULAR | Status: DC | PRN
  Administered 2022-03-30: 2 [IU] via SUBCUTANEOUS
  Filled 2022-03-30: qty 1

## 2022-03-30 MED ORDER — PANTOPRAZOLE SODIUM 40 MG IV SOLR
40.0000 mg | Freq: Two times a day (BID) | INTRAVENOUS | Status: DC
  Administered 2022-03-30 – 2022-03-31 (×2): 40 mg via INTRAVENOUS
  Filled 2022-03-30 (×2): qty 10

## 2022-03-30 MED ORDER — 0.9 % SODIUM CHLORIDE (POUR BTL) OPTIME
TOPICAL | Status: DC | PRN
  Administered 2022-03-30: 2000 mL

## 2022-03-30 MED ORDER — AZELASTINE HCL 0.1 % NA SOLN
2.0000 | Freq: Two times a day (BID) | NASAL | Status: DC
Start: 2022-03-30 — End: 2022-03-31
  Administered 2022-03-30 – 2022-03-31 (×2): 2 via NASAL
  Filled 2022-03-30: qty 30

## 2022-03-30 MED ORDER — ROCURONIUM BROMIDE 10 MG/ML (PF) SYRINGE
PREFILLED_SYRINGE | INTRAVENOUS | Status: AC
  Filled 2022-03-30: qty 10

## 2022-03-30 MED ORDER — SENNOSIDES-DOCUSATE SODIUM 8.6-50 MG PO TABS
1.0000 | ORAL_TABLET | Freq: Every day | ORAL | Status: DC
Start: 2022-03-30 — End: 2022-03-31
  Filled 2022-03-30: qty 1

## 2022-03-30 MED ORDER — BISACODYL 5 MG PO TBEC
10.0000 mg | DELAYED_RELEASE_TABLET | Freq: Every day | ORAL | Status: DC
  Administered 2022-03-31: 10 mg via ORAL
  Filled 2022-03-30: qty 2

## 2022-03-30 MED ORDER — KETOROLAC TROMETHAMINE 15 MG/ML IJ SOLN
INTRAMUSCULAR | Status: AC
  Administered 2022-03-30: 15 mg via INTRAVENOUS
  Filled 2022-03-30: qty 1

## 2022-03-30 MED ORDER — VANCOMYCIN HCL IN DEXTROSE 1-5 GM/200ML-% IV SOLN
1000.0000 mg | Freq: Two times a day (BID) | INTRAVENOUS | Status: AC
  Administered 2022-03-30: 1000 mg via INTRAVENOUS
  Filled 2022-03-30: qty 200

## 2022-03-30 MED ORDER — BUPIVACAINE HCL (PF) 0.5 % IJ SOLN
INTRAMUSCULAR | Status: AC
Start: 2022-03-30 — End: ?
  Filled 2022-03-30: qty 30

## 2022-03-30 MED ORDER — MORPHINE SULFATE (PF) 2 MG/ML IV SOLN: 2.0000 mg | INTRAVENOUS | Status: DC | PRN

## 2022-03-30 MED ORDER — PHENYLEPHRINE 80 MCG/ML (10ML) SYRINGE FOR IV PUSH (FOR BLOOD PRESSURE SUPPORT)
PREFILLED_SYRINGE | INTRAVENOUS | Status: DC | PRN
  Administered 2022-03-30 (×2): 80 ug via INTRAVENOUS

## 2022-03-30 MED ORDER — ACETAMINOPHEN 325 MG PO TABS: 325.0000 mg | ORAL_TABLET | ORAL | Status: DC | PRN

## 2022-03-30 MED ORDER — OXYCODONE HCL 5 MG PO TABS: 5.0000 mg | ORAL_TABLET | Freq: Once | ORAL | Status: DC | PRN

## 2022-03-30 MED ORDER — ACETAMINOPHEN 160 MG/5ML PO SOLN: 325.0000 mg | ORAL | Status: DC | PRN

## 2022-03-30 MED ORDER — DEXAMETHASONE SODIUM PHOSPHATE 10 MG/ML IJ SOLN
INTRAMUSCULAR | Status: AC
  Filled 2022-03-30: qty 1

## 2022-03-30 MED ORDER — ACETAMINOPHEN 10 MG/ML IV SOLN
INTRAVENOUS | Status: AC
  Administered 2022-03-30: 1000 mg via INTRAVENOUS
  Filled 2022-03-30: qty 100

## 2022-03-30 MED ORDER — ACETAMINOPHEN 10 MG/ML IV SOLN: 1000.0000 mg | Freq: Once | INTRAVENOUS | Status: DC | PRN

## 2022-03-30 MED ORDER — PROPOFOL 10 MG/ML IV BOLUS
INTRAVENOUS | Status: DC | PRN
  Administered 2022-03-30: 150 mg via INTRAVENOUS

## 2022-03-30 MED ORDER — FENTANYL CITRATE (PF) 100 MCG/2ML IJ SOLN: 25.0000 ug | INTRAMUSCULAR | Status: DC | PRN

## 2022-03-30 MED ORDER — BUPIVACAINE LIPOSOME 1.3 % IJ SUSP
INTRAMUSCULAR | Status: AC
  Filled 2022-03-30: qty 20

## 2022-03-30 MED ORDER — EPHEDRINE 5 MG/ML INJ
INTRAVENOUS | Status: AC
  Filled 2022-03-30: qty 5

## 2022-03-30 MED ORDER — SUGAMMADEX SODIUM 200 MG/2ML IV SOLN
INTRAVENOUS | Status: DC | PRN
  Administered 2022-03-30: 300 mg via INTRAVENOUS

## 2022-03-30 MED ORDER — LACTATED RINGERS IV SOLN: INTRAVENOUS | Status: DC

## 2022-03-30 MED ORDER — ONDANSETRON HCL 4 MG/2ML IJ SOLN
INTRAMUSCULAR | Status: AC
  Filled 2022-03-30: qty 2

## 2022-03-30 MED ORDER — OXYCODONE HCL 5 MG/5ML PO SOLN: 5.0000 mg | Freq: Once | ORAL | Status: DC | PRN

## 2022-03-30 MED ORDER — INSULIN ASPART 100 UNIT/ML IJ SOLN
0.0000 [IU] | Freq: Four times a day (QID) | INTRAMUSCULAR | Status: DC
  Administered 2022-03-31: 2 [IU] via SUBCUTANEOUS

## 2022-03-30 MED ORDER — ACETAMINOPHEN 10 MG/ML IV SOLN
1000.0000 mg | Freq: Four times a day (QID) | INTRAVENOUS | Status: AC
Start: 2022-03-30 — End: 2022-03-31
  Administered 2022-03-30 – 2022-03-31 (×3): 1000 mg via INTRAVENOUS
  Filled 2022-03-30 (×4): qty 100

## 2022-03-30 MED ORDER — ONDANSETRON HCL 4 MG/2ML IJ SOLN: 4.0000 mg | Freq: Four times a day (QID) | INTRAMUSCULAR | Status: DC | PRN

## 2022-03-30 MED ORDER — LIDOCAINE 2% (20 MG/ML) 5 ML SYRINGE
INTRAMUSCULAR | Status: AC
Start: 2022-03-30 — End: ?
  Filled 2022-03-30: qty 5

## 2022-03-30 MED ORDER — DEXAMETHASONE SODIUM PHOSPHATE 10 MG/ML IJ SOLN
INTRAMUSCULAR | Status: AC
Start: 2022-03-30 — End: ?
  Filled 2022-03-30: qty 1

## 2022-03-30 MED ORDER — MIDAZOLAM HCL 2 MG/2ML IJ SOLN
INTRAMUSCULAR | Status: DC | PRN
  Administered 2022-03-30 (×2): 1 mg via INTRAVENOUS

## 2022-03-30 MED ORDER — ORAL CARE MOUTH RINSE: 15.0000 mL | Freq: Once | OROMUCOSAL | Status: AC

## 2022-03-30 MED ORDER — SODIUM CHLORIDE FLUSH 0.9 % IV SOLN
INTRAVENOUS | Status: DC | PRN
  Administered 2022-03-30: 100 mL

## 2022-03-30 MED ORDER — KETOROLAC TROMETHAMINE 15 MG/ML IJ SOLN
15.0000 mg | Freq: Four times a day (QID) | INTRAMUSCULAR | Status: DC
  Administered 2022-03-31 (×3): 15 mg via INTRAVENOUS
  Filled 2022-03-30 (×3): qty 1

## 2022-03-30 MED ORDER — LIDOCAINE 2% (20 MG/ML) 5 ML SYRINGE
INTRAMUSCULAR | Status: DC | PRN
  Administered 2022-03-30: 40 mg via INTRAVENOUS

## 2022-03-30 MED ORDER — AMISULPRIDE (ANTIEMETIC) 5 MG/2ML IV SOLN: 10.0000 mg | Freq: Once | INTRAVENOUS | Status: DC | PRN

## 2022-03-30 MED ORDER — FENTANYL CITRATE (PF) 250 MCG/5ML IJ SOLN
INTRAMUSCULAR | Status: DC | PRN
Start: 2022-03-30 — End: 2022-03-30
  Administered 2022-03-30 (×5): 50 ug via INTRAVENOUS

## 2022-03-30 MED ORDER — PROPOFOL 10 MG/ML IV BOLUS
INTRAVENOUS | Status: AC
  Filled 2022-03-30: qty 20

## 2022-03-30 MED ORDER — ONDANSETRON HCL 4 MG/2ML IJ SOLN
INTRAMUSCULAR | Status: DC | PRN
  Administered 2022-03-30: 4 mg via INTRAVENOUS

## 2022-03-30 MED ORDER — ROCURONIUM BROMIDE 10 MG/ML (PF) SYRINGE
PREFILLED_SYRINGE | INTRAVENOUS | Status: AC
  Filled 2022-03-30: qty 20

## 2022-03-30 MED ORDER — FENTANYL CITRATE (PF) 250 MCG/5ML IJ SOLN
INTRAMUSCULAR | Status: AC
  Filled 2022-03-30: qty 5

## 2022-03-30 MED ORDER — PHENYLEPHRINE 80 MCG/ML (10ML) SYRINGE FOR IV PUSH (FOR BLOOD PRESSURE SUPPORT)
PREFILLED_SYRINGE | INTRAVENOUS | Status: AC
  Filled 2022-03-30: qty 10

## 2022-03-30 MED ORDER — FLUTICASONE PROPIONATE 50 MCG/ACT NA SUSP: 2.0000 | Freq: Two times a day (BID) | NASAL | Status: DC | PRN

## 2022-03-30 MED ORDER — BUPIVACAINE LIPOSOME 1.3 % IJ SUSP: INTRAMUSCULAR | Status: DC | PRN

## 2022-03-30 MED ORDER — CHLORHEXIDINE GLUCONATE 0.12 % MT SOLN
15.0000 mL | Freq: Once | OROMUCOSAL | Status: AC
  Administered 2022-03-30: 15 mL via OROMUCOSAL
  Filled 2022-03-30: qty 15

## 2022-03-30 MED ORDER — DEXAMETHASONE SODIUM PHOSPHATE 10 MG/ML IJ SOLN
INTRAMUSCULAR | Status: DC | PRN
  Administered 2022-03-30: 5 mg via INTRAVENOUS

## 2022-03-30 MED ORDER — PROMETHAZINE HCL 25 MG/ML IJ SOLN: 6.2500 mg | INTRAMUSCULAR | Status: DC | PRN

## 2022-03-30 SURGICAL SUPPLY — 77 items
ADH SKN CLS APL DERMABOND .7 (GAUZE/BANDAGES/DRESSINGS) ×2
BLADE SURG 11 STRL SS (BLADE) ×3 IMPLANT
BUTTON OLYMPUS DEFENDO 5 PIECE (MISCELLANEOUS) ×3 IMPLANT
CANISTER SUCT 3000ML PPV (MISCELLANEOUS) ×6 IMPLANT
CNTNR URN SCR LID CUP LEK RST (MISCELLANEOUS) ×3 IMPLANT
CONT SPEC 4OZ STRL OR WHT (MISCELLANEOUS) ×4
DEFOGGER SCOPE WARMER CLEARIFY (MISCELLANEOUS) ×3 IMPLANT
DERMABOND ADVANCED (GAUZE/BANDAGES/DRESSINGS) ×2
DERMABOND ADVANCED .7 DNX12 (GAUZE/BANDAGES/DRESSINGS) ×3 IMPLANT
DRAIN CHANNEL 19F RND (DRAIN) IMPLANT
DRAIN CONNECTOR BLAKE 1:1 (MISCELLANEOUS) IMPLANT
DRAPE ARM DVNC X/XI (DISPOSABLE) ×12 IMPLANT
DRAPE COLUMN DVNC XI (DISPOSABLE) ×3 IMPLANT
DRAPE CV SPLIT W-CLR ANES SCRN (DRAPES) ×3 IMPLANT
DRAPE DA VINCI XI ARM (DISPOSABLE) ×8
DRAPE DA VINCI XI COLUMN (DISPOSABLE) ×2
DRAPE INCISE IOBAN 66X45 STRL (DRAPES) IMPLANT
DRAPE ORTHO SPLIT 77X108 STRL (DRAPES) ×2
DRAPE SURG ORHT 6 SPLT 77X108 (DRAPES) ×3 IMPLANT
ELECT REM PT RETURN 9FT ADLT (ELECTROSURGICAL) ×2
ELECTRODE REM PT RTRN 9FT ADLT (ELECTROSURGICAL) ×3 IMPLANT
GAUZE KITTNER 4X5 RF (MISCELLANEOUS) IMPLANT
GAUZE SPONGE 4X4 12PLY STRL (GAUZE/BANDAGES/DRESSINGS) ×3 IMPLANT
GLOVE BIO SURGEON STRL SZ7 (GLOVE) ×3 IMPLANT
GLOVE BIO SURGEON STRL SZ7.5 (GLOVE) ×9 IMPLANT
GOWN STRL REUS W/ TWL LRG LVL3 (GOWN DISPOSABLE) ×3 IMPLANT
GOWN STRL REUS W/ TWL XL LVL3 (GOWN DISPOSABLE) ×6 IMPLANT
GOWN STRL REUS W/TWL 2XL LVL3 (GOWN DISPOSABLE) ×3 IMPLANT
GOWN STRL REUS W/TWL LRG LVL3 (GOWN DISPOSABLE) ×2
GOWN STRL REUS W/TWL XL LVL3 (GOWN DISPOSABLE) ×4
GRASPER SUT TROCAR 14GX15 (MISCELLANEOUS) IMPLANT
HEMOSTAT SURGICEL 2X14 (HEMOSTASIS) IMPLANT
IV NS 1000ML (IV SOLUTION)
IV NS 1000ML BAXH (IV SOLUTION) IMPLANT
KIT BASIN OR (CUSTOM PROCEDURE TRAY) ×3 IMPLANT
KIT TURNOVER KIT B (KITS) ×3 IMPLANT
MARKER SKIN DUAL TIP RULER LAB (MISCELLANEOUS) ×3 IMPLANT
NEEDLE HYPO 22GX1.5 SAFETY (NEEDLE) ×3 IMPLANT
NS IRRIG 1000ML POUR BTL (IV SOLUTION) ×6 IMPLANT
OBTURATOR OPTICAL STANDARD 8MM (TROCAR)
OBTURATOR OPTICAL STND 8 DVNC (TROCAR)
OBTURATOR OPTICALSTD 8 DVNC (TROCAR) ×3 IMPLANT
OIL SILICONE PENTAX (PARTS (SERVICE/REPAIRS)) IMPLANT
PACK CHEST (CUSTOM PROCEDURE TRAY) ×3 IMPLANT
PAD ARMBOARD 7.5X6 YLW CONV (MISCELLANEOUS) ×6 IMPLANT
RELOAD STAPLE 45 3.5 BLU DVNC (STAPLE) IMPLANT
RELOAD STAPLER 3.5X45 BLU DVNC (STAPLE) ×8 IMPLANT
SEAL CANN UNIV 5-8 DVNC XI (MISCELLANEOUS) ×12 IMPLANT
SEAL XI 5MM-8MM UNIVERSAL (MISCELLANEOUS) ×6
SEALER SYNCHRO 8 IS4000 DV (MISCELLANEOUS) ×2
SEALER SYNCHRO 8 IS4000 DVNC (MISCELLANEOUS) IMPLANT
SEALER VESSEL DA VINCI XI (MISCELLANEOUS) ×2
SEALER VESSEL EXT DVNC XI (MISCELLANEOUS) IMPLANT
SET TUBE SMOKE EVAC HIGH FLOW (TUBING) ×3 IMPLANT
STAPLER 45 DA VINCI SURE FORM (STAPLE) ×2
STAPLER 45 SUREFORM DVNC (STAPLE) IMPLANT
STAPLER RELOAD 3.5X45 BLU DVNC (STAPLE) ×8
STAPLER RELOAD 3.5X45 BLUE (STAPLE) ×8
SUT ETHIBOND 0 36 GRN (SUTURE) ×6 IMPLANT
SUT SILK  1 MH (SUTURE) ×2
SUT SILK 1 MH (SUTURE) ×3 IMPLANT
SUT VIC AB 2-0 CT1 27 (SUTURE) ×2
SUT VIC AB 2-0 CT1 TAPERPNT 27 (SUTURE) IMPLANT
SUT VIC AB 3-0 SH 27 (SUTURE) ×4
SUT VIC AB 3-0 SH 27X BRD (SUTURE) ×6 IMPLANT
SUT VICRYL 0 TIES 12 18 (SUTURE) IMPLANT
SYR 20ML ECCENTRIC (SYRINGE) ×3 IMPLANT
SYSTEM SAHARA CHEST DRAIN ATS (WOUND CARE) IMPLANT
TAPE CLOTH 4X10 WHT NS (GAUZE/BANDAGES/DRESSINGS) IMPLANT
TOWEL GREEN STERILE (TOWEL DISPOSABLE) ×3 IMPLANT
TOWEL GREEN STERILE FF (TOWEL DISPOSABLE) ×3 IMPLANT
TRAY FOLEY MTR SLVR 16FR STAT (SET/KITS/TRAYS/PACK) ×3 IMPLANT
TROCAR PORT AIRSEAL 8X120 (TROCAR) IMPLANT
TUBE CONNECTING 20X1/4 (TUBING) ×3 IMPLANT
TUBING ENDO SMARTCAP (MISCELLANEOUS) ×3 IMPLANT
UNDERPAD 30X36 HEAVY ABSORB (UNDERPADS AND DIAPERS) ×3 IMPLANT
WATER STERILE IRR 1000ML POUR (IV SOLUTION) ×6 IMPLANT

## 2022-03-30 NOTE — Brief Op Note (Signed)
03/30/2022  10:23 AM  PATIENT:  [name] [name]  71 y.o. male  PRE-OPERATIVE DIAGNOSIS:  ESOPHAGEAL DIVERTICULUM  POST-OPERATIVE DIAGNOSIS:  ESOPHAGEAL DIVERTICULUM  PROCEDURE:  Procedure(s): XI ROBOTIC ASSISTED Esophagomyotomy, diverticulectomy (Right) ESOPHAGOGASTRODUODENOSCOPY (EGD) (N/A) INTERCOSTAL NERVE BLOCK (Right)  SURGEON:  Surgeon(s) and Role:   Lightfoot, Lucile Crater, MD - Primary  PHYSICIAN ASSISTANT: Wynelle Beckmann PA-C, Jadene Pierini PA-C  ASSISTANTS: none   ANESTHESIA:   local and general  EBL:  50cc   BLOOD ADMINISTERED:none  DRAINS:  right pleural chest tube    LOCAL MEDICATIONS USED:  BUPIVICAINE  and EXPAREL  SPECIMEN:  Source of Specimen:  Esophageal diverticulum  DISPOSITION OF SPECIMEN:  PATHOLOGY  COUNTS CORRECT:  YES  TOURNIQUET:  * No tourniquets in log *  DICTATION: .Dragon Dictation  PLAN OF CARE: Admit to inpatient   PATIENT DISPOSITION:  PACU - hemodynamically stable.   Delay start of Pharmacological VTE agent (>24hrs) due to surgical blood loss or risk of bleeding: no  Complications: No Known

## 2022-03-30 NOTE — Transfer of Care (Signed)
Immediate Anesthesia Transfer of Care Note  Patient: [name] [name]  Procedure(s) Performed: XI ROBOTIC ASSISTED Esophagomyotomy, diverticulectomy (Right: Chest) ESOPHAGOGASTRODUODENOSCOPY (EGD) (Chest) INTERCOSTAL NERVE BLOCK (Right: Chest)  Patient Location: PACU  Anesthesia Type:General  Level of Consciousness: awake, alert  and oriented  Airway & Oxygen Therapy: Patient Spontanous Breathing and Patient connected to face mask oxygen  Post-op Assessment: Report given to RN, Post -op Vital signs reviewed and stable and Patient moving all extremities X 4  Post vital signs: Reviewed and stable  Last Vitals:  Vitals Value Taken Time  BP 143/129 03/30/22 1046  Temp    Pulse 67 03/30/22 1048  Resp 24 03/30/22 1048  SpO2 93 % 03/30/22 1048  Vitals shown include unvalidated device data.  Last Pain:  Vitals:   03/30/22 0600  TempSrc: Oral  PainSc: 0-No pain         Complications: No notable events documented.

## 2022-03-30 NOTE — Discharge Instructions (Signed)
Discharge Instructions:  Continue puree diet until you follow up with Dr. Cliffton Asters on 09/08  2. Crush up all medications before taking until you follow up with Dr. Cliffton Asters on 09/08  3. You may shower, please wash incisions daily with soap and water and keep dry.  If you wish to cover wounds with dressing you may do so but please keep clean and change daily.  No tub baths or swimming until incisions have completely healed.  If your incisions become red or develop any drainage please call our office at [phone]

## 2022-03-30 NOTE — Hospital Course (Signed)
History of present Illness   [name] [name] 71 y.o. male referred for evaluation of a mid esophageal diverticulum. Hx of Ross procedure in the past.  Since May of this year he has developed a worsening cough and hoarseness.  He states that he has always had issues with throat clearing his entire life.  With his symptoms worsening over this past season, he sought care with his primary care physician who then ordered an esophagram.  This identified the esophageal diverticulum.  He also endorses some reflux symptoms which improved with antiacid medication. Over the past month he has been unable to lay flat without having some coughing and regurgitation issues. Dr. Kipp Brood reviewed  the patient's history, labs and radiology studies for possible surgical intervention. It was thought that surgical diverticulectomy would provide the best long term outcome for this patient. Dr. Kipp Brood discussed the patient's treatment options as well as the risks and benefits of surgery with the patient and family. After careful consideration the patient decided to move forward with esophageal diverticulectomy surgery.   Hospital Course:  [name] [name] presented to [facility] on 03/30/2022. He was brought to the operating room and underwent a robotic assisted esophagomyotomy and esophageal diverticulectomy. He tolerated the procedure well, was extubated without complication and transferred to the progressive unit in stable condition. He was kept NPO until an esophagram was performed. The esophagram performed on 03/31/2022 showed no esophageal leak or remaining diverticulum. He was able to advance to a dysphagia 1 diet. His CXR on POD 1 showed a minimal right apical pneumothorax with minimal subcutaneous air at the lateral right chest wall and small left pleural effusion. Patient will be restarted on home lasix at discharge. His right pleural chest tube showed no air leak and he was saturating well on room air so his chest  tube was removed without complication. His CXR after chest tube removal showed stable to improved minimal right apical pneumothorax and improved subcutaneous emphysema at the right lateral chest wall. He was felt stable for discharge home on a pureed dysphagia 1 diet.

## 2022-03-30 NOTE — Anesthesia Procedure Notes (Signed)
Procedure Name: Intubation Date/Time: 03/30/2022 7:42 AM  Performed by: Mariea Clonts, CRNAPre-anesthesia Checklist: Patient identified, Emergency Drugs available, Suction available, Patient being monitored and Timeout performed Patient Re-evaluated:Patient Re-evaluated prior to induction Oxygen Delivery Method: Circle system utilized Preoxygenation: Pre-oxygenation with 100% oxygen Induction Type: IV induction Ventilation: Mask ventilation without difficulty Laryngoscope Size: Mac and 4 Grade View: Grade I Tube type: Oral Endobronchial tube: Left and Double lumen EBT and 39 Fr Number of attempts: 1 Airway Equipment and Method: Stylet Placement Confirmation: ETT inserted through vocal cords under direct vision, positive ETCO2 and breath sounds checked- equal and bilateral Tube secured with: Tape Dental Injury: Teeth and Oropharynx as per pre-operative assessment

## 2022-03-30 NOTE — Interval H&P Note (Signed)
History and Physical Interval Note:  03/30/2022 7:10 AM  [name] [name]  has presented today for surgery, with the diagnosis of ESOPHAGEAL DIVERTICULUM.  The various methods of treatment have been discussed with the patient and family. After consideration of risks, benefits and other options for treatment, the patient has consented to  Procedure(s): XI ROBOTIC ASSISTED Esophagomyotomy, diverticulectomy (Right) ESOPHAGOGASTRODUODENOSCOPY (EGD) (N/A) as a surgical intervention.  The patient's history has been reviewed, patient examined, no change in status, stable for surgery.  I have reviewed the patient's chart and labs.  Questions were answered to the patient's satisfaction.     Celina Shiley Bary Leriche

## 2022-03-30 NOTE — Anesthesia Postprocedure Evaluation (Signed)
Anesthesia Post Note  Patient: [name] [name]  Procedure(s) Performed: XI ROBOTIC ASSISTED Esophagomyotomy, diverticulectomy (Right: Chest) ESOPHAGOGASTRODUODENOSCOPY (EGD) (Chest) INTERCOSTAL NERVE BLOCK (Right: Chest)     Patient location during evaluation: PACU Anesthesia Type: General Level of consciousness: awake and alert Pain management: pain level controlled Vital Signs Assessment: post-procedure vital signs reviewed and stable Respiratory status: spontaneous breathing, nonlabored ventilation, respiratory function stable and patient connected to nasal cannula oxygen Cardiovascular status: blood pressure returned to baseline and stable Postop Assessment: no apparent nausea or vomiting Anesthetic complications: no   No notable events documented.  Last Vitals:  Vitals:   03/30/22 1430 03/30/22 1522  BP: (!) 143/76 (!) 142/89  Pulse: (!) 58 61  Resp: 19 16  Temp: 36.5 C 36.4 C  SpO2: 95% 96%    Last Pain:  Vitals:   03/30/22 1522  TempSrc: Oral  PainSc:                  Effie Berkshire

## 2022-03-30 NOTE — Op Note (Signed)
[address]       [place] [address]             [phone]        03/30/2022  Patient:  [name] [name] Pre-Op Dx: Mid-esophageal diverticulum   Post-op Dx:  same Procedure: - Esophagoscopy - Right robotic assisted thoracoscopy - Esophagomyotomy - Diverticulectomy - Intercostal nerve block   Surgeon and Role:      * Lajuana Matte, MD - Primary  Assistant: B. Stehler, PA-C  An experienced assistant was required given the complexity of this surgery and the standard of surgical care. The assistant was needed for exposure, dissection, suctioning, retraction of delicate tissues and sutures, instrument exchange and for overall help during this procedure.   Anesthesia  general EBL:  8m Blood Administration: none Specimen:  esophageal diverticulum   Counts: correct   Indications: 71year old male with a mid esophageal diverticulum.  He has evidence of some dysmotility on esophagram.  He will need formal EGD for surgical planning.  This will occur intra-operatively.  He also has a history of a dilated aortic root, but [place]  He has been followed with serial imaging.  The most recent CT scan it measured 5.8 cm, however in 2019 it measured 6.3 cm.  On review of his echocardiogram from 2022 he only has mild regurgitation in his autograft in the aortic position.  There is no evidence of bioprosthetic pulmonic valve regurgitation.  He is also on Eliquis this will need to be held for 2 to 3 days prior to surgery.  Findings: Patulous esophagus.  Mid-esophageal diverticulum was difficult to identify due to a soft ledge.    Mid-esophageal diverticulum noted on thoracoscopy.  Tethered to enlarged subcarinal lymph nodes.  Appeared more like a traction diverticulum.  Air leak test was negative.  Operative Technique: After the risks, benefits and alternatives were thoroughly discussed, the patient was brought to the  operative theatre.  Anesthesia was induced, and the esophagoscope was passed through the oropharynx down to the stomach.  The scope was retroflexed.  On retrograde examination of the esophagus.   The scope was then parked in the stomach.  The patient was then positioned in left lateral decubitus and prepped and draped in normal sterile fashion.  An appropriate surgical pause was performed, and pre-operative antibiotics were dosed accordingly.  We began with a 1 cm incision 15 cm caudad from the xiphoid and slightly lateral to the umbilicus.  Using an Optiview we entered the peritoneal space.  The abdomen was then insufflated with CO2.  3 other robotic ports were placed to triangulate the hiatus.  Another 12 mm port was placed in place at the level of the umbilicus laterally for an assistant port and another 5 mm trocar was placed in the right lower quadrant for liver retractor.  The patient was then placed in steep reverse Trendelenburg and the liver was elevated to expose the esophageal hiatus.  And then the robot was docked.  We began by dividing the gastrohepatic ligament to expose the right diaphragmatic crus and then dissected the esophagus into the mediastinum.  We then divided the short gastrics and moved towards the right crus and completed our dissection along the esophageal hiatus.  A Penrose drain was then used to encircle the the esophagus and we continued our dissection up into the mediastinum.  The stomach was then retracted superiorly, and we mobilized  it off of the pancreas.  The left gastric artery was then isolated and divided with a robotic stabler.  ICG was then injected through his central line, and good blood flow to the stomach was evident.  We then marked an area away from the right gastroepiploic artery, and began to divide the omentum.    The robotic ports were placed to triangulate the esophagus.  We continued our mobilization of the esophagus from the inferior pulmonary ligament.   There was some dense scar tissue around the subcarinal lymph nodes, and esophagus appeared tethered to these nodes.  Care was taken to mobilized the diverticulum.  An esophagomyotomy was performed down to the mucosa distal to the bottom edge of the diverticulum.  The diverticulum was then resected with a robotic stapler.  The EGD was inplace to ensure that we did not stenose the esophagus.  We then used the endoscope to perform an air leak test.  None was present.  The ports were removed, and a 35F chest tube was placed.  An intercostal nerve block was performed.  The lung was expanded.  The incisions were closed with absorbable suture.    The patient tolerated the procedure without any immediate complications, and was transferred to the PACU in stable condition.  Alfreida Steffenhagen Bary Leriche

## 2022-03-30 NOTE — Discharge Summary (Signed)
Physician Discharge Summary  Patient ID: BEECHER FURIO MRN: [id number] DOB/AGE: 01/25/1951 71 y.o.  Admit date: 03/30/2022 Discharge date: 03/31/2022  Admission Diagnoses:  Discharge Diagnoses:  Principal Problem:   Epiphrenic diverticulum   Discharged Condition: good  History of present Illness   [name] [name] 71 y.o. male referred for evaluation of a mid esophageal diverticulum. Hx of Ross procedure in the past.  Since May of this year he has developed a worsening cough and hoarseness.  He states that he has always had issues with throat clearing his entire life.  With his symptoms worsening over this past season, he sought care with his primary care physician who then ordered an esophagram.  This identified the esophageal diverticulum.  He also endorses some reflux symptoms which improved with antiacid medication. Over the past month he has been unable to lay flat without having some coughing and regurgitation issues. Dr. Kipp Brood reviewed  the patient's history, labs and radiology studies for possible surgical intervention. It was thought that surgical diverticulectomy would provide the best long term outcome for this patient. Dr. Kipp Brood discussed the patient's treatment options as well as the risks and benefits of surgery with the patient and family. After careful consideration the patient decided to move forward with esophageal diverticulectomy surgery.   Hospital Course:  [name] presented to [facility] on [facility]30[facility]. He was brought to the operating room and underwent a robotic assisted esophagomyotomy and esophageal diverticulectomy. He tolerated the procedure well, was extubated without complication and transferred to the progressive unit in stable condition. He was kept NPO until an esophagram was performed. The esophagram performed on 03/31/2022 showed no esophageal leak or remaining diverticulum. He was able to advance to a dysphagia 1 diet. His CXR on POD 1  showed a minimal right apical pneumothorax with minimal subcutaneous air at the lateral right chest wall and small left pleural effusion. Patient will be restarted on home lasix at discharge. His right pleural chest tube showed no air leak and he was saturating well on room air so his chest tube was removed without complication. His CXR after chest tube removal showed stable to improved minimal right apical pneumothorax and improved subcutaneous emphysema at the right lateral chest wall. He was felt stable for discharge home on a pureed dysphagia 1 diet.   Consults: None  Significant Diagnostic Studies:   CLINICAL DATA:  Postop day 1 status post resection of esophageal diverticulum. Request to evaluate for leak.   EXAM: ESOPHAGUS/BARIUM SWALLOW/TABLET STUDY   TECHNIQUE: Single contrast examination was performed using water-soluble contrast (Omnipaque 300). This exam was performed by Narda Rutherford, NP, and was supervised and interpreted by Logan Bores, MD.   FLUOROSCOPY: Radiation Exposure Index (as provided by the fluoroscopic device): 59.60 mGy Kerma   COMPARISON:  Esophagram 01/20/2022   FINDINGS: A limited water-soluble esophagram was performed. The patient drank contrast without difficulty. Contrast passed readily through the esophagus and into the stomach without evidence of a leak or obstruction.   IMPRESSION: No evidence of esophageal leak.     Electronically Signed   By: Logan Bores M.D.   On: 03/31/2022 09:44 CLINICAL DATA:  A 71 year old male presents with symptoms of reflux and occasional globus sensation.   EXAM: ESOPHOGRAM/BARIUM SWALLOW   TECHNIQUE: Single contrast examination was performed using  thin barium.   FLUOROSCOPY: Radiation Exposure Index (as provided by the fluoroscopic device): 81.3 mGy Kerma   COMPARISON:  Chest CT from April 28, 2018.   FINDINGS: Swallowing  first performed in the AP and lateral projections without gross swallow  dysfunction. Signs of w moderate CP bar. And evidence of degenerative changes about the cervical spine with mild impression upon the cervical esophagus.   Single contrast was utilized due to limited patient mobility particularly with LEFT arm.   No fixed narrowing of the esophagus. Moderate size mid esophageal diverticulum measuring up to 3.4 cm with smooth contour, assessment of prior CT from 2019 shows that this is likely present also at that time.   Single swallow w assessment with intact primary wave. Mild stasis in the proximal esophagus of approximately 1/3 of the bolus. Mild secondary and tertiary peristaltic activity.   Barium tablet was swallowed and passed without difficulty. No gastroesophageal reflux was observed.   IMPRESSION: 1. Mild dysmotility. 2. Moderate mid esophageal diverticulum arising from the RIGHT lateral soft Gus. 3. No signs of narrowing or gross lesion on single contrast evaluation. 4. No signs of gastroesophageal reflux or hiatal hernia.     Electronically Signed   By: Zetta Bills M.D.   On: 01/20/2022 11:50   Treatments: Surgery 03/30/2022   Patient:  [name] [name] Pre-Op Dx: Mid-esophageal diverticulum   Post-op Dx:  same Procedure: - Esophagoscopy - Right robotic assisted thoracoscopy - Esophagomyotomy - Diverticulectomy - Intercostal nerve block   Surgeon and Role:      * Lajuana Matte, MD - Primary   Assistant: B. Miral Hoopes, PA-C  An experienced assistant was required given the complexity of this surgery and the standard of surgical care. The assistant was needed for exposure, dissection, suctioning, retraction of delicate tissues and sutures, instrument exchange and for overall help during this procedure.   Anesthesia  general EBL:  21m Blood Administration: none Specimen:  esophageal diverticulum     PATHOLOGY:  FINAL MICROSCOPIC DIAGNOSIS:   A. ESOPHAGEAL DIVERTICULUM:  - Consistent with clinically stated  benign esophageal diverticulum  - No evidence of dysplasia or malignancy  GROSS DESCRIPTION:   Received fresh is a 1.9 x 1.5 x 1.0 cm fragment of pink-tan membranous  tissue with staple line present along 1 aspect, consistent with the  clinically stated "diverticulum".  The diverticulum is sectioned  revealing an underlying pink-tan cut surface without distinct lesions.  Representative sections are submitted in 1 block.  (KW, 03/30/2022)    Final Diagnosis performed by NJaquita Folds MD.   Electronically  signed 03/31/2022   Discharge Exam: Blood pressure 119/80, pulse (!) 59, temperature 98 F (36.7 C), temperature source Oral, resp. rate 16, height '6\' 3"'$  (1.905 m), weight 100 kg, SpO2 96 %. General appearance: alert, cooperative, and no distress Resp: clear to auscultation bilaterally Cardio: regular rate and rhythm, S1, S2 normal, no murmur, click, rub or gallop GI: soft, non-tender; bowel sounds normal; no masses,  no organomegaly Extremities: extremities normal, atraumatic, no cyanosis or edema Incision/Wound: Clean and dry, no erythema or drainage  Disposition: Discharge disposition: 01-Home or Self Care       Allergies as of 03/31/2022       Reactions   Penicillins Hives, Swelling   Quinidine Other (See Comments)   Increase heart beat per patient        Medication List     TAKE these medications    apixaban 5 MG Tabs tablet Commonly known as: ELIQUIS Take 1 tablet (5 mg total) by mouth 2 (two) times daily.   azelastine 0.1 % nasal spray Commonly known as: ASTELIN Place 2 sprays into both nostrils 2 (two)  times daily.   cetirizine 10 MG tablet Commonly known as: ZYRTEC TAKE 1 TABLET(10 MG) BY MOUTH DAILY What changed: See the new instructions.   famotidine 40 MG tablet Commonly known as: PEPCID Take 40 mg by mouth 2 (two) times daily.   finasteride 5 MG tablet Commonly known as: PROSCAR TAKE 1 TABLET(5 MG) BY MOUTH DAILY What changed:  how  much to take how to take this when to take this additional instructions   fluticasone 50 MCG/ACT nasal spray Commonly known as: FLONASE Place 2 sprays into both nostrils as needed for allergies or rhinitis.   furosemide 20 MG tablet Commonly known as: LASIX TAKE 1 TABLET(20 MG) BY MOUTH EVERY OTHER DAY What changed: See the new instructions.   Jardiance 10 MG Tabs tablet Generic drug: empagliflozin Take 1 tablet by mouth daily before breakfast.   loperamide 2 MG capsule Commonly known as: IMODIUM Take 2 mg by mouth daily as needed for diarrhea or loose stools.   losartan 25 MG tablet Commonly known as: COZAAR Take 0.5 tablets (12.5 mg total) by mouth daily.   metoprolol succinate 100 MG 24 hr tablet Commonly known as: TOPROL-XL Take 1 tablet (100 mg total) by mouth 2 (two) times daily.   montelukast 10 MG tablet Commonly known as: SINGULAIR Take 1 tablet (10 mg total) by mouth at bedtime.   pantoprazole 40 MG tablet Commonly known as: PROTONIX Take 1 tablet (40 mg total) by mouth 2 (two) times daily.   rosuvastatin 40 MG tablet Commonly known as: CRESTOR TAKE 1 TABLET(40 MG) BY MOUTH DAILY What changed: See the new instructions.   spironolactone 25 MG tablet Commonly known as: ALDACTONE TAKE 1 TABLET(25 MG) BY MOUTH DAILY What changed:  how much to take how to take this when to take this additional instructions   traMADol 50 MG tablet Commonly known as: Ultram Take 1 tablet (50 mg total) by mouth every 6 (six) hours as needed for up to 20 doses.        Follow-up Information     Lajuana Matte, MD Follow up on 04/10/2022.   Specialty: Cardiothoracic Surgery Why: Follow up appointment is at Dr. Abran Duke office at 11:00AM Contact information: [address] [place] [address] [phone]                 Signed: Magdalene River, PA-C  03/31/2022, 3:03 PM

## 2022-03-31 ENCOUNTER — Inpatient Hospital Stay (HOSPITAL_COMMUNITY): Payer: HMO

## 2022-03-31 ENCOUNTER — Encounter (HOSPITAL_COMMUNITY): Payer: Self-pay | Admitting: Thoracic Surgery (Cardiothoracic Vascular Surgery)

## 2022-03-31 ENCOUNTER — Other Ambulatory Visit (HOSPITAL_COMMUNITY): Payer: Self-pay

## 2022-03-31 LAB — SURGICAL PATHOLOGY

## 2022-03-31 LAB — BASIC METABOLIC PANEL
Anion gap: 9 (ref 5–15)
BUN: 18 mg/dL (ref 8–23)
CO2: 22 mmol/L (ref 22–32)
Calcium: 8.8 mg/dL — ABNORMAL LOW (ref 8.9–10.3)
Chloride: 107 mmol/L (ref 98–111)
Creatinine, Ser: 0.82 mg/dL (ref 0.61–1.24)
GFR, Estimated: >60 mL/min (ref >60)
Glucose, Bld: 132 mg/dL — ABNORMAL HIGH (ref 70–99)
Potassium: 4.4 mmol/L (ref 3.5–5.1)
Sodium: 138 mmol/L (ref 135–145)

## 2022-03-31 LAB — GLUCOSE, CAPILLARY
Glucose-Capillary: 102 mg/dL — ABNORMAL HIGH (ref 70–99)
Glucose-Capillary: 109 mg/dL — ABNORMAL HIGH (ref 70–99)

## 2022-03-31 MED ORDER — TRAMADOL HCL 50 MG PO TABS
50.0000 mg | ORAL_TABLET | Freq: Four times a day (QID) | ORAL | 0 refills | Status: DC | PRN
  Filled 2022-03-31: qty 20, 5d supply, fill #0

## 2022-03-31 MED ORDER — ORAL CARE MOUTH RINSE: 15.0000 mL | OROMUCOSAL | Status: DC | PRN

## 2022-03-31 NOTE — TOC Transition Note (Signed)
Transition of Care [facility]) - CM/SW Discharge Note   Patient Details  Name: [name] [name] MRN: [id number] Date of Birth: 04-19-1951  Transition of Care [facility]) CM/SW Contact:  Angelita Ingles, RN Phone Number:[phone]  03/31/2022, 2:53 PM   Clinical Narrative:    Patient with discharge  orders no TOC needs noted.          Patient Goals and CMS Choice        Discharge Placement                       Discharge Plan and Services                                     Social Determinants of Health (SDOH) Interventions     Readmission Risk Interventions     No data to display

## 2022-03-31 NOTE — Progress Notes (Signed)
Mobility Specialist Progress Note    03/31/22 0952  Mobility  Activity Ambulated with assistance in hallway  Level of Assistance Standby assist, set-up cues, supervision of patient - no hands on  Assistive Device Front wheel walker  Distance Ambulated (ft) 420 ft  Activity Response Tolerated well  $Mobility charge 1 Mobility   Post-Mobility: 61 HR, 95% SpO2  Pt received in bed and agreeable. Had void in BR. No complaints on walk. Returned to bed with call bell in reach.    Hildred Alamin Mobility Specialist

## 2022-03-31 NOTE — Progress Notes (Signed)
     [address]       [place] [address]             [phone]       No events Pain well controlled  Vitals:   03/30/22 2300 03/31/22 0300  BP:    Pulse: 64 60  Resp:    Temp: 98.2 F (36.8 C) 98 F (36.7 C)  SpO2:     Alert NAD Sinus  EWOB SS CT output no leak  CXR: R stable, hx of elevated L diaphragm  POD 1 s/p R RATS, esophagomyotomy and diverticulectomy Esophagram today If stable will remove CT, start dysphagia 1 diet Possibly home today  [name] [name] O Merrill Villarruel

## 2022-03-31 NOTE — Plan of Care (Signed)
°  Problem: Clinical Measurements: °Goal: Respiratory complications will improve °Outcome: Progressing °Goal: Cardiovascular complication will be avoided °Outcome: Progressing °  °Problem: Activity: °Goal: Risk for activity intolerance will decrease °Outcome: Progressing °  °Problem: Coping: °Goal: Level of anxiety will decrease °Outcome: Progressing °  °Problem: Pain Managment: °Goal: General experience of comfort will improve °Outcome: Progressing °  °

## 2022-04-02 ENCOUNTER — Ambulatory Visit: Payer: HMO | Admitting: Family Medicine

## 2022-04-07 ENCOUNTER — Ambulatory Visit: Payer: HMO | Admitting: Physician Assistant

## 2022-04-07 ENCOUNTER — Ambulatory Visit: Payer: Medicare Other | Admitting: Thoracic Surgery (Cardiothoracic Vascular Surgery)

## 2022-04-07 VITALS — BP 123/79 | HR 61 | Resp 20 | Ht 75.0 in | Wt 213.1 lb

## 2022-04-07 DIAGNOSIS — I7121 Aneurysm of the ascending aorta, without rupture: Secondary | ICD-10-CM | POA: Diagnosis not present

## 2022-04-07 NOTE — Progress Notes (Signed)
[address]       [name] [address]             [phone]       HPI: [name] returns for a scheduled follow-up visit regarding his aortic root aneurysm.  [name] [name] is a 71 year old man with a past history of congenital aortic insufficiency, Ross procedure, hiatal hernia, reflux, esophageal stricture, esophageal diverticulum, hypertension, elevated left hemidiaphragm, and left brachial plexopathy.  He had a Ross procedure in [name] in 1994.  He had an MRI in 2015 which showed a 5.4 cm aortic root at the level of the sinuses of Valsalva.  Dr. Servando Snare was following him.  He recent was having some issues related to esophageal diverticulum and Dr. Kipp Brood did a repair about a week ago.  He is doing well with that so far.  He has minimal pain.  Past Medical History:  Diagnosis Date   Acute gastritis    Aortic insufficiency    s/p Ross Procedure: AVR/Pulmonary autograft in aortic position & PVR with bioprosthetic PV 1994 in [name]   Aortic root aneurysm (HCC)    Atrial flutter (HCC)    history- no atrial flutter since cardioversion   CHF (congestive heart failure) (HCC)    Diabetes mellitus without complication ([place])    type 2   Early satiety    Esophageal stricture    GERD (gastroesophageal reflux disease)    Headache syndrome 10/18/2018   Hiatal hernia    Hyperlipidemia    Neuropathy    left brachial plexus   Pain, dental    Sleep apnea    09/12/20 AHI 35.2/hr. Does not use CPAP. (03/26/22)    Current Outpatient Medications  Medication Sig Dispense Refill   apixaban (ELIQUIS) 5 MG TABS tablet Take 1 tablet (5 mg total) by mouth 2 (two) times daily. 180 tablet 3   azelastine (ASTELIN) 0.1 % nasal spray Place 2 sprays into both nostrils 2 (two) times daily. 30 mL 12   cetirizine (ZYRTEC) 10 MG tablet TAKE 1 TABLET(10 MG) BY MOUTH DAILY (Patient taking differently: Take 10 mg by mouth daily.) 90 tablet 0   empagliflozin (JARDIANCE) 10 MG  TABS tablet Take 1 tablet by mouth daily before breakfast. 90 tablet 3   famotidine (PEPCID) 40 MG tablet Take 40 mg by mouth 2 (two) times daily.     finasteride (PROSCAR) 5 MG tablet TAKE 1 TABLET(5 MG) BY MOUTH DAILY (Patient taking differently: Take 5 mg by mouth daily.) 90 tablet 1   fluticasone (FLONASE) 50 MCG/ACT nasal spray Place 2 sprays into both nostrils as needed for allergies or rhinitis. 50 mL 30   furosemide (LASIX) 20 MG tablet TAKE 1 TABLET(20 MG) BY MOUTH EVERY OTHER DAY (Patient taking differently: Take 20 mg by mouth every other day.) 15 tablet 11   loperamide (IMODIUM) 2 MG capsule Take 2 mg by mouth daily as needed for diarrhea or loose stools.     losartan (COZAAR) 25 MG tablet Take 0.5 tablets (12.5 mg total) by mouth daily. 45 tablet 3   metoprolol succinate (TOPROL-XL) 100 MG 24 hr tablet Take 1 tablet (100 mg total) by mouth 2 (two) times daily. 180 tablet 0   montelukast (SINGULAIR) 10 MG tablet Take 1 tablet (10 mg total) by mouth at bedtime. 30 tablet 3   pantoprazole (PROTONIX) 40 MG tablet Take 1 tablet (40 mg total) by mouth 2 (two) times daily. 180 tablet 1  rosuvastatin (CRESTOR) 40 MG tablet TAKE 1 TABLET(40 MG) BY MOUTH DAILY (Patient taking differently: Take 40 mg by mouth daily.) 90 tablet 3   spironolactone (ALDACTONE) 25 MG tablet TAKE 1 TABLET(25 MG) BY MOUTH DAILY (Patient taking differently: Take 25 mg by mouth daily.) 90 tablet 3   No current facility-administered medications for this visit.    Physical Exam: BP 123/79 (BP Location: Right Arm, Patient Position: Sitting, Cuff Size: Normal)   Pulse 61   Resp 20   Ht '6\' 3"'$  (1.905 m)   Wt 213 lb 1.9 oz (96.7 kg)   SpO2 98% Comment: RA  BMI 26.64 kg/m  Well-appearing 71 year old man in no acute distress Alert and oriented x3 with no focal deficits Lungs diminished breath sounds at left base but otherwise clear Cardiac regular rate and rhythm, normal S1 and S2, no murmurs No peripheral  edema  Diagnostic Tests: MRA CHEST WITH OR WITHOUT CONTRAST   TECHNIQUE: Angiographic images of the chest were obtained using MRA technique with intravenous contrast.   CONTRAST:  64m MULTIHANCE GADOBENATE DIMEGLUMINE 529 MG/ML IV SOLN   COMPARISON:  Multiple prior studies. The most recent is an MRA of the chest on 01/21/2021.   FINDINGS: The current study is impaired by respiratory motion, especially in the latter parts of the exam including all of the contrast enhanced imaging. The patient was apparently short of breath during the exam.   VASCULAR   Aorta: Aortic root is difficult to accurately measure on the current study but is estimated to remain approximately 5.4 cm in estimated maximal diameter. Proximal ascending thoracic aorta just above the root measures approximately 4.8 cm in maximum diameter. The arch measures approximately 3.0-3.3 cm. The descending thoracic aorta measures 2.7-3.0 cm. No evidence of aortic dissection. Visualized proximal great vessels demonstrate normal patency.   Heart: Mild cardiac enlargement.  No visualized pericardial fluid.   Pulmonary Arteries:  Normal caliber central pulmonary arteries.   NON-VASCULAR   No visualized incidental masses, lymphadenopathy or pleural fluid. Visualized bony structures are unremarkable.   IMPRESSION: Limited MRA evaluation on today's study due to respiratory motion artifact. Maximal caliber of aortic root dilatation is felt to be stable at approximately 5.4 cm. Consider future follow-up with CTA of the chest given that there may be increasing difficulty tolerating MRI studies.     Electronically Signed   By: GAletta EdouardM.D.   On: 02/09/2022 16:36   I personally reviewed the MR images and concur with the findings noted above.  Impression: WLatonya Knightis a 71year old man with a past history of congenital aortic insufficiency, Ross procedure, hiatal hernia, reflux, esophageal stricture,  esophageal diverticulum, hypertension, elevated left hemidiaphragm, and left brachial plexopathy.  [place]  He had an MRI in 2015 which showed a 5.4 cm aortic root at the level of the sinuses of Valsalva.   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  We will plan a CT angiogram in 6 months to follow-up on that.  Esophageal diverticulum -status post robotic repair.  Looks well and is not having any pain.  Has a follow-up appointment with Dr. LKipp Broodon Friday.  Plan: Follow-up with Dr. LKipp Broodas scheduled Return in 6 months with CT angio of chest to follow-up neoaortic root aneurysm   SMelrose Nakayama MD [facility] ([phone]

## 2022-04-09 ENCOUNTER — Ambulatory Visit (INDEPENDENT_AMBULATORY_CARE_PROVIDER_SITE_OTHER): Payer: HMO | Admitting: Family Medicine

## 2022-04-09 ENCOUNTER — Encounter: Payer: Self-pay | Admitting: Family Medicine

## 2022-04-09 VITALS — BP 116/66 | HR 61 | Temp 98.2°F | Resp 16 | Ht 75.0 in | Wt 211.4 lb

## 2022-04-09 DIAGNOSIS — I10 Essential (primary) hypertension: Secondary | ICD-10-CM | POA: Diagnosis not present

## 2022-04-09 DIAGNOSIS — E118 Type 2 diabetes mellitus with unspecified complications: Secondary | ICD-10-CM

## 2022-04-09 DIAGNOSIS — E782 Mixed hyperlipidemia: Secondary | ICD-10-CM | POA: Diagnosis not present

## 2022-04-09 LAB — LIPID PANEL
Cholesterol: 100 mg/dL (ref 0–200)
HDL: 32 mg/dL — ABNORMAL LOW (ref >39.00)
LDL Cholesterol: 49 mg/dL (ref 0–99)
NonHDL: 68.17
Total CHOL/HDL Ratio: 3
Triglycerides: 98 mg/dL (ref 0.0–149.0)
VLDL: 19.6 mg/dL (ref 0.0–40.0)

## 2022-04-09 LAB — CBC WITH DIFFERENTIAL/PLATELET
Basophils Absolute: 0 10*3/uL (ref 0.0–0.1)
Basophils Relative: 0.3 % (ref 0.0–3.0)
Eosinophils Absolute: 0.1 10*3/uL (ref 0.0–0.7)
Eosinophils Relative: 1.1 % (ref 0.0–5.0)
HCT: 42 % (ref 39.0–52.0)
Hemoglobin: 14.3 g/dL (ref 13.0–17.0)
Lymphocytes Relative: 17.5 % (ref 12.0–46.0)
Lymphs Abs: 1.5 10*3/uL (ref 0.7–4.0)
MCHC: 34 g/dL (ref 30.0–36.0)
MCV: 88.3 fl (ref 78.0–100.0)
Monocytes Absolute: 0.9 10*3/uL (ref 0.1–1.0)
Monocytes Relative: 10.8 % (ref 3.0–12.0)
Neutro Abs: 5.9 10*3/uL (ref 1.4–7.7)
Neutrophils Relative %: 70.3 % (ref 43.0–77.0)
Platelets: 289 10*3/uL (ref 150.0–400.0)
RBC: 4.76 Mil/uL (ref 4.22–5.81)
RDW: 13.7 % (ref 11.5–15.5)
WBC: 8.3 10*3/uL (ref 4.0–10.5)

## 2022-04-09 LAB — HEPATIC FUNCTION PANEL
ALT: 23 U/L (ref 0–53)
AST: 21 U/L (ref 0–37)
Albumin: 4.1 g/dL (ref 3.5–5.2)
Alkaline Phosphatase: 78 U/L (ref 39–117)
Bilirubin, Direct: 0.3 mg/dL (ref 0.0–0.3)
Total Bilirubin: 1.7 mg/dL — ABNORMAL HIGH (ref 0.2–1.2)
Total Protein: 7.2 g/dL (ref 6.0–8.3)

## 2022-04-09 LAB — BASIC METABOLIC PANEL
BUN: 13 mg/dL (ref 6–23)
CO2: 26 mEq/L (ref 19–32)
Calcium: 9.5 mg/dL (ref 8.4–10.5)
Chloride: 102 mEq/L (ref 96–112)
Creatinine, Ser: 0.85 mg/dL (ref 0.40–1.50)
GFR: 88.52 mL/min (ref >60.00)
Glucose, Bld: 124 mg/dL — ABNORMAL HIGH (ref 70–99)
Potassium: 4.3 mEq/L (ref 3.5–5.1)
Sodium: 137 mEq/L (ref 135–145)

## 2022-04-09 LAB — TSH: TSH: 1.67 u[IU]/mL (ref 0.35–5.50)

## 2022-04-09 LAB — MICROALBUMIN / CREATININE URINE RATIO
Creatinine,U: 61.6 mg/dL
Microalb Creat Ratio: 1.1 mg/g (ref 0.0–30.0)
Microalb, Ur: <0.7 mg/dL (ref 0.0–1.9)

## 2022-04-09 NOTE — Assessment & Plan Note (Signed)
Chronic problem.  On Crestor 40mg daily w/o difficulty.  Check labs.  Adjust meds prn  

## 2022-04-09 NOTE — Progress Notes (Signed)
   Subjective:    Patient ID: [name] [name], male    DOB: 1951/02/11, 71 y.o.   MRN: [id number]  HPI DM- chronic problem, on Jardiance '10mg'$  daily.  Last A1C 7.1%.  UTD on eye exam.  Foot exam and microalbumin due.  Currently on pureed diet due to recent esophageal surgery.  Denies symptomatic lows.  No numbness/tingling of hands/feet.  Hyperlipidemia- chronic problem, on Crestor '40mg'$  daily.  Denies abd pain, N/V.  HTN- chronic problem, on Lasix '20mg'$  daily, Losartan '25mg'$  daily, Metoprolol XL '100mg'$  daily, Spironolactone '25mg'$  daily w/ good control.  No CP, HAs, visual changes, edema.  Mild SOB since surgery due to fatigue.   Review of Systems For ROS see HPI     Objective:   Physical Exam Vitals reviewed.  Constitutional:      General: He is not in acute distress.    Appearance: Normal appearance. He is well-developed. He is not ill-appearing.  HENT:     Head: Normocephalic and atraumatic.  Eyes:     Extraocular Movements: Extraocular movements intact.     Conjunctiva/sclera: Conjunctivae normal.     Pupils: Pupils are equal, round, and reactive to light.  Neck:     Thyroid: No thyromegaly.  Cardiovascular:     Rate and Rhythm: Normal rate and regular rhythm.     Pulses: Normal pulses.     Heart sounds: Murmur (w/ valvular click) heard.  Pulmonary:     Effort: Pulmonary effort is normal. No respiratory distress.     Breath sounds: Normal breath sounds.  Abdominal:     General: Bowel sounds are normal. There is no distension.     Palpations: Abdomen is soft.  Musculoskeletal:     Cervical back: Normal range of motion and neck supple.     Right lower leg: No edema.     Left lower leg: No edema.  Lymphadenopathy:     Cervical: No cervical adenopathy.  Skin:    General: Skin is warm and dry.  Neurological:     General: No focal deficit present.     Mental Status: He is alert and oriented to person, place, and time.     Cranial Nerves: No cranial nerve deficit.   Psychiatric:        Mood and Affect: Mood normal.        Behavior: Behavior normal.           Assessment & Plan:

## 2022-04-09 NOTE — Patient Instructions (Signed)
Follow up in 3-4 months to recheck diabetes We'll notify you of your lab results and make any changes if needed Keep up the good work!  You look great! Allow yourself time to recover... REST!! Call with any questions or concerns Stay Safe!  Stay Healthy!

## 2022-04-09 NOTE — Assessment & Plan Note (Signed)
Chronic problem.  On Jardiance '10mg'$  daily w/ good control- A1C 7.1%.  UTD on eye exam.  Foot exam done today.  Microalbumin ordered.  Pt has lost considerable weight since having his esophageal issues.  Check labs.  Adjust meds prn

## 2022-04-09 NOTE — Assessment & Plan Note (Signed)
Chronic problem.  Well controlled on Lasix '20mg'$  daily, Losartan '25mg'$  daily, Metoprolol XL '100mg'$  daily, Spironolactone '25mg'$  daily.  Currently asymptomatic.  Check labs due to ARB and diuretics but no anticipated med changes.

## 2022-04-10 ENCOUNTER — Ambulatory Visit (INDEPENDENT_AMBULATORY_CARE_PROVIDER_SITE_OTHER): Payer: Self-pay | Admitting: Thoracic Surgery (Cardiothoracic Vascular Surgery)

## 2022-04-10 VITALS — BP 118/73 | HR 59 | Resp 18 | Ht 75.0 in | Wt 212.0 lb

## 2022-04-10 DIAGNOSIS — Z09 Encounter for follow-up examination after completed treatment for conditions other than malignant neoplasm: Secondary | ICD-10-CM

## 2022-04-10 DIAGNOSIS — Q396 Congenital diverticulum of esophagus: Secondary | ICD-10-CM

## 2022-04-10 NOTE — Progress Notes (Signed)
      [address]       [place] [address]             [phone]        Leemon F Goertz [place] Medical Record #[id number] Date of Birth: Feb 18, 1951  Referring: [name], MD Primary Care: [name], MD Primary Cardiologist:Dalton Aundra Dubin, MD  Reason for visit:   follow-up  History of Present Illness:     Mr. Woodcox presents for his 1 week follow-up appointment.  His dysphagia is much improved.  His scratchy throat has also somewhat improved.  Physical Exam: BP 118/73 (BP Location: Right Arm, Patient Position: Sitting)   Pulse (!) 59   Resp 18   Ht '6\' 3"'$  (1.905 m)   Wt 212 lb (96.2 kg)   SpO2 95% Comment: RA  BMI 26.50 kg/m   Alert NAD Incision clean.   Abdomen, ND No peripheral edema   Diagnostic Studies & Laboratory data:  Path: Benign diverticulum    Assessment / Plan:   71 year old male status post biotic assisted esophageal diverticulectomy and myotomy.  Overall he is doing well he will follow-up in 1 month with a chest x-ray.   Lajuana Matte 04/10/2022 2:23 PM

## 2022-04-21 ENCOUNTER — Other Ambulatory Visit: Payer: Self-pay

## 2022-04-21 MED ORDER — MONTELUKAST SODIUM 10 MG PO TABS: 10.0000 mg | ORAL_TABLET | Freq: Every day | ORAL | 6 refills | Status: DC

## 2022-04-22 ENCOUNTER — Encounter: Payer: Self-pay | Admitting: Family Medicine

## 2022-04-22 DIAGNOSIS — R0989 Other specified symptoms and signs involving the circulatory and respiratory systems: Secondary | ICD-10-CM

## 2022-04-23 ENCOUNTER — Other Ambulatory Visit (HOSPITAL_COMMUNITY): Payer: Self-pay

## 2022-04-27 DIAGNOSIS — E119 Type 2 diabetes mellitus without complications: Secondary | ICD-10-CM | POA: Diagnosis not present

## 2022-04-27 DIAGNOSIS — H5203 Hypermetropia, bilateral: Secondary | ICD-10-CM | POA: Diagnosis not present

## 2022-04-27 DIAGNOSIS — H1789 Other corneal scars and opacities: Secondary | ICD-10-CM | POA: Diagnosis not present

## 2022-04-27 DIAGNOSIS — H52203 Unspecified astigmatism, bilateral: Secondary | ICD-10-CM | POA: Diagnosis not present

## 2022-04-27 LAB — HM DIABETES EYE EXAM

## 2022-05-07 ENCOUNTER — Other Ambulatory Visit: Payer: Self-pay | Admitting: Thoracic Surgery (Cardiothoracic Vascular Surgery)

## 2022-05-07 DIAGNOSIS — K225 Diverticulum of esophagus, acquired: Secondary | ICD-10-CM

## 2022-05-08 ENCOUNTER — Ambulatory Visit (INDEPENDENT_AMBULATORY_CARE_PROVIDER_SITE_OTHER): Payer: Self-pay | Admitting: Thoracic Surgery (Cardiothoracic Vascular Surgery)

## 2022-05-08 ENCOUNTER — Ambulatory Visit
Admission: RE | Admit: 2022-05-08 | Discharge: 2022-05-08 | Disposition: A | Discharge status: Home / Self Care | Payer: HMO | Source: Ambulatory Visit | Attending: Thoracic Surgery (Cardiothoracic Vascular Surgery) | Admitting: Thoracic Surgery (Cardiothoracic Vascular Surgery)

## 2022-05-08 VITALS — BP 119/76 | HR 56 | Resp 20 | Ht 75.0 in | Wt 212.0 lb

## 2022-05-08 DIAGNOSIS — I517 Cardiomegaly: Secondary | ICD-10-CM | POA: Diagnosis not present

## 2022-05-08 DIAGNOSIS — Z8719 Personal history of other diseases of the digestive system: Secondary | ICD-10-CM | POA: Diagnosis not present

## 2022-05-08 DIAGNOSIS — Q396 Congenital diverticulum of esophagus: Secondary | ICD-10-CM

## 2022-05-08 DIAGNOSIS — K225 Diverticulum of esophagus, acquired: Secondary | ICD-10-CM

## 2022-05-08 DIAGNOSIS — Z09 Encounter for follow-up examination after completed treatment for conditions other than malignant neoplasm: Secondary | ICD-10-CM

## 2022-05-08 NOTE — Progress Notes (Signed)
      [address]       [place] [address]             [phone]        Rocket F [facility] Medical Record #[id number] Date of Birth: 09/21/1950  Referring: [name], MD Primary Care: [name], MD Primary Cardiologist:Dalton Aundra Dubin, MD  Reason for visit:   follow-up  History of Present Illness:     71 year old male presents for his 1 month follow-up appointment.  He does report improvement in his dysphagia.  He has had some episodes of reflux.  Physical Exam: BP 119/76 (BP Location: Right Arm, Patient Position: Sitting)   Pulse (!) 56   Resp 20   Ht '6\' 3"'$  (1.905 m)   Wt 212 lb (96.2 kg)   SpO2 96% Comment: RA  BMI 26.50 kg/m   Alert NAD Abdomen, ND No peripheral edema   Diagnostic Studies & Laboratory data: CXR: Clear     Assessment / Plan:   71 year old male status post robotic assisted diverticulectomy, and esophagomyotomy for a traction esophageal diverticulum.  His dysphagia is much improved.  He does continue to have some mild throat clearing however this is also improved.  He has a follow-up with ENT, and is also requested another referral for gastroenterology for more follow-up.  He will follow-up with me as needed.   Lajuana Matte 05/08/2022 5:59 PM

## 2022-05-17 ENCOUNTER — Encounter (HOSPITAL_COMMUNITY): Payer: Self-pay | Admitting: Cardiology

## 2022-05-18 ENCOUNTER — Encounter (HOSPITAL_COMMUNITY): Payer: Self-pay

## 2022-05-18 NOTE — Progress Notes (Unsigned)
Medication Samples have been provided to the patient.  Drug name: Eliquis       Strength: '5mg'$         Qty: 4  LOT: [id number]  Exp.Date: 05/25  Dosing instructions: take 1 tablet Twice daily   The patient has been instructed regarding the correct time, dose, and frequency of taking this medication, including desired effects and most common side effects.   Jerl Mina 9:57 AM 05/18/2022

## 2022-06-09 ENCOUNTER — Encounter: Payer: Self-pay | Admitting: Family Medicine

## 2022-06-19 ENCOUNTER — Encounter (HOSPITAL_COMMUNITY): Payer: Self-pay | Admitting: Cardiology

## 2022-06-19 ENCOUNTER — Ambulatory Visit (HOSPITAL_COMMUNITY)
Admission: RE | Admit: 2022-06-19 | Discharge: 2022-06-19 | Disposition: A | Discharge status: Home / Self Care | Payer: HMO | Source: Ambulatory Visit | Attending: Cardiology | Admitting: Cardiology

## 2022-06-19 VITALS — BP 112/60 | HR 58 | Wt 211.2 lb

## 2022-06-19 DIAGNOSIS — R0609 Other forms of dyspnea: Secondary | ICD-10-CM | POA: Insufficient documentation

## 2022-06-19 DIAGNOSIS — Z79899 Other long term (current) drug therapy: Secondary | ICD-10-CM | POA: Diagnosis not present

## 2022-06-19 DIAGNOSIS — I110 Hypertensive heart disease with heart failure: Secondary | ICD-10-CM | POA: Insufficient documentation

## 2022-06-19 DIAGNOSIS — Z7901 Long term (current) use of anticoagulants: Secondary | ICD-10-CM | POA: Diagnosis not present

## 2022-06-19 DIAGNOSIS — I4892 Unspecified atrial flutter: Secondary | ICD-10-CM | POA: Insufficient documentation

## 2022-06-19 DIAGNOSIS — Z7984 Long term (current) use of oral hypoglycemic drugs: Secondary | ICD-10-CM | POA: Diagnosis not present

## 2022-06-19 DIAGNOSIS — I959 Hypotension, unspecified: Secondary | ICD-10-CM | POA: Insufficient documentation

## 2022-06-19 DIAGNOSIS — I700 Atherosclerosis of aorta: Secondary | ICD-10-CM

## 2022-06-19 DIAGNOSIS — G4733 Obstructive sleep apnea (adult) (pediatric): Secondary | ICD-10-CM | POA: Insufficient documentation

## 2022-06-19 DIAGNOSIS — E119 Type 2 diabetes mellitus without complications: Secondary | ICD-10-CM | POA: Diagnosis not present

## 2022-06-19 DIAGNOSIS — I5022 Chronic systolic (congestive) heart failure: Secondary | ICD-10-CM | POA: Diagnosis not present

## 2022-06-19 DIAGNOSIS — E785 Hyperlipidemia, unspecified: Secondary | ICD-10-CM | POA: Diagnosis not present

## 2022-06-19 LAB — CBC
HCT: 43 % (ref 39.0–52.0)
Hemoglobin: 14.1 g/dL (ref 13.0–17.0)
MCH: 30 pg (ref 26.0–34.0)
MCHC: 32.8 g/dL (ref 30.0–36.0)
MCV: 91.5 fL (ref 80.0–100.0)
Platelets: 241 10*3/uL (ref 150–400)
RBC: 4.7 MIL/uL (ref 4.22–5.81)
RDW: 13.5 % (ref 11.5–15.5)
WBC: 7.1 10*3/uL (ref 4.0–10.5)
nRBC: 0 % (ref 0.0–0.2)

## 2022-06-19 LAB — BASIC METABOLIC PANEL
Anion gap: 9 (ref 5–15)
BUN: 17 mg/dL (ref 8–23)
CO2: 25 mmol/L (ref 22–32)
Calcium: 9.2 mg/dL (ref 8.9–10.3)
Chloride: 104 mmol/L (ref 98–111)
Creatinine, Ser: 0.81 mg/dL (ref 0.61–1.24)
GFR, Estimated: >60 mL/min (ref >60)
Glucose, Bld: 153 mg/dL — ABNORMAL HIGH (ref 70–99)
Potassium: 4 mmol/L (ref 3.5–5.1)
Sodium: 138 mmol/L (ref 135–145)

## 2022-06-19 LAB — LIPID PANEL
Cholesterol: 111 mg/dL (ref 0–200)
HDL: 39 mg/dL — ABNORMAL LOW (ref >40)
LDL Cholesterol: 50 mg/dL (ref 0–99)
Total CHOL/HDL Ratio: 2.8 RATIO
Triglycerides: 108 mg/dL (ref <150)
VLDL: 22 mg/dL (ref 0–40)

## 2022-06-19 NOTE — Patient Instructions (Addendum)
Good to see you today!   Labs done today, your results will be available in MyChart, we will contact you for abnormal readings.  Your physician has requested that you have an echocardiogram. Echocardiography is a painless test that uses sound waves to create images of your heart. It provides your doctor with information about the size and shape of your heart and how well your heart's chambers and valves are working. This procedure takes approximately one hour. There are no restrictions for this procedure. Please do NOT wear cologne, perfume, aftershave, or lotions (deodorant is allowed). Please arrive 15 minutes prior to your appointment time.  CT of chest ordered. Once insurance approves we will call to schedule the appointment  Your physician recommends that you schedule a follow-up appointment in: 6 months with echocardiogram( May 2024) Call in office in March 2024 to schedule an appointment     If you have any questions or concerns before your next appointment please send [place] a message through [place] or call our office at [phone].    TO LEAVE A MESSAGE FOR THE NURSE SELECT OPTION 2, PLEASE LEAVE A MESSAGE INCLUDING: YOUR NAME DATE OF BIRTH CALL BACK NUMBER REASON FOR CALL**this is important as we prioritize the call backs  YOU WILL RECEIVE A CALL BACK THE SAME DAY AS LONG AS YOU CALL BEFORE 4:00 PM At the [facility], you and your health needs are our priority. As part of our continuing mission to provide you with exceptional heart care, we have created designated Provider Care Teams. These Care Teams include your primary Cardiologist (physician) and Advanced Practice Providers (APPs- Physician Assistants and Nurse Practitioners) who all work together to provide you with the care you need, when you need it.   You may see any of the following providers on your designated Care Team at your next follow up: Dr Glori Bickers Dr Loralie Champagne Dr. Roxana Hires, NP Lyda Jester, [place] [facility] [place], [place] Forestine Na, NP Audry Riles, PharmD   Please be sure to bring in all your medications bottles to every appointment.  Do the following things EVERYDAY: Weigh yourself in the morning before breakfast. Write it down and keep it in a log. Take your medicines as prescribed Eat low salt foods--Limit salt (sodium) to 2000 mg per day.  Stay as active as you can everyday Limit all fluids for the day to less than 2 liters

## 2022-06-21 NOTE — Progress Notes (Signed)
Patient ID: [name] [name], male   DOB: 1951-06-04, 71 y.o.   MRN: [id number] PCP: Dr. Birdie Riddle Cardiology: Dr. Aundra Dubin  71 y.o. with history of aortic insufficiency s/p Ross procedure and aortic root aneurysm presents for followup of Ross procedure.  He has a left-sided brachial plexopathy of uncertain etiology and has atrophy of his left shoulder and upper arm.  He developed weakness in his right hand as well as occasional vertigo-type spells.  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    Echo in 8/18 showed no regurgitation or stenosis of autograft in aortic position, bioprosthetic pulmonic valve with mild pulmonic stenosis, mean gradient 10 mmHg.  EF was read as 40-45%, which is lower than in the past.  MRA chest 3/19 showed 5.7 cm dilation of pulmonary autograft at level of sinuses of Valsalva, MRA chest in 6/20 showed 6 cm dilation of the pulmonary autograft at the level of the sinuses of valsalva.  He is being followed for aortic root aneurysm by Dr. Servando Snare.   MRA chest in 6/21 showed aortic root 5.4 cm, no change from prior. Echo in 8/21 showed EF 50%, RV mildly enlarged with mildly decreased systolic function, pulmonary valve autograft in aortic position with mild regurgitation but no stenosis, bioprosthetic pulmonary valve with no significant stenosis or regurgitation, ascending aorta 5.3 cm.   Patient was noted to be in atrial flutter in 8/21, rate 110s.  Looking back, his HR was elevated in the 110s at physician appointments back to 5/21 (no ECGs).  I took him for TEE-guided DCCV in 8/21.  TEE showed EF 40%, mildly decreased RV function, aortic valve replaced by pulmonary autograft with mild AI and no AS, bioprosthetic PV looked  stable.  No LA appendage thrombus, he underwent DCCV.  After DCCV, he developed exertional dyspnea and was started on Lasix.   Echo in 10/21 showed EF improved to 55-60%, mild RV dilation with mildly decreased systolic function, bioprosthetic PV with peak gradient 31 mmHg (mild PS), pulmonary autograft in aortic position with mild regurgitation and no stenosis.  Sleep study showed severe OSA but he does not want to use CPAP.   MRA chest in 6/22 showed stable 5.4 cm aortic root.  Echo was done today and reviewed, EF 50%, mild LV dilation, mildly decreased RV systolic function, pulmonary autograft in aortic position with mild regurgitation and no stenosis, bioprosthetic pulmonary valve with no significant stenosis or regurgitation, PASP 28 mmHg.   Zio monitor (12/22) showed short SVT and NSVT, no significant arrhythmias.    Patient had robotic repair of esophageal diverticulum.   MRA chest in 7/23 showed 5.4 cm aortic root.   Patient returns for followup of CHF and PAF. He still has significant reflux that affects his vocal cords.  He is on Protonix.  He is in NSR today.  No palpitations.  No significant exertional dyspnea though he fatigues with yardwork. No chest pain.  No lightheadedness. Weight down 16 lbs, decreased po intake after esophageal diverticulum repair.   ECG (personally reviewed): NSR, RBBB, 1st degree AVB 296 msec  Labs (2/14): K 3.7, creatinine 0.9, LDL 63, HDL 29 Labs (3/14): BNP 36 Labs (2/15): K 3.7, creatinine 0.8, LDL 61, HDL 30 Labs (4/16): K  3.7, creatinine 0.8, LDL 51, HDL 34 Labs (5/16): HCT 44.9 Labs (11/17): LDL 74, HDL 40, K 4.1, creatinine 0.77 Labs (11/18): LDL 75, HDL 35 Labs (2/19): K 4.2, creatinine 0.88 Labs (5/20): LDL 60, K 4.7, creatinine 0.81 Labs (5/21): TSH normal, LDL 71, K 4.7, creatinine 0.78 Labs (8/21): K 4.3, creatinine 0.75 => 1.02 Labs (5/21): LDL 70 Labs (9/21): K 4, creatinine 0.88 Labs (12/21): LDL 110, K 4.1, creatinine 0.84 Labs  (2/22): K 4.8, creatinine 0.9 Labs (4/22): LDL 70, HDL 41 Labs (6/22): LDL 64, K 4.4, creatinine 0.76, hgb 14.6 Labs (12/22): K 4.3, creatinine 0.9 Labs (3/23): K 4.5, creatinine 0.78, LDL 72, TGs 97 Labs (9/23): LDL 49, K 4.3, creatinine 0.85  PMH: 1. Aortic insufficiency: s/p Ross procedure in 1994 in [place].   - Echo (4/11) with hypokinetic basal septum (likely post-surgical), EF 50%, mild LVH, s/p Ross procedure with native pulmonic valve in aortic position with mild aortic insufficiency and bioprosthetic pulmonic valve with no pulmonic insufficiency, mild MR, aortic upper normal in size.  - Echo (4/13) with EF 55%, mild LVH, mild AI, mild MR, mild RV dilation, bioprosthetic pulmonic valve with peak pressure 25 mmHg.   - Echo (3/14): Mild LV dilation, mild LVH, EF 60%, pulmonary valve in aortic position (s/p Ross) with mild AI and mildly dilated ascending aorta to 4.3 cm, mildly dilated RV with normal systolic function, bioprosthetic PVR with mean gradient 23 mmHg.  Echo (3/15) with EF 55-60%, mild LVH, mild AI with no AS, bioprosthetic pulmonic valve with peak gradient 31 mmHg, ascending aorta 4.4 cm.   - Cardiac MRI/MRA chest (5/15) with mild regurgitation of autograft aortic valve and mild regurgitation of homograft pulmonic valve, dilation of aortic root at sinuses of valsalva (4.8 cm), EF 60%, no LGE, mild RV dilation with normal RV systolic function.   - Echo (5/16) with EF 55-60%, mild autograft AI, mild MR, mildly decreased RV systolic function, biatrial enlargement, RVSP 35 mmHg, trivially increased gradient across bioprosthetic pulmonic valve (no progression).  - Echo (8/17): EF 55-60%, pulmonary autograft in aortic valve position, mild AI, bioprosthetic pulmonary valve with mild PS (peak 22 mmHg), moderate RV dilation with normal systolic function.  - Echo (8/18): EF 40-45%, mildly increased gradient across bioprosthetic pulmonary valve (mean 10 mmHg), s/p Ross procedure with no  stenosis or significant regurgitation of pulmonary autograft in aortic position.  - Echo (8/21): EF 50%, RV mildly enlarged with mildly decreased systolic function, pulmonary valve autograft in aortic position with mild regurgitation but no stenosis, bioprosthetic pulmonary valve with no significant stenosis or regurgitation, ascending aorta 5.3 cm.  - TEE (8/21): EF 40%, mildly decreased RV function, aortic valve replaced by pulmonary autograft with mild AI and no AS, bioprosthetic PV looked stable. - Echo (10/21): EF improved to 55-60%, mild RV dilation with mildly decreased systolic function, bioprosthetic PV with peak gradient 31 mmHg (mild PS), pulmonary autograft in aortic position with mild regurgitation and not stenosis.  - Echo (12/22): EF 50%, mild LV dilation, mildly decreased RV systolic function, pulmonary autograft in aortic position with mild regurgitation and no stenosis, bioprosthetic pulmonary valve with no significant stenosis or regurgitation, PASP 28 mmHg. 2. Post-operative atrial fibrillation after heart surgery.  3. Normal left heart cath prior to 1994 heart surgery.  4. Left brachial plexopathy with left shoulder muscle atrophy.  Uncertain etiology.  5. BPH 6. Hyperlipidemia.  7. Cervical spinal stenosis with radiculopathy.   8. 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  9. ETT-Sestamibi (3/14): No ischemia or infarction.  10. Carotid dopplers (6/14): mild disease only.  11. Sinus of valsalva aneurysm: MRA chest 2/18 with dilation of pulmonary autograft at level of sinuses of Valsalva to 5.4 cm when measured into left cusp.  - MRA chest (3/19): Saccular dilatation of the left and right sinuses of valsalva, 5.7 cm aortic root diameter.   - MRA chest (6/20): 6 cm dilated sinuses of valsalva, 4.6 cm ascending aorta.  - MRA chest (6/21): aortic root 5.4 cm, no change from prior - MRA chest (6/22): aortic root  5.4 cm.  - MRA chest (7/23): aortic root 5.4 cm 12. Elevated left hemidiaphragm.  13. Atrial flutter: Atypical.  Noted in 8/21 => underwent DCCV to NSR.  14. Presyncope: Zio monitor 12/22 with short SVT and NSVT runs, no significant arrhythmias. 15. Esophageal diverticulum: s/p repair  FH: Prostate CA, HTN  SH: Lives in [place], owns a window washing business, married, nonsmoker.   ROS: All systems reviewed and negative except as per HPI.    Current Outpatient Medications  Medication Sig Dispense Refill   apixaban (ELIQUIS) 5 MG TABS tablet Take 1 tablet (5 mg total) by mouth 2 (two) times daily. 180 tablet 3   azelastine (ASTELIN) 0.1 % nasal spray Place 2 sprays into both nostrils 2 (two) times daily. 30 mL 12   cetirizine (ZYRTEC) 10 MG tablet TAKE 1 TABLET(10 MG) BY MOUTH DAILY 90 tablet 0   empagliflozin (JARDIANCE) 10 MG TABS tablet Take 1 tablet by mouth daily before breakfast. 90 tablet 3   famotidine (PEPCID) 40 MG tablet Take 40 mg by mouth 2 (two) times daily.     finasteride (PROSCAR) 5 MG tablet TAKE 1 TABLET(5 MG) BY MOUTH DAILY (Patient taking differently: Take 5 mg by mouth daily.) 90 tablet 1   fluticasone (FLONASE) 50 MCG/ACT nasal spray Place 2 sprays into both nostrils as needed for allergies or rhinitis. 50 mL 30   furosemide (LASIX) 20 MG tablet TAKE 1 TABLET(20 MG) BY MOUTH EVERY OTHER DAY 15 tablet 11   loperamide (IMODIUM) 2 MG capsule Take 2 mg by mouth daily as needed for diarrhea or loose stools.     losartan (COZAAR) 25 MG tablet Take 0.5 tablets (12.5 mg total) by mouth daily. 45 tablet 3   metoprolol succinate (TOPROL-XL) 100 MG 24 hr tablet Take 1 tablet (100 mg total) by mouth 2 (two) times daily. 180 tablet 0   montelukast (SINGULAIR) 10 MG tablet Take 1 tablet (10 mg total) by mouth at bedtime. 30 tablet 6   pantoprazole (PROTONIX) 40 MG tablet Take 1 tablet (40 mg total) by mouth 2 (two) times daily. 180 tablet 1   rosuvastatin (CRESTOR) 40 MG tablet  TAKE 1 TABLET(40 MG) BY MOUTH DAILY 90 tablet 3   spironolactone (ALDACTONE) 25 MG tablet TAKE 1 TABLET(25 MG) BY MOUTH DAILY 90 tablet 3   No current facility-administered medications for this encounter.    BP 112/60   Pulse (!) 58   Wt 95.8 kg (211 lb 3.2 oz)   SpO2 99%   BMI 26.40 kg/m  General: NAD Neck: No JVD, no thyromegaly or thyroid nodule.  Lungs: Clear to auscultation bilaterally with normal respiratory effort. CV: Nondisplaced PMI.  Heart regular S1/S2 with wide split S2, no S3/S4, 1/6 SEM RUSB.  No peripheral edema.  No carotid bruit.  Normal pedal pulses.  Abdomen: Soft,  nontender, no hepatosplenomegaly, no distention.  Skin: Intact without lesions or rashes.  Neurologic: Alert and oriented x 3.  Psych: Normal affect. Extremities: No clubbing or cyanosis.  HEENT: Normal.   Assessment/Plan: 1. Atrial flutter:  Likely related to prior cardiac surgery.  He had peri-operative AF at time of heart surgery but had not been noted to have recurrent arrhythmias until 8/21.  In 8/21, he was noted to be in atrial flutter with RVR, based on review of HR over the past few months, he had probably been in atrial flutter since at least 5/21. He had DCCV in 8/21 and is in NSR today. He saw Dr. Rayann Heman, ablation offered if flutter recurs.  - Continue Eliquis 5 mg bid. CBC today.  - Continue Toprol XL 100 mg bid.  2.  History of Ross Procedure: Echo in 12/22 showed pulmonary autograft in aortic position with mild AI, no AS.  The bioprosthetic pulmonary valve appeared normal.   - Needs endocarditis prophylaxis with dental work.   - Echo at followup appt.  3.  Aortic root aneurym: This not uncommonly accompanies Ross procedure.  The pulmonary autograft at sinuses of valsalva measured 5.4 cm by MRA chest in 7/23.  Followed by [facility]. - Arrange for CTA chest in about 6 months.  - Goal SBP 120s or lower.  4. HTN: Given aneurysm, need to make sure that BP is well-controlled. We have had to cut back  anti-hypertensives with low BP.  - Continue losartan 12.5 daily.  - Continue spironolactone, Toprol XL.   5. Hyperlipidemia: Continue current Crestor, check lipids today.  6. Chronic systolic CHF: TEE in 5/63 in setting of long-standing atrial flutter with RVR showed EF down to 40%. 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  He was cardioverted in 8/21 but developed exertional dyspnea with volume overload afterwards.  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  Echo in 12/22 showed EF 50% with mild RV dysfunction. He is not volume overloaded on exam, NYHA class I-II symptoms now.     - Continue Lasix 20 mg qod. BMET today.  - Continue Toprol XL.  - Continue losartan 12.5 mg daily.   - Continue spironolactone 25 mg daily. - Continue Jardiance 10 mg daily.  - Echo at next appt.  7. OSA: Severe by sleep study.  Refuses CPAP.  I suspect this is a major contributor to his daytime sleepiness and fatigue.  8. Diabetes: on metformin and Jardiance.   Followup in 6 months with echo  Loralie Champagne 06/21/2022

## 2022-06-28 ENCOUNTER — Other Ambulatory Visit (HOSPITAL_COMMUNITY): Payer: Self-pay | Admitting: Cardiology

## 2022-06-30 ENCOUNTER — Telehealth (HOSPITAL_COMMUNITY): Payer: Self-pay | Admitting: Vascular Surgery

## 2022-06-30 NOTE — Telephone Encounter (Signed)
Left detailed VM giving pt CT appt 12/5 @ 8am, arr @ 745, npo after midnight, asked pt to cal back to confirm

## 2022-07-06 ENCOUNTER — Encounter: Payer: Self-pay | Admitting: Family Medicine

## 2022-07-06 NOTE — Telephone Encounter (Signed)
Should this patient get the RSV vaccine?

## 2022-07-07 ENCOUNTER — Ambulatory Visit (HOSPITAL_COMMUNITY)
Admission: RE | Admit: 2022-07-07 | Discharge: 2022-07-07 | Disposition: A | Discharge status: Home / Self Care | Payer: HMO | Source: Ambulatory Visit | Attending: Cardiology | Admitting: Cardiology

## 2022-07-07 DIAGNOSIS — I700 Atherosclerosis of aorta: Secondary | ICD-10-CM | POA: Diagnosis present

## 2022-07-07 MED ORDER — IOHEXOL 350 MG/ML SOLN
75.0000 mL | Freq: Once | INTRAVENOUS | Status: AC | PRN
  Administered 2022-07-07: 75 mL via INTRAVENOUS

## 2022-07-08 ENCOUNTER — Encounter (HOSPITAL_COMMUNITY): Payer: Self-pay | Admitting: *Deleted

## 2022-07-08 NOTE — Telephone Encounter (Signed)
Spoke with pt and forwarded CT results to [place]

## 2022-07-13 ENCOUNTER — Ambulatory Visit (INDEPENDENT_AMBULATORY_CARE_PROVIDER_SITE_OTHER): Payer: HMO | Admitting: Family Medicine

## 2022-07-13 ENCOUNTER — Encounter: Payer: Self-pay | Admitting: Family Medicine

## 2022-07-13 VITALS — BP 126/80 | HR 56 | Temp 97.6°F | Resp 17 | Ht 75.0 in | Wt 213.5 lb

## 2022-07-13 DIAGNOSIS — J309 Allergic rhinitis, unspecified: Secondary | ICD-10-CM | POA: Diagnosis not present

## 2022-07-13 DIAGNOSIS — E118 Type 2 diabetes mellitus with unspecified complications: Secondary | ICD-10-CM

## 2022-07-13 LAB — HEMOGLOBIN A1C: Hgb A1c MFr Bld: 7.5 % — ABNORMAL HIGH (ref 4.6–6.5)

## 2022-07-13 NOTE — Progress Notes (Signed)
   Subjective:    Patient ID: [name] [name], male    DOB: 06-Jun-1951, 71 y.o.   MRN: [id number]  HPI DM- chronic problem, on Jardiance '10mg'$  daily.  On ARB for renal protection and UTD on microalbumin.  UTD on eye exam, foot exam.  Pt has gained 4 lbs since 12/4.  Last A1C 7.1%  Had recent CMP, lipids- WNL.  Denies CP, SOB, HA's visual changes, abd pain, N/V.  Denies symptomatic lows.  No numbness/tingling of feet.  Allergic rhinitis- pt has seen ENT who told him that throat clearing and swallowing issues were due to GERD.  GI says no- they think it's more allergy related.  Asking for referral to allergist.     Review of Systems For ROS see HPI     Objective:   Physical Exam Vitals reviewed.  Constitutional:      General: He is not in acute distress.    Appearance: Normal appearance. He is well-developed. He is not ill-appearing.  HENT:     Head: Normocephalic and atraumatic.  Eyes:     Extraocular Movements: Extraocular movements intact.     Conjunctiva/sclera: Conjunctivae normal.     Pupils: Pupils are equal, round, and reactive to light.  Neck:     Thyroid: No thyromegaly.  Cardiovascular:     Rate and Rhythm: Normal rate and regular rhythm.     Pulses: Normal pulses.     Heart sounds: Murmur heard.  Pulmonary:     Effort: Pulmonary effort is normal. No respiratory distress.     Breath sounds: Normal breath sounds.  Abdominal:     General: Bowel sounds are normal. There is no distension.     Palpations: Abdomen is soft.  Musculoskeletal:     Cervical back: Normal range of motion and neck supple.     Right lower leg: No edema.     Left lower leg: No edema.  Lymphadenopathy:     Cervical: No cervical adenopathy.  Skin:    General: Skin is warm and dry.  Neurological:     General: No focal deficit present.     Mental Status: He is alert and oriented to person, place, and time.     Cranial Nerves: No cranial nerve deficit.  Psychiatric:        Mood and Affect: Mood  normal.        Behavior: Behavior normal.           Assessment & Plan:

## 2022-07-13 NOTE — Patient Instructions (Addendum)
Schedule your complete physical in 3-4 months We'll notify you of your lab results and make any changes if needed Keep up the good work on healthy diet and regular exercise- you look great! We'll call you to schedule your Allergy appt Call with any questions or concerns Stay safe!  Stay healthy! Happy Birthday!! Happy Holidays!!!

## 2022-07-13 NOTE — Assessment & Plan Note (Signed)
Chronic problem.  UTD on eye exam, foot exam, microalbumin.  Tolerating Jardiance w/o difficulty.  On ARB for renal protection.  Check labs.  Adjust meds prn

## 2022-07-13 NOTE — Assessment & Plan Note (Signed)
Ongoing issue despite current medications.  Refer to allergy for complete evaluation

## 2022-07-14 ENCOUNTER — Telehealth: Payer: Self-pay

## 2022-07-14 NOTE — Telephone Encounter (Signed)
-----   Message from [name], MD sent at 07/14/2022  7:40 AM EST ----- Your A1C has increased from 7.1 --> 7.5%  This is likely due to holiday eating.  Please try and focus on a low carb/low sugar diet and we'll continue to monitor in case we need to make med changes in the future

## 2022-07-14 NOTE — Telephone Encounter (Signed)
Informed pt of lab results  

## 2022-07-21 ENCOUNTER — Encounter: Payer: Self-pay | Admitting: Internal Medicine

## 2022-07-21 ENCOUNTER — Other Ambulatory Visit (HOSPITAL_COMMUNITY): Payer: Self-pay

## 2022-07-21 ENCOUNTER — Ambulatory Visit: Payer: PPO | Admitting: Internal Medicine

## 2022-07-21 VITALS — BP 108/62 | HR 65 | Temp 97.7°F | Resp 20

## 2022-07-21 DIAGNOSIS — G4733 Obstructive sleep apnea (adult) (pediatric): Secondary | ICD-10-CM | POA: Diagnosis not present

## 2022-07-21 DIAGNOSIS — J3089 Other allergic rhinitis: Secondary | ICD-10-CM | POA: Diagnosis not present

## 2022-07-21 DIAGNOSIS — K219 Gastro-esophageal reflux disease without esophagitis: Secondary | ICD-10-CM | POA: Diagnosis not present

## 2022-07-21 DIAGNOSIS — R053 Chronic cough: Secondary | ICD-10-CM

## 2022-07-21 MED ORDER — IPRATROPIUM BROMIDE 0.06 % NA SOLN: 2.0000 | Freq: Four times a day (QID) | NASAL | 12 refills | Status: DC

## 2022-07-21 NOTE — Patient Instructions (Addendum)
Etiology of chronic cough is broad. Common considerations include asthma, COPD, allergic rhinitis, nonallergic rhinitis, infections, reflux (GERD/LPR), neurogenic and/or habitual cough.  Mainstay of treatment is to control all possible triggers and address the cough hypersensitivity aspect.   The history and physical examination suggest this cough is multifactorial and potentially attributed to  rhinitis , GERD, and neurogenic.  We will address  Rhinitis , GERD, and neurogenic cough at this time.   PLAN: - allergy testing today was positive to dust mite  - allergen avoidance as below - Continue Nasal Steroid Spray: Options include Flonase (fluticasone), Nasocort (triamcinolone), Nasonex (mometasome) 1- 2 sprays in each nostril daily (can buy over-the-counter if not covered by insurance)  Best results if used daily. - Continue Astelin (Azelastine) 1-2 sprays in each nostril twice a day as needed.  You may use this as needed for nasal congestion/itchy ears/itchy nose if desired - Start Atrovent (Ipratropium Bromide) 1-2 sprays in each nostril up to 3 times a day as needed for runny nose/post nasal drip/drainage.  Use less frequently if airway gets too dry. - Continue Singulair (Montelukast) '10mg'$  nightly. - Continue over the counter antihistamine daily or daily as needed.   - Sample given for xyzal which is the one I recommend   GERD - continue dietary and lifestyle modifications  - Continue pantoprazole as prescribed   Follow up: 2 months   Thank you so much for letting me partake in your care today.  Don't hesitate to reach out if you have any additional concerns!  Roney Marion, MD  [facility] [place] [place]  DUST MITE AVOIDANCE MEASURES:  There are three main measures that need and can be taken to avoid house dust mites:  Reduce accumulation of dust in general -reduce furniture, clothing, carpeting, books, stuffed animals, especially in bedroom  Separate yourself  from the dust -use pillow and mattress encasements (can be found at stores such as [employer], and [employer] or online) -avoid direct exposure to air condition flow -use a HEPA filter device, especially in the bedroom; you can also use a HEPA filter vacuum cleaner -wipe dust with a moist towel instead of a dry towel or broom when cleaning  Decrease mites and/or their secretions -wash clothing and linen and stuffed animals at highest temperature possible, at least every 2 weeks -stuffed animals can also be placed in a bag and put in a freezer overnight  Despite the above measures, it is impossible to eliminate dust mites or their allergen completely from your home.  With the above measures the burden of mites in your home can be diminished, with the goal of minimizing your allergic symptoms.  Success will be reached only when implementing and using all means together.

## 2022-07-21 NOTE — Progress Notes (Unsigned)
New Patient Note  RE: [name] [name] MRN: [id number] DOB: 1951-06-03 Date of Office Visit: 07/21/2022  Consult requested by: [name], MD Primary care provider: [name], MD  Chief Complaint: Allergies (Clearing of the throat episodes for a long period and march and April worsened. Food triggers. )  History of Present Illness: I had the pleasure of seeing [name] [name] for initial evaluation at the [facility] on 07/22/2022. He is a 71 y.o. male, who is referred here by [name], MD for the evaluation of cough .  History obtained from patient, chart review and  wife , Juliann Pulse .  Cough: Initially started from a tongue adulthood, worsened over the 6-7 months, now occurring daily .  Associates:  coughing spells lasting up to 45 minutes, worse in the evening, does not occur at night post nasal drainage, reflux Trial of OCS: denies  Trial of Inhalers: denies  History of Reflux: Yes, on pantoprazole History of post nasal drainage: Yes  Triggers:  eating makes it worse  Therapies tried: cough drops work the best  Taking an ACE-I or ARB: Yes on losartan (started after cough symptoms started)   Up-to-date with pneumonia Covid-19 Flu vaccines. History of prior pneumonias: denies  History of prior COVID-19 infection: denies  Smoking history/exposure: denies   ENT 06/09/22: "Impression & Plans: Normal head and neck examination. His symptoms and his history are consistent with reflux. We discussed the nature of reflux and the causes. Recommend complete elimination of all caffeine, alcohol and chocolate.We discussed causes of reflux, including lifestyle and dietary factors. Recommend strict avoidance of all tobacco, caffeine, alcohol, chocolate and peppermint. A reflux handout with more detailed instructions was provided to the patient. If not better in a few months contact me for additional recommendations."  Chronic rhinitis: started a few  years  Symptoms include: rhinorrhea and sneezing  Occurs seasonally-spring and fall  Potential triggers: denies animal triggers  Treatments tried: zyrtec and flonase  Previous allergy testing: yes (as a child) was on allergy injections  History of reflux/heartburn: yes History of chronic sinusitis or sinus surgery: no Nonallergic triggers:  denies       Assessment and Plan: [name] [name] is a 71 y.o. male with: Refractory chronic cough - Plan: Allergy Test, Interdermal Allergy Test  Other allergic rhinitis - Plan: Allergy Test, Interdermal Allergy Test  Laryngopharyngeal reflux (LPR) Plan: Patient Instructions  Etiology of chronic cough is broad. Common considerations include asthma, COPD, allergic rhinitis, nonallergic rhinitis, infections, reflux (GERD/LPR), neurogenic and/or habitual cough.  Mainstay of treatment is to control all possible triggers and address the cough hypersensitivity aspect.   The history and physical examination suggest this cough is multifactorial and potentially attributed to  rhinitis , GERD, and neurogenic.  We will address  Rhinitis , GERD, and neurogenic cough at this time.   PLAN: - allergy testing today was positive to dust mite  - allergen avoidance as below - Continue Nasal Steroid Spray: Options include Flonase (fluticasone), Nasocort (triamcinolone), Nasonex (mometasome) 1- 2 sprays in each nostril daily (can buy over-the-counter if not covered by insurance)  Best results if used daily. - Continue Astelin (Azelastine) 1-2 sprays in each nostril twice a day as needed.  You may use this as needed for nasal congestion/itchy ears/itchy nose if desired - Start Atrovent (Ipratropium Bromide) 1-2 sprays in each nostril up to 3 times a day as needed for runny nose/post nasal drip/drainage.  Use less frequently if airway  gets too dry. - Continue Singulair (Montelukast) '10mg'$  nightly. - Continue over the counter antihistamine daily or daily as needed.   - Sample  given for xyzal which is the one I recommend   GERD - continue dietary and lifestyle modifications  - Continue pantoprazole as prescribed   Follow up: 2 months   Thank you so much for letting me partake in your care today.  Don't hesitate to reach out if you have any additional concerns!  Roney Marion, MD  [facility] [place] [place]  DUST MITE AVOIDANCE MEASURES:  There are three main measures that need and can be taken to avoid house dust mites:  Reduce accumulation of dust in general -reduce furniture, clothing, carpeting, books, stuffed animals, especially in bedroom  Separate yourself from the dust -use pillow and mattress encasements (can be found at stores such as Bed, [employer], [employer] or online) -avoid direct exposure to air condition flow -use a HEPA filter device, especially in the bedroom; you can also use a HEPA filter vacuum cleaner -wipe dust with a moist towel instead of a dry towel or broom when cleaning  Decrease mites and/or their secretions -wash clothing and linen and stuffed animals at highest temperature possible, at least every 2 weeks -stuffed animals can also be placed in a bag and put in a freezer overnight  Despite the above measures, it is impossible to eliminate dust mites or their allergen completely from your home.  With the above measures the burden of mites in your home can be diminished, with the goal of minimizing your allergic symptoms.  Success will be reached only when implementing and using all means together.   Meds ordered this encounter  Medications   ipratropium (ATROVENT) 0.06 % nasal spray    Sig: Place 2 sprays into both nostrils 4 (four) times daily.    Dispense:  15 mL    Refill:  12   Lab Orders  No laboratory test(s) ordered today    Other allergy screening: Asthma: no Rhino conjunctivitis: yes Food allergy:  history of childhood food allergies Medication allergy:  Penicillin Hymenoptera allergy:  no Urticaria: no Eczema:no History of recurrent infections suggestive of immunodeficency: no  Diagnostics: Skin Testing: Environmental allergy panel. Skin prick positive to dust mite only, intradermals were negative  Results interpreted by myself and discussed with patient/family.  Airborne Adult Perc - 07/21/22 1527     Time Antigen Placed 1527    Allergen Manufacturer Lavella Hammock    Location Back    Number of Test 58    1. Control-Buffer 50% Glycerol Negative    2. Control-Histamine 1 mg/ml 4+    3. Albumin saline Negative    4. [place] Negative    5. [place] Negative    6. Johnson Negative    7. [place] Blue Negative    8. Meadow Fescue Negative    9. Perennial Rye Negative    10. Sweet Vernal Negative    11. Timothy Negative    12. Cocklebur Negative    13. Burweed Marshelder Negative    14. Ragweed, short Negative    15. Ragweed, Giant Negative    16. Plantain,  English Negative    17. Lamb's Quarters Negative    18. Sheep Sorrell Negative    19. Rough Pigweed Negative    20. Marsh Elder, Rough Negative    21. Mugwort, Common Negative    22. Ash mix Negative    23. Wendee Copp mix Negative  24. Beech American Negative    25. Box, Elder Negative    26. Cedar, red Negative    27. Cottonwood, [place] Negative    28. Elm mix Negative    29. Hickory Negative    30. Maple mix Negative    31. Oak, [place] mix Negative    32. Pecan Pollen Negative    33. Pine mix Negative    34. Sycamore Eastern Negative    35. Berwyn, Black Pollen Negative    36. Alternaria alternata Negative    37. Cladosporium Herbarum Negative    38. Aspergillus mix Negative    39. Penicillium mix Negative    40. Bipolaris sorokiniana (Helminthosporium) Negative    41. Drechslera spicifera (Curvularia) Negative    42. Mucor plumbeus Negative    43. Fusarium moniliforme Negative    44. Aureobasidium pullulans (pullulara) Negative    45. Rhizopus oryzae Negative    46. Botrytis cinera Negative    47.  Epicoccum nigrum Negative    48. Phoma betae Negative    49. Candida Albicans Negative    50. Trichophyton mentagrophytes Negative    51. Mite, D Farinae  5,000 AU/ml 4+    52. Mite, D Pteronyssinus  5,000 AU/ml 4+    53. Cat Hair 10,000 BAU/ml Negative    54.  Dog Epithelia Negative    55. Mixed Feathers Negative    56. Horse Epithelia Negative    57. Cockroach, German Negative    58. Mouse Negative    59. Tobacco Leaf Negative             Intradermal - 07/21/22 1612     Time Antigen Placed 1612    Allergen Manufacturer Other    Location Arm    Number of Test 14    Control Negative    Guatemala Negative    Johnson Negative    7 Grass Negative    Ragweed mix Negative    Weed mix Negative    Tree mix Negative    Mold 1 Negative    Mold 2 Negative    Mold 3 Negative    Mold 4 Negative    Cat Negative    Dog Negative    Cockroach Negative             Past Medical History: Patient Active Problem List   Diagnosis Date Noted   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   Aortic atherosclerosis ([place]) 01/19/2022   Allergic rhinitis 12/31/2021   Degenerative disc disease, lumbar 04/04/2021   Abdominal bloating 12/14/2019   Controlled diabetes mellitus type 2 with complications ([place]) 01/27/9484   Headache syndrome 10/18/2018   Laryngopharyngeal reflux (LPR) 08/10/2016   BPH (benign prostatic hyperplasia) 06/03/2015   H/O Ross procedure 01/31/2015   Thoracic aortic aneurysm ([place]) 12/11/2013   Ganglion cyst 06/01/2013   Brachial plexus neuropathy 10/07/2011   Cervical disc disease 10/07/2011   General medical examination 03/11/2011   Carpopedal spasm 02/25/2011   Arthritis of hand 02/25/2011   CARPAL TUNNEL SYNDROME, BILATERAL 06/10/2009   AORTIC VALVE REPLACEMENT, HX OF 06/10/2009   Hyperlipidemia 08/08/2008   Essential hypertension 08/08/2008   ESOPHAGEAL STRICTURE 03/29/2003   GERD 03/29/2003   HIATAL HERNIA 10/21/2000   Past Medical History:  Diagnosis Date    Acute gastritis    Aortic insufficiency    s/p Ross Procedure: AVR/Pulmonary autograft in aortic position & PVR with bioprosthetic PV 1994 in [place]   Aortic root aneurysm ([place])    Atrial flutter ([place])  history- no atrial flutter since cardioversion   CHF (congestive heart failure) ([facility])    Diabetes mellitus without complication ([place])    type 2   Early satiety    Esophageal stricture    GERD (gastroesophageal reflux disease)    Headache syndrome 10/18/2018   Hiatal hernia    Hyperlipidemia    Neuropathy    left brachial plexus   Pain, dental    Sleep apnea    09/12/20 AHI 35.2/hr. Does not use CPAP. (03/26/22)   Past Surgical History: Past Surgical History:  Procedure Laterality Date   AORTIC VALVE REPLACEMENT  1994   CARDIOVERSION N/A 03/14/2020   Procedure: CARDIOVERSION;  Surgeon: [name], MD;  Location: [facility] ENDOSCOPY;  Service: Cardiovascular;  Laterality: N/A;   ESOPHAGOGASTRODUODENOSCOPY N/A 03/30/2022   Procedure: ESOPHAGOGASTRODUODENOSCOPY (EGD);  Surgeon: Lajuana Matte, MD;  Location: [facility];  Service: Thoracic;  Laterality: N/A;   GANGLION CYST EXCISION  JAN 2002   HIATAL HERNIA REPAIR  DEC 2002   INTERCOSTAL NERVE BLOCK Right 03/30/2022   Procedure: INTERCOSTAL NERVE BLOCK;  Surgeon: Lajuana Matte, MD;  Location: [facility];  Service: Thoracic;  Laterality: Right;   TEE WITHOUT CARDIOVERSION N/A 03/14/2020   Procedure: TRANSESOPHAGEAL ECHOCARDIOGRAM (TEE);  Surgeon: [name], MD;  Location: [facility] ENDOSCOPY;  Service: Cardiovascular;  Laterality: N/A;   TONSILLECTOMY     VASECTOMY  DEC 2002   Medication List:  Current Outpatient Medications  Medication Sig Dispense Refill   fluticasone (FLONASE) 50 MCG/ACT nasal spray Place 2 sprays into both nostrils as needed for allergies or rhinitis. 50 mL 30   ipratropium (ATROVENT) 0.06 % nasal spray Place 2 sprays into both nostrils 4 (four) times daily. 15 mL 12   apixaban (ELIQUIS) 5 MG TABS tablet  Take 1 tablet (5 mg total) by mouth 2 (two) times daily. 180 tablet 3   azelastine (ASTELIN) 0.1 % nasal spray Place 2 sprays into both nostrils 2 (two) times daily. 30 mL 12   cetirizine (ZYRTEC) 10 MG tablet TAKE 1 TABLET(10 MG) BY MOUTH DAILY 90 tablet 0   empagliflozin (JARDIANCE) 10 MG TABS tablet Take 1 tablet by mouth daily before breakfast. 90 tablet 3   famotidine (PEPCID) 40 MG tablet Take 40 mg by mouth 2 (two) times daily.     finasteride (PROSCAR) 5 MG tablet TAKE 1 TABLET(5 MG) BY MOUTH DAILY (Patient taking differently: Take 5 mg by mouth daily.) 90 tablet 1   furosemide (LASIX) 20 MG tablet TAKE 1 TABLET(20 MG) BY MOUTH EVERY OTHER DAY 15 tablet 11   losartan (COZAAR) 25 MG tablet Take 0.5 tablets (12.5 mg total) by mouth daily. 45 tablet 3   metoprolol succinate (TOPROL-XL) 100 MG 24 hr tablet TAKE 1 TABLET(100 MG) BY MOUTH TWICE DAILY 180 tablet 0   montelukast (SINGULAIR) 10 MG tablet Take 1 tablet (10 mg total) by mouth at bedtime. 30 tablet 6   pantoprazole (PROTONIX) 40 MG tablet Take 1 tablet (40 mg total) by mouth 2 (two) times daily. 180 tablet 1   rosuvastatin (CRESTOR) 40 MG tablet TAKE 1 TABLET(40 MG) BY MOUTH DAILY 90 tablet 3   spironolactone (ALDACTONE) 25 MG tablet TAKE 1 TABLET(25 MG) BY MOUTH DAILY 90 tablet 3   No current facility-administered medications for this visit.   Allergies: Allergies  Allergen Reactions   Penicillins Hives and Swelling   Quinidine Other (See Comments)    Increase heart beat per patient   Social History: Social History  Socioeconomic History   Marital status: Married    Spouse name: Belenda Cruise    Number of children: 3   Years of education: Not on file   Highest education level: Master's degree (e.g., MA, MS, MEng, MEd, MSW, MBA)  Occupational History   Occupation: retired  Tobacco Use   Smoking status: Never    Passive exposure: Current (wife smokes in car)   Smokeless tobacco: Never  Vaping Use   Vaping Use: Never  used  Substance and Sexual Activity   Alcohol use: No   Drug use: No   Sexual activity: Not on file  Other Topics Concern   Not on file  Social History Narrative   Caffeine 1-2 cups daily    Right handed   Live in [place] with spouse Belenda Cruise    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   Social Determinants of Health   Financial Resource Strain: Low Risk  (07/17/2021)   Overall Financial Resource Strain (CARDIA)    Difficulty of Paying Living Expenses: Not hard at all  Food Insecurity: No Food Insecurity (07/17/2021)   Hunger Vital Sign    Worried About Running Out of Food in the Last Year: Never true    [place] in the Last Year: Never true  Transportation Needs: No Transportation Needs (07/17/2021)   PRAPARE - Hydrologist (Medical): No    Lack of Transportation (Non-Medical): No  Physical Activity: Insufficiently Active (07/17/2021)   Exercise Vital Sign    Days of Exercise per Week: 4 days    Minutes of Exercise per Session: 30 min  Stress: No Stress Concern Present (07/17/2021)   [place]    Feeling of Stress : Not at all  Social Connections: Moderately Integrated (07/17/2021)   Social Connection and Isolation Panel [NHANES]    Frequency of Communication with Friends and Family: Twice a week    Frequency of Social Gatherings with Friends and Family: Twice a week    Attends Religious Services: More than 4 times per year    Active Member of [employer] or Organizations: No    Attends Archivist Meetings: Never    Marital Status: Married   Lives in a single-family home that is 70 years old.  There are no roaches in the house but is 2 feet on the floor.  There are no dust mite precautions.  He is not exposed to fumes, chemicals or dust.  He is exposed to sawdust in his workshop.  There is no HEPA filter in the home and home is not near an interstate  industrial area. Smoking: He is not an active smoker, but he is exposed to cigarette smoke in the car Occupation: Retired  Programme researcher, broadcasting/film/video History: Environmental education officer in the house: no Charity fundraiser in the family room: no Carpet in the bedroom: yes Heating: gas Cooling: central Pet: no  Family History: Family History  Problem Relation Age of Onset   Lung cancer Father        hx of smoking    Leukemia Brother        Passed at age of 100   Hypertension Other    Prostate cancer Other    Colon cancer Neg Hx    Pancreatic cancer Neg Hx    Rectal cancer Neg Hx    Stomach cancer Neg Hx    Asthma Neg Hx    Immunodeficiency Neg Hx    Eczema  Neg Hx    Atopy Neg Hx    Urticaria Neg Hx    Allergic rhinitis Neg Hx    Angioedema Neg Hx      ROS: All others negative except as noted per HPI.   Objective: BP 108/62   Pulse 65   Temp 97.7 F (36.5 C) (Temporal)   Resp 20   SpO2 96%  There is no height or weight on file to calculate BMI.  General Appearance:  Alert, cooperative, no distress, appears stated age  Head:  Normocephalic, without obvious abnormality, atraumatic  Eyes:  Conjunctiva clear, EOM's intact  Nose: Nares normal,  erythematous nasal mucosa , no visible anterior polyps, and septum midline  Throat: Lips, tongue normal; teeth and gums normal, no tonsillar exudate and + cobblestoning  Neck: Supple, symmetrical  Lungs:   clear to auscultation bilaterally, Respirations unlabored, no coughing  Heart:  Split S2, 1 out of 6 systolic ejection murmur and regular rate and rhythm, Appears well perfused  Extremities: No edema  Skin: Skin color, texture, turgor normal, no rashes or lesions on visualized portions of skin  Neurologic: No gross deficits   The plan was reviewed with the patient/family, and all questions/concerned were addressed.  It was my pleasure to see Jameison today and participate in his care. Please feel free to contact me with any questions or  concerns.  Sincerely,  Roney Marion, MD Allergy & Immunology  [facility] office: [phone] [facility] office: [phone]

## 2022-07-22 ENCOUNTER — Ambulatory Visit (HOSPITAL_COMMUNITY): Admit: 2022-07-22 | Payer: HMO | Admitting: Gastroenterology

## 2022-07-22 DIAGNOSIS — R053 Chronic cough: Secondary | ICD-10-CM | POA: Insufficient documentation

## 2022-08-13 ENCOUNTER — Other Ambulatory Visit: Payer: Self-pay | Admitting: Family Medicine

## 2022-08-13 DIAGNOSIS — G540 Brachial plexus disorders: Secondary | ICD-10-CM

## 2022-08-28 ENCOUNTER — Telehealth: Payer: Self-pay | Admitting: Gastroenterology

## 2022-08-28 NOTE — Telephone Encounter (Signed)
Good Morning Dr. Silverio Decamp,   Patient called stating that Dr. Kipp Brood wanted him to follow up with you for an office visit. Patient stated that back in December of 2023 he was scheduled for Esophageal Manometry with you at [facility] and the procedure was canceled. Patient seen Dr. Kipp Brood in October and was advised to follow up for episodes of reflux and a referral was sent to our practice. Can this patient be scheduled to have a visit with as Dr. Kipp Brood has requested? Please review and advise on scheduling.   Thank you!

## 2022-08-28 NOTE — Telephone Encounter (Signed)
Yes, please schedule next available appt with me . Thanks

## 2022-08-30 ENCOUNTER — Other Ambulatory Visit (HOSPITAL_COMMUNITY): Payer: Self-pay | Admitting: Cardiology

## 2022-09-02 ENCOUNTER — Other Ambulatory Visit: Payer: Self-pay | Admitting: Thoracic Surgery (Cardiothoracic Vascular Surgery)

## 2022-09-02 DIAGNOSIS — I7121 Aneurysm of the ascending aorta, without rupture: Secondary | ICD-10-CM

## 2022-09-04 ENCOUNTER — Telehealth: Payer: Self-pay

## 2022-09-04 ENCOUNTER — Other Ambulatory Visit: Payer: Self-pay

## 2022-09-04 DIAGNOSIS — R1310 Dysphagia, unspecified: Secondary | ICD-10-CM

## 2022-09-04 DIAGNOSIS — K219 Gastro-esophageal reflux disease without esophagitis: Secondary | ICD-10-CM

## 2022-09-04 NOTE — Telephone Encounter (Signed)
Per Dr Silverio Decamp, the patient will need an esophageal manometry. Scheduled the patient for 09/23/22. Called the patient to discuss this. No answer. Left him a message to call me back.

## 2022-09-04 NOTE — Telephone Encounter (Signed)
Spoke with the patient about his procedure date. Patient agrees with this plan. Ambulatory referral entered. Instructions available for the patient. Confirmed he uses My Chart.

## 2022-09-09 ENCOUNTER — Ambulatory Visit (INDEPENDENT_AMBULATORY_CARE_PROVIDER_SITE_OTHER): Payer: HMO

## 2022-09-09 VITALS — Ht 75.0 in | Wt 213.0 lb

## 2022-09-09 DIAGNOSIS — Z0000 Encounter for general adult medical examination without abnormal findings: Secondary | ICD-10-CM

## 2022-09-09 NOTE — Patient Instructions (Addendum)
[name] , Thank you for taking time to come for your Medicare Wellness Visit. I appreciate your ongoing commitment to your health goals. Please review the following plan we discussed and let me know if I can assist you in the future.   These are the goals we discussed:  Goals       patient (pt-stated)      Maintain current health by staying active.        This is a list of the screening recommended for you and due dates:  Health Maintenance  Topic Date Due   Flu Shot  11/01/2022*   Hemoglobin A1C  01/12/2023   Yearly kidney health urinalysis for diabetes  04/10/2023   Complete foot exam   04/10/2023   Eye exam for diabetics  04/28/2023   Yearly kidney function blood test for diabetes  06/20/2023   Medicare Annual Wellness Visit  09/10/2023   Colon Cancer Screening  05/14/2024   Pneumonia Vaccine  Completed   Hepatitis C Screening: USPSTF Recommendation to screen - Ages 19-79 yo.  Completed   HPV Vaccine  Aged Out   DTaP/Tdap/Td vaccine  Discontinued   COVID-19 Vaccine  Discontinued   Zoster (Shingles) Vaccine  Discontinued  *Topic was postponed. The date shown is not the original due date.    Advanced directives: Please bring a copy of your health care power of attorney and living will to the office to be added to your chart at your convenience.   Conditions/risks identified: None  Next appointment: Follow up in one year for your annual wellness visit.    Preventive Care 71 Years and Older, Male  Preventive care refers to lifestyle choices and visits with your health care provider that can promote health and wellness. What does preventive care include? A yearly physical exam. This is also called an annual well check. Dental exams once or twice a year. Routine eye exams. Ask your health care provider how often you should have your eyes checked. Personal lifestyle choices, including: Daily care of your teeth and gums. Regular physical activity. Eating a healthy  diet. Avoiding tobacco and drug use. Limiting alcohol use. Practicing safe sex. Taking low doses of aspirin every day. Taking vitamin and mineral supplements as recommended by your health care provider. What happens during an annual well check? The services and screenings done by your health care provider during your annual well check will depend on your age, overall health, lifestyle risk factors, and family history of disease. Counseling  Your health care provider may ask you questions about your: Alcohol use. Tobacco use. Drug use. Emotional well-being. Home and relationship well-being. Sexual activity. Eating habits. History of falls. Memory and ability to understand (cognition). Work and work Statistician. Screening  You may have the following tests or measurements: Height, weight, and BMI. Blood pressure. Lipid and cholesterol levels. These may be checked every 5 years, or more frequently if you are over 9 years old. Skin check. Lung cancer screening. You may have this screening every year starting at age 71 if you have a 30-pack-year history of smoking and currently smoke or have quit within the past 15 years. Fecal occult blood test (FOBT) of the stool. You may have this test every year starting at age 71. Flexible sigmoidoscopy or colonoscopy. You may have a sigmoidoscopy every 5 years or a colonoscopy every 10 years starting at age 71. Prostate cancer screening. Recommendations will vary depending on your family history and other risks. Hepatitis C blood test.  Hepatitis B blood test. Sexually transmitted disease (STD) testing. Diabetes screening. This is done by checking your blood sugar (glucose) after you have not eaten for a while (fasting). You may have this done every 1-3 years. Abdominal aortic aneurysm (AAA) screening. You may need this if you are a current or former smoker. Osteoporosis. You may be screened starting at age 71 if you are at high risk. Talk with  your health care provider about your test results, treatment options, and if necessary, the need for more tests. Vaccines  Your health care provider may recommend certain vaccines, such as: Influenza vaccine. This is recommended every year. Tetanus, diphtheria, and acellular pertussis (Tdap, Td) vaccine. You may need a Td booster every 10 years. Zoster vaccine. You may need this after age 71. Pneumococcal 13-valent conjugate (PCV13) vaccine. One dose is recommended after age 71. Pneumococcal polysaccharide (PPSV23) vaccine. One dose is recommended after age 71. Talk to your health care provider about which screenings and vaccines you need and how often you need them. This information is not intended to replace advice given to you by your health care provider. Make sure you discuss any questions you have with your health care provider. Document Released: 08/16/2015 Document Revised: 04/08/2016 Document Reviewed: 05/21/2015 Elsevier Interactive Patient Education  2017 [employer] Prevention in the Home Falls can cause injuries. They can happen to people of all ages. There are many things you can do to make your home safe and to help prevent falls. What can I do on the outside of my home? Regularly fix the edges of walkways and driveways and fix any cracks. Remove anything that might make you trip as you walk through a door, such as a raised step or threshold. Trim any bushes or trees on the path to your home. Use bright outdoor lighting. Clear any walking paths of anything that might make someone trip, such as rocks or tools. Regularly check to see if handrails are loose or broken. Make sure that both sides of any steps have handrails. Any raised decks and porches should have guardrails on the edges. Have any leaves, snow, or ice cleared regularly. Use sand or salt on walking paths during winter. Clean up any spills in your garage right away. This includes oil or grease spills. What  can I do in the bathroom? Use night lights. Install grab bars by the toilet and in the tub and shower. Do not use towel bars as grab bars. Use non-skid mats or decals in the tub or shower. If you need to sit down in the shower, use a plastic, non-slip stool. Keep the floor dry. Clean up any water that spills on the floor as soon as it happens. Remove soap buildup in the tub or shower regularly. Attach bath mats securely with double-sided non-slip rug tape. Do not have throw rugs and other things on the floor that can make you trip. What can I do in the bedroom? Use night lights. Make sure that you have a light by your bed that is easy to reach. Do not use any sheets or blankets that are too big for your bed. They should not hang down onto the floor. Have a firm chair that has side arms. You can use this for support while you get dressed. Do not have throw rugs and other things on the floor that can make you trip. What can I do in the kitchen? Clean up any spills right away. Avoid walking on wet floors.  Keep items that you use a lot in easy-to-reach places. If you need to reach something above you, use a strong step stool that has a grab bar. Keep electrical cords out of the way. Do not use floor polish or wax that makes floors slippery. If you must use wax, use non-skid floor wax. Do not have throw rugs and other things on the floor that can make you trip. What can I do with my stairs? Do not leave any items on the stairs. Make sure that there are handrails on both sides of the stairs and use them. Fix handrails that are broken or loose. Make sure that handrails are as long as the stairways. Check any carpeting to make sure that it is firmly attached to the stairs. Fix any carpet that is loose or worn. Avoid having throw rugs at the top or bottom of the stairs. If you do have throw rugs, attach them to the floor with carpet tape. Make sure that you have a light switch at the top of the  stairs and the bottom of the stairs. If you do not have them, ask someone to add them for you. What else can I do to help prevent falls? Wear shoes that: Do not have high heels. Have rubber bottoms. Are comfortable and fit you well. Are closed at the toe. Do not wear sandals. If you use a stepladder: Make sure that it is fully opened. Do not climb a closed stepladder. Make sure that both sides of the stepladder are locked into place. Ask someone to hold it for you, if possible. Clearly mark and make sure that you can see: Any grab bars or handrails. First and last steps. Where the edge of each step is. Use tools that help you move around (mobility aids) if they are needed. These include: Canes. Walkers. Scooters. Crutches. Turn on the lights when you go into a dark area. Replace any light bulbs as soon as they burn out. Set up your furniture so you have a clear path. Avoid moving your furniture around. If any of your floors are uneven, fix them. If there are any pets around you, be aware of where they are. Review your medicines with your doctor. Some medicines can make you feel dizzy. This can increase your chance of falling. Ask your doctor what other things that you can do to help prevent falls. This information is not intended to replace advice given to you by your health care provider. Make sure you discuss any questions you have with your health care provider. Document Released: 05/16/2009 Document Revised: 12/26/2015 Document Reviewed: 08/24/2014 Elsevier Interactive Patient Education  2017 [employer]. Please bring a copy of your health care power of attorney and living will to the office to be added to your chart at your convenience.

## 2022-09-09 NOTE — Progress Notes (Signed)
Subjective:   [name] [name] is a 72 y.o. male who presents for Medicare Annual/Subsequent preventive examination.  Review of Systems    Virtual Visit via Telephone Note  I connected with  [name] [name] on 09/09/22 at 10:30 AM EST by telephone and verified that I am speaking with the correct person using two identifiers.  Location: Patient: Home Provider: Office Persons participating in the virtual visit: patient/Nurse Health Advisor   I discussed the limitations, risks, security and privacy concerns of performing an evaluation and management service by telephone and the availability of in person appointments. The patient expressed understanding and agreed to proceed.  Interactive audio and video telecommunications were attempted between this nurse and patient, however failed, due to patient having technical difficulties OR patient did not have access to video capability.  We continued and completed visit with audio only.  Some vital signs may be absent or patient reported.   Criselda Peaches, LPN  Cardiac Risk Factors include: advanced age (>38mn, >>11women);diabetes mellitus;male gender;hypertension     Objective:    Today's Vitals   09/09/22 1034  Weight: 213 lb (96.6 kg)  Height: '6\' 3"'$  (1.905 m)   Body mass index is 26.62 kg/m.     09/09/2022   10:42 AM 03/26/2022    9:00 AM 03/02/2022    1:29 PM 07/17/2021    9:50 AM 04/19/2021    9:02 AM 07/15/2020    9:03 AM 03/14/2020    9:35 AM  Advanced Directives  Does Patient Have a Medical Advance Directive? Yes Yes No Yes Yes Yes Yes  Type of AParamedicof AArnaudvilleLiving will [place]Living will  [place]land HeightsLiving will Living will;Healthcare Power of AMiami SpringsLiving will [place] Does patient want to make changes to medical advance directive?     No - Patient declined    Copy of [place]in  Chart? No - copy requested   No - copy requested No - copy requested No - copy requested Yes - validated most recent copy scanned in chart (See row information)  Would patient like information on creating a medical advance directive?   No - Patient declined        Current Medications (verified) Outpatient Encounter Medications as of 09/09/2022  Medication Sig   apixaban (ELIQUIS) 5 MG TABS tablet Take 1 tablet (5 mg total) by mouth 2 (two) times daily.   azelastine (ASTELIN) 0.1 % nasal spray Place 2 sprays into both nostrils 2 (two) times daily.   cetirizine (ZYRTEC) 10 MG tablet TAKE 1 TABLET(10 MG) BY MOUTH DAILY   empagliflozin (JARDIANCE) 10 MG TABS tablet Take 1 tablet by mouth daily before breakfast.   famotidine (PEPCID) 40 MG tablet Take 40 mg by mouth 2 (two) times daily.   finasteride (PROSCAR) 5 MG tablet TAKE 1 TABLET(5 MG) BY MOUTH DAILY   fluticasone (FLONASE) 50 MCG/ACT nasal spray Place 2 sprays into both nostrils as needed for allergies or rhinitis.   furosemide (LASIX) 20 MG tablet TAKE 1 TABLET(20 MG) BY MOUTH EVERY OTHER DAY   ipratropium (ATROVENT) 0.06 % nasal spray Place 2 sprays into both nostrils 4 (four) times daily.   losartan (COZAAR) 25 MG tablet Take 0.5 tablets (12.5 mg total) by mouth daily.   metoprolol succinate (TOPROL-XL) 100 MG 24 hr tablet TAKE 1 TABLET(100 MG) BY MOUTH TWICE DAILY   montelukast (SINGULAIR) 10 MG tablet Take  1 tablet (10 mg total) by mouth at bedtime.   pantoprazole (PROTONIX) 40 MG tablet Take 1 tablet (40 mg total) by mouth 2 (two) times daily.   rosuvastatin (CRESTOR) 40 MG tablet TAKE 1 TABLET(40 MG) BY MOUTH DAILY   spironolactone (ALDACTONE) 25 MG tablet TAKE 1 TABLET(25 MG) BY MOUTH DAILY   No facility-administered encounter medications on file as of 09/09/2022.    Allergies (verified) Penicillins and Quinidine   History: Past Medical History:  Diagnosis Date   Acute gastritis    Aortic insufficiency    s/p [place] Procedure:  AVR/Pulmonary autograft in aortic position & PVR with bioprosthetic PV 1994 in [place]   Aortic root aneurysm (HCC)    Atrial flutter (HCC)    history- no atrial flutter since cardioversion   CHF (congestive heart failure) (HCC)    Diabetes mellitus without complication ([place])    type 2   Early satiety    Esophageal stricture    GERD (gastroesophageal reflux disease)    Headache syndrome 10/18/2018   Hiatal hernia    Hyperlipidemia    Neuropathy    left brachial plexus   Pain, dental    Sleep apnea    09/12/20 AHI 35.2/hr. Does not use CPAP. (03/26/22)   Past Surgical History:  Procedure Laterality Date   AORTIC VALVE REPLACEMENT  1994   CARDIOVERSION N/A 03/14/2020   Procedure: CARDIOVERSION;  Surgeon: [name], MD;  Location[facility] ENDOSCOPY;  Service: Cardiovascular;  Laterality: N/A;   ESOPHAGOGASTRODUODENOSCOPY N/A 03/30/2022   Procedure: ESOPHAGOGASTRODUODENOSCOPY (EGD);  Surgeon: Lajuana Matte, MD;  Location: [facility];  Service: Thoracic;  Laterality: N/A;   GANGLION CYST EXCISION  JAN 2002   HIATAL HERNIA REPAIR  DEC 2002   INTERCOSTAL NERVE BLOCK Right 03/30/2022   Procedure: INTERCOSTAL NERVE BLOCK;  Surgeon: Lajuana Matte, MD;  Location: [facility];  Service: Thoracic;  Laterality: Right;   TEE WITHOUT CARDIOVERSION N/A 03/14/2020   Procedure: TRANSESOPHAGEAL ECHOCARDIOGRAM (TEE);  Surgeon: [name], MD;  Location[facility] ENDOSCOPY;  Service: Cardiovascular;  Laterality: N/A;   TONSILLECTOMY     VASECTOMY  [place] 2002   Family History  Problem Relation Age of Onset   Lung cancer Father        hx of smoking    Leukemia Brother        Passed at age of 71   Hypertension Other    Prostate cancer Other    Colon cancer Neg Hx    Pancreatic cancer Neg Hx    Rectal cancer Neg Hx    Stomach cancer Neg Hx    Asthma Neg Hx    Immunodeficiency Neg Hx    Eczema Neg Hx    Atopy Neg Hx    Urticaria Neg Hx    Allergic rhinitis Neg Hx    Angioedema Neg Hx     Social History   Socioeconomic History   Marital status: Married    Spouse name: Belenda Cruise    Number of children: 3   Years of education: Not on file   Highest education level: Master's degree (e.g., MA, MS, MEng, MEd, MSW, MBA)  Occupational History   Occupation: retired  Tobacco Use   Smoking status: Never    Passive exposure: Current (wife smokes in car)   Smokeless tobacco: Never  Vaping Use   Vaping Use: Never used  Substance and Sexual Activity   Alcohol use: No   Drug use: No   Sexual activity: Not on  file  Other Topics Concern   Not on file  Social History Narrative   Caffeine 1-2 cups daily    Right handed   Live in [place] with spouse Belenda Cruise    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   Social Determinants of Health   Financial Resource Strain: Low Risk  (09/09/2022)   Overall Financial Resource Strain (CARDIA)    Difficulty of Paying Living Expenses: Not hard at all  Food Insecurity: No Food Insecurity (09/09/2022)   Hunger Vital Sign    Worried About Running Out of Food in the Last Year: Never true    [place] in the Last Year: Never true  Transportation Needs: No Transportation Needs (09/09/2022)   PRAPARE - Hydrologist (Medical): No    Lack of Transportation (Non-Medical): No  Physical Activity: Sufficiently Active (09/09/2022)   Exercise Vital Sign    Days of Exercise per Week: 7 days    Minutes of Exercise per Session: 30 min  Stress: No Stress Concern Present (09/09/2022)   [place]    Feeling of Stress : Not at all  Social Connections: Moderately Isolated (09/09/2022)   Social Connection and Isolation Panel [NHANES]    Frequency of Communication with Friends and Family: More than three times a week    Frequency of Social Gatherings with Friends and Family: More than three times a week    Attends Religious Services: Never    Building surveyor or Organizations: No    Attends Music therapist: Never    Marital Status: Married    Tobacco Counseling Counseling given: Not Answered   Clinical Intake:  Pre-visit preparation completed: No  Pain : No/denies pain     BMI - recorded: 26.62 Nutritional Status: BMI 25 -29 Overweight Nutritional Risks: None Diabetes: Yes CBG done?: No Did pt. bring in CBG monitor from home?: No  How often do you need to have someone help you when you read instructions, pamphlets, or other written materials from your doctor or pharmacy?: 1 - Never  Diabetic?  Yes   Interpreter Needed?: No  Information entered by :: Rolene Arbour LPN   Activities of Daily Living    09/09/2022   10:40 AM 03/26/2022    9:03 AM  In your present state of health, do you have any difficulty performing the following activities:  Hearing? 0   Vision? 0   Difficulty concentrating or making decisions? 0   Walking or climbing stairs? 0   Dressing or bathing? 0   Doing errands, shopping? 0 0  Preparing Food and eating ? N   Using the Toilet? N   In the past six months, have you accidently leaked urine? N   Do you have problems with loss of bowel control? N   Managing your Medications? N   Managing your Finances? N   Housekeeping or managing your Housekeeping? N     Patient Care Team: [name], MD as PCP - General [name], MD as PCP - Cardiology (Cardiology) [name], MD as Consulting Physician (Cardiology) Grace Isaac, MD (Inactive) as Consulting Physician (Cardiothoracic Surgery) Rana Snare, MD (Inactive) as Consulting Physician (Urology) [place], Jennefer Bravo, MD as Referring Physician (Dermatology) Melissa Montane, MD as Consulting Physician (Otolaryngology)  Indicate any recent Medical Services you may have received from other than [facility] providers in the past year (date may be  approximate).     Assessment:   This is a routine wellness  examination for [place].  Hearing/Vision screen Hearing Screening - Comments:: Denies hearing difficulties   Vision Screening - Comments:: Wears rx glasses - up to date with routine eye exams with  Coralie Keens  Dietary issues and exercise activities discussed: Current Exercise Habits: Home exercise routine, Type of exercise: walking, Time (Minutes): 30, Frequency (Times/Week): 7, Weekly Exercise (Minutes/Week): 210, Intensity: Moderate, Exercise limited by: None identified   Goals Addressed               This Visit's Progress     patient (pt-stated)        Maintain current health by staying active.       Depression Screen    09/09/2022   10:39 AM 07/13/2022   10:22 AM 04/09/2022   10:14 AM 12/31/2021    2:24 PM 08/05/2021    9:06 AM 07/17/2021    9:51 AM 07/17/2021    9:49 AM  PHQ 2/9 Scores  PHQ - 2 Score 0 0 0 0 0 0 0  PHQ- 9 Score 0 0 6 3       Fall Risk    09/09/2022   10:41 AM 07/13/2022   10:22 AM 04/09/2022   10:14 AM 12/31/2021    2:24 PM 08/05/2021    9:06 AM  [place] in the past year? 0 0 0 0 0  Number falls in past yr: 0   0   Injury with Fall? 0   0   Risk for fall due to : No Fall Risks   No Fall Risks No Fall Risks  Follow up Falls prevention discussed Falls evaluation completed Falls evaluation completed Falls evaluation completed Falls evaluation completed    [place]:  Any stairs in or around the home? Yes  If so, are there any without handrails? No  Home free of loose throw rugs in walkways, pet beds, electrical cords, etc? Yes  Adequate lighting in your home to reduce risk of falls? Yes   ASSISTIVE DEVICES UTILIZED TO PREVENT FALLS:  Life alert? No  Use of a cane, walker or w/c? No  Grab bars in the bathroom? Yes  Shower chair or bench in shower? No  Elevated toilet seat or a handicapped toilet? Yes   TIMED UP AND GO:  Was the test performed? No . Audio Visit  Cognitive Function:         09/09/2022   10:42 AM  6CIT Screen  What Year? 0 points  What month? 0 points  What time? 0 points  Count back from 20 0 points  Months in reverse 0 points  Repeat phrase 0 points  Total Score 0 points    Immunizations Immunization History  Administered Date(s) Administered   Fluad Quad(high Dose 65+) 06/16/2019, 07/08/2020   Influenza Split 07/07/2011   Influenza Whole 05/10/2008, 05/29/2010   Influenza, High Dose Seasonal PF 06/14/2018   Influenza, Seasonal, Injecte, Preservative Fre 07/12/2012   Influenza,inj,Quad PF,6+ Mos 05/25/2013, 05/31/2014, 06/03/2015, 06/08/2016, 04/29/2017   Influenza-Unspecified 03/03/2021   Moderna Sars-Covid-2 Vaccination 09/25/2019, 10/24/2019, 07/01/2020   Pneumococcal Conjugate-13 06/14/2018   Pneumococcal Polysaccharide-23 06/16/2019   Tdap 07/12/2012   Zoster Recombinat (Shingrix) 09/08/2012   Zoster, Live 09/08/2012    TDAP status: Up to date  Flu Vaccine status: Up to date  Pneumococcal vaccine status: Up to date  Covid-19 vaccine status: Completed vaccines  Qualifies for  Shingles Vaccine? Yes   Zostavax completed Yes   Shingrix Completed?: Yes  Screening Tests Health Maintenance  Topic Date Due   INFLUENZA VACCINE  11/01/2022 (Originally 03/03/2022)   HEMOGLOBIN A1C  01/12/2023   Diabetic kidney evaluation - Urine ACR  04/10/2023   FOOT EXAM  04/10/2023   OPHTHALMOLOGY EXAM  04/28/2023   Diabetic kidney evaluation - eGFR measurement  06/20/2023   Medicare Annual Wellness (AWV)  09/10/2023   COLONOSCOPY (Pts 45-53yr Insurance coverage will need to be confirmed)  05/14/2024   Pneumonia Vaccine 72 Years old  Completed   Hepatitis C Screening  Completed   HPV VACCINES  Aged Out   DTaP/Tdap/Td  Discontinued   COVID-19 Vaccine  Discontinued   Zoster Vaccines- Shingrix  Discontinued    Health Maintenance  There are no preventive care reminders to display for this patient.   Colorectal cancer screening: Type of screening:  Colonoscopy. Completed 05/14/14. Repeat every 10 years  Lung Cancer Screening: (Low Dose CT Chest recommended if Age 71-80years, 30 pack-year currently smoking OR have quit w/in 15years.) does not qualify.   Additional Screening:  Hepatitis C Screening: does qualify; Completed 01/20/21  Vision Screening: Recommended annual ophthalmology exams for early detection of glaucoma and other disorders of the eye. Is the patient up to date with their annual eye exam?  Yes  Who is the provider or what is the name of the office in which the patient attends annual eye exams[facility] provider, would they like to be referred to a provider to establish care? No .   Dental Screening: Recommended annual dental exams for proper oral hygiene  Community Resource Referral / Chronic Care Management:  CRR required this visit?  No   CCM required this visit?  No      Plan:     I have personally reviewed and noted the following in the patient's chart:   Medical and social history Use of alcohol, tobacco or illicit drugs  Current medications and supplements including opioid prescriptions. Patient is not currently taking opioid prescriptions. Functional ability and status Nutritional status Physical activity Advanced directives List of other physicians Hospitalizations, surgeries, and ER visits in previous 12 months Vitals Screenings to include cognitive, depression, and falls Referrals and appointments  In addition, I have reviewed and discussed with patient certain preventive protocols, quality metrics, and best practice recommendations. A written personalized care plan for preventive services as well as general preventive health recommendations were provided to patient.     BCriselda Peaches LPN   25/08/256  Nurse Notes: None

## 2022-09-21 ENCOUNTER — Ambulatory Visit: Payer: PPO | Admitting: Internal Medicine

## 2022-09-21 ENCOUNTER — Encounter: Payer: Self-pay | Admitting: Internal Medicine

## 2022-09-21 VITALS — BP 112/70 | HR 60 | Temp 97.6°F | Resp 17 | Wt 215.1 lb

## 2022-09-21 DIAGNOSIS — J3089 Other allergic rhinitis: Secondary | ICD-10-CM | POA: Diagnosis not present

## 2022-09-21 DIAGNOSIS — R053 Chronic cough: Secondary | ICD-10-CM | POA: Diagnosis not present

## 2022-09-21 DIAGNOSIS — K219 Gastro-esophageal reflux disease without esophagitis: Secondary | ICD-10-CM | POA: Diagnosis not present

## 2022-09-21 NOTE — Progress Notes (Signed)
Follow Up Note  RE: [name] MRN: [id number] DOB: [birth year] Date of Office Visit: 09/21/2022  Referring provider: [name], MD Primary care provider: [name], MD  Chief Complaint: Follow-up (Pt  states his symptoms have improved just some throat clearing ) and Cough  History of Present Illness: I had the pleasure of seeing [name] for a follow up visit at the [facility] on 09/21/2022. He is a 72 y.o. male, who is being followed for chronic cough, rhinitis and gerd. His previous allergy office visit was on 07/21/22 with Dr. Edison Pace. Today is a regular follow up visit.  History obtained from patient, chart review.  Today he reports:   Some improvement in cough. He still has throat clearing, but nasal congestion and rhinnorhea has improved with nasal atrovent.  Current triggers include foods: (chilli powder, salsa)  He is scheduled for esophageal mannometry for evaluation of swallowing.   Feels like he has a "gurgle" around his vocal cords when he swallows and mild dysphagia.    Denies any adverse effects of medication.     Pertinent History/Diagnostics:  - Cough : neurogenic, LPR and rhinitis: worse in evenings, triggered by eatng   - On pantoprazole for LPR   - Seen ENT 06/09/22: diagnosed with LPR  - Allergic Rhinitis:   - SPT environmental panel (07/21/22): dust mite   - On xyzal, astelin, flonase and atrovent     Assessment and Plan: Wilfrid is a 72 y.o. male with: Refractory chronic cough  Other allergic rhinitis  Laryngopharyngeal reflux (LPR)   Plan: Patient Instructions  Chronic Cough  -Plan to continue current local treatment.  Will see what GI says with manometry.  If still with refractory symptoms at follow-up and no new findings and GI will consider starting treatment for neurogenic cough  PLAN: - allergen avoidance to dust mite  - Continue Nasal Steroid Spray: Options include Flonase  (fluticasone), Nasocort (triamcinolone), Nasonex (mometasome) 1- 2 sprays in each nostril daily (can buy over-the-counter if not covered by insurance)  Best results if used daily. - Continue Astelin (Azelastine) 1-2 sprays in each nostril twice a day as needed.  You may use this as needed for nasal congestion/itchy ears/itchy nose if desired - Continue Atrovent (Ipratropium Bromide) 1-2 sprays in each nostril up to 3 times a day as needed for runny nose/post nasal drip/drainage.  Use less frequently if airway gets too dry. - Continue Singulair (Montelukast) 77m nightly. - Continue over the counter antihistamine daily or daily as needed.   - Sample given for xyzal which is the one I recommend   GERD - continue dietary and lifestyle modifications  - Continue pantoprazole as prescribed  - Follow up with Gi for mannometry on Wednesday   Follow up: 4 months   Thank you so much for letting me partake in your care today.  Don't hesitate to reach out if you have any additional concerns!  ERoney Marion MD  [facility] [place] [place]    No orders of the defined types were placed in this encounter.   Lab Orders  No laboratory test(s) ordered today   Diagnostics: None done   Medication List:  Current Outpatient Medications  Medication Sig Dispense Refill   apixaban (ELIQUIS) 5 MG TABS tablet Take 1 tablet (5 mg total) by mouth 2 (two) times daily. 180 tablet 3   azelastine (ASTELIN) 0.1 % nasal spray Place 2 sprays into both nostrils 2 (  two) times daily. 30 mL 12   cetirizine (ZYRTEC) 10 MG tablet TAKE 1 TABLET(10 MG) BY MOUTH DAILY 90 tablet 0   empagliflozin (JARDIANCE) 10 MG TABS tablet Take 1 tablet by mouth daily before breakfast. 90 tablet 3   famotidine (PEPCID) 40 MG tablet Take 40 mg by mouth 2 (two) times daily.     finasteride (PROSCAR) 5 MG tablet TAKE 1 TABLET(5 MG) BY MOUTH DAILY 90 tablet 1   fluticasone (FLONASE) 50 MCG/ACT nasal spray Place 2 sprays  into both nostrils as needed for allergies or rhinitis. 50 mL 30   furosemide (LASIX) 20 MG tablet TAKE 1 TABLET(20 MG) BY MOUTH EVERY OTHER DAY 15 tablet 11   ipratropium (ATROVENT) 0.06 % nasal spray Place 2 sprays into both nostrils 4 (four) times daily. 15 mL 12   losartan (COZAAR) 25 MG tablet Take 0.5 tablets (12.5 mg total) by mouth daily. 45 tablet 3   metoprolol succinate (TOPROL-XL) 100 MG 24 hr tablet TAKE 1 TABLET(100 MG) BY MOUTH TWICE DAILY 180 tablet 0   montelukast (SINGULAIR) 10 MG tablet Take 1 tablet (10 mg total) by mouth at bedtime. 30 tablet 6   pantoprazole (PROTONIX) 40 MG tablet Take 1 tablet (40 mg total) by mouth 2 (two) times daily. 180 tablet 1   rosuvastatin (CRESTOR) 40 MG tablet TAKE 1 TABLET(40 MG) BY MOUTH DAILY 90 tablet 3   spironolactone (ALDACTONE) 25 MG tablet TAKE 1 TABLET(25 MG) BY MOUTH DAILY 90 tablet 3   No current facility-administered medications for this visit.   Allergies: Allergies  Allergen Reactions   Penicillins Hives and Swelling   Quinidine Other (See Comments)    Increase heart beat per patient   I reviewed his past medical history, social history, family history, and environmental history and no significant changes have been reported from his previous visit.  ROS: All others negative except as noted per HPI.   Objective: BP 112/70   Pulse 60   Temp 97.6 F (36.4 C) (Temporal)   Resp 17   Wt 215 lb 1.6 oz (97.6 kg)   SpO2 98%   BMI 26.89 kg/m  Body mass index is 26.89 kg/m. General Appearance:  Alert, cooperative, no distress, appears stated age  Head:  Normocephalic, without obvious abnormality, atraumatic  Eyes:  Conjunctiva clear, EOM's intact  Nose: Nares normal, normal mucosa, no visible anterior polyps, and septum midline  Throat: Lips, tongue normal; teeth and gums normal, normal posterior oropharynx  Neck: Supple, symmetrical  Lungs:   , Respirations unlabored,  intermittent throat clearing cough  Heart:  ,  Appears well perfused  Extremities: No edema  Skin: Skin color, texture, turgor normal, no rashes or lesions on visualized portions of skin  Neurologic: No gross deficits   Previous notes and tests were reviewed. The plan was reviewed with the patient/family, and all questions/concerned were addressed.  It was my pleasure to see [name] [name] today and participate in his care. Please feel free to contact me with any questions or concerns.  Sincerely,  Roney Marion, MD  Allergy & Immunology  [facility] Office: [phone]

## 2022-09-21 NOTE — Patient Instructions (Signed)
Chronic Cough  -Plan to continue current local treatment.  Will see what GI says with manometry.  If still with refractory symptoms at follow-up and no new findings and GI will consider starting treatment for neurogenic cough  PLAN: - allergen avoidance to dust mite  - Continue Nasal Steroid Spray: Options include Flonase (fluticasone), Nasocort (triamcinolone), Nasonex (mometasome) 1- 2 sprays in each nostril daily (can buy over-the-counter if not covered by insurance)  Best results if used daily. - Continue Astelin (Azelastine) 1-2 sprays in each nostril twice a day as needed.  You may use this as needed for nasal congestion/itchy ears/itchy nose if desired - Continue Atrovent (Ipratropium Bromide) 1-2 sprays in each nostril up to 3 times a day as needed for runny nose/post nasal drip/drainage.  Use less frequently if airway gets too dry. - Continue Singulair (Montelukast) 75m nightly. - Continue over the counter antihistamine daily or daily as needed.   - Sample given for xyzal which is the one I recommend   GERD - continue dietary and lifestyle modifications  - Continue pantoprazole as prescribed  - Follow up with Gi for mannometry on Wednesday   Follow up: 4 months   Thank you so much for letting me partake in your care today.  Don't hesitate to reach out if you have any additional concerns!  ERoney Marion MD  [facility] [place]

## 2022-09-23 ENCOUNTER — Encounter (HOSPITAL_COMMUNITY): Payer: Self-pay

## 2022-09-23 ENCOUNTER — Encounter (HOSPITAL_COMMUNITY)
Admission: RE | Disposition: A | Discharge status: Home / Self Care | Payer: Self-pay | Source: Home / Self Care | Attending: Gastroenterology

## 2022-09-23 ENCOUNTER — Encounter (HOSPITAL_COMMUNITY): Payer: Self-pay | Admitting: Gastroenterology

## 2022-09-23 ENCOUNTER — Ambulatory Visit (HOSPITAL_COMMUNITY)
Admission: RE | Admit: 2022-09-23 | Discharge: 2022-09-23 | Disposition: A | Discharge status: Home / Self Care | Payer: PPO | Attending: Gastroenterology | Admitting: Gastroenterology

## 2022-09-23 DIAGNOSIS — R1319 Other dysphagia: Secondary | ICD-10-CM | POA: Diagnosis not present

## 2022-09-23 DIAGNOSIS — R1310 Dysphagia, unspecified: Secondary | ICD-10-CM

## 2022-09-23 HISTORY — PX: ESOPHAGEAL MANOMETRY: SHX5429

## 2022-09-23 SURGERY — MANOMETRY, ESOPHAGUS
Anesthesia: Choice

## 2022-09-23 MED ORDER — LIDOCAINE VISCOUS HCL 2 % MT SOLN
OROMUCOSAL | Status: AC
  Filled 2022-09-23: qty 15

## 2022-09-23 SURGICAL SUPPLY — 2 items
FACESHIELD LNG OPTICON STERILE (SAFETY) IMPLANT
GLOVE BIO SURGEON STRL SZ8 (GLOVE) ×4 IMPLANT

## 2022-09-23 NOTE — Progress Notes (Signed)
Esophageal Manometry done per protocol. Patient tolerated well without distress or complication.

## 2022-09-24 ENCOUNTER — Other Ambulatory Visit: Payer: Self-pay | Admitting: Family Medicine

## 2022-09-24 ENCOUNTER — Other Ambulatory Visit (HOSPITAL_COMMUNITY): Payer: Self-pay | Admitting: Cardiology

## 2022-09-24 DIAGNOSIS — K219 Gastro-esophageal reflux disease without esophagitis: Secondary | ICD-10-CM

## 2022-09-25 ENCOUNTER — Encounter (HOSPITAL_COMMUNITY): Payer: Self-pay | Admitting: Gastroenterology

## 2022-10-06 ENCOUNTER — Ambulatory Visit: Payer: PPO | Admitting: Dermatology

## 2022-10-08 ENCOUNTER — Ambulatory Visit
Admission: RE | Admit: 2022-10-08 | Discharge: 2022-10-08 | Disposition: A | Discharge status: Home / Self Care | Payer: PPO | Source: Ambulatory Visit | Attending: Thoracic Surgery (Cardiothoracic Vascular Surgery) | Admitting: Thoracic Surgery (Cardiothoracic Vascular Surgery)

## 2022-10-08 DIAGNOSIS — J8410 Pulmonary fibrosis, unspecified: Secondary | ICD-10-CM | POA: Diagnosis not present

## 2022-10-08 DIAGNOSIS — I2510 Atherosclerotic heart disease of native coronary artery without angina pectoris: Secondary | ICD-10-CM | POA: Diagnosis not present

## 2022-10-08 DIAGNOSIS — I712 Thoracic aortic aneurysm, without rupture, unspecified: Secondary | ICD-10-CM | POA: Diagnosis not present

## 2022-10-08 DIAGNOSIS — I253 Aneurysm of heart: Secondary | ICD-10-CM | POA: Diagnosis not present

## 2022-10-08 DIAGNOSIS — I7121 Aneurysm of the ascending aorta, without rupture: Secondary | ICD-10-CM

## 2022-10-08 MED ORDER — IOPAMIDOL (ISOVUE-370) INJECTION 76%
75.0000 mL | Freq: Once | INTRAVENOUS | Status: AC | PRN
  Administered 2022-10-08: 75 mL via INTRAVENOUS

## 2022-10-13 ENCOUNTER — Encounter: Payer: Self-pay | Admitting: Family Medicine

## 2022-10-13 ENCOUNTER — Encounter: Payer: Self-pay | Admitting: Gastroenterology

## 2022-10-13 ENCOUNTER — Ambulatory Visit (INDEPENDENT_AMBULATORY_CARE_PROVIDER_SITE_OTHER): Payer: PPO | Admitting: Family Medicine

## 2022-10-13 ENCOUNTER — Ambulatory Visit: Payer: PPO | Admitting: Thoracic Surgery (Cardiothoracic Vascular Surgery)

## 2022-10-13 VITALS — BP 125/71 | HR 56 | Resp 18 | Ht 75.0 in | Wt 215.0 lb

## 2022-10-13 VITALS — BP 128/76 | HR 61 | Temp 97.9°F | Resp 17 | Ht 75.0 in | Wt 216.4 lb

## 2022-10-13 DIAGNOSIS — I7121 Aneurysm of the ascending aorta, without rupture: Secondary | ICD-10-CM

## 2022-10-13 DIAGNOSIS — Z0000 Encounter for general adult medical examination without abnormal findings: Secondary | ICD-10-CM | POA: Diagnosis not present

## 2022-10-13 DIAGNOSIS — E118 Type 2 diabetes mellitus with unspecified complications: Secondary | ICD-10-CM | POA: Diagnosis not present

## 2022-10-13 DIAGNOSIS — Z125 Encounter for screening for malignant neoplasm of prostate: Secondary | ICD-10-CM | POA: Diagnosis not present

## 2022-10-13 LAB — HEPATIC FUNCTION PANEL
ALT: 23 U/L (ref 0–53)
AST: 21 U/L (ref 0–37)
Albumin: 4.1 g/dL (ref 3.5–5.2)
Alkaline Phosphatase: 65 U/L (ref 39–117)
Bilirubin, Direct: 0.3 mg/dL (ref 0.0–0.3)
Total Bilirubin: 1.4 mg/dL — ABNORMAL HIGH (ref 0.2–1.2)
Total Protein: 6.5 g/dL (ref 6.0–8.3)

## 2022-10-13 LAB — LIPID PANEL
Cholesterol: 121 mg/dL (ref 0–200)
HDL: 41.6 mg/dL (ref >39.00)
LDL Cholesterol: 52 mg/dL (ref 0–99)
NonHDL: 79.08
Total CHOL/HDL Ratio: 3
Triglycerides: 133 mg/dL (ref 0.0–149.0)
VLDL: 26.6 mg/dL (ref 0.0–40.0)

## 2022-10-13 LAB — BASIC METABOLIC PANEL
BUN: 15 mg/dL (ref 6–23)
CO2: 26 mEq/L (ref 19–32)
Calcium: 9.6 mg/dL (ref 8.4–10.5)
Chloride: 103 mEq/L (ref 96–112)
Creatinine, Ser: 0.77 mg/dL (ref 0.40–1.50)
GFR: 90.88 mL/min (ref >60.00)
Glucose, Bld: 138 mg/dL — ABNORMAL HIGH (ref 70–99)
Potassium: 4.7 mEq/L (ref 3.5–5.1)
Sodium: 138 mEq/L (ref 135–145)

## 2022-10-13 LAB — CBC WITH DIFFERENTIAL/PLATELET
Basophils Absolute: 0 10*3/uL (ref 0.0–0.1)
Basophils Relative: 0.2 % (ref 0.0–3.0)
Eosinophils Absolute: 0.1 10*3/uL (ref 0.0–0.7)
Eosinophils Relative: 1.4 % (ref 0.0–5.0)
HCT: 44.7 % (ref 39.0–52.0)
Hemoglobin: 14.9 g/dL (ref 13.0–17.0)
Lymphocytes Relative: 23.1 % (ref 12.0–46.0)
Lymphs Abs: 1.8 10*3/uL (ref 0.7–4.0)
MCHC: 33.5 g/dL (ref 30.0–36.0)
MCV: 88.3 fl (ref 78.0–100.0)
Monocytes Absolute: 0.9 10*3/uL (ref 0.1–1.0)
Monocytes Relative: 10.7 % (ref 3.0–12.0)
Neutro Abs: 5.2 10*3/uL (ref 1.4–7.7)
Neutrophils Relative %: 64.6 % (ref 43.0–77.0)
Platelets: 290 10*3/uL (ref 150.0–400.0)
RBC: 5.06 Mil/uL (ref 4.22–5.81)
RDW: 14.7 % (ref 11.5–15.5)
WBC: 8 10*3/uL (ref 4.0–10.5)

## 2022-10-13 LAB — TSH: TSH: 2.77 u[IU]/mL (ref 0.35–5.50)

## 2022-10-13 LAB — HEMOGLOBIN A1C: Hgb A1c MFr Bld: 7.6 % — ABNORMAL HIGH (ref 4.6–6.5)

## 2022-10-13 LAB — PSA, MEDICARE: PSA: 2.43 ng/ml (ref 0.10–4.00)

## 2022-10-13 NOTE — Assessment & Plan Note (Signed)
Chronic problem.  UTD on microalbumin, foot exam, eye exam.  Check labs.  Adjust meds prn

## 2022-10-13 NOTE — Assessment & Plan Note (Signed)
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  UTD on colonoscopy, PNA.  Check labs.  Anticipatory guidance provided.

## 2022-10-13 NOTE — Patient Instructions (Signed)
Follow up in 3-4 months to recheck diabetes We'll notify you of your lab results and make any changes if needed Keep up the good work on healthy diet and regular exercise- you're doing great! Call with any questions or concerns Stay Safe!  Stay Healthy! Happy Spring!!!

## 2022-10-13 NOTE — Progress Notes (Signed)
[address]       [place] [address]             [phone]     HPI: [name] [name] returns for follow-up of his aortic root/ascending aortic root aneurysms  [name] [name] is a 72 year old man with a history of congenital aortic insufficiency, Ross procedure, aortic root and ascending aneurysms, hiatal hernia, reflux, esophageal stricture, esophageal diverticulum, hypertension, left brachial plexopathy, and elevated left hemidiaphragm.  He had a Ross procedure in [name] [name] in 1994.  An MRI in 2015 showed a 5.4 cm aortic root.  He was followed by [name] [name] for several years.  I took over follow-up after he retired.  The aortic root aneurysm is measured between 5.4 and 5.6 cm dating back over 5 years.  In the interim since his last visit he has been feeling well.  No chest pain, pressure, tightness, shortness of breath, orthopnea, or peripheral edema.  Remains fairly active.  Past Medical History:  Diagnosis Date   Acute gastritis    Aortic insufficiency    s/p Ross Procedure: AVR/Pulmonary autograft in aortic position & PVR with bioprosthetic PV 1994 in [name] [name]   Aortic root aneurysm (HCC)    Atrial flutter (HCC)    history- no atrial flutter since cardioversion   CHF (congestive heart failure) (HCC)    Diabetes mellitus without complication ([place])    type 2   Early satiety    Esophageal stricture    GERD (gastroesophageal reflux disease)    Headache syndrome 10/18/2018   Hiatal hernia    Hyperlipidemia    Neuropathy    left brachial plexus   Pain, dental    Sleep apnea    09/12/20 AHI 35.2/hr. Does not use CPAP. (03/26/22)    Current Outpatient Medications  Medication Sig Dispense Refill   apixaban (ELIQUIS) 5 MG TABS tablet Take 1 tablet (5 mg total) by mouth 2 (two) times daily. 180 tablet 3   azelastine (ASTELIN) 0.1 % nasal spray Place 2 sprays into both nostrils 2 (two) times daily. 30 mL 12   cetirizine (ZYRTEC) 10 MG tablet TAKE 1 TABLET(10 MG)  BY MOUTH DAILY 90 tablet 0   empagliflozin (JARDIANCE) 10 MG TABS tablet Take 1 tablet by mouth daily before breakfast. 90 tablet 3   famotidine (PEPCID) 40 MG tablet Take 40 mg by mouth 2 (two) times daily.     finasteride (PROSCAR) 5 MG tablet TAKE 1 TABLET(5 MG) BY MOUTH DAILY 90 tablet 1   fluticasone (FLONASE) 50 MCG/ACT nasal spray Place 2 sprays into both nostrils as needed for allergies or rhinitis. 50 mL 30   furosemide (LASIX) 20 MG tablet TAKE 1 TABLET(20 MG) BY MOUTH EVERY OTHER DAY 15 tablet 11   ipratropium (ATROVENT) 0.06 % nasal spray Place 2 sprays into both nostrils 4 (four) times daily. 15 mL 12   losartan (COZAAR) 25 MG tablet Take 0.5 tablets (12.5 mg total) by mouth daily. 45 tablet 3   metoprolol succinate (TOPROL-XL) 100 MG 24 hr tablet TAKE 1 TABLET(100 MG) BY MOUTH TWICE DAILY 180 tablet 0   montelukast (SINGULAIR) 10 MG tablet Take 1 tablet (10 mg total) by mouth at bedtime. 30 tablet 6   pantoprazole (PROTONIX) 40 MG tablet TAKE 1 TABLET(40 MG) BY MOUTH DAILY 90 tablet 1   rosuvastatin (CRESTOR) 40 MG tablet TAKE 1 TABLET(40 MG) BY MOUTH DAILY 90 tablet 3   spironolactone (ALDACTONE) 25 MG tablet TAKE 1  TABLET(25 MG) BY MOUTH DAILY 90 tablet 3   No current facility-administered medications for this visit.    Physical Exam BP 125/71 (BP Location: Left Arm, Patient Position: Sitting)   Pulse (!) 56   Resp 18   Ht '6\' 3"'$  (1.905 m)   Wt 215 lb (97.5 kg)   SpO2 94% Comment: RA  BMI 26.44 kg/m  72 year old man in no acute distress Alert and oriented x 3 with no focal deficits No carotid bruits Cardiac regular rate and rhythm with a question of a faint systolic murmur, no diastolic murmur Lungs absent breath sounds left base, otherwise clear No peripheral edema  Diagnostic Tests: CT ANGIOGRAPHY CHEST WITH CONTRAST   TECHNIQUE: Multidetector CT imaging of the chest was performed using the standard protocol during bolus administration of intravenous contrast.  Multiplanar CT image reconstructions and MIPs were obtained to evaluate the vascular anatomy.   RADIATION DOSE REDUCTION: This exam was performed according to the departmental dose-optimization program which includes automated exposure control, adjustment of the mA and/or kV according to patient size and/or use of iterative reconstruction technique.   CONTRAST:  38m ISOVUE-370 IOPAMIDOL (ISOVUE-370) INJECTION 76%   COMPARISON:  Prior CT scan of the chest 07/07/2022   FINDINGS: Cardiovascular: Similar abnormal appearance of the aortic root. Focal aneurysmal outpouching of the left sinus of Valsalva is again noted. The outpouching measures approximately 2.3 x 2.1 cm on the axial images. The left main coronary artery arises from this abnormal appearing left aortic sinus. Finding is essentially unchanged compared to prior. The overall aortic root measured at this location demonstrates a maximal diameter of 5.6 cm. Prior tube graft repair of the ascending thoracic aorta again noted. Residual mild aneurysmal dilation of the residual native thoracic aorta remains unchanged at 4.5 cm. Conventional 3 vessel arch anatomy. Scattered atherosclerotic calcifications. Calcifications present throughout the coronary arteries. Calcifications present along the main pulmonary artery which is somewhat atypical and suggest prior surgical intervention. The heart is normal in size. No pericardial effusion.   Mediastinum/Nodes: Unremarkable thyroid gland. Scattered calcified mediastinal and right hilar lymph nodes consistent with old granulomatous disease. Unremarkable esophagus.   Lungs/Pleura: Benign calcified granuloma in the right lower lobe again noted. No focal infiltrate, pulmonary edema, pleural effusion or pneumothorax. Elevation of the left hemidiaphragm.   Upper Abdomen: Solitary high attenuation focus in the gallbladder neck likely represents cholelithiasis. Small nonobstructing stone  in the interpolar left kidney. Colonic diverticular disease without CT evidence of active inflammation.   Musculoskeletal: No acute fracture or aggressive appearing lytic or blastic osseous lesion.   Review of the MIP images confirms the above findings.   IMPRESSION: 1. Stable appearance of the irregular aortic root with focal aneurysmal dilation of the left coronary sinus. The outpouching measures approximately 2.3 x 2.1 cm on the axial images in the overall aortic root measures unchanged at 5.6 cm in diameter including the aneurysmal left coronary sinus. 2. Evidence of prior tube graft repair of the ascending thoracic aorta with residual aneurysmal dilation of the ascending aorta proximal to the repair at 4.5 cm. This is unchanged compared to prior. 3. Sequelae of old granulomatous disease. 4. Cholelithiasis. 5. Nonobstructing left renal stone. 6. Colonic diverticular disease without CT evidence of active inflammation.   Aortic Atherosclerosis (ICD10-I70.0).     Electronically Signed   By: HJacqulynn CadetM.D.   On: 10/08/2022 13:43   I personally reviewed the CT images.  5.6 cm maximal diameter aortic root at the sinuses of Valsalva  with focal outpouching at the takeoff of the left main coronary.  No change dating back to 2019.  4.5 cm aneurysm of the neoaorta.  Some mild calcification of the pulmonary homograft.  Impression: [name] [name] is a 72 year old man with a history of congenital aortic insufficiency, Ross procedure, aortic root and ascending aneurysms, hiatal hernia, reflux, esophageal stricture, esophageal diverticulum, hypertension, left brachial plexopathy, and elevated left hemidiaphragm.  Aortic root/ascending neoaortic aneurysm-he was concerned because the measurement was greater than 5.5 cm and he has been told that would require surgery.  I reviewed his CTs with him.  He went back to 2019 and there really has been no change at all.  His aneurysm is  obviously unique given his prior Ross procedure.  The area in question was unchanged dating back years before that, even before he saw [name] [name] originally. Therefore, I do not think there is any indication for surgery at this point in time.  Certainly any further dilatation would be of concern.  There is always some risk of rupture or dissection, but I do not think that risk is any higher than it was 5 years ago.  It has been a couple of years since he has had an echocardiogram.  Will plan to get one when he returns.  His blood pressure is well-controlled.  Plan: Return in 6 months with CT angio of chest and 2D echo.  I spent over 30 minutes in review of records, images, and in consultation with Mr. Chacko today [name] Nakayama, MD [facility] [phone]

## 2022-10-13 NOTE — Progress Notes (Signed)
   Subjective:    Patient ID: [name] [name], male    DOB: 1951-06-18, 72 y.o.   MRN: [id number]  HPI CPE- UTD on microalbumin, foot exam, eye exam, colonoscopy, PNA.  No concerns today  Patient Care Team    Relationship Specialty Notifications Start End  [name], MD PCP - General   07/16/10    Comment: Soledad Gerlach, MD PCP - Cardiology Cardiology  01/19/22   [name], MD Consulting Physician Cardiology  03/14/15   Grace Isaac, MD (Inactive) Consulting Physician Cardiothoracic Surgery  06/03/15   Rana Snare, MD (Inactive) Consulting Physician Urology  06/03/15   Devra Dopp, MD Referring Physician Dermatology  06/10/17   Melissa Montane, MD Consulting Physician Otolaryngology  06/10/17     Health Maintenance  Topic Date Due   INFLUENZA VACCINE  11/01/2022 (Originally 03/03/2022)   HEMOGLOBIN A1C  01/12/2023   Diabetic kidney evaluation - Urine ACR  04/10/2023   FOOT EXAM  04/10/2023   OPHTHALMOLOGY EXAM  04/28/2023   Diabetic kidney evaluation - eGFR measurement  06/20/2023   Medicare Annual Wellness (AWV)  09/10/2023   COLONOSCOPY (Pts 45-73yrs Insurance coverage will need to be confirmed)  05/14/2024   Pneumonia Vaccine 37+ Years old  Completed   Hepatitis C Screening  Completed   HPV VACCINES  Aged Out   DTaP/Tdap/Td  Discontinued   COVID-19 Vaccine  Discontinued   Zoster Vaccines- Shingrix  Discontinued      Review of Systems Patient reports no vision/hearing changes, anorexia, fever ,adenopathy, persistant/recurrent hoarseness, chest pain, palpitations, edema, persistant/recurrent cough, hemoptysis, dyspnea (rest,exertional, paroxysmal nocturnal), gastrointestinal  bleeding (melena, rectal bleeding), abdominal pain, excessive heart burn, GU symptoms (dysuria, hematuria, voiding/incontinence issues) syncope, focal weakness, memory loss, numbness & tingling, skin/hair/nail changes, depression, anxiety, abnormal bruising/bleeding,  musculoskeletal symptoms/signs.  + mild dysphagia- seeing GI     Objective:   Physical Exam General Appearance:    Alert, cooperative, no distress, appears stated age  Head:    Normocephalic, without obvious abnormality, atraumatic  Eyes:    PERRL, conjunctiva/corneas clear, EOM's intact both eyes       Ears:    Normal TM's and external ear canals, both ears  Nose:   Nares normal, septum midline, mucosa normal, no drainage   or sinus tenderness  Throat:   Lips, mucosa, and tongue normal; teeth and gums normal  Neck:   Supple, symmetrical, trachea midline, no adenopathy;       thyroid:  No enlargement/tenderness/nodules  Back:     Symmetric, no curvature, ROM normal, no CVA tenderness  Lungs:     Clear to auscultation bilaterally, respirations unlabored  Chest wall:    No tenderness or deformity  Heart:    Regular rate and rhythm, mechanical valve click, rub   or gallop  Abdomen:     Soft, non-tender, bowel sounds active all four quadrants,    no masses, no organomegaly  Genitalia:    deferred  Rectal:    Extremities:   Extremities normal, atraumatic, no cyanosis or edema  Pulses:   2+ and symmetric all extremities  Skin:   Skin color, texture, turgor normal, no rashes or lesions  Lymph nodes:   Cervical, supraclavicular, and axillary nodes normal  Neurologic:   CNII-XII intact. Normal strength, sensation and reflexes      throughout          Assessment & Plan:

## 2022-10-14 ENCOUNTER — Telehealth: Payer: Self-pay

## 2022-10-14 NOTE — Telephone Encounter (Signed)
Informed pt of lab results  

## 2022-10-14 NOTE — Telephone Encounter (Signed)
-----   Message from [name], MD sent at 10/14/2022  7:25 AM EDT ----- Labs are all stable.  This is good news.  No med changes at this time

## 2022-10-19 ENCOUNTER — Encounter: Payer: Self-pay | Admitting: Dermatology

## 2022-10-19 ENCOUNTER — Ambulatory Visit (INDEPENDENT_AMBULATORY_CARE_PROVIDER_SITE_OTHER): Payer: PPO | Admitting: Dermatology

## 2022-10-19 VITALS — BP 117/78 | HR 53

## 2022-10-19 DIAGNOSIS — L309 Dermatitis, unspecified: Secondary | ICD-10-CM | POA: Diagnosis not present

## 2022-10-19 DIAGNOSIS — B078 Other viral warts: Secondary | ICD-10-CM | POA: Diagnosis not present

## 2022-10-19 DIAGNOSIS — L570 Actinic keratosis: Secondary | ICD-10-CM | POA: Diagnosis not present

## 2022-10-19 MED ORDER — HYDROCORTISONE 2.5 % EX CREA: TOPICAL_CREAM | CUTANEOUS | 2 refills | Status: DC

## 2022-10-19 NOTE — Progress Notes (Signed)
New Patient Visit  Subjective  [name] [name] is a 72 y.o. male who presents for the following: Annual Exam (No personal or family Hx of skin cancer. Areas of concern at groin area. C/O spider bite this past weekend on abdomen).  The patient presents for Total-Body Skin Exam (TBSE) for skin cancer screening and mole check.  The patient has spots, moles and lesions to be evaluated, some may be new or changing and the patient has concerns that these could be cancer.   Review of Systems: No other skin or systemic complaints except as noted in HPI or Assessment and Plan.   Objective  Well appearing patient in no apparent distress; mood and affect are within normal limits.  A full examination was performed including scalp, head, eyes, ears, nose, lips, neck, chest, axillae, abdomen, back, buttocks, bilateral upper extremities, bilateral lower extremities, hands, feet, fingers, toes, fingernails, and toenails. All findings within normal limits unless otherwise noted below.  A dermatoscope was used during the exam.  The following people were also present during my examination: , my medical assistant (male)   Left Temple x2, nose x2, left forearm x3, right forearm x1 (8) Erythematous thin papules/macules with gritty scale.   Right Malar Cheek, abdomen Erythema with mild scale  glans penis x1 Verrucous papule   Assessment & Plan   Lentigines - Scattered tan macules - Due to sun exposure - Benign-appearing, observe - Recommend daily broad spectrum sunscreen SPF 30+ to sun-exposed areas, reapply every 2 hours as needed. - Call for any changes  Seborrheic Keratoses - Stuck-on, waxy, tan-brown papules and/or plaques  - Benign-appearing - Discussed benign etiology and prognosis. - Observe - Call for any changes  Melanocytic Nevi - Tan-brown and/or pink-flesh-colored symmetric macules and papules - Benign appearing on exam today - Observation - Call clinic for new or  changing moles - Recommend daily use of broad spectrum spf 30+ sunscreen to sun-exposed areas.   Hemangiomas - Red papules - Discussed benign nature - Observe - Call for any changes  Actinic Damage - Chronic condition, secondary to cumulative UV/sun exposure - diffuse scaly erythematous macules with underlying dyspigmentation - Recommend daily broad spectrum sunscreen SPF 30+ to sun-exposed areas, reapply every 2 hours as needed.  - Staying in the shade or wearing long sleeves, sun glasses (UVA+UVB protection) and wide brim hats (4-inch brim around the entire circumference of the hat) are also recommended for sun protection.  - Call for new or changing lesions.  Skin cancer screening performed today.  Milia - tiny firm white papules - type of cyst - benign - may be extracted if symptomatic - observe   AK (actinic keratosis) (8) Left Temple x2, nose x2, left forearm x3, right forearm x1  Destruction of lesion - Left Temple x2, nose x2, left forearm x3, right forearm x1 Complexity: simple   Destruction method: cryotherapy   Informed consent: discussed and consent obtained   Timeout:  patient name, date of birth, surgical site, and procedure verified Lesion destroyed using liquid nitrogen: Yes   Region frozen until ice ball extended beyond lesion: Yes   Outcome: patient tolerated procedure well with no complications   Post-procedure details: wound care instructions given   Additional details:  Prior to procedure, discussed risks of blister formation, small wound, skin dyspigmentation, or rare scar following cryotherapy. Recommend Vaseline ointment to treated areas while healing.   Dermatitis Right Malar Cheek, abdomen  Start Hydrocortisone 2.5% cream twice daily up to 2 weeks  as needed for rash  Topical steroids (such as triamcinolone, fluocinolone, fluocinonide, mometasone, clobetasol, halobetasol, betamethasone, hydrocortisone) can cause thinning and lightening of the skin  if they are used for too long in the same area. Your physician has selected the right strength medicine for your problem and area affected on the body. Please use your medication only as directed by your physician to prevent side effects.    hydrocortisone 2.5 % cream - Right Malar Cheek, abdomen Apply twice daily to affected areas as needed for rash/itching up to 2 weeks  Other viral warts glans penis x1  Viral Wart (HPV) Counseling  Discussed viral / HPV (Human Papilloma Virus) etiology and risk of spread /infectivity to other areas of body as well as to other people.  Multiple treatments and methods may be required to clear warts and it is possible treatment may not be successful.  Treatment risks include discoloration; scarring and there is still potential for wart recurrence.   Destruction of lesion - glans penis x1 Complexity: simple   Destruction method: cryotherapy   Informed consent: discussed and consent obtained   Timeout:  patient name, date of birth, surgical site, and procedure verified Lesion destroyed using liquid nitrogen: Yes   Region frozen until ice ball extended beyond lesion: Yes   Outcome: patient tolerated procedure well with no complications   Post-procedure details: wound care instructions given   Additional details:  Prior to procedure, discussed risks of blister formation, small wound, skin dyspigmentation, or rare scar following cryotherapy. Recommend Vaseline ointment to treated areas while healing.    Return in about 1 year (around 10/19/2023) for TBSE, 1 month recheck wart.  I, Emelia Salisbury, CMA, am acting as scribe for Ellard Artis, MD.  Documentation: I have reviewed the above documentation for accuracy and completeness, and I agree with the above  [place], DO

## 2022-10-19 NOTE — Patient Instructions (Addendum)
Actinic keratoses are precancerous spots that appear secondary to cumulative UV radiation exposure/sun exposure over time. They are chronic with expected duration over 1 year. A portion of actinic keratoses will progress to squamous cell carcinoma of the skin. It is not possible to reliably predict which spots will progress to skin cancer and so treatment is recommended to prevent development of skin cancer.  Recommend daily broad spectrum sunscreen SPF 30+ to sun-exposed areas, reapply every 2 hours as needed.  Recommend staying in the shade or wearing long sleeves, sun glasses (UVA+UVB protection) and wide brim hats (4-inch brim around the entire circumference of the hat). Call for new or changing lesions.  Prior to procedure, discussed risks of blister formation, small wound, skin dyspigmentation, or rare scar following cryotherapy. Recommend Vaseline ointment to treated areas while healing.    Start Hydrocortisone 2.5% cream twice daily up to 2 weeks as needed for rash

## 2022-10-27 ENCOUNTER — Other Ambulatory Visit: Payer: Self-pay

## 2022-10-27 NOTE — Progress Notes (Signed)
[name] [name]    [id number]    04-13-1951  Primary Care Physician:Tabori, Helane Rima, MD  Referring Physician: Sheliah Hatch, MD [address] [place],  [place] [address]   Chief complaint:   Chief Complaint  Patient presents with   Manometry study   HPI: Patient was experiencing dysphagia and GERD symptoms. He has done an Esophageal Manometry with me on 09-23-22. Patient is here to discuss results.   Patient is accompanied by his wife for today's visit.   Today, he still continues to experience discomfort in his esophagus. He states that his pain and symptoms are sever and would last for days. His wife states that he wasn't able to breath last night due to his symptoms. He reports taking a cough drop to help clear his throat and be able to speak normally and states that that's the only thing that has been helping relief his symptoms. He states that his symptoms does not wake him up at night.   He states that hard food such has bread or meat are typically hard to swallow. He reports an occasion where he was snacking on popcorn and one kernel had gotten stuck in his throat and he was unable to clear it out.   He reports having lost some weight.   He denies diarrhea, constipation, nausea, blood in stool, black stool, vomiting, abdominal pain, bloating, or reflux.  GI Hx:  ESOPHAGUS/BARIUM SWALLOW/TABLET STUDY 03-31-22 No evidence of esophageal leak.   -Removal of Diverticulum on the esophagus 03-26-23 ESOPHAGUS/BARIUM SWALLOW/TABLET STUDY 01-20-22 1. Mild dysmotility. 2. Moderate mid esophageal diverticulum arising from the RIGHT lateral soft Gus. 3. No signs of narrowing or gross lesion on single contrast evaluation. 4. No signs of gastroesophageal reflux or hiatal hernia.  CT Angio chest w contrast 10-08-22 1. Stable appearance of the irregular aortic root with focal aneurysmal dilation of the left coronary sinus. The outpouching measures approximately  2.3 x 2.1 cm on the axial images in the overall aortic root measures unchanged at 5.6 cm in diameter including the aneurysmal left coronary sinus. 2. Evidence of prior tube graft repair of the ascending thoracic aorta with residual aneurysmal dilation of the ascending aorta proximal to the repair at 4.5 cm. This is unchanged compared to prior. 3. Sequelae of old granulomatous disease. 4. Cholelithiasis. 5. Nonobstructing left renal stone. 6. Colonic diverticular disease without CT evidence of active inflammation.   Colonoscopy 05-14-14 diverticculosis   Colonoscopy 05-07-14 Normal   EGD 03-29-03 -Hiatal Hernia -GERD -Esophageal Stricture    Current Outpatient Medications:    apixaban (ELIQUIS) 5 MG TABS tablet, Take 1 tablet (5 mg total) by mouth 2 (two) times daily., Disp: 180 tablet, Rfl: 3   azelastine (ASTELIN) 0.1 % nasal spray, Place 2 sprays into both nostrils 2 (two) times daily., Disp: 30 mL, Rfl: 12   cetirizine (ZYRTEC) 10 MG tablet, TAKE 1 TABLET(10 MG) BY MOUTH DAILY, Disp: 90 tablet, Rfl: 0   empagliflozin (JARDIANCE) 10 MG TABS tablet, Take 1 tablet by mouth daily before breakfast., Disp: 90 tablet, Rfl: 3   famotidine (PEPCID) 40 MG tablet, Take 40 mg by mouth 2 (two) times daily., Disp: , Rfl:    finasteride (PROSCAR) 5 MG tablet, TAKE 1 TABLET(5 MG) BY MOUTH DAILY, Disp: 90 tablet, Rfl: 1   fluticasone (FLONASE) 50 MCG/ACT nasal spray, Place 2 sprays into both nostrils as needed for allergies or rhinitis., Disp: 50 mL,  Rfl: 30   furosemide (LASIX) 20 MG tablet, TAKE 1 TABLET(20 MG) BY MOUTH EVERY OTHER DAY, Disp: 15 tablet, Rfl: 11   hydrocortisone 2.5 % cream, Apply twice daily to affected areas as needed for rash/itching up to 2 weeks, Disp: 30 g, Rfl: 2   ipratropium (ATROVENT) 0.06 % nasal spray, Place 2 sprays into both nostrils 4 (four) times daily., Disp: 15 mL, Rfl: 12   losartan (COZAAR) 25 MG tablet, Take 0.5 tablets (12.5 mg total) by mouth daily.,  Disp: 45 tablet, Rfl: 3   metoprolol succinate (TOPROL-XL) 100 MG 24 hr tablet, TAKE 1 TABLET(100 MG) BY MOUTH TWICE DAILY, Disp: 180 tablet, Rfl: 0   montelukast (SINGULAIR) 10 MG tablet, Take 1 tablet (10 mg total) by mouth at bedtime., Disp: 30 tablet, Rfl: 6   pantoprazole (PROTONIX) 40 MG tablet, TAKE 1 TABLET(40 MG) BY MOUTH DAILY, Disp: 90 tablet, Rfl: 1   rosuvastatin (CRESTOR) 40 MG tablet, TAKE 1 TABLET(40 MG) BY MOUTH DAILY, Disp: 90 tablet, Rfl: 3   spironolactone (ALDACTONE) 25 MG tablet, TAKE 1 TABLET(25 MG) BY MOUTH DAILY, Disp: 90 tablet, Rfl: 3   Allergies as of 11/09/2022 - Review Complete 11/09/2022  Allergen Reaction Noted   Penicillins Hives and Swelling 08/08/2008   Quinidine Other (See Comments) 10/20/2012    Past Medical History:  Diagnosis Date   Acute gastritis    Aortic insufficiency    s/p Ross Procedure: AVR/Pulmonary autograft in aortic position & PVR with bioprosthetic PV 1994 in [place]   Aortic root aneurysm    Atrial flutter    history- no atrial flutter since cardioversion   CHF (congestive heart failure)    Diabetes mellitus without complication    type 2   Early satiety    Esophageal stricture    GERD (gastroesophageal reflux disease)    Headache syndrome 10/18/2018   Hiatal hernia    Hyperlipidemia    Neuropathy    left brachial plexus   Pain, dental    Sleep apnea    09/12/20 AHI 35.2/hr. Does not use CPAP. (03/26/22)    Past Surgical History:  Procedure Laterality Date   AORTIC VALVE REPLACEMENT  1994   CARDIOVERSION N/A 03/14/2020   Procedure: CARDIOVERSION;  Surgeon: Laurey Morale, MD;  Location[facility] ENDOSCOPY;  Service: Cardiovascular;  Laterality: N/A;   ESOPHAGEAL MANOMETRY N/A 09/23/2022   Procedure: ESOPHAGEAL MANOMETRY (EM);  Surgeon: Napoleon Form, MD;  Location: [facility] ENDOSCOPY;  Service: Gastroenterology;  Laterality: N/A;   ESOPHAGOGASTRODUODENOSCOPY N/A 03/30/2022   Procedure: ESOPHAGOGASTRODUODENOSCOPY (EGD);   Surgeon: Corliss Skains, MD;  Location: [facility] OR;  Service: Thoracic;  Laterality: N/A;   GANGLION CYST EXCISION  JAN 2002   HIATAL HERNIA REPAIR  DEC 2002   INTERCOSTAL NERVE BLOCK Right 03/30/2022   Procedure: INTERCOSTAL NERVE BLOCK;  Surgeon: Corliss Skains, MD;  Location: [facility] OR;  Service: Thoracic;  Laterality: Right;   TEE WITHOUT CARDIOVERSION N/A 03/14/2020   Procedure: TRANSESOPHAGEAL ECHOCARDIOGRAM (TEE);  Surgeon: Laurey Morale, MD;  Location: [facility] ENDOSCOPY;  Service: Cardiovascular;  Laterality: N/A;   TONSILLECTOMY     VASECTOMY  DEC 2002    Family History  Problem Relation Age of Onset   Lung cancer Father        hx of smoking    Leukemia Brother        Passed at age of 65   Hypertension Other    Prostate cancer Other    Colon cancer Neg Hx  Pancreatic cancer Neg Hx    Rectal cancer Neg Hx    Stomach cancer Neg Hx    Asthma Neg Hx    Immunodeficiency Neg Hx    Eczema Neg Hx    Atopy Neg Hx    Urticaria Neg Hx    Allergic rhinitis Neg Hx    Angioedema Neg Hx     Social History   Socioeconomic History   Marital status: Married    Spouse name: Natalia Leatherwood    Number of children: 3   Years of education: Not on file   Highest education level: Master's degree (e.g., MA, MS, MEng, MEd, MSW, MBA)  Occupational History   Occupation: retired  Tobacco Use   Smoking status: Never    Passive exposure: Current (wife smokes in car)   Smokeless tobacco: Never  Vaping Use   Vaping Use: Never used  Substance and Sexual Activity   Alcohol use: No   Drug use: No   Sexual activity: Not on file  Other Topics Concern   Not on file  Social History Narrative   Caffeine 1-2 cups daily    Right handed   Live in [place] with spouse Natalia Leatherwood    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   Social Determinants of Health   Financial Resource Strain: Low Risk  (09/09/2022)   Overall Financial Resource Strain (CARDIA)    Difficulty of Paying Living  Expenses: Not hard at all  Food Insecurity: No Food Insecurity (09/09/2022)   Hunger Vital Sign    Worried About Running Out of Food in the Last Year: Never true    Ran Out of Food in the Last Year: Never true  Transportation Needs: No Transportation Needs (09/09/2022)   PRAPARE - Administrator, Civil Service (Medical): No    Lack of Transportation (Non-Medical): No  Physical Activity: Sufficiently Active (09/09/2022)   Exercise Vital Sign    Days of Exercise per Week: 7 days    Minutes of Exercise per Session: 30 min  Stress: No Stress Concern Present (09/09/2022)   [employer] of Occupational Health - Occupational Stress Questionnaire    Feeling of Stress : Not at all  Social Connections: Moderately Isolated (09/09/2022)   Social Connection and Isolation Panel [NHANES]    Frequency of Communication with Friends and Family: More than three times a week    Frequency of Social Gatherings with Friends and Family: More than three times a week    Attends Religious Services: Never    Database administrator or Organizations: No    Attends Banker Meetings: Never    Marital Status: Married  Catering manager Violence: Not At Risk (09/09/2022)   Humiliation, Afraid, Rape, and Kick questionnaire    Fear of Current or Ex-Partner: No    Emotionally Abused: No    Physically Abused: No    Sexually Abused: No      Review of systems: Review of Systems  Constitutional:  Positive for unexpected weight change.  HENT:  Positive for trouble swallowing.   Respiratory:  Positive for cough.   Gastrointestinal:  Negative for abdominal distention, abdominal pain, anal bleeding, blood in stool, constipation, diarrhea, nausea, rectal pain and vomiting.  Skin:  Negative for rash.  All other systems reviewed and are negative.     Physical Exam: Vitals:   11/09/22 1342  BP: 130/72  Pulse: (!) 58  SpO2: 97%   Body mass index is 26.67 kg/m.  General: well-appearing  Eyes:  sclera anicteric, no redness Skin; warm and dry, no rash or jaundice noted Neuro: awake, alert and oriented x 3. Normal gross motor function and fluent speech   Data Reviewed:  Reviewed labs, radiology imaging, old records and pertinent past GI work up   Assessment and Plan/Recommendations:  -Modified Barium study today -Advised to have Botox therapy done -will refer to ENT  -Advised to use peppermint oil with warm water  This visit required 40 minutes of patient care (this includes precharting, chart review, review of results, face-to-face time used for counseling as well as treatment plan and follow-up. The patient was provided an opportunity to ask questions and all were answered. The patient agreed with the plan and demonstrated an understanding of the instructions.  Iona Beard , MD  CC: Sheliah Hatch, MD   Ladona Mow Hewitt Shorts as a scribe for Marsa Aris, MD.,have documented all relevant documentation on the behalf of Marsa Aris, MD,as directed by  Marsa Aris, MD while in the presence of Marsa Aris, MD.   I, Marsa Aris, MD, have reviewed all documentation for this visit. The documentation on 11/09/22 for the exam, diagnosis, procedures, and orders are all accurate and complete.

## 2022-10-29 DIAGNOSIS — R1310 Dysphagia, unspecified: Secondary | ICD-10-CM

## 2022-11-09 ENCOUNTER — Ambulatory Visit: Payer: PPO | Admitting: Gastroenterology

## 2022-11-09 ENCOUNTER — Encounter: Payer: Self-pay | Admitting: Gastroenterology

## 2022-11-09 VITALS — BP 130/72 | HR 58 | Ht 75.0 in | Wt 213.4 lb

## 2022-11-09 DIAGNOSIS — R1110 Vomiting, unspecified: Secondary | ICD-10-CM

## 2022-11-09 DIAGNOSIS — K220 Achalasia of cardia: Secondary | ICD-10-CM | POA: Diagnosis not present

## 2022-11-09 DIAGNOSIS — J385 Laryngeal spasm: Secondary | ICD-10-CM

## 2022-11-09 DIAGNOSIS — R1312 Dysphagia, oropharyngeal phase: Secondary | ICD-10-CM | POA: Diagnosis not present

## 2022-11-09 NOTE — Patient Instructions (Addendum)
You have been scheduled for a modified barium swallow on      at      . Please arrive 30 minutes prior to your test for registration. You will go to [facility] Radiology (1st Floor) for your appointment. Should you need to cancel or reschedule your appointment, please contact [phone] [name] [name]) or [phone] [facility]). _____________________________________________________________________ A Modified Barium Swallow Study, or MBS, is a special x-ray that is taken to check swallowing skills. It is carried out by a Marine scientist and a Warehouse manager (SLP). During this test, yourmouth, throat, and esophagus, a muscular tube which connects your mouth to your stomach, is checked. The test will help you, your doctor, and the SLP plan what types of foods and liquids are easier for you to swallow. The SLP will also identify positions and ways to help you swallow more easily and safely. What will happen during an MBS? You will be taken to an x-ray room and seated comfortably. You will be asked to swallow small amounts of food and liquid mixed with barium. Barium is a liquid or paste that allows images of your mouth, throat and esophagus to be seen on x-ray. The x-ray captures moving images of the food you are swallowing as it travels from your mouth through your throat and into your esophagus. This test helps identify whether food or liquid is entering your lungs (aspiration). The test also shows which part of your mouth or throat lacks strength or coordination to move the food or liquid in the right direction. This test typically takes 30 minutes to 1 hour to complete. _______________________________________________________________________  __________________________________________________________________________________  Please start using peppermint oil drops diluted in water, 3- 4 times a day.   We have sent a referral to ENT, please reach back out if you haven't heard from them in a  few weeks.   We have scheduled you for a follow up on 03/21/2023 at 1:50pm _______________________________________________________  If your blood pressure at your visit was 140/90 or greater, please contact your primary care physician to follow up on this.  _______________________________________________________  If you are age 35 or older, your body mass index should be between 23-30. Your Body mass index is 26.67 kg/m. If this is out of the aforementioned range listed, please consider follow up with your Primary Care Provider.  If you are age 66 or younger, your body mass index should be between 19-25. Your Body mass index is 26.67 kg/m. If this is out of the aformentioned range listed, please consider follow up with your Primary Care Provider.   ________________________________________________________  The [facility] providers would like to encourage you to use [facility] to communicate with providers for non-urgent requests or questions.  Due to long hold times on the telephone, sending your provider a message by [facility] may be a faster and more efficient way to get a response.  Please allow 48 business hours for a response.  Please remember that this is for non-urgent requests.  _______________________________________________________ It was a pleasure to see you today!  Thank you for trusting me with your gastrointestinal care!

## 2022-11-10 ENCOUNTER — Telehealth (HOSPITAL_COMMUNITY): Payer: Self-pay

## 2022-11-10 ENCOUNTER — Other Ambulatory Visit (HOSPITAL_COMMUNITY): Payer: Self-pay

## 2022-11-10 DIAGNOSIS — R1310 Dysphagia, unspecified: Secondary | ICD-10-CM

## 2022-11-10 NOTE — Telephone Encounter (Signed)
Attempted to contact patient to schedule OP MBS - left voicemail. ?

## 2022-11-12 ENCOUNTER — Encounter: Payer: Self-pay | Admitting: Gastroenterology

## 2022-11-17 ENCOUNTER — Ambulatory Visit (HOSPITAL_COMMUNITY)
Admission: RE | Admit: 2022-11-17 | Discharge: 2022-11-17 | Disposition: A | Discharge status: Home / Self Care | Payer: PPO | Source: Ambulatory Visit | Attending: Family Medicine | Admitting: Family Medicine

## 2022-11-17 DIAGNOSIS — K222 Esophageal obstruction: Secondary | ICD-10-CM | POA: Diagnosis not present

## 2022-11-17 DIAGNOSIS — R1310 Dysphagia, unspecified: Secondary | ICD-10-CM

## 2022-11-17 DIAGNOSIS — R059 Cough, unspecified: Secondary | ICD-10-CM | POA: Insufficient documentation

## 2022-11-17 DIAGNOSIS — K220 Achalasia of cardia: Secondary | ICD-10-CM

## 2022-11-17 DIAGNOSIS — R933 Abnormal findings on diagnostic imaging of other parts of digestive tract: Secondary | ICD-10-CM | POA: Insufficient documentation

## 2022-11-17 DIAGNOSIS — K0889 Other specified disorders of teeth and supporting structures: Secondary | ICD-10-CM | POA: Insufficient documentation

## 2022-11-17 DIAGNOSIS — R1312 Dysphagia, oropharyngeal phase: Secondary | ICD-10-CM

## 2022-11-17 NOTE — Progress Notes (Signed)
Modified Barium Swallow Study  Patient Details  Name: HAWKEN BIELBY MRN: [id number] Date of Birth: November 14, 1950  Today's Date: 11/17/2022  Modified Barium Swallow completed.  Full report located under Chart Review in the Imaging Section.  History of Present Illness 72 yr old seen for outpatient MBS with complaints of constant throat clearing, coughing during meals, pharyngeal globus sensation, trouble swallowing pills. Esophagram 01/2022 revealed mid esophageal diverticulum and pt stated he had surgical intervention "last fall". Mild dysmotility, no GERD or hernia. Mannometry 09/23/22 revelaed cricopharyngeal achalasia with possible refer to ENT for botox.. Pt has experienced laryngospasms and ha difficulty eating bread.   Clinical Impression Pt's oral and pharyngeal phases of swallow are within normal limits. He was able to orally contain bolus and transit without difficulty. Adequate mastication and transit with solid texture. Pt's initiation of swallow, laryngeal elevation, laryngeal closure, epiglottic deflection were normal to prevent any penetration or aspiration during study. There was trace-minimal residue in pyriform sinuses with the thicker liquid textures. He has a prominent cricopharyngeus muscle with partial distention/partial obstruction of flow however there was no retention and all barium was able to clear at and below the PES. MBS does not diagnose below the level of the PES however esophageal scan was unremarkable. Barium pill was able to transit esophagus without difficulty. SLP discussed findings with pt and advised him to have ENT referral to further assess cricopharyngeus region and need for intervention. Discussed ensuring textures are moist, second swallows, alternate liquids and solids (which pt has been doing) and remain upright after meals. Also advised pt to inhale through nose deeply if he suspects he may have a laryngospam or experiences one. Factors that may increase risk  of adverse event in presence of aspiration Rubye Oaks & Clearance Coots 2021):    Swallow Evaluation Recommendations Recommendations: PO diet PO Diet Recommendation: Regular;Thin liquids (Level 0) Liquid Administration via: Cup;Straw Medication Administration: Whole meds with liquid Supervision: Patient able to self-feed Postural changes: Stay upright 30-60 min after meals;Position pt fully upright for meals Oral care recommendations: Oral care BID (2x/day) Recommended consults: Consider ENT consultation      Royce Macadamia 11/17/2022,1:23 PM

## 2022-11-18 ENCOUNTER — Other Ambulatory Visit: Payer: Self-pay | Admitting: Family Medicine

## 2022-11-18 DIAGNOSIS — G540 Brachial plexus disorders: Secondary | ICD-10-CM

## 2022-11-24 ENCOUNTER — Ambulatory Visit (INDEPENDENT_AMBULATORY_CARE_PROVIDER_SITE_OTHER): Payer: PPO | Admitting: Dermatology

## 2022-11-24 ENCOUNTER — Encounter: Payer: Self-pay | Admitting: Dermatology

## 2022-11-24 VITALS — BP 112/71

## 2022-11-24 DIAGNOSIS — B078 Other viral warts: Secondary | ICD-10-CM

## 2022-11-24 NOTE — Patient Instructions (Signed)
    Due to recent changes in healthcare laws, you may see results of your pathology and/or laboratory studies on MyChart before the doctors have had a chance to review them. We understand that in some cases there may be results that are confusing or concerning to you. Please understand that not all results are received at the same time and often the doctors may need to interpret multiple results in order to provide you with the best plan of care or course of treatment. Therefore, we ask that you please give us 2 business days to thoroughly review all your results before contacting the office for clarification. Should we see a critical lab result, you will be contacted sooner.   If You Need Anything After Your Visit  If you have any questions or concerns for your doctor, please call our main line at [phone] If no one answers, please leave a voicemail as directed and we will return your call as soon as possible. Messages left after 4 pm will be answered the following business day.   You may also send us a message via MyChart. We typically respond to MyChart messages within 1-2 business days.  For prescription refills, please ask your pharmacy to contact our office. Our fax number is [phone].  If you have an urgent issue when the clinic is closed that cannot wait until the next business day, you can page your doctor at the number below.    Please note that while we do our best to be available for urgent issues outside of office hours, we are not available 24/7.   If you have an urgent issue and are unable to reach us, you may choose to seek medical care at your doctor's office, retail clinic, urgent care center, or emergency room.  If you have a medical emergency, please immediately call 911 or go to the emergency department. In the event of inclement weather, please call our main line at [phone] for an update on the status of any delays or closures.  Dermatology Medication  Tips: Please keep the boxes that topical medications come in in order to help keep track of the instructions about where and how to use these. Pharmacies typically print the medication instructions only on the boxes and not directly on the medication tubes.   If your medication is too expensive, please contact our office at [phone] or send us a message through MyChart.   We are unable to tell what your co-pay for medications will be in advance as this is different depending on your insurance coverage. However, we may be able to find a substitute medication at lower cost or fill out paperwork to get insurance to cover a needed medication.   If a prior authorization is required to get your medication covered by your insurance company, please allow us 1-2 business days to complete this process.  Drug prices often vary depending on where the prescription is filled and some pharmacies may offer cheaper prices.  The website www.goodrx.com contains coupons for medications through different pharmacies. The prices here do not account for what the cost may be with help from insurance (it may be cheaper with your insurance), but the website can give you the price if you did not use any insurance.  - You can print the associated coupon and take it with your prescription to the pharmacy.  - You may also stop by our office during regular business hours and pick up a GoodRx coupon card.  -   If you need your prescription sent electronically to a different pharmacy, notify our office through [facility] MyChart or by phone at [phone]    Cryotherapy Aftercare  Wash gently with soap and water everyday.   Apply Vaseline and Band-Aid daily until healed.  

## 2022-11-24 NOTE — Progress Notes (Signed)
   Follow-Up Visit   Subjective  [name] [name] is a 71 y.o. male who presents for the following: Wart  He states wart is gone on the genital area.  Patient wants to know if a lesion at the right neck x 2-3 months can be treated. It is getting rubbed by his collar.   The following portions of the chart were reviewed this encounter and updated as appropriate: medications, allergies, medical history  Review of Systems:  No other skin or systemic complaints except as noted in HPI or Assessment and Plan.  Objective  Well appearing patient in no apparent distress; mood and affect are within normal limits.  Areas Examined: Penis and right neck  Relevant physical exam findings are noted in the Assessment and Plan.    Assessment & Plan     WART Exam: verrucous papule(s)  Discussed viral / HPV (Human Papilloma Virus) etiology and risk of spread /infectivity to other areas of body as well as to other people.  Multiple treatments and methods may be required to clear warts and it is possible treatment may not be successful.  Treatment risks include discoloration; scarring and there is still potential for wart recurrence.  Treatment Plan: No treatment needed on penis today. Reassurance given  Destruction Procedure Note Destruction method: cryotherapy   Informed consent: discussed and consent obtained   Lesion destroyed using liquid nitrogen: Yes   Outcome: patient tolerated procedure well with no complications   Post-procedure details: wound care instructions given   Locations: right neck # of Lesions Treated: 1  Prior to procedure, discussed risks of blister formation, small wound, skin dyspigmentation, or rare scar following cryotherapy. Recommend Vaseline ointment to treated areas while healing.   No follow-ups on file.  Jaclynn Guarneri, CMA, am acting as scribe for Langston Reusing, MD.   Documentation: I have reviewed the above documentation for accuracy and  completeness, and I agree with the above.  Langston Reusing, MD

## 2022-11-25 ENCOUNTER — Encounter: Payer: Self-pay | Admitting: Gastroenterology

## 2022-12-01 ENCOUNTER — Other Ambulatory Visit (HOSPITAL_COMMUNITY): Payer: Self-pay | Admitting: Cardiology

## 2022-12-15 ENCOUNTER — Encounter: Payer: Self-pay | Admitting: Family Medicine

## 2022-12-15 ENCOUNTER — Telehealth: Payer: Self-pay | Admitting: *Deleted

## 2022-12-15 DIAGNOSIS — K220 Achalasia of cardia: Secondary | ICD-10-CM

## 2022-12-15 NOTE — Telephone Encounter (Signed)
[name] [name], Our Prior authorization team looked into this and said you actually do not need a prior authorization to be seen at this office. They fall under Medicare guidelines. I believe you would be better off by calling and seeing if you can make your appointment directly. [facility] [place], [place] and [place]. [phone] Not sure why she said they do not take your insurance. Im so sorry for all of this confusion trying to get you seen by an ENT. [name] CMA ===View-only below this line===   ----- Message -----      From:[name] [name]      Sent:12/08/2022 10:48 PM EDT        GE:[id number] Medical Advice Request Message List   Subject:Modified Barium Study  Hoping not to be pesky, I hope you were able to make a successful referral to a [facility].  May I know who this is, I would like to inquire as to their availability.  My symptoms persist and I need help in this.  Thank you again!  [name] [name]   ----- Message -----      From:[name] [name]      Sent:11/27/2022  4:42 PM EDT        KG:[id number] Medical Advice Request Message List   Subject:Modified Barium Study  Thank you!   ----- Message -----      From: (proxy for CMA [name])      Sent:11/27/2022  4:41 PM EDT        ZD:[id number] F Gladd   Subject:Modified Barium Study  Good afternoon, Your barium swallow results are consistent with your manometry study.. 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 Dr Lavon Paganini recommends you see a ENT specialist at [facility]. Monday I will send out that referral. If you have a preference of a ENT in [facility] affiliated with [facility] let us know.   Thank You [name], CMA    Call us on Monday [phone] with any questions you may have or if you want to speak with Dr Lavon Paganini. Have a good weekend   ----- Message -----      From:[name]      Sent:11/25/2022  9:16 AM EDT        [name], MD   Subject:Modified Barium Study  Good morning!   I was checking in to see if you had had a chance to look over the results of my modified barium study, done April 16?  I see where it went to my family doctor, but do not see where you have weighed in on the results. Also, I have had no contact with any ENT yet as to their doing Botox injections.  My symptoms persist, and if I need to be doing anything in the meantime, please let me know. [name] [name]

## 2022-12-15 NOTE — Telephone Encounter (Signed)
PT is aware that he needs to speak to insurance to find the correct ENT but he says that he needs to have something from Dr. Lavon Paganini and needs to speak in regards to that. Please advise

## 2022-12-15 NOTE — Telephone Encounter (Signed)
Es helped me and tried to do a prior Auth to ENT and he doesn't need one because that office falls under the Medicare guidelines Sent patient message reply in My Chart

## 2022-12-19 ENCOUNTER — Other Ambulatory Visit (HOSPITAL_COMMUNITY): Payer: Self-pay | Admitting: Cardiology

## 2022-12-27 ENCOUNTER — Other Ambulatory Visit (HOSPITAL_COMMUNITY): Payer: Self-pay | Admitting: Cardiology

## 2023-01-06 DIAGNOSIS — R1314 Dysphagia, pharyngoesophageal phase: Secondary | ICD-10-CM | POA: Diagnosis not present

## 2023-01-06 DIAGNOSIS — K220 Achalasia of cardia: Secondary | ICD-10-CM | POA: Insufficient documentation

## 2023-01-06 DIAGNOSIS — J385 Laryngeal spasm: Secondary | ICD-10-CM | POA: Diagnosis not present

## 2023-01-11 DIAGNOSIS — H6121 Impacted cerumen, right ear: Secondary | ICD-10-CM | POA: Diagnosis not present

## 2023-01-11 DIAGNOSIS — J385 Laryngeal spasm: Secondary | ICD-10-CM | POA: Diagnosis not present

## 2023-01-11 DIAGNOSIS — R1314 Dysphagia, pharyngoesophageal phase: Secondary | ICD-10-CM | POA: Diagnosis not present

## 2023-01-11 DIAGNOSIS — Z79899 Other long term (current) drug therapy: Secondary | ICD-10-CM | POA: Diagnosis not present

## 2023-01-11 DIAGNOSIS — H6123 Impacted cerumen, bilateral: Secondary | ICD-10-CM | POA: Diagnosis not present

## 2023-01-11 DIAGNOSIS — K220 Achalasia of cardia: Secondary | ICD-10-CM | POA: Diagnosis not present

## 2023-01-12 DIAGNOSIS — H6121 Impacted cerumen, right ear: Secondary | ICD-10-CM | POA: Diagnosis not present

## 2023-01-13 ENCOUNTER — Ambulatory Visit (INDEPENDENT_AMBULATORY_CARE_PROVIDER_SITE_OTHER): Payer: PPO | Admitting: Family Medicine

## 2023-01-13 ENCOUNTER — Encounter: Payer: Self-pay | Admitting: Family Medicine

## 2023-01-13 VITALS — BP 110/70 | HR 54 | Temp 98.2°F | Resp 18 | Ht 75.0 in | Wt 214.0 lb

## 2023-01-13 DIAGNOSIS — E118 Type 2 diabetes mellitus with unspecified complications: Secondary | ICD-10-CM | POA: Diagnosis not present

## 2023-01-13 DIAGNOSIS — Z7984 Long term (current) use of oral hypoglycemic drugs: Secondary | ICD-10-CM

## 2023-01-13 LAB — BASIC METABOLIC PANEL
BUN: 14 mg/dL (ref 6–23)
CO2: 28 mEq/L (ref 19–32)
Calcium: 9.5 mg/dL (ref 8.4–10.5)
Chloride: 101 mEq/L (ref 96–112)
Creatinine, Ser: 0.84 mg/dL (ref 0.40–1.50)
GFR: 88.36 mL/min (ref >60.00)
Glucose, Bld: 163 mg/dL — ABNORMAL HIGH (ref 70–99)
Potassium: 4.7 mEq/L (ref 3.5–5.1)
Sodium: 136 mEq/L (ref 135–145)

## 2023-01-13 LAB — HEMOGLOBIN A1C: Hgb A1c MFr Bld: 7.4 % — ABNORMAL HIGH (ref 4.6–6.5)

## 2023-01-13 NOTE — Assessment & Plan Note (Signed)
Ongoing issue for pt.  Currently on Jardiance w/o difficulty.  Last A1C 7.6%.  UTD on eye exam, foot exam, microalbumin.  Check labs and adjust meds prn

## 2023-01-13 NOTE — Progress Notes (Signed)
   Subjective:    Patient ID: [name] [name], male    DOB: [birth year], 72 y.o.   MRN: [id number]  HPI DM- chronic problem, on Jardiance 10mg  daily.  Last A1C 7.6%.  on ARB for renal protection.  UTD on microalbumin, eye exam, foot exam.  Pt reports feeling well.  No CP, SOB, HA's, visual changes, abd pain, N/V.  No numbness/tingling of hands/feet.  Denies symptomatic lows.   Review of Systems For ROS see HPI     Objective:   Physical Exam Vitals reviewed.  Constitutional:      General: He is not in acute distress.    Appearance: Normal appearance. He is well-developed. He is not ill-appearing.  HENT:     Head: Normocephalic and atraumatic.  Eyes:     Extraocular Movements: Extraocular movements intact.     Conjunctiva/sclera: Conjunctivae normal.     Pupils: Pupils are equal, round, and reactive to light.  Neck:     Thyroid: No thyromegaly.  Cardiovascular:     Rate and Rhythm: Normal rate and regular rhythm.     Pulses: Normal pulses.     Heart sounds: Murmur (mechanical click over RUSB) heard.  Pulmonary:     Effort: Pulmonary effort is normal. No respiratory distress.     Breath sounds: Normal breath sounds.  Abdominal:     General: Bowel sounds are normal. There is no distension.     Palpations: Abdomen is soft.  Musculoskeletal:     Cervical back: Normal range of motion and neck supple.     Right lower leg: No edema.     Left lower leg: No edema.  Lymphadenopathy:     Cervical: No cervical adenopathy.  Skin:    General: Skin is warm and dry.  Neurological:     General: No focal deficit present.     Mental Status: He is alert and oriented to person, place, and time.     Cranial Nerves: No cranial nerve deficit.  Psychiatric:        Mood and Affect: Mood normal.        Behavior: Behavior normal.           Assessment & Plan:

## 2023-01-13 NOTE — Patient Instructions (Addendum)
Follow up in 3-4 months to recheck BP, cholesterol, and sugar We'll notify you of your lab results and make any changes if needed Continue to work on low carb/low sugar diet and regular exercise Call with any questions or concerns GOOD LUCK!

## 2023-01-14 ENCOUNTER — Telehealth: Payer: Self-pay

## 2023-01-14 NOTE — Telephone Encounter (Signed)
-----   Message from Sheliah Hatch, MD sent at 01/14/2023  7:17 AM EDT ----- A1C is stable at 7.4%  No med changes at this time

## 2023-01-18 ENCOUNTER — Telehealth: Payer: Self-pay

## 2023-01-18 NOTE — Telephone Encounter (Signed)
I called him and scheduled him a follow up appointment.

## 2023-01-20 ENCOUNTER — Ambulatory Visit: Payer: PPO | Admitting: Internal Medicine

## 2023-01-26 ENCOUNTER — Telehealth (HOSPITAL_COMMUNITY): Payer: Self-pay | Admitting: Pharmacy Technician

## 2023-01-26 ENCOUNTER — Other Ambulatory Visit (HOSPITAL_COMMUNITY): Payer: Self-pay

## 2023-01-26 ENCOUNTER — Other Ambulatory Visit (HOSPITAL_COMMUNITY): Payer: Self-pay | Admitting: Cardiology

## 2023-01-26 ENCOUNTER — Encounter (HOSPITAL_COMMUNITY): Payer: Self-pay | Admitting: Cardiology

## 2023-01-26 MED ORDER — EMPAGLIFLOZIN 10 MG PO TABS
10.0000 mg | ORAL_TABLET | Freq: Every day | ORAL | 0 refills | Status: DC
  Filled 2023-01-26 – 2023-01-27 (×2): qty 90, 90d supply, fill #0

## 2023-01-26 NOTE — Telephone Encounter (Signed)
Advanced Heart Failure Patient Advocate Encounter  Patient reached out regarding grant renewal and help with Eliquis. Unfortunately, he is over the income limit for Eliquis assistance and there is no grant available. Can offer samples, if needed.  The patient was approved for a [employer] grant that will help cover the cost of Jardiance, Metoprolol and Losartan. Total amount awarded, $10,000. Eligibility, 12/27/22 - 12/26/23.  ID [id number]  BIN [id number]  PCN [id number]  Group [id number]

## 2023-01-27 ENCOUNTER — Other Ambulatory Visit (HOSPITAL_COMMUNITY): Payer: Self-pay

## 2023-01-27 ENCOUNTER — Other Ambulatory Visit (HOSPITAL_BASED_OUTPATIENT_CLINIC_OR_DEPARTMENT_OTHER): Payer: Self-pay

## 2023-01-27 ENCOUNTER — Other Ambulatory Visit (HOSPITAL_COMMUNITY): Payer: Self-pay | Admitting: Cardiology

## 2023-01-27 ENCOUNTER — Other Ambulatory Visit: Payer: Self-pay

## 2023-01-27 ENCOUNTER — Telehealth (HOSPITAL_COMMUNITY): Payer: Self-pay

## 2023-01-27 MED ORDER — LOSARTAN POTASSIUM 25 MG PO TABS
12.5000 mg | ORAL_TABLET | Freq: Every day | ORAL | 0 refills | Status: DC
  Filled 2023-01-27: qty 45, 90d supply, fill #0

## 2023-01-27 MED ORDER — METOPROLOL SUCCINATE ER 100 MG PO TB24
100.0000 mg | ORAL_TABLET | Freq: Two times a day (BID) | ORAL | 0 refills | Status: DC
  Filled 2023-01-27: qty 180, 90d supply, fill #0

## 2023-01-27 NOTE — Telephone Encounter (Signed)
Advanced Heart Failure Patient Advocate Encounter  Medication Samples have been left at registration desk for patient pick up. Drug name: Eliquis 5 MG Qty: 3x 14 ct packages LOT: [id number] Exp.: 05/2024 SIG: Take 1 tablet by mouth twice daily   The patient has been instructed regarding the correct time, dose, and frequency of taking this medication, including desired effects and most common side effects.   Burnell Blanks, CPhT Rx Patient Advocate Phone: [phone]

## 2023-02-10 ENCOUNTER — Ambulatory Visit (INDEPENDENT_AMBULATORY_CARE_PROVIDER_SITE_OTHER): Payer: PPO | Admitting: Dermatology

## 2023-02-10 ENCOUNTER — Encounter: Payer: Self-pay | Admitting: Dermatology

## 2023-02-10 VITALS — BP 114/71 | HR 53

## 2023-02-10 DIAGNOSIS — L570 Actinic keratosis: Secondary | ICD-10-CM | POA: Diagnosis not present

## 2023-02-10 DIAGNOSIS — C44622 Squamous cell carcinoma of skin of right upper limb, including shoulder: Secondary | ICD-10-CM | POA: Diagnosis not present

## 2023-02-10 DIAGNOSIS — D485 Neoplasm of uncertain behavior of skin: Secondary | ICD-10-CM

## 2023-02-10 DIAGNOSIS — C4492 Squamous cell carcinoma of skin, unspecified: Secondary | ICD-10-CM

## 2023-02-10 HISTORY — DX: Squamous cell carcinoma of skin, unspecified: C44.92

## 2023-02-10 NOTE — Patient Instructions (Addendum)
Thank you for visiting our clinic today. We appreciate your commitment to addressing your skin health concerns promptly.  Here is a summary of the key instructions and next steps from today's appointment:  - Biopsy Procedure: We performed a biopsy on the 7 mm pink-crusted papule on your right side, suspected to be squamous cell carcinoma. The sample has been sent to the lab for confirmation.  - Post-Biopsy Care: Please keep the biopsy site clean. It is okay to get it wet in the shower. Apply a generous amount of Vaseline and cover with a bandage.  - Follow-Up: We will contact you with the biopsy results. If it confirms skin cancer, we will schedule a surgery day to excise the area with appropriate margins to ensure complete removal.  - Additional Treatment: We also treated a spot on your right forearm diagnosed as actinic keratosis with liquid nitrogen to prevent it from developing further.  Please feel free to reach out if you have any questions or concerns while you await your results. We are here to support you every step of the way.      Patient Handout: Wound Care for Skin Biopsy Site  Patient Handout: Wound Care for Skin Biopsy Site  Taking Care of Your Skin Biopsy Site  Proper care of the biopsy site is essential for promoting healing and minimizing scarring. This handout provides instructions on how to care for your biopsy site to ensure optimal recovery.  1. Cleaning the Wound:  Clean the biopsy site daily with gentle soap and water. Gently pat the area dry with a clean, soft towel. Avoid harsh scrubbing or rubbing the area, as this can irritate the skin and delay healing.  2. Applying Aquaphor and Bandage:  After cleaning the wound, apply a thin layer of Aquaphor ointment to the biopsy site. Cover the area with a sterile bandage to protect it from dirt, bacteria, and friction. Change the bandage daily or as needed if it becomes soiled or wet.  3. Continued Care for One  Week:  Repeat the cleaning, Aquaphor application, and bandaging process daily for one week following the biopsy procedure. Keeping the wound clean and moist during this initial healing period will help prevent infection and promote optimal healing.  4. Massaging Aquaphor into the Area:  ---After one week, discontinue the use of bandages but continue to apply Aquaphor to the biopsy site. ----Gently massage the Aquaphor into the area using circular motions. ---Massaging the skin helps to promote circulation and prevent the formation of scar tissue.   Additional Tips:  Avoid exposing the biopsy site to direct sunlight during the healing process, as this can cause hyperpigmentation or worsen scarring. If you experience any signs of infection, such as increased redness, swelling, warmth, or drainage from the wound, contact your healthcare provider immediately. Follow any additional instructions provided by your healthcare provider for caring for the biopsy site and managing any discomfort. Conclusion:  Taking proper care of your skin biopsy site is crucial for ensuring optimal healing and minimizing scarring. By following these instructions for cleaning, applying Aquaphor, and massaging the area, you can promote a smooth and successful recovery. If you have any questions or concerns about caring for your biopsy site, don't hesitate to contact your healthcare provider for guidance.    Cryotherapy Aftercare  Wash gently with soap and water everyday.   Apply Vaseline and Band-Aid daily until healed.  Marland Kitchenjdw

## 2023-02-10 NOTE — Progress Notes (Signed)
   Follow-Up Visit   Subjective  [name] [name] is a 72 y.o. male who presents for the following: spot on right forearm for about 1 month. It itched and was painful when it first appeared but currently has no symptoms. Pt has never had a skin cancer.   The following portions of the chart were reviewed this encounter and updated as appropriate: medications, allergies, medical history  Review of Systems:  No other skin or systemic complaints except as noted in HPI or Assessment and Plan.  Objective  Well appearing patient in no apparent distress; mood and affect are within normal limits.   A focused examination was performed of the following areas: Right forearm  Relevant exam findings are noted in the Assessment and Plan.  Right Forearm - Posterior 7mm pink crusted papule         Assessment & Plan   1. Suspected Squamous Cell Carcinoma (SCC) on the right side (Neoplasm of Uncertain Behavior) - Assessment: 7mm pink-crusted papule observed. - Plan:   a. Perform shave biopsy to confirm diagnosis.   b. Instruct patient to keep the area clean, apply Vaseline, and cover with a band-aid.   c. Await lab results to confirm diagnosis.   d. If confirmed as SCC, schedule patient for surgical excision with appropriate margins and sutures.  2. Actinic Keratosis on the right forearm - Assessment: Pink hyperkeratotic papule observed. - Plan:   a. Treat with liquid nitrogen (cryotherapy) to prevent progression to skin cancer.   b. Monitor for any changes or new lesions during follow-up visits.  3. Follow-up - Plan: Patient to be contacted with biopsy results. Schedule follow-up appointment as needed based on biopsy results and treatment plan  Neoplasm of uncertain behavior of skin Right Forearm - Posterior  Skin / nail biopsy Type of biopsy: tangential   Informed consent: discussed and consent obtained   Timeout: patient name, date of birth, surgical site, and procedure verified    Procedure prep:  Patient was prepped and draped in usual sterile fashion Prep type:  Isopropyl alcohol Anesthesia: the lesion was anesthetized in a standard fashion   Anesthetic:  1% lidocaine w/ epinephrine 1-100,000 buffered w/ 8.4% NaHCO3 Instrument used: DermaBlade   Hemostasis achieved with: aluminum chloride   Outcome: patient tolerated procedure well   Post-procedure details: sterile dressing applied and wound care instructions given   Dressing type: petrolatum gauze and bandage      No follow-ups on file.  Owens Shark, CMA, am acting as scribe for [employer], DO.   Documentation: I have reviewed the above documentation for accuracy and completeness, and I agree with the above.  Langston Reusing, DO

## 2023-02-12 ENCOUNTER — Telehealth (HOSPITAL_COMMUNITY): Payer: Self-pay

## 2023-02-12 NOTE — Telephone Encounter (Signed)
Received a fax requesting medical records from [facility](Anesthesia). Records were successfully faxed to: [phone] ,which was the number provided.  Medical request form will be scanned into patients chart.

## 2023-02-17 DIAGNOSIS — R1310 Dysphagia, unspecified: Secondary | ICD-10-CM | POA: Diagnosis not present

## 2023-02-17 DIAGNOSIS — I509 Heart failure, unspecified: Secondary | ICD-10-CM | POA: Diagnosis not present

## 2023-02-17 DIAGNOSIS — J392 Other diseases of pharynx: Secondary | ICD-10-CM | POA: Diagnosis not present

## 2023-02-17 DIAGNOSIS — E119 Type 2 diabetes mellitus without complications: Secondary | ICD-10-CM | POA: Diagnosis not present

## 2023-02-17 DIAGNOSIS — E7800 Pure hypercholesterolemia, unspecified: Secondary | ICD-10-CM | POA: Diagnosis not present

## 2023-02-17 DIAGNOSIS — G4733 Obstructive sleep apnea (adult) (pediatric): Secondary | ICD-10-CM | POA: Diagnosis not present

## 2023-02-17 DIAGNOSIS — K219 Gastro-esophageal reflux disease without esophagitis: Secondary | ICD-10-CM | POA: Diagnosis not present

## 2023-02-17 DIAGNOSIS — I4891 Unspecified atrial fibrillation: Secondary | ICD-10-CM | POA: Diagnosis not present

## 2023-02-17 DIAGNOSIS — E785 Hyperlipidemia, unspecified: Secondary | ICD-10-CM | POA: Diagnosis not present

## 2023-02-17 DIAGNOSIS — I110 Hypertensive heart disease with heart failure: Secondary | ICD-10-CM | POA: Diagnosis not present

## 2023-02-17 DIAGNOSIS — Z79899 Other long term (current) drug therapy: Secondary | ICD-10-CM | POA: Diagnosis not present

## 2023-02-17 NOTE — Progress Notes (Signed)
Please call pt and notify their bx results were positive for a skin CA that will be excised by Dr. Onalee Hua  Please schedule 30 min surgery

## 2023-02-18 ENCOUNTER — Telehealth: Payer: Self-pay

## 2023-02-18 NOTE — Telephone Encounter (Signed)
-----   Message from Langston Reusing sent at 02/17/2023 11:07 AM EDT ----- Please call pt and notify their bx results were positive for a skin CA that will be excised by Dr. Onalee Hua  Please schedule 30 min surgery

## 2023-02-18 NOTE — Telephone Encounter (Signed)
I spoke to him and gave results. I scheduled a surgical appointment for him.

## 2023-02-22 ENCOUNTER — Encounter: Payer: Self-pay | Admitting: Family Medicine

## 2023-02-22 ENCOUNTER — Other Ambulatory Visit: Payer: Self-pay

## 2023-02-22 DIAGNOSIS — K219 Gastro-esophageal reflux disease without esophagitis: Secondary | ICD-10-CM

## 2023-02-22 MED ORDER — PANTOPRAZOLE SODIUM 40 MG PO TBEC
40.0000 mg | DELAYED_RELEASE_TABLET | Freq: Every day | ORAL | 1 refills | Status: DC
Start: 2023-02-22 — End: 2023-03-19

## 2023-02-26 DIAGNOSIS — R1314 Dysphagia, pharyngoesophageal phase: Secondary | ICD-10-CM | POA: Diagnosis not present

## 2023-02-26 DIAGNOSIS — R1310 Dysphagia, unspecified: Secondary | ICD-10-CM | POA: Diagnosis not present

## 2023-02-26 DIAGNOSIS — K224 Dyskinesia of esophagus: Secondary | ICD-10-CM | POA: Diagnosis not present

## 2023-03-13 ENCOUNTER — Encounter (HOSPITAL_COMMUNITY): Payer: Self-pay | Admitting: Cardiology

## 2023-03-16 ENCOUNTER — Encounter: Payer: Self-pay | Admitting: Gastroenterology

## 2023-03-16 ENCOUNTER — Other Ambulatory Visit: Payer: Self-pay | Admitting: Thoracic Surgery (Cardiothoracic Vascular Surgery)

## 2023-03-16 DIAGNOSIS — I7121 Aneurysm of the ascending aorta, without rupture: Secondary | ICD-10-CM

## 2023-03-17 NOTE — Progress Notes (Signed)
[name] [name]    [id number]    1950/08/23  Primary Care Physician:Tabori, Helane Rima, MD  Referring Physician: Sheliah Hatch, MD [address] [place],  [place] [address]   Chief complaint:   Chief Complaint  Patient presents with   Dysphagia    Patient reports his symptoms are better since dilation but not resolved. Denies any other new symptoms.    HPI: 73 year old very pleasant gentleman is here for evaluation and management of persistent dysphagia and regurgitation.    Last seen on 11-09-22. Here for a f/u for oropharyngeal dysphagia.   Today, he states that his dysphagia symptoms have improved by 50% but have not been completely resolved. He continues to experience dysphagia symptoms every now and then. His symptoms can be triggered by food particles, water, or even saliva. He states trying to clear up the particles stuck in his throat with water doesn't help as the water tends to stay in place. He states that he mostly experience his symptoms at night time and mostly during dinner. He usually takes Peppermint cough drops  to help relief his symptoms.      He also reports that he constantly has to clear his throat of phylum. His dysphagia symptoms tend to be less frequent when not having to clear his throat   He states that he underwent proximal esophageal dilation by ENT at [facility] but denies having a Botox treatment done. He also denies undergoing any speech or swallowing therapy.   Patient denies diarrhea, constipation, nausea, blood in stool, black stool, vomiting, abdominal pain, bloating, unintentional weight loss, reflux.  GI Hx:  s/p repair of esophageal diverticulum  Esophageal manometry September 23, 2022: No esophageal dysmotility, normal relaxation of the lower esophageal sphincter. Increased upper esophageal residual pressure suggestive of cricopharyngeal achalasia   ESOPHAGUS/BARIUM SWALLOW/TABLET STUDY 03-31-22 No evidence of esophageal  leak.   -Removal of Diverticulum on the esophagus 03-26-23 ESOPHAGUS/BARIUM SWALLOW/TABLET STUDY 01-20-22 1. Mild dysmotility. 2. Moderate mid esophageal diverticulum arising from the RIGHT lateral soft Gus. 3. No signs of narrowing or gross lesion on single contrast evaluation. 4. No signs of gastroesophageal reflux or hiatal hernia.  CT Angio chest w contrast 10-08-22 1. Stable appearance of the irregular aortic root with focal aneurysmal dilation of the left coronary sinus. The outpouching measures approximately 2.3 x 2.1 cm on the axial images in the overall aortic root measures unchanged at 5.6 cm in diameter including the aneurysmal left coronary sinus. 2. Evidence of prior tube graft repair of the ascending thoracic aorta with residual aneurysmal dilation of the ascending aorta proximal to the repair at 4.5 cm. This is unchanged compared to prior. 3. Sequelae of old granulomatous disease. 4. Cholelithiasis. 5. Nonobstructing left renal stone. 6. Colonic diverticular disease without CT evidence of active inflammation.   Colonoscopy 05-14-14 diverticculosis   Colonoscopy 05-07-14 Normal   EGD 03-29-03 -Hiatal Hernia -GERD -Esophageal Stricture    Current Outpatient Medications:    apixaban (ELIQUIS) 5 MG TABS tablet, Take 1 tablet (5 mg total) by mouth 2 (two) times daily., Disp: 180 tablet, Rfl: 3   azelastine (ASTELIN) 0.1 % nasal spray, Place 2 sprays into both nostrils 2 (two) times daily., Disp: 30 mL, Rfl: 12   cetirizine (ZYRTEC) 10 MG tablet, TAKE 1 TABLET(10 MG) BY MOUTH DAILY, Disp: 90 tablet, Rfl: 0   empagliflozin (JARDIANCE) 10 MG TABS tablet, Take 1 tablet (10 mg total) by mouth  daily before breakfast. NEEDS FOLLOW UP APPOINTMENT FOR MORE REFILLS, Disp: 90 tablet, Rfl: 0   famotidine (PEPCID) 40 MG tablet, Take 40 mg by mouth 2 (two) times daily., Disp: , Rfl:    finasteride (PROSCAR) 5 MG tablet, TAKE 1 TABLET(5 MG) BY MOUTH DAILY, Disp: 90 tablet, Rfl: 1    fluticasone (FLONASE) 50 MCG/ACT nasal spray, Place 2 sprays into both nostrils as needed for allergies or rhinitis., Disp: 50 mL, Rfl: 30   furosemide (LASIX) 20 MG tablet, TAKE 1 TABLET(20 MG) BY MOUTH EVERY OTHER DAY, Disp: 15 tablet, Rfl: 11   hydrocortisone 2.5 % cream, Apply twice daily to affected areas as needed for rash/itching up to 2 weeks, Disp: 30 g, Rfl: 2   ipratropium (ATROVENT) 0.06 % nasal spray, Place 2 sprays into both nostrils 4 (four) times daily., Disp: 15 mL, Rfl: 12   losartan (COZAAR) 25 MG tablet, Take 0.5 tablets (12.5 mg total) by mouth daily., Disp: 45 tablet, Rfl: 0   metoprolol succinate (TOPROL-XL) 100 MG 24 hr tablet, TAKE 1 TABLET(100 MG) BY MOUTH TWICE DAILY, Disp: 180 tablet, Rfl: 0   montelukast (SINGULAIR) 10 MG tablet, TAKE 1 TABLET(10 MG) BY MOUTH AT BEDTIME, Disp: 30 tablet, Rfl: 6   pantoprazole (PROTONIX) 40 MG tablet, Take 1 tablet (40 mg total) by mouth daily., Disp: 90 tablet, Rfl: 1   rosuvastatin (CRESTOR) 40 MG tablet, TAKE 1 TABLET(40 MG) BY MOUTH DAILY, Disp: 90 tablet, Rfl: 3   spironolactone (ALDACTONE) 25 MG tablet, TAKE 1 TABLET(25 MG) BY MOUTH DAILY, Disp: 90 tablet, Rfl: 3   Allergies as of 03/19/2023 - Review Complete 03/19/2023  Allergen Reaction Noted   Penicillins Hives and Swelling 08/08/2008   Quinidine Other (See Comments) 10/20/2012    Past Medical History:  Diagnosis Date   Acute gastritis    Aortic insufficiency    s/p Ross Procedure: AVR/Pulmonary autograft in aortic position & PVR with bioprosthetic PV 1994 in [place]   Aortic root aneurysm (HCC)    Atrial flutter (HCC)    history- no atrial flutter since cardioversion   CHF (congestive heart failure) (HCC)    Diabetes mellitus without complication (HCC)    type 2   Early satiety    Esophageal stricture    GERD (gastroesophageal reflux disease)    Headache syndrome 10/18/2018   Hiatal hernia    Hyperlipidemia    Neuropathy    left brachial plexus   Pain,  dental    SCC (squamous cell carcinoma) 02/10/2023   right forearm   Sleep apnea    09/12/20 AHI 35.2/hr. Does not use CPAP. (03/26/22)    Past Surgical History:  Procedure Laterality Date   AORTIC VALVE REPLACEMENT  1994   CARDIOVERSION N/A 03/14/2020   Procedure: CARDIOVERSION;  Surgeon: Laurey Morale, MD;  Location: [facility] ENDOSCOPY;  Service: Cardiovascular;  Laterality: N/A;   ESOPHAGEAL MANOMETRY N/A 09/23/2022   Procedure: ESOPHAGEAL MANOMETRY (EM);  Surgeon: Napoleon Form, MD;  Location: [facility] ENDOSCOPY;  Service: Gastroenterology;  Laterality: N/A;   ESOPHAGOGASTRODUODENOSCOPY N/A 03/30/2022   Procedure: ESOPHAGOGASTRODUODENOSCOPY (EGD);  Surgeon: Corliss Skains, MD;  Location: [facility] OR;  Service: Thoracic;  Laterality: N/A;   GANGLION CYST EXCISION  JAN 2002   HIATAL HERNIA REPAIR  DEC 2002   INTERCOSTAL NERVE BLOCK Right 03/30/2022   Procedure: INTERCOSTAL NERVE BLOCK;  Surgeon: Corliss Skains, MD;  Location: [facility] OR;  Service: Thoracic;  Laterality: Right;   TEE WITHOUT CARDIOVERSION N/A 03/14/2020  Procedure: TRANSESOPHAGEAL ECHOCARDIOGRAM (TEE);  Surgeon: Laurey Morale, MD;  Location: [facility] ENDOSCOPY;  Service: Cardiovascular;  Laterality: N/A;   TONSILLECTOMY     VASECTOMY  DEC 2002    Family History  Problem Relation Age of Onset   Lung cancer Father        hx of smoking    Leukemia Brother        Passed at age of 71   Hypertension Other    Prostate cancer Other    Colon cancer Neg Hx    Pancreatic cancer Neg Hx    Rectal cancer Neg Hx    Stomach cancer Neg Hx    Asthma Neg Hx    Immunodeficiency Neg Hx    Eczema Neg Hx    Atopy Neg Hx    Urticaria Neg Hx    Allergic rhinitis Neg Hx    Angioedema Neg Hx     Social History   Socioeconomic History   Marital status: Married    Spouse name: Natalia Leatherwood    Number of children: 3   Years of education: Not on file   Highest education level: Master's degree (e.g., MA, MS, MEng, MEd, MSW, MBA)   Occupational History   Occupation: retired  Tobacco Use   Smoking status: Never    Passive exposure: Current (wife smokes in car)   Smokeless tobacco: Never  Vaping Use   Vaping status: Never Used  Substance and Sexual Activity   Alcohol use: No   Drug use: No   Sexual activity: Not on file  Other Topics Concern   Not on file  Social History Narrative   Caffeine 1-2 cups daily    Right handed   Live in [place] with spouse Natalia Leatherwood    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   Social Determinants of Health   Financial Resource Strain: Medium Risk (01/09/2023)   Overall Financial Resource Strain (CARDIA)    Difficulty of Paying Living Expenses: Somewhat hard  Food Insecurity: No Food Insecurity (01/09/2023)   Hunger Vital Sign    Worried About Running Out of Food in the Last Year: Never true    Ran Out of Food in the Last Year: Never true  Transportation Needs: No Transportation Needs (01/09/2023)   PRAPARE - Administrator, Civil Service (Medical): No    Lack of Transportation (Non-Medical): No  Physical Activity: Insufficiently Active (01/09/2023)   Exercise Vital Sign    Days of Exercise per Week: 3 days    Minutes of Exercise per Session: 30 min  Stress: No Stress Concern Present (01/09/2023)   [employer] of Occupational Health - Occupational Stress Questionnaire    Feeling of Stress : Not at all  Social Connections: Moderately Integrated (01/09/2023)   Social Connection and Isolation Panel [NHANES]    Frequency of Communication with Friends and Family: More than three times a week    Frequency of Social Gatherings with Friends and Family: Once a week    Attends Religious Services: More than 4 times per year    Active Member of [employer] or Organizations: No    Attends Banker Meetings: Never    Marital Status: Married  Catering manager Violence: Not At Risk (09/09/2022)   Humiliation, Afraid, Rape, and Kick questionnaire    Fear of  Current or Ex-Partner: No    Emotionally Abused: No    Physically Abused: No    Sexually Abused: No    Review of systems: Review  of Systems  Constitutional:  Negative for unexpected weight change.  HENT:  Positive for trouble swallowing.   Gastrointestinal:  Negative for abdominal distention, abdominal pain, anal bleeding, blood in stool, constipation, diarrhea, nausea, rectal pain and vomiting.    Physical Exam: Vitals:   03/19/23 1355  BP: 124/86  Pulse: 74    Body mass index is 26.87 kg/m.  General: well-appearing   Eyes: sclera anicteric, no redness ENT: oral mucosa moist without lesions, no cervical or supraclavicular lymphadenopathy CV: RRR, no JVD, no peripheral edema Resp: clear to auscultation bilaterally, normal RR and effort noted GI: soft, no tenderness, with active bowel sounds. No guarding or palpable organomegaly noted. Skin; warm and dry, no rash or jaundice noted Neuro: awake, alert and oriented x 3. Normal gross motor function and fluent speech  Data Reviewed:  Reviewed labs, radiology imaging, old records and pertinent past GI work up   Assessment and Plan/Recommendations: 72 year old very pleasant gentleman with complaints of dysphagia, hoarseness, sore throat, constant gurgling and regurgitation Esophageal manometry is suggestive of cricopharyngeal achalasia S/p dilatation of upper esophageal sphincter by ENT.  He also has history of laryngospasm He does not have any typical symptoms of GERD, but advised him to continue daily pantoprazole before breakfast and Pepcid at bedtime as needed Continue peppermint cough drops and peppermint oil 1 to 2 drops mixed in 2 ounce of warm liquid or peppermint Altoid to improve smooth muscle relaxation/esophageal spasms Advised patient to avoid any ice cold beverages, drink warm soup or tea along with his meals  Will plan to proceed with EGD to exclude lower esophageal stricture, plan for further esophageal dilation  as he has persistent dysphagia The risks and benefits as well as alternatives of endoscopic procedure(s) have been discussed and reviewed. All questions answered. The patient agrees to proceed.   The patient was provided an opportunity to ask questions and all were answered. The patient agreed with the plan and demonstrated an understanding of the instructions.  Iona Beard , MD  CC: Sheliah Hatch, MD   Ladona Mow Hewitt Shorts as a scribe for Marsa Aris, MD.,have documented all relevant documentation on the behalf of Marsa Aris, MD,as directed by  Marsa Aris, MD while in the presence of Marsa Aris, MD.   I, Marsa Aris, MD, have reviewed all documentation for this visit. The documentation on 03/19/23 for the exam, diagnosis, procedures, and orders are all accurate and complete.

## 2023-03-18 ENCOUNTER — Other Ambulatory Visit (HOSPITAL_COMMUNITY): Payer: Self-pay | Admitting: Cardiology

## 2023-03-18 ENCOUNTER — Encounter (INDEPENDENT_AMBULATORY_CARE_PROVIDER_SITE_OTHER): Payer: Self-pay

## 2023-03-19 ENCOUNTER — Encounter: Payer: Self-pay | Admitting: Gastroenterology

## 2023-03-19 ENCOUNTER — Ambulatory Visit: Payer: PPO | Admitting: Gastroenterology

## 2023-03-19 VITALS — BP 124/86 | HR 74 | Ht 75.0 in | Wt 215.0 lb

## 2023-03-19 DIAGNOSIS — R1312 Dysphagia, oropharyngeal phase: Secondary | ICD-10-CM

## 2023-03-19 DIAGNOSIS — K219 Gastro-esophageal reflux disease without esophagitis: Secondary | ICD-10-CM | POA: Diagnosis not present

## 2023-03-19 DIAGNOSIS — R1310 Dysphagia, unspecified: Secondary | ICD-10-CM

## 2023-03-19 MED ORDER — PANTOPRAZOLE SODIUM 40 MG PO TBEC
40.0000 mg | DELAYED_RELEASE_TABLET | Freq: Every day | ORAL | 1 refills | Status: DC
Start: 2023-03-19 — End: 2024-02-07

## 2023-03-19 NOTE — Patient Instructions (Addendum)
You have been scheduled for an endoscopy. Please follow written instructions given to you at your visit today.  If you use inhalers (even only as needed), please bring them with you on the day of your procedure.  If you take any of the following medications, they will need to be adjusted prior to your procedure:   DO NOT TAKE 7 DAYS PRIOR TO TEST- Trulicity (dulaglutide) Ozempic, Wegovy (semaglutide) Mounjaro (tirzepatide) Bydureon Bcise (exanatide extended release)  DO NOT TAKE 1 DAY PRIOR TO YOUR TEST Rybelsus (semaglutide) Adlyxin (lixisenatide) Victoza (liraglutide) Byetta (exanatide) ___________________________________________________________________________     We have sent the following medications to your pharmacy for you to pick up at your convenience: Pantoprazole  Drink Warm beverages with meals  We will refer you to speech and swallow therapy  Due to recent changes in healthcare laws, you may see the results of your imaging and laboratory studies on MyChart before your provider has had a chance to review them.  We understand that in some cases there may be results that are confusing or concerning to you. Not all laboratory results come back in the same time frame and the provider may be waiting for multiple results in order to interpret others.  Please give [place] 48 hours in order for your provider to thoroughly review all the results before contacting the office for clarification of your results.    I appreciate the  opportunity to care for you  Thank You   Marsa Aris , MD

## 2023-03-22 ENCOUNTER — Telehealth: Payer: Self-pay

## 2023-03-22 ENCOUNTER — Encounter: Payer: Self-pay | Admitting: Gastroenterology

## 2023-03-22 ENCOUNTER — Encounter (HOSPITAL_COMMUNITY): Payer: Self-pay | Admitting: Cardiology

## 2023-03-22 ENCOUNTER — Telehealth: Payer: Self-pay | Admitting: *Deleted

## 2023-03-22 NOTE — Telephone Encounter (Signed)
   Name: [name] [name]  DOB: 1951-07-21  MRN: [id number]  Primary Cardiologist: Marca Ancona, MD   Preoperative team, please contact this patient and set up a phone call appointment for further preoperative risk assessment. Please obtain consent and complete medication review. Thank you for your help.  I confirm that guidance regarding antiplatelet and oral anticoagulation therapy has been completed and, if necessary, noted below.  Per office protocol, patient can hold Eliquis for 2 days prior to procedure.    Ronney Asters, NP 03/22/2023, 10:58 AM [facility]

## 2023-03-22 NOTE — Telephone Encounter (Signed)
Pt is scheduled for 08/23 at 10:40am. Med rec and consent done

## 2023-03-22 NOTE — Telephone Encounter (Signed)
[facility] Pre-operative Risk Assessment     Request for surgical clearance:     Endoscopy Procedure  What type of surgery is being performed?     Upper Endoscopy  When is this surgery scheduled?     03/29/2023  What type of clearance is required ?   Pharmacy  Are there any medications that need to be held prior to surgery and how long? Requesting 2 days  Practice name and name of physician performing surgery?      [facility]  Philbert Riser Nandigam,MD  What is your office phone and fax number?      Phone- [phone]  Fax- [phone]  Anesthesia type (None, local, MAC, general) ?       MAC

## 2023-03-22 NOTE — Telephone Encounter (Signed)
Patient with diagnosis of A flutter on Eliquis for anticoagulation.    Procedure: upper endoscopy Date of procedure: 03/29/23   CHA2DS2-VASc Score = 4  This indicates a 4.8% annual risk of stroke. The patient's score is based upon: CHF History: 0 HTN History: 1 Diabetes History: 1 Stroke History: 0 Vascular Disease History: 1 Age Score: 1 Gender Score: 0   CrCl 113 mL/min Platelet count 290K  Patient does require pre-op antibiotics for dental procedure.  Per office protocol, patient can hold Eliquis for 2 days prior to procedure.   **This guidance is not considered finalized until pre-operative APP has relayed final recommendations.**

## 2023-03-22 NOTE — Telephone Encounter (Signed)
Pt is scheduled for 08/23 at 10:40am. Med rec and consent done    Patient Consent for Virtual Visit        [name] [name] has provided verbal consent on 03/22/2023 for a virtual visit (video or telephone).   CONSENT FOR VIRTUAL VISIT FOR:  [name] [name]  By participating in this virtual visit I agree to the following:  I hereby voluntarily request, consent and authorize [facility] and its employed or contracted physicians, physician assistants, nurse practitioners or other licensed health care professionals (the Practitioner), to provide me with telemedicine health care services (the "Services") as deemed necessary by the treating Practitioner. I acknowledge and consent to receive the Services by the Practitioner via telemedicine. I understand that the telemedicine visit will involve communicating with the Practitioner through live audiovisual communication technology and the disclosure of certain medical information by electronic transmission. I acknowledge that I have been given the opportunity to request an in-person assessment or other available alternative prior to the telemedicine visit and am voluntarily participating in the telemedicine visit.  I understand that I have the right to withhold or withdraw my consent to the use of telemedicine in the course of my care at any time, without affecting my right to future care or treatment, and that the Practitioner or I may terminate the telemedicine visit at any time. I understand that I have the right to inspect all information obtained and/or recorded in the course of the telemedicine visit and may receive copies of available information for a reasonable fee.  I understand that some of the potential risks of receiving the Services via telemedicine include:  Delay or interruption in medical evaluation due to technological equipment failure or disruption; Information transmitted may not be sufficient (e.g. poor resolution of images)  to allow for appropriate medical decision making by the Practitioner; and/or  In rare instances, security protocols could fail, causing a breach of personal health information.  Furthermore, I acknowledge that it is my responsibility to provide information about my medical history, conditions and care that is complete and accurate to the best of my ability. I acknowledge that Practitioner's advice, recommendations, and/or decision may be based on factors not within their control, such as incomplete or inaccurate data provided by me or distortions of diagnostic images or specimens that may result from electronic transmissions. I understand that the practice of medicine is not an exact science and that Practitioner makes no warranties or guarantees regarding treatment outcomes. I acknowledge that a copy of this consent can be made available to me via my patient portal [facility] MyChart), or I can request a printed copy by calling the office of [facility].    I understand that my insurance will be billed for this visit.   I have read or had this consent read to me. I understand the contents of this consent, which adequately explains the benefits and risks of the Services being provided via telemedicine.  I have been provided ample opportunity to ask questions regarding this consent and the Services and have had my questions answered to my satisfaction. I give my informed consent for the services to be provided through the use of telemedicine in my medical care

## 2023-03-22 NOTE — Telephone Encounter (Addendum)
I contacted the patient and he said he doesn't know yet until Friday if he can hold Eliquis 2 days. He is going to call the cardiology office and see if they can see him any sooner. Patients Endoscopy is 8/26. Eliquis only needs to be held two days.

## 2023-03-22 NOTE — Telephone Encounter (Signed)
Preop app said ok to add on calendar for the 23rd. I lvmtrc.

## 2023-03-23 ENCOUNTER — Telehealth: Payer: Self-pay | Admitting: Cardiology

## 2023-03-23 NOTE — Telephone Encounter (Signed)
I called the pt back who had asked if he could move his appt up sooner. I stated that we did ot have anything sooner. Pt's concern was if he was going to have enough time to hold his blood thinner Eliquis. I assured the pt that he will as I am reading the recommendations are that he will hold Eliquis x 2 days prior, which means he will hold Sat and Sun, procedure on Monday 03/29/23. Pt aware will resume once Dr. Lavon Paganini feels it is safe post procedure. I assured the pt that we will go over all of this with him on Friday 03/26/23. Pt thanked me for the call and said he feels better about the call for Friday 03/26/23.           Copy Bolick, Windy Fast   Telephone Encounter Signed   Creation Time: 03/23/2023  9:10 AM   Signed     Pt would like to move up his pre-op telephone visit. Please advise

## 2023-03-23 NOTE — Telephone Encounter (Signed)
Pt would like to move up his pre-op telephone visit. Please advise

## 2023-03-23 NOTE — Telephone Encounter (Signed)
I called the pt back who had asked if he could move his appt up sooner. I stated that we did ot have anything sooner. Pt's concern was if he was going to have enough time to hold his blood thinner Eliquis. I assured the pt that he will as I am reading the recommendations are that he will hold Eliquis x 2 days prior, which means he will hold Sat and Sun, procedure on Monday 03/29/23. Pt aware will resume once Dr. Lavon Paganini feels it is safe post procedure. I assured the pt that we will go over all of this with him on Friday 03/26/23. Pt thanked me for the call and said he feels better about the call for Friday 03/26/23.

## 2023-03-24 ENCOUNTER — Telehealth: Payer: Self-pay | Admitting: *Deleted

## 2023-03-24 ENCOUNTER — Encounter: Payer: Self-pay | Admitting: Gastroenterology

## 2023-03-24 NOTE — Telephone Encounter (Signed)
Called patient to see what he was referring to in reference to the televisit. Patient informed the nurse that he was referring to his televisit with his cardiologist. Patient was also concerned that the 3 days of being off the blood thinner prior to the colonoscopy would not be enough time before having the procedure done. Information sent in the chart via the cardiologist's CMA states that it would. Patient's colonoscopy procedure is due on 03/29/23; his hold dates for the blood thinner is 03/27/23,03/28/23 and 03/29/23. Please advise.

## 2023-03-24 NOTE — Telephone Encounter (Signed)
Called the patient to inform him that the hold on the Eliquis is only for 8/25 and 8/26, that he able to take the medication on 03/27/23. Reminder send to Alona Bene and Bonita Quin stating the Hold for Eliquis is for these 2 days and to insure we receive the permission from his cardiologist on 03/26/23, since this is the date of his televisit with the cardiologist.

## 2023-03-24 NOTE — Telephone Encounter (Signed)
Hold Eliquis on August 25 and 26 once get approval from prescribing provider/cardiology to hold.  He can take Eliquis on 8/24 from GI preprocedure standpoint

## 2023-03-24 NOTE — Telephone Encounter (Signed)
ok 

## 2023-03-24 NOTE — Telephone Encounter (Signed)
Patient aware  Thank You [name] [name]

## 2023-03-26 ENCOUNTER — Other Ambulatory Visit: Payer: Self-pay | Admitting: *Deleted

## 2023-03-26 ENCOUNTER — Ambulatory Visit: Payer: PPO | Attending: Interventional Cardiology

## 2023-03-26 ENCOUNTER — Telehealth: Payer: Self-pay | Admitting: *Deleted

## 2023-03-26 DIAGNOSIS — Z01810 Encounter for preprocedural cardiovascular examination: Secondary | ICD-10-CM | POA: Diagnosis not present

## 2023-03-26 NOTE — Telephone Encounter (Signed)
-----   Message from Nurse Alona Bene B sent at 03/24/2023  3:15 PM EDT ----- Need to receive HOLD for Eliquis medication prior to colonoscopy on 03/29/23. Patient has televisit with his cardiologist on 03/26/23, should receive hold on 03/26/23.

## 2023-03-26 NOTE — Telephone Encounter (Signed)
I do not have it in my inbox, please send a new referral for speech therapy if the previous order did not go through.  Thanks

## 2023-03-26 NOTE — Telephone Encounter (Signed)
Called patient today to infer about his televisit with his cardologist, Dr. Shirlee Latch. Patient states he was informed to hold the Eliquis on 03/28/23 and 03/29/23.

## 2023-03-26 NOTE — Progress Notes (Signed)
Virtual Visit via Telephone Note   Because of SHREE KERNER co-morbid illnesses, he is at least at moderate risk for complications without adequate follow up.  This format is felt to be most appropriate for this patient at this time.  The patient did not have access to video technology/had technical difficulties with video requiring transitioning to audio format only (telephone).  All issues noted in this document were discussed and addressed.  No physical exam could be performed with this format.  Please refer to the patient's chart for his consent to telehealth for [facility].  Evaluation Performed:  Preoperative cardiovascular risk assessment _____________   Date:  03/26/2023   Patient ID:  [name] [name], DOB 1950/10/30, MRN [id number] Patient Location:  Home Provider location:   Office  Primary Care Provider:  Sheliah Hatch, MD Primary Cardiologist:  Marca Ancona, MD  Chief Complaint / Patient Profile   72 y.o. y/o male with a h/o aortic insufficiency s/p AVR atrial flutter, CHF, DM type II, HLD, sleep apnea (not on CPAP), GERD who is pending upper endoscopy and presents today for telephonic preoperative cardiovascular risk assessment.  History of Present Illness    [name] [name] is a 72 y.o. male who presents via audio/video conferencing for a telehealth visit today.  Pt was last seen in cardiology clinic on 06/19/2022 by Dr. Shirlee Latch.  At that time [name] [name] was doing well with exception of significant reflux he was experiencing.  He was also experiencing decreased p.o. intake was down 16 pounds due to esophageal diverticulum repair. He reported no new cardiac complaints.The patient is now pending procedure as outlined above. Since his last visit, he reports doing well with no new cardiac complaints and notes that his weight loss has leveled out and he is no longer losing weight.  He denies chest pain, shortness of breath, lower extremity edema,  fatigue, palpitations, melena, hematuria, hemoptysis, diaphoresis, weakness, presyncope, syncope, orthopnea, and PND.     Past Medical History    Past Medical History:  Diagnosis Date   Acute gastritis    Aortic insufficiency    s/p Ross Procedure: AVR/Pulmonary autograft in aortic position & PVR with bioprosthetic PV 1994 in [place]   Aortic root aneurysm (HCC)    Atrial flutter (HCC)    history- no atrial flutter since cardioversion   CHF (congestive heart failure) (HCC)    Diabetes mellitus without complication (HCC)    type 2   Early satiety    Esophageal stricture    GERD (gastroesophageal reflux disease)    Headache syndrome 10/18/2018   Hiatal hernia    Hyperlipidemia    Neuropathy    left brachial plexus   Pain, dental    SCC (squamous cell carcinoma) 02/10/2023   right forearm   Sleep apnea    09/12/20 AHI 35.2/hr. Does not use CPAP. (03/26/22)   Past Surgical History:  Procedure Laterality Date   AORTIC VALVE REPLACEMENT  1994   CARDIOVERSION N/A 03/14/2020   Procedure: CARDIOVERSION;  Surgeon: Laurey Morale, MD;  Location: [facility];  Service: Cardiovascular;  Laterality: N/A;   ESOPHAGEAL MANOMETRY N/A 09/23/2022   Procedure: ESOPHAGEAL MANOMETRY (EM);  Surgeon: Napoleon Form, MD;  Location: [facility];  Service: Gastroenterology;  Laterality: N/A;   ESOPHAGOGASTRODUODENOSCOPY N/A 03/30/2022   Procedure: ESOPHAGOGASTRODUODENOSCOPY (EGD);  Surgeon: Corliss Skains, MD;  Location: [facility] OR;  Service: Thoracic;  Laterality: N/A;   GANGLION CYST EXCISION  JAN 2002  HIATAL HERNIA REPAIR  DEC 2002   INTERCOSTAL NERVE BLOCK Right 03/30/2022   Procedure: INTERCOSTAL NERVE BLOCK;  Surgeon: Corliss Skains, MD;  Location: [facility] OR;  Service: Thoracic;  Laterality: Right;   TEE WITHOUT CARDIOVERSION N/A 03/14/2020   Procedure: TRANSESOPHAGEAL ECHOCARDIOGRAM (TEE);  Surgeon: Laurey Morale, MD;  Location: [facility] ENDOSCOPY;  Service: Cardiovascular;   Laterality: N/A;   TONSILLECTOMY     VASECTOMY  DEC 2002    Allergies  Allergies  Allergen Reactions   Penicillins Hives and Swelling   Quinidine Other (See Comments)    Increase heart beat per patient    Home Medications    Prior to Admission medications   Medication Sig Start Date End Date Taking? Authorizing Provider  apixaban (ELIQUIS) 5 MG TABS tablet Take 1 tablet (5 mg total) by mouth 2 (two) times daily. 10/16/21   Laurey Morale, MD  azelastine (ASTELIN) 0.1 % nasal spray Place 2 sprays into both nostrils 2 (two) times daily. 12/31/21   Sheliah Hatch, MD  cetirizine (ZYRTEC) 10 MG tablet TAKE 1 TABLET(10 MG) BY MOUTH DAILY 04/24/21   Sheliah Hatch, MD  empagliflozin (JARDIANCE) 10 MG TABS tablet Take 1 tablet (10 mg total) by mouth daily before breakfast. NEEDS FOLLOW UP APPOINTMENT FOR MORE REFILLS 01/26/23   Laurey Morale, MD  famotidine (PEPCID) 40 MG tablet Take 40 mg by mouth 2 (two) times daily.    [provider]  finasteride (PROSCAR) 5 MG tablet TAKE 1 TABLET(5 MG) BY MOUTH DAILY 11/18/22   Sheliah Hatch, MD  fluticasone [facility]) 50 MCG/ACT nasal spray Place 2 sprays into both nostrils as needed for allergies or rhinitis. 12/22/21   Sheliah Hatch, MD  furosemide (LASIX) 20 MG tablet TAKE 1 TABLET(20 MG) BY MOUTH EVERY OTHER DAY 12/01/22   Laurey Morale, MD  guaiFENesin (MUCINEX) 600 MG 12 hr tablet Take by mouth 2 (two) times daily.    [provider]  hydrocortisone 2.5 % cream Apply twice daily to affected areas as needed for rash/itching up to 2 weeks 10/19/22   Terri Piedra, DO  ipratropium (ATROVENT) 0.06 % nasal spray Place 2 sprays into both nostrils 4 (four) times daily. 07/21/22   Ferol Luz, MD  losartan (COZAAR) 25 MG tablet Take 0.5 tablets (12.5 mg total) by mouth daily. 01/27/23   Laurey Morale, MD  metoprolol succinate (TOPROL-XL) 100 MG 24 hr tablet TAKE 1 TABLET(100 MG) BY MOUTH TWICE DAILY  03/19/23   Laurey Morale, MD  montelukast (SINGULAIR) 10 MG tablet TAKE 1 TABLET(10 MG) BY MOUTH AT BEDTIME 11/19/22   Sheliah Hatch, MD  pantoprazole (PROTONIX) 40 MG tablet Take 1 tablet (40 mg total) by mouth daily. 03/19/23   Napoleon Form, MD  rosuvastatin (CRESTOR) 40 MG tablet TAKE 1 TABLET(40 MG) BY MOUTH DAILY 08/31/22   Laurey Morale, MD  spironolactone (ALDACTONE) 25 MG tablet TAKE 1 TABLET(25 MG) BY MOUTH DAILY 02/16/22   Laurey Morale, MD    Physical Exam    Vital Signs:  [name] [name] does not have vital signs available for review today.  Given telephonic nature of communication, physical exam is limited. AAOx3. NAD. Normal affect.  Speech and respirations are unlabored.  Accessory Clinical Findings    None  Assessment & Plan    1.  Preoperative Cardiovascular Risk Assessment: -Patient's RCRI score is 0.9%  The patient affirms he has been doing well without any new  cardiac symptoms. They are able to achieve 7 METS without cardiac limitations. Therefore, based on ACC/AHA guidelines, the patient would be at acceptable risk for the planned procedure without further cardiovascular testing. The patient was advised that if he develops new symptoms prior to surgery to contact our office to arrange for a follow-up visit, and he verbalized understanding.   The patient was advised that if he develops new symptoms prior to surgery to contact our office to arrange for a follow-up visit, and he verbalized understanding.  Per office protocol, patient can hold Eliquis for 2 days prior to procedure.   A copy of this note will be routed to requesting surgeon.  Time:   Today, I have spent 8 minutes with the patient with telehealth technology discussing medical history, symptoms, and management plan.     Napoleon Form, Leodis Rains, NP  03/26/2023, 7:22 AM

## 2023-03-26 NOTE — Telephone Encounter (Signed)
Dr. Lavon Paganini, looks like you may have been ordering Speech Therapy for the patient. The order has not been completed, waiting to be signed.

## 2023-03-26 NOTE — Telephone Encounter (Signed)
Noted Eliquis hold 2 days prior to procedure per patient's cardiologist, Dr. Shirlee Latch. Noted per Televisit AVS.

## 2023-03-29 ENCOUNTER — Ambulatory Visit (AMBULATORY_SURGERY_CENTER): Payer: PPO | Admitting: Gastroenterology

## 2023-03-29 ENCOUNTER — Encounter: Payer: Self-pay | Admitting: Gastroenterology

## 2023-03-29 VITALS — BP 143/95 | HR 55 | Temp 98.0°F | Resp 13 | Ht 75.0 in | Wt 215.0 lb

## 2023-03-29 DIAGNOSIS — R1310 Dysphagia, unspecified: Secondary | ICD-10-CM | POA: Diagnosis not present

## 2023-03-29 MED ORDER — SODIUM CHLORIDE 0.9 % IV SOLN: 500.0000 mL | Freq: Once | INTRAVENOUS | Status: DC

## 2023-03-29 NOTE — Patient Instructions (Signed)
Resume previous diet.  Continue present medications.  Will discuss with Dr. Cliffton Asters if he can remove the disrupted staples, likely etiology of globus sensation and dysphagia.  Resume Eliquis (apixaban) at prior dose today. Refer to managing physician for further adjustment of therapy.   YOU HAD AN ENDOSCOPIC PROCEDURE TODAY AT THE [facility]:   Refer to the procedure report that was given to you for any specific questions about what was found during the examination.  If the procedure report does not answer your questions, please call your gastroenterologist to clarify.  If you requested that your care partner not be given the details of your procedure findings, then the procedure report has been included in a sealed envelope for you to review at your convenience later.  YOU SHOULD EXPECT: Some feelings of bloating in the abdomen. Passage of more gas than usual.  Walking can help get rid of the air that was put into your GI tract during the procedure and reduce the bloating. If you had a lower endoscopy (such as a colonoscopy or flexible sigmoidoscopy) you may notice spotting of blood in your stool or on the toilet paper. If you underwent a bowel prep for your procedure, you may not have a normal bowel movement for a few days.  Please Note:  You might notice some irritation and congestion in your nose or some drainage.  This is from the oxygen used during your procedure.  There is no need for concern and it should clear up in a day or so.  SYMPTOMS TO REPORT IMMEDIATELY:  Following upper endoscopy (EGD)  Vomiting of blood or coffee ground material  New chest pain or pain under the shoulder blades  Painful or persistently difficult swallowing  New shortness of breath  Fever of 100F or higher  Black, tarry-looking stools  For urgent or emergent issues, a gastroenterologist can be reached at any hour by calling [phone]. Do not use MyChart messaging for urgent concerns.     DIET:  We do recommend a small meal at first, but then you may proceed to your regular diet.  Drink plenty of fluids but you should avoid alcoholic beverages for 24 hours.  ACTIVITY:  You should plan to take it easy for the rest of today and you should NOT DRIVE or use heavy machinery until tomorrow (because of the sedation medicines used during the test).    FOLLOW UP: Our staff will call the number listed on your records the next business day following your procedure.  We will call around 7:15- 8:00 am to check on you and address any questions or concerns that you may have regarding the information given to you following your procedure. If we do not reach you, we will leave a message.     If any biopsies were taken you will be contacted by phone or by letter within the next 1-3 weeks.  Please call us at [phone] if you have not heard about the biopsies in 3 weeks.    SIGNATURES/CONFIDENTIALITY: You and/or your care partner have signed paperwork which will be entered into your electronic medical record.  These signatures attest to the fact that that the information above on your After Visit Summary has been reviewed and is understood.  Full responsibility of the confidentiality of this discharge information lies with you and/or your care-partner.

## 2023-03-29 NOTE — Op Note (Addendum)
[facility] Patient Name: [name] [name] Procedure Date: 03/29/2023 2:42 PM MRN: [id number] Endoscopist: Napoleon Form , MD, [id number] Age: 72 Referring MD:  Date of Birth: 1951/05/17 Gender: Male Account #: [id number] Procedure:                Upper GI endoscopy Indications:              Dysphagia Medicines:                Monitored Anesthesia Care Procedure:                Pre-Anesthesia Assessment:                           - Prior to the procedure, a History and Physical                            was performed, and patient medications and                            allergies were reviewed. The patient's tolerance of                            previous anesthesia was also reviewed. The risks                            and benefits of the procedure and the sedation                            options and risks were discussed with the patient.                            All questions were answered, and informed consent                            was obtained. Prior Anticoagulants: The patient                            last took Eliquis (apixaban) 3 days prior to the                            procedure. ASA Grade Assessment: III - A patient                            with severe systemic disease. After reviewing the                            risks and benefits, the patient was deemed in                            satisfactory condition to undergo the procedure.                           After obtaining informed consent, the endoscope was  passed under direct vision. Throughout the                            procedure, the patient's blood pressure, pulse, and                            oxygen saturations were monitored continuously. The                            Olympus Scope [id number] was introduced through the                            mouth, and advanced to the second part of duodenum.                            The upper GI endoscopy  was accomplished without                            difficulty. The patient tolerated the procedure                            well. Scope In: Scope Out: Findings:                 Repair of Zenker's diverticula with visible staples                            protruding into lumen found in the upper third of                            the esophagus. This was characterized by luminal                            narrowing with intact and disrupted staples, small                            visible diverticula at 30cm from incissors.                           The Z-line was regular and was found 42 cm from the                            incisors.                           The exam of the esophagus was otherwise normal.                           The stomach was normal.                           The cardia and gastric fundus were normal on                            retroflexion.  The examined duodenum was normal. Complications:            No immediate complications. Estimated Blood Loss:     Estimated blood loss was minimal. Impression:               - Repair of Zenker's diverticula with visible                            staples protruding into lumen and lu anastomosis                            was found, characterized by a disrupted staple line.                           - Z-line regular, 42 cm from the incisors.                           - Normal stomach.                           - Normal examined duodenum.                           - No specimens collected. Recommendation:           - Patient has a contact number available for                            emergencies. The signs and symptoms of potential                            delayed complications were discussed with the                            patient. Return to normal activities tomorrow.                            Written discharge instructions were provided to the                            patient.                            - Resume previous diet.                           - Continue present medications.                           - Will discuss with Dr.Lightfoot if he can remove                            the disrupted staples, likely etiology of globus                            sensation and dysphagia                           -  Resume Eliquis (apixaban) at prior dose today.                            Refer to managing physician for further adjustment                            of therapy. Napoleon Form, MD 03/29/2023 3:03:21 PM This report has been signed electronically.

## 2023-03-29 NOTE — Telephone Encounter (Signed)
ok 

## 2023-03-29 NOTE — Progress Notes (Unsigned)
 Please refer to office visit note 03/19/23. No additional changes in H&P Patient is appropriate for planned procedure(s) and anesthesia in an ambulatory setting  K. Scherry Ran , MD [phone]

## 2023-03-29 NOTE — Progress Notes (Unsigned)
VS by DT    

## 2023-03-30 ENCOUNTER — Telehealth: Payer: Self-pay | Admitting: *Deleted

## 2023-03-30 NOTE — Telephone Encounter (Signed)
Left message on f/u call 

## 2023-04-02 ENCOUNTER — Other Ambulatory Visit: Payer: Self-pay

## 2023-04-09 ENCOUNTER — Ambulatory Visit: Payer: PPO | Admitting: Thoracic Surgery (Cardiothoracic Vascular Surgery)

## 2023-04-09 VITALS — BP 118/76 | HR 66 | Ht 75.0 in | Wt 216.0 lb

## 2023-04-09 DIAGNOSIS — R1310 Dysphagia, unspecified: Secondary | ICD-10-CM | POA: Diagnosis not present

## 2023-04-09 NOTE — Progress Notes (Signed)
      [address]       [place] [address]             [phone]        [facility] Medical Record #[id number] Date of Birth: 1950/09/18  Referring: Napoleon Form, MD Primary Care: Sheliah Hatch, MD Primary Cardiologist:Dalton Shirlee Latch, MD  Reason for visit:   follow-up  History of Present Illness:     72 year old male presents in follow-up to discuss recent endoscopy.  He previously underwent a right robotic assisted thoracoscopy with esophagomyotomy and diverticulectomy.  He is only reported some cervical dysphagia which is improved after undergoing a dilation by ENT.  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  Physical Exam: BP 118/76 (BP Location: Left Arm, Patient Position: Sitting)   Pulse 66   Ht 6\' 3"  (1.905 m)   Wt 216 lb (98 kg)   SpO2 96% Comment: RA  BMI 27.00 kg/m   Alert NAD Abdomen, ND no peripheral edema      Assessment / Plan:   72 year old male with possible circumflex lesion.  Will need to discuss this further with Dr. Lavon Paganini, and Dr. Delford Field his otolaryngologist to determine if the myotomy would be beneficial.  Of note, he also has an elevated left hemidiaphragm which could be contributing to his reflux symptoms.  There is concern that the myotomy he may be at higher risk of aspiration with his reflux.  I will follow-up with him for further discussions.    Corliss Skains 04/09/2023 2:37 PM

## 2023-04-13 ENCOUNTER — Encounter: Payer: Self-pay | Admitting: Dermatology

## 2023-04-13 NOTE — Telephone Encounter (Signed)
Hi Jetta,  Can you explain to the pt that he may decide to defer surgery at this time, the bx does not remove the entire leision and even thought it looks like it's gone clinically, there are still skin cancer cells left behind that will continue to grow and the lesion will recur if not cut out completely.

## 2023-04-15 ENCOUNTER — Ambulatory Visit (INDEPENDENT_AMBULATORY_CARE_PROVIDER_SITE_OTHER): Payer: PPO | Admitting: Family Medicine

## 2023-04-15 ENCOUNTER — Encounter: Payer: Self-pay | Admitting: Family Medicine

## 2023-04-15 VITALS — BP 122/72 | HR 82 | Temp 97.8°F | Resp 17 | Ht 75.0 in | Wt 214.5 lb

## 2023-04-15 DIAGNOSIS — Z7984 Long term (current) use of oral hypoglycemic drugs: Secondary | ICD-10-CM

## 2023-04-15 DIAGNOSIS — E782 Mixed hyperlipidemia: Secondary | ICD-10-CM | POA: Diagnosis not present

## 2023-04-15 DIAGNOSIS — I10 Essential (primary) hypertension: Secondary | ICD-10-CM | POA: Diagnosis not present

## 2023-04-15 DIAGNOSIS — E118 Type 2 diabetes mellitus with unspecified complications: Secondary | ICD-10-CM | POA: Diagnosis not present

## 2023-04-15 DIAGNOSIS — Z23 Encounter for immunization: Secondary | ICD-10-CM | POA: Diagnosis not present

## 2023-04-15 LAB — HEPATIC FUNCTION PANEL
ALT: 28 U/L (ref 0–53)
AST: 22 U/L (ref 0–37)
Albumin: 4.2 g/dL (ref 3.5–5.2)
Alkaline Phosphatase: 63 U/L (ref 39–117)
Bilirubin, Direct: 0.2 mg/dL (ref 0.0–0.3)
Total Bilirubin: 1.4 mg/dL — ABNORMAL HIGH (ref 0.2–1.2)
Total Protein: 7.1 g/dL (ref 6.0–8.3)

## 2023-04-15 LAB — CBC WITH DIFFERENTIAL/PLATELET
Basophils Absolute: 0 10*3/uL (ref 0.0–0.1)
Basophils Relative: 0.2 % (ref 0.0–3.0)
Eosinophils Absolute: 0.1 10*3/uL (ref 0.0–0.7)
Eosinophils Relative: 1.4 % (ref 0.0–5.0)
HCT: 46.5 % (ref 39.0–52.0)
Hemoglobin: 15.2 g/dL (ref 13.0–17.0)
Lymphocytes Relative: 25.3 % (ref 12.0–46.0)
Lymphs Abs: 2.1 10*3/uL (ref 0.7–4.0)
MCHC: 32.8 g/dL (ref 30.0–36.0)
MCV: 91.7 fl (ref 78.0–100.0)
Monocytes Absolute: 0.9 10*3/uL (ref 0.1–1.0)
Monocytes Relative: 11.2 % (ref 3.0–12.0)
Neutro Abs: 5 10*3/uL (ref 1.4–7.7)
Neutrophils Relative %: 61.9 % (ref 43.0–77.0)
Platelets: 269 10*3/uL (ref 150.0–400.0)
RBC: 5.07 Mil/uL (ref 4.22–5.81)
RDW: 13.5 % (ref 11.5–15.5)
WBC: 8.1 10*3/uL (ref 4.0–10.5)

## 2023-04-15 LAB — MICROALBUMIN / CREATININE URINE RATIO
Creatinine,U: 19.7 mg/dL
Microalb Creat Ratio: 3.6 mg/g (ref 0.0–30.0)
Microalb, Ur: <0.7 mg/dL (ref 0.0–1.9)

## 2023-04-15 LAB — BASIC METABOLIC PANEL
BUN: 17 mg/dL (ref 6–23)
CO2: 26 meq/L (ref 19–32)
Calcium: 9.4 mg/dL (ref 8.4–10.5)
Chloride: 101 meq/L (ref 96–112)
Creatinine, Ser: 0.8 mg/dL (ref 0.40–1.50)
GFR: 89.52 mL/min (ref >60.00)
Glucose, Bld: 155 mg/dL — ABNORMAL HIGH (ref 70–99)
Potassium: 4.1 meq/L (ref 3.5–5.1)
Sodium: 137 meq/L (ref 135–145)

## 2023-04-15 LAB — TSH: TSH: 2.41 u[IU]/mL (ref 0.35–5.50)

## 2023-04-15 LAB — LIPID PANEL
Cholesterol: 141 mg/dL (ref 0–200)
HDL: 53.4 mg/dL (ref >39.00)
LDL Cholesterol: 54 mg/dL (ref 0–99)
NonHDL: 87.98
Total CHOL/HDL Ratio: 3
Triglycerides: 172 mg/dL — ABNORMAL HIGH (ref 0.0–149.0)
VLDL: 34.4 mg/dL (ref 0.0–40.0)

## 2023-04-15 LAB — HEMOGLOBIN A1C: Hgb A1c MFr Bld: 7.7 % — ABNORMAL HIGH (ref 4.6–6.5)

## 2023-04-15 NOTE — Progress Notes (Signed)
   Subjective:    Patient ID: [name] [name], male    DOB: November 16, 1950, 72 y.o.   MRN: [id number]  HPI DM- chronic problem, on Jardiance 10mg  daily.  UTD on eye exam (due later this month), due for foot exam and microalbumin.  Denies symptomatic lows.  No numbness/tingling of hands/feet.  HTN- on Losartan 12.5mg  daily, Metoprolol 100mg  BID, Spironolactone 25mg  daily.  No CP, SOB, HA's, visual changes, edema.  Hyperlipidemia- chronic problem, on Crestor 40mg  daily.  Last LDL 52.  No abd pain, N/V.   Review of Systems For ROS see HPI     Objective:   Physical Exam Vitals reviewed.  Constitutional:      General: He is not in acute distress.    Appearance: Normal appearance. He is well-developed. He is not ill-appearing.  HENT:     Head: Normocephalic and atraumatic.  Eyes:     Extraocular Movements: Extraocular movements intact.     Conjunctiva/sclera: Conjunctivae normal.     Pupils: Pupils are equal, round, and reactive to light.  Neck:     Thyroid: No thyromegaly.  Cardiovascular:     Rate and Rhythm: Normal rate and regular rhythm.     Pulses: Normal pulses.     Heart sounds: Murmur (loud aortic click) heard.  Pulmonary:     Effort: Pulmonary effort is normal. No respiratory distress.     Breath sounds: Normal breath sounds.  Abdominal:     General: Bowel sounds are normal. There is no distension.     Palpations: Abdomen is soft.  Musculoskeletal:     Cervical back: Normal range of motion and neck supple.     Right lower leg: No edema.     Left lower leg: No edema.  Lymphadenopathy:     Cervical: No cervical adenopathy.  Skin:    General: Skin is warm and dry.  Neurological:     General: No focal deficit present.     Mental Status: He is alert and oriented to person, place, and time.     Cranial Nerves: No cranial nerve deficit.  Psychiatric:        Mood and Affect: Mood normal.        Behavior: Behavior normal.           Assessment & Plan:

## 2023-04-15 NOTE — Assessment & Plan Note (Signed)
Chronic problem.  On Crestor 40mg daily w/o difficulty.  Check labs.  Adjust meds prn  

## 2023-04-15 NOTE — Assessment & Plan Note (Signed)
Chronic problem, on Losartan 12.5mg  daily, Metoprolol 100mg  BID, Spironolactone 25mg  daily w/ excellent control.  Currently asymptomatic.  Check labs due to ARB and diuretic use.  No anticipated med changes.  Will follow.

## 2023-04-15 NOTE — Patient Instructions (Signed)
Follow up in 3-4 months to recheck sugars We'll notify you of your lab results and make any changes if needed Keep up the good work on healthy diet and regular exercise- you're doing great!! Get your eye exam and have them send me a copy of the report Call with any questions or concerns Stay Safe!  Stay Healthy! Happy Fall!!!

## 2023-04-15 NOTE — Assessment & Plan Note (Signed)
Chronic problem.  Currently on Jardiance 10mg  daily w/o difficulty.  Foot exam done today.  Microalbumin ordered.  Check labs.  Adjust meds prn

## 2023-04-16 ENCOUNTER — Telehealth: Payer: Self-pay

## 2023-04-16 ENCOUNTER — Ambulatory Visit (INDEPENDENT_AMBULATORY_CARE_PROVIDER_SITE_OTHER): Payer: PPO | Admitting: Thoracic Surgery (Cardiothoracic Vascular Surgery)

## 2023-04-16 DIAGNOSIS — R1310 Dysphagia, unspecified: Secondary | ICD-10-CM

## 2023-04-16 NOTE — Telephone Encounter (Signed)
-----   Message from Neena Rhymes sent at 04/16/2023  7:40 AM EDT ----- A1C is up just slightly from last check.  This will improve w/ low carb diet and regular physical activity.  No change to medications at this time.  Remainder of labs look great!

## 2023-04-16 NOTE — Progress Notes (Signed)
     [address]       [name] [address]             [phone]       Patient: Home Provider: Office Consent for Telemedicine visit obtained.  Today's visit was completed via a real-time telehealth (see specific modality noted below). The patient/authorized person provided oral consent at the time of the visit to engage in a telemedicine encounter with the present provider at [facility]. The patient/authorized person was informed of the potential benefits, limitations, and risks of telemedicine. The patient/authorized person expressed understanding that the laws that protect confidentiality also apply to telemedicine. The patient/authorized person acknowledged understanding that telemedicine does not provide emergency services and that he or she would need to call 911 or proceed to the nearest hospital for help if such a need arose.   Total time spent in the clinical discussion 10 minutes.  Telehealth Modality: Phone visit (audio only)  I had a telephone visit with Mr. Pfaff.  I explained to him that we will plan for botox injections into his cricopharyngeal muscle first I will speak with Dr. Lavon Paganini to see if this is something that she may offer.  Sherl Yzaguirre Keane Scrape

## 2023-04-16 NOTE — Telephone Encounter (Signed)
Pt is aware of the lab results  °

## 2023-04-19 ENCOUNTER — Telehealth: Payer: Self-pay

## 2023-04-19 NOTE — Telephone Encounter (Signed)
Pt sent my chart message stating:  Is it possible to see my arm prior to this scheduled surgery?  Since it has been a couple of months, I can not even see where the site is.   Not knowing, it seems a shame to go in and cut up the arm if there is no need. Let me know, and thanks.  Dr. Onalee Hua reviewed pt's message and stated to contact pt to advise: that he may decide to defer surgery at this time, the bx does not remove the entire leision and even thought it looks like it's gone clinically, there are still skin cancer cells left behind that will continue to grow and the lesion will recur if not cut out completely.     Patient notified and discussed that if he decides he would like to wait we will just need to see him in 6 months for TBSE. Pt states at this time he would like to hold off on having the Excision. He will plan to contact our office closer to the 6 months to schedule his follow up visit. Provider notified.

## 2023-04-20 ENCOUNTER — Encounter: Payer: PPO | Admitting: Dermatology

## 2023-04-22 ENCOUNTER — Ambulatory Visit (INDEPENDENT_AMBULATORY_CARE_PROVIDER_SITE_OTHER): Payer: PPO | Admitting: Thoracic Surgery (Cardiothoracic Vascular Surgery)

## 2023-04-22 DIAGNOSIS — Q396 Congenital diverticulum of esophagus: Secondary | ICD-10-CM

## 2023-04-22 NOTE — Progress Notes (Signed)
     [address]       [name] [address]             [phone]       Patient: Home Provider: Office Consent for Telemedicine visit obtained.  Today's visit was completed via a real-time telehealth (see specific modality noted below). The patient/authorized person provided oral consent at the time of the visit to engage in a telemedicine encounter with the present provider at [facility]. The patient/authorized person was informed of the potential benefits, limitations, and risks of telemedicine. The patient/authorized person expressed understanding that the laws that protect confidentiality also apply to telemedicine. The patient/authorized person acknowledged understanding that telemedicine does not provide emergency services and that he or she would need to call 911 or proceed to the nearest hospital for help if such a need arose.   Total time spent in the clinical discussion 10 minutes.  Telehealth Modality: Phone visit (audio only)  I had a telephone visit with Mr. Tumlinson.  I explained that he will need to speech with ENT about doing the botox injections.  Dr. Lavon Paganini is happy to follow for all lower esophageal motility issues.  Chane Magner Keane Scrape

## 2023-04-29 DIAGNOSIS — H52203 Unspecified astigmatism, bilateral: Secondary | ICD-10-CM | POA: Diagnosis not present

## 2023-04-29 DIAGNOSIS — E119 Type 2 diabetes mellitus without complications: Secondary | ICD-10-CM | POA: Diagnosis not present

## 2023-04-29 DIAGNOSIS — H5203 Hypermetropia, bilateral: Secondary | ICD-10-CM | POA: Diagnosis not present

## 2023-04-29 LAB — HM DIABETES EYE EXAM

## 2023-05-02 ENCOUNTER — Other Ambulatory Visit (HOSPITAL_COMMUNITY): Payer: Self-pay | Admitting: Cardiology

## 2023-05-02 ENCOUNTER — Encounter (HOSPITAL_COMMUNITY): Payer: Self-pay | Admitting: Cardiology

## 2023-05-03 ENCOUNTER — Other Ambulatory Visit (HOSPITAL_COMMUNITY): Payer: Self-pay

## 2023-05-03 MED ORDER — EMPAGLIFLOZIN 10 MG PO TABS
10.0000 mg | ORAL_TABLET | Freq: Every day | ORAL | 3 refills | Status: DC
  Filled 2023-05-03: qty 90, 90d supply, fill #0
  Filled 2023-07-27: qty 90, 90d supply, fill #1
  Filled 2023-10-25: qty 90, 90d supply, fill #2
  Filled 2024-01-23: qty 90, 90d supply, fill #3

## 2023-05-13 ENCOUNTER — Encounter: Payer: Self-pay | Admitting: Family Medicine

## 2023-05-14 MED ORDER — AZELASTINE HCL 0.1 % NA SOLN: 2.0000 | Freq: Two times a day (BID) | NASAL | 12 refills | Status: DC

## 2023-05-14 NOTE — Telephone Encounter (Signed)
This was last filled May of 2023 okay for me to send refill?

## 2023-05-17 ENCOUNTER — Other Ambulatory Visit (HOSPITAL_COMMUNITY): Payer: Self-pay

## 2023-05-17 MED ORDER — SPIRONOLACTONE 25 MG PO TABS: ORAL_TABLET | ORAL | 0 refills | Status: DC

## 2023-05-19 ENCOUNTER — Ambulatory Visit
Admission: RE | Admit: 2023-05-19 | Discharge: 2023-05-19 | Disposition: A | Discharge status: Home / Self Care | Payer: PPO | Source: Ambulatory Visit | Attending: Thoracic Surgery (Cardiothoracic Vascular Surgery) | Admitting: Thoracic Surgery (Cardiothoracic Vascular Surgery)

## 2023-05-19 DIAGNOSIS — I2510 Atherosclerotic heart disease of native coronary artery without angina pectoris: Secondary | ICD-10-CM | POA: Diagnosis not present

## 2023-05-19 DIAGNOSIS — I7121 Aneurysm of the ascending aorta, without rupture: Secondary | ICD-10-CM | POA: Diagnosis not present

## 2023-05-19 DIAGNOSIS — I700 Atherosclerosis of aorta: Secondary | ICD-10-CM | POA: Diagnosis not present

## 2023-05-19 MED ORDER — IOPAMIDOL (ISOVUE-370) INJECTION 76%
500.0000 mL | Freq: Once | INTRAVENOUS | Status: AC | PRN
  Administered 2023-05-19: 75 mL via INTRAVENOUS

## 2023-05-24 ENCOUNTER — Ambulatory Visit (HOSPITAL_COMMUNITY)
Admission: RE | Admit: 2023-05-24 | Discharge: 2023-05-24 | Disposition: A | Discharge status: Home / Self Care | Payer: PPO | Source: Ambulatory Visit | Attending: Cardiology | Admitting: Cardiology

## 2023-05-24 ENCOUNTER — Encounter (HOSPITAL_COMMUNITY): Payer: Self-pay | Admitting: Cardiology

## 2023-05-24 ENCOUNTER — Ambulatory Visit (HOSPITAL_BASED_OUTPATIENT_CLINIC_OR_DEPARTMENT_OTHER)
Admission: RE | Admit: 2023-05-24 | Discharge: 2023-05-24 | Disposition: A | Discharge status: Home / Self Care | Payer: PPO | Source: Ambulatory Visit | Attending: Cardiology | Admitting: Cardiology

## 2023-05-24 VITALS — BP 128/78 | HR 57 | Wt 220.0 lb

## 2023-05-24 DIAGNOSIS — Z79899 Other long term (current) drug therapy: Secondary | ICD-10-CM | POA: Diagnosis not present

## 2023-05-24 DIAGNOSIS — Z8679 Personal history of other diseases of the circulatory system: Secondary | ICD-10-CM | POA: Insufficient documentation

## 2023-05-24 DIAGNOSIS — E785 Hyperlipidemia, unspecified: Secondary | ICD-10-CM | POA: Diagnosis not present

## 2023-05-24 DIAGNOSIS — I5022 Chronic systolic (congestive) heart failure: Secondary | ICD-10-CM | POA: Insufficient documentation

## 2023-05-24 DIAGNOSIS — I110 Hypertensive heart disease with heart failure: Secondary | ICD-10-CM | POA: Insufficient documentation

## 2023-05-24 DIAGNOSIS — I7121 Aneurysm of the ascending aorta, without rupture: Secondary | ICD-10-CM | POA: Diagnosis not present

## 2023-05-24 DIAGNOSIS — I4891 Unspecified atrial fibrillation: Secondary | ICD-10-CM | POA: Diagnosis not present

## 2023-05-24 DIAGNOSIS — I4892 Unspecified atrial flutter: Secondary | ICD-10-CM | POA: Insufficient documentation

## 2023-05-24 DIAGNOSIS — Z7901 Long term (current) use of anticoagulants: Secondary | ICD-10-CM | POA: Diagnosis not present

## 2023-05-24 DIAGNOSIS — E119 Type 2 diabetes mellitus without complications: Secondary | ICD-10-CM | POA: Diagnosis not present

## 2023-05-24 DIAGNOSIS — Z7984 Long term (current) use of oral hypoglycemic drugs: Secondary | ICD-10-CM | POA: Diagnosis not present

## 2023-05-24 LAB — ECHOCARDIOGRAM COMPLETE
AR max vel: 3.44 cm2
AV Area VTI: 3.51 cm2
AV Area mean vel: 3.53 cm2
AV Mean grad: 3.6 mm[Hg]
AV Peak grad: 6.9 mm[Hg]
Ao pk vel: 1.32 m/s
Area-P 1/2: 3.12 cm2
Calc EF: 51.2 %
Est EF: 50
P 1/2 time: 787 ms
S' Lateral: 4.3 cm
Single Plane A2C EF: 50.2 %
Single Plane A4C EF: 49.7 %

## 2023-05-24 MED ORDER — METOPROLOL SUCCINATE ER 100 MG PO TB24: ORAL_TABLET | ORAL | 3 refills | Status: DC

## 2023-05-24 MED ORDER — APIXABAN 5 MG PO TABS: 5.0000 mg | ORAL_TABLET | Freq: Two times a day (BID) | ORAL | 3 refills | Status: DC

## 2023-05-24 MED ORDER — LOSARTAN POTASSIUM 25 MG PO TABS: 25.0000 mg | ORAL_TABLET | Freq: Every day | ORAL | 3 refills | Status: DC

## 2023-05-24 NOTE — Patient Instructions (Signed)
CHANGE Toprol XL to 100 mg in the morning and 50 mg in the evening.  INCREASE Losartan to 25 mg daily.  Blood work in 10 days.  Your physician recommends that you schedule a follow-up appointment in: 4 months ( February 2025) ** PLEASE CALL THE OFFICE IN DECEMBER TO ARRANGE YOUR FOLLOW UP APPOINTMENT. **  If you have any questions or concerns before your next appointment please send [place] a message through [place] or call our office at [phone].    TO LEAVE A MESSAGE FOR THE NURSE SELECT OPTION 2, PLEASE LEAVE A MESSAGE INCLUDING: YOUR NAME DATE OF BIRTH CALL BACK NUMBER REASON FOR CALL**this is important as we prioritize the call backs  YOU WILL RECEIVE A CALL BACK THE SAME DAY AS LONG AS YOU CALL BEFORE 4:00 PM  At the [facility], you and your health needs are our priority. As part of our continuing mission to provide you with exceptional heart care, we have created designated Provider Care Teams. These Care Teams include your primary Cardiologist (physician) and Advanced Practice Providers (APPs- Physician Assistants and Nurse Practitioners) who all work together to provide you with the care you need, when you need it.   You may see any of the following providers on your designated Care Team at your next follow up: Dr Arvilla Meres Dr Marca Ancona Dr. Dorthula Nettles Dr. Clearnce Hasten Amy Filbert Schilder, NP Robbie Lis, [place] [facility] [place], [place] Brynda Peon, NP [place] Lee, NP Karle Plumber, PharmD   Please be sure to bring in all your medications bottles to every appointment.    Thank you for choosing  [facility]

## 2023-05-24 NOTE — Progress Notes (Signed)
Patient ID: [name] [name], male   DOB: 10-31-1950, 72 y.o.   MRN: [id number] PCP: Dr. Beverely Low Cardiology: Dr. Shirlee Latch  72 y.o. with history of aortic insufficiency s/p Ross procedure and aortic root aneurysm presents for followup of Ross procedure.  He has a left-sided brachial plexopathy of uncertain etiology and has atrophy of his left shoulder and upper arm.  He developed weakness in his right hand as well as occasional vertigo-type spells.  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    Echo in 8/18 showed no regurgitation or stenosis of autograft in aortic position, bioprosthetic pulmonic valve with mild pulmonic stenosis, mean gradient 10 mmHg.  EF was read as 40-45%, which is lower than in the past.  MRA chest 3/19 showed 5.7 cm dilation of pulmonary autograft at level of sinuses of Valsalva, MRA chest in 6/20 showed 6 cm dilation of the pulmonary autograft at the level of the sinuses of valsalva.  He is being followed for aortic root aneurysm by Dr. Tyrone Sage.   MRA chest in 6/21 showed aortic root 5.4 cm, no change from prior. Echo in 8/21 showed EF 50%, RV mildly enlarged with mildly decreased systolic function, pulmonary valve autograft in aortic position with mild regurgitation but no stenosis, bioprosthetic pulmonary valve with no significant stenosis or regurgitation, ascending aorta 5.3 cm.   Patient was noted to be in atrial flutter in 8/21, rate 110s.  Looking back, his HR was elevated in the 110s at physician appointments back to 5/21 (no ECGs).  I took him for TEE-guided DCCV in 8/21.  TEE showed EF 40%, mildly decreased RV function, aortic valve replaced by pulmonary autograft with mild AI and no AS, bioprosthetic PV looked  stable.  No LA appendage thrombus, he underwent DCCV.  After DCCV, he developed exertional dyspnea and was started on Lasix.   Echo in 10/21 showed EF improved to 55-60%, mild RV dilation with mildly decreased systolic function, bioprosthetic PV with peak gradient 31 mmHg (mild PS), pulmonary autograft in aortic position with mild regurgitation and no stenosis.  Sleep study showed severe OSA but he does not want to use CPAP.   MRA chest in 6/22 showed stable 5.4 cm aortic root.  Echo was done today and reviewed, EF 50%, mild LV dilation, mildly decreased RV systolic function, pulmonary autograft in aortic position with mild regurgitation and no stenosis, bioprosthetic pulmonary valve with no significant stenosis or regurgitation, PASP 28 mmHg.   Zio monitor (12/22) showed short SVT and NSVT, no significant arrhythmias.    Patient had robotic repair of esophageal diverticulum.   MRA chest in 7/23 showed 5.4 cm aortic root.    CTA chest in 10/24 showed prior graft repair of ascending aorta with residual 4.5 cm dilation in the ascending aorta proximal to the repair, aneurysm left sinus of Valsalva with aortic root measuring 5.6 cm including the aneurysm.   Echo was done today and reviewed, EF 50%, mild LV dilation, mildly decreased RV systolic function, s/p Ross procedure with bioprosthetic pulmonary valve peak gradient 15 mmHg, normal aortic valve autograft, 5.4 cm aortic root.   Patient has been found to have achalasia of the cricopharyngeal muscle.   Patient returns for followup of CHF and PAF.  He is in NSR today, denies palpitations.  No chest pain, no significant exertional dyspnea.  No orthopnea/PND.  Weight is up about 9 lbs.  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   ECG (personally reviewed): NSR, RBBB, 1st degree AVB 330 msec  Labs (2/14): K 3.7, creatinine 0.9, LDL 63, HDL 29 Labs (3/14): BNP 36 Labs (2/15): K 3.7, creatinine 0.8, LDL 61, HDL 30 Labs (4/16): K 3.7, creatinine 0.8, LDL  51, HDL 34 Labs (5/16): HCT 44.9 Labs (11/17): LDL 74, HDL 40, K 4.1, creatinine 1.47 Labs (11/18): LDL 75, HDL 35 Labs (2/19): K 4.2, creatinine 0.88 Labs (5/20): LDL 60, K 4.7, creatinine 8.29 Labs (5/21): TSH normal, LDL 71, K 4.7, creatinine 5.62 Labs (8/21): K 4.3, creatinine 0.75 => 1.02 Labs (5/21): LDL 70 Labs (9/21): K 4, creatinine 0.88 Labs (12/21): LDL 110, K 4.1, creatinine 0.84 Labs (2/22): K 4.8, creatinine 0.9 Labs (4/22): LDL 70, HDL 41 Labs (6/22): LDL 64, K 4.4, creatinine 1.30, hgb 14.6 Labs (12/22): K 4.3, creatinine 0.9 Labs (3/23): K 4.5, creatinine 0.78, LDL 72, TGs 97 Labs (9/23): LDL 49, K 4.3, creatinine 8.65 Labs (9/24): hgb 15.2, TSH normal, K 4.1, creatinine 0.8, LDL 54, TGs 172  PMH: 1. Aortic insufficiency: s/p Ross procedure in 1994 in [place].   - Echo (4/11) with hypokinetic basal septum (likely post-surgical), EF 50%, mild LVH, s/p Ross procedure with native pulmonic valve in aortic position with mild aortic insufficiency and bioprosthetic pulmonic valve with no pulmonic insufficiency, mild MR, aortic upper normal in size.  - Echo (4/13) with EF 55%, mild LVH, mild AI, mild MR, mild RV dilation, bioprosthetic pulmonic valve with peak pressure 25 mmHg.   - Echo (3/14): Mild LV dilation, mild LVH, EF 60%, pulmonary valve in aortic position (s/p Ross) with mild AI and mildly dilated ascending aorta to 4.3 cm, mildly dilated RV with normal systolic function, bioprosthetic PVR with mean gradient 23 mmHg.  Echo (3/15) with EF 55-60%, mild LVH, mild AI with no AS, bioprosthetic pulmonic valve with peak gradient 31 mmHg, ascending aorta 4.4 cm.   - Cardiac MRI/MRA chest (5/15) with mild regurgitation of autograft aortic valve and mild regurgitation of homograft pulmonic valve, dilation of aortic root at sinuses of valsalva (4.8 cm), EF 60%, no LGE, mild RV dilation with normal RV systolic function.   - Echo (5/16) with EF 55-60%, mild autograft AI, mild MR,  mildly decreased RV systolic function, biatrial enlargement, RVSP 35 mmHg, trivially increased gradient across bioprosthetic pulmonic valve (no progression).  - Echo (8/17): EF 55-60%, pulmonary autograft in aortic valve position, mild AI, bioprosthetic pulmonary valve with mild PS (peak 22 mmHg), moderate RV dilation with normal systolic function.  - Echo (8/18): EF 40-45%, mildly increased gradient across bioprosthetic pulmonary valve (mean 10 mmHg), s/p Ross procedure with no stenosis or significant regurgitation of pulmonary autograft in aortic position.  - Echo (8/21): EF 50%, RV mildly enlarged with mildly decreased systolic function, pulmonary valve autograft in aortic position with mild regurgitation but no stenosis, bioprosthetic pulmonary valve with no significant stenosis or regurgitation, ascending aorta 5.3 cm.  - TEE (8/21): EF 40%, mildly decreased RV function, aortic valve replaced by pulmonary autograft with mild AI and no AS, bioprosthetic PV looked stable. - Echo (10/21): EF improved to 55-60%, mild RV dilation with mildly decreased systolic function, bioprosthetic PV with peak gradient 31 mmHg (mild PS), pulmonary autograft in aortic position with mild regurgitation and not stenosis.  -  Echo (12/22): EF 50%, mild LV dilation, mildly decreased RV systolic function, pulmonary autograft in aortic position with mild regurgitation and no stenosis, bioprosthetic pulmonary valve with no significant stenosis or regurgitation, PASP 28 mmHg. - Echo (10/24): EF 50%, mild LV dilation, mildly decreased RV systolic function, s/p Ross procedure with bioprosthetic pulmonary valve peak gradient 15 mmHg, normal aortic valve autograft, 5.4 cm aortic root.  2. Post-operative atrial fibrillation after heart surgery.  3. Normal left heart cath prior to 1994 heart surgery.  4. Left brachial plexopathy with left shoulder muscle atrophy.  Uncertain etiology.  5. BPH 6. Hyperlipidemia.  7. Cervical spinal  stenosis with radiculopathy.   8. 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  9. ETT-Sestamibi (3/14): No ischemia or infarction.  10. Carotid dopplers (6/14): mild disease only.  11. Sinus of valsalva aneurysm: MRA chest 2/18 with dilation of pulmonary autograft at level of sinuses of Valsalva to 5.4 cm when measured into left cusp.  - MRA chest (3/19): Saccular dilatation of the left and right sinuses of valsalva, 5.7 cm aortic root diameter.   - MRA chest (6/20): 6 cm dilated sinuses of valsalva, 4.6 cm ascending aorta.  - MRA chest (6/21): aortic root 5.4 cm, no change from prior - MRA chest (6/22): aortic root 5.4 cm.  - MRA chest (7/23): aortic root 5.4 cm - CTA chest (10/24): prior graft repair of ascending aorta with residual 4.5 cm dilation in the ascending aorta proximal to the repair, aneurysm left sinus of Valsalva with aortic root measuring 5.6 cm including the aneurysm.  12. Elevated left hemidiaphragm.  13. Atrial flutter: Atypical.  Noted in 8/21 => underwent DCCV to NSR.  14. Presyncope: Zio monitor 12/22 with short SVT and NSVT runs, no significant arrhythmias. 15. Esophageal diverticulum: s/p repair 16. Achalasia of the cricopharyngeal muscle  FH: Prostate CA, HTN  SH: Lives in [place], owns a window washing business, married, nonsmoker.   ROS: All systems reviewed and negative except as per HPI.    Current Outpatient Medications  Medication Sig Dispense Refill   azelastine (ASTELIN) 0.1 % nasal spray Place 2 sprays into both nostrils 2 (two) times daily. 30 mL 12   cetirizine (ZYRTEC) 10 MG tablet TAKE 1 TABLET(10 MG) BY MOUTH DAILY 90 tablet 0   empagliflozin (JARDIANCE) 10 MG TABS tablet Take 1 tablet (10 mg total) by mouth daily before breakfast. 90 tablet 3   famotidine (PEPCID) 40 MG tablet Take 40 mg by mouth as needed for heartburn or indigestion.     finasteride (PROSCAR) 5 MG tablet TAKE 1  TABLET(5 MG) BY MOUTH DAILY 90 tablet 1   fluticasone (FLONASE) 50 MCG/ACT nasal spray Place 2 sprays into both nostrils as needed for allergies or rhinitis. 50 mL 30   furosemide (LASIX) 20 MG tablet TAKE 1 TABLET(20 MG) BY MOUTH EVERY OTHER DAY 15 tablet 11   guaiFENesin (MUCINEX) 600 MG 12 hr tablet Take by mouth 2 (two) times daily.     ipratropium (ATROVENT) 0.06 % nasal spray Place 2 sprays into both nostrils 4 (four) times daily. 15 mL 12   montelukast (SINGULAIR) 10 MG tablet TAKE 1 TABLET(10 MG) BY MOUTH AT BEDTIME 30 tablet 6   pantoprazole (PROTONIX) 40 MG tablet Take 1 tablet (40 mg total) by mouth daily. 90 tablet 1   rosuvastatin (CRESTOR) 40 MG tablet TAKE 1 TABLET(40 MG) BY MOUTH DAILY 90 tablet 3  spironolactone (ALDACTONE) 25 MG tablet TAKE 1 TABLET(25 MG) BY MOUTH DAILY 30 tablet 0   apixaban (ELIQUIS) 5 MG TABS tablet Take 1 tablet (5 mg total) by mouth 2 (two) times daily. 180 tablet 3   losartan (COZAAR) 25 MG tablet Take 1 tablet (25 mg total) by mouth daily. 90 tablet 3   metoprolol succinate (TOPROL-XL) 100 MG 24 hr tablet Take 1 tablet (100 mg total) by mouth every morning AND 0.5 tablets (50 mg total) every evening. Take with or immediately following a meal.. 180 tablet 3   No current facility-administered medications for this encounter.    BP 128/78   Pulse (!) 57   Wt 99.8 kg (220 lb)   SpO2 97%   BMI 27.50 kg/m  General: NAD Neck: No JVD, no thyromegaly or thyroid nodule.  Lungs: Clear to auscultation bilaterally with normal respiratory effort. CV: Nondisplaced PMI.  Heart regular S1/S2 with widely split S2, no S3/S4, no murmur.  No peripheral edema.  No carotid bruit.  Normal pedal pulses.  Abdomen: Soft, nontender, no hepatosplenomegaly, no distention.  Skin: Intact without lesions or rashes.  Neurologic: Alert and oriented x 3.  Psych: Normal affect. Extremities: No clubbing or cyanosis.  HEENT: Normal.   Assessment/Plan: 1. Atrial flutter:  Likely  related to prior cardiac surgery.  He had peri-operative AF at time of heart surgery but had not been noted to have recurrent arrhythmias until 8/21.  In 8/21, he was noted to be in atrial flutter with RVR, based on review of HR over the past few months, he had probably been in atrial flutter since at least 5/21. He had DCCV in 8/21 and is in NSR today. He saw EP, ablation offered if flutter recurs.  - Continue Eliquis 5 mg bid.   - With 1st degree AVB increased to 330 msec, I will decrease Toprol XL to 100 qam/50 qpm.  2.  History of Ross Procedure: Echo today showed pulmonary autograft in aortic position with trivial AI, no AS.  The bioprosthetic pulmonary valve appeared normal.   - Needs endocarditis prophylaxis with dental work.   3.  Aortic root aneurym: This not uncommonly accompanies Ross procedure.  CTA chest in 10/24 showed a left sinus of Valsalva aneurysm with the aortic root measuring 5.6 cm including the aneurysm; there was a prior graft repair of the ascending aorta with residual 4.5 cm ascneding aorta dilation proximal to the graft repair.  - Following with TCTS, to see Dr. Dorris Fetch tomorrow.  Aortic root is significantly dilated but has been stable. May be nearing need for elective repair.  - Goal SBP 120s or lower.  4. HTN: Given aortic root/ascending aorta aneurysm, need to make sure that BP is well-controlled.  - As above, cutting back on Toprol XL with profound 1st degree AVB.  - With decrease in Toprol XL, I will increase losartan to 25 mg daily with BMET in 10 days.  - Continue spironolactone 25 mg daily.    5. Hyperlipidemia: Continue current Crestor, good LDL in 9/24.  6. Chronic systolic CHF: TEE in 8/21 in setting of long-standing atrial flutter with RVR showed EF down to 40%. 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  He was cardioverted in 8/21 but developed exertional dyspnea with volume overload afterwards.  10/21 echo showed EF up to 55-60%, suggesting that he indeed had a  tachy-mediated CMP.  Echo in 12/22 showed EF 50% with mild RV dysfunction. Echo today showed EF 50%, mild RV dysfunction.  He is not  volume overloaded on exam, NYHA class I-II symptoms now.     - Continue Lasix 20 mg qod. BMET today.  - Continue Toprol XL at lower dose as above.  - Increase losartan to 25 mg daily.   - Continue spironolactone 25 mg daily. - Continue Jardiance 10 mg daily.  7. OSA: Severe by sleep study.  Refuses CPAP.  I suspect this is a major contributor to his daytime sleepiness and fatigue.  8. Diabetes: on metformin and Jardiance.   Followup in 4 months.   Marca Ancona 05/24/2023

## 2023-05-25 ENCOUNTER — Ambulatory Visit: Payer: PPO | Admitting: Thoracic Surgery (Cardiothoracic Vascular Surgery)

## 2023-05-25 ENCOUNTER — Encounter: Payer: Self-pay | Admitting: Thoracic Surgery (Cardiothoracic Vascular Surgery)

## 2023-05-25 VITALS — BP 150/87 | HR 62 | Resp 20 | Ht 75.0 in | Wt 220.0 lb

## 2023-05-25 DIAGNOSIS — I7121 Aneurysm of the ascending aorta, without rupture: Secondary | ICD-10-CM

## 2023-05-25 NOTE — Progress Notes (Signed)
[address]       [name] [address]             [phone]     HPI: [name] returns for follow-up of his aortic root and ascending aneurysms  [name] [name] is a 72 year old man with a history of congenital aortic insufficiency, Ross procedure, neoaortic root and ascending aneurysms, hiatal hernia, reflux, esophageal stricture, esophageal diverticulum, hypertension, left brachial plexopathy, and elevated left hemidiaphragm.  Underwent Ross procedure in [name] in 1994.  Found to have a 5.4 cm aortic root aneurysm in 2015.  Followed by Dr. Tyrone Sage for several years.  I have been seeing him for the last couple of years.  I last saw him and March 2024.  He was doing well at that time.  In the interim his primary issues have been swallowing related.  He has a Zenker's diverticulum.  He has had an esophageal dilatation which helped briefly.  Going back for another dilatation in a month and possible Botox injection as well.  Denies chest pain, pressure, tightness, or shortness of breath.  He had an echocardiogram on 05/24/2023 which showed preserved left regular function.  There was no significant aortic stenosis or insufficiency.  Past Medical History:  Diagnosis Date   Acute gastritis    Aortic insufficiency    s/p Ross Procedure: AVR/Pulmonary autograft in aortic position & PVR with bioprosthetic PV 1994 in [name]   Aortic root aneurysm    Atrial flutter (HCC)    history- no atrial flutter since cardioversion   CHF (congestive heart failure) (HCC)    Diabetes mellitus without complication (HCC)    type 2   Early satiety    Esophageal stricture    GERD (gastroesophageal reflux disease)    Headache syndrome 10/18/2018   Hiatal hernia    Hyperlipidemia    Neuropathy    left brachial plexus   Pain, dental    SCC (squamous cell carcinoma) 02/10/2023   right forearm   Sleep apnea    09/12/20 AHI 35.2/hr. Does not use CPAP. (03/26/22)     Current  Outpatient Medications  Medication Sig Dispense Refill   apixaban (ELIQUIS) 5 MG TABS tablet Take 1 tablet (5 mg total) by mouth 2 (two) times daily. 180 tablet 3   azelastine (ASTELIN) 0.1 % nasal spray Place 2 sprays into both nostrils 2 (two) times daily. 30 mL 12   cetirizine (ZYRTEC) 10 MG tablet TAKE 1 TABLET(10 MG) BY MOUTH DAILY 90 tablet 0   empagliflozin (JARDIANCE) 10 MG TABS tablet Take 1 tablet (10 mg total) by mouth daily before breakfast. 90 tablet 3   famotidine (PEPCID) 40 MG tablet Take 40 mg by mouth as needed for heartburn or indigestion.     finasteride (PROSCAR) 5 MG tablet TAKE 1 TABLET(5 MG) BY MOUTH DAILY 90 tablet 1   fluticasone (FLONASE) 50 MCG/ACT nasal spray Place 2 sprays into both nostrils as needed for allergies or rhinitis. 50 mL 30   furosemide (LASIX) 20 MG tablet TAKE 1 TABLET(20 MG) BY MOUTH EVERY OTHER DAY 15 tablet 11   guaiFENesin (MUCINEX) 600 MG 12 hr tablet Take by mouth 2 (two) times daily.     ipratropium (ATROVENT) 0.06 % nasal spray Place 2 sprays into both nostrils 4 (four) times daily. 15 mL 12   losartan (COZAAR) 25 MG tablet Take 1 tablet (25 mg total) by mouth daily. 90 tablet 3   metoprolol succinate (TOPROL-XL) 100  MG 24 hr tablet Take 1 tablet (100 mg total) by mouth every morning AND 0.5 tablets (50 mg total) every evening. Take with or immediately following a meal.. 180 tablet 3   montelukast (SINGULAIR) 10 MG tablet TAKE 1 TABLET(10 MG) BY MOUTH AT BEDTIME 30 tablet 6   pantoprazole (PROTONIX) 40 MG tablet Take 1 tablet (40 mg total) by mouth daily. 90 tablet 1   rosuvastatin (CRESTOR) 40 MG tablet TAKE 1 TABLET(40 MG) BY MOUTH DAILY 90 tablet 3   spironolactone (ALDACTONE) 25 MG tablet TAKE 1 TABLET(25 MG) BY MOUTH DAILY 30 tablet 0   No current facility-administered medications for this visit.    Physical Exam BP (!) 150/87   Pulse 62   Resp 20   Ht 6\' 3"  (1.905 m)   Wt 220 lb (99.8 kg)   SpO2 98% Comment: RA  BMI 27.50 kg/m   Well-appearing 72 year old man in no acute distress Alert and oriented x 3 with no focal deficits Lungs clear bilaterally Cardiac regular rate and rhythm with no murmur No peripheral edema  Diagnostic Tests: CT ANGIOGRAPHY CHEST WITH CONTRAST   TECHNIQUE: Multidetector CT imaging of the chest was performed using the standard protocol during bolus administration of intravenous contrast. Multiplanar CT image reconstructions and MIPs were obtained to evaluate the vascular anatomy.   RADIATION DOSE REDUCTION: This exam was performed according to the departmental dose-optimization program which includes automated exposure control, adjustment of the mA and/or kV according to patient size and/or use of iterative reconstruction technique.   CONTRAST:  75mL ISOVUE-370 IOPAMIDOL (ISOVUE-370) INJECTION 76%   COMPARISON:  CTA chest dated October 08, 2022   FINDINGS: Cardiovascular: Prior graft repair of the ascending thoracic aorta with residual aneurysmal dilation of the proximal ascending thoracic aorta measuring up to 4.5 cm. Prior aortic valve replacement. Similar aneurysmal outpouching of the left sinus of Valsalva measuring up to 2.0 cm. No suspicious filling defects of the pulmonary arteries. Severe coronary artery calcifications.   Mediastinum/Nodes: Esophagus and thyroid are unremarkable. Calcified mediastinal and right hilar lymph nodes. No enlarged lymph nodes seen in the chest.   Lungs/Pleura: Central airways are patent. Calcified granuloma of the right lower lobe. Elevation of the left hemidiaphragm with adjacent atelectasis. No consolidation, pleural effusion or pneumothorax.   Upper Abdomen: Nonobstructing stone of the left kidney. New wrist calcifications of the spleen, likely sequela of prior granulomatous infection.   Musculoskeletal: Prior median sternotomy with intact sternal wires. No acute or significant osseous findings.   Review of the MIP images confirms the  above findings.   IMPRESSION: 1. Prior graft repair of the ascending thoracic aorta with residual aneurysmal dilation of the proximal ascending thoracic aorta measuring up to 4.5 cm, unchanged when compared with the prior exam. 2. Similar aneurysmal outpouching of the left sinus of Valsalva measuring up to 2.0 cm. 3. Coronary artery calcifications and aortic Atherosclerosis (ICD10-I70.0).     Electronically Signed   By: Allegra Lai M.D.   On: 05/19/2023 11:50 I personally reviewed the CT images.  No change in appearance size of neoaortic root or neoascending aortic aneurysms. Coronary and aortic atherosclerosis  Impression: [name] is a 72 year old man with a history of congenital aortic insufficiency, Ross procedure, neoaortic root and ascending aneurysms, hiatal hernia, reflux, esophageal stricture, esophageal diverticulum, hypertension, left brachial plexopathy, and elevated left hemidiaphragm.  Aortic root and ascending aneurysms-unchanged over multiple CTs.  The aortic root issue really is more related to the left coronary button and to  a lesser extent the right coronary button.  Aneurysm itself measures about 4.5 cm.  As it is unchanged, there is no more indication for surgery today than there was 6 months ago.  It would not be totally unreasonable to operate but given stability over multiple years there is no immediate indication to do so.  Hypertension - blood pressure slightly elevated today.  Just had some blood pressure medication adjustments yesterday.  Really too early to have much of an effect.  Recommended that he monitor that at home.  Dysphagia-being managed by ENT.  Apparently is scheduled for another esophageal dilatation and possible Botox injection next month.  Plan: Return in 6 months with CT angiogram of chest  I spent over 20 minutes in review of records, images, and in consultation with Mr. Sprauer today. Loreli Slot, MD [facility] [phone]

## 2023-06-02 ENCOUNTER — Encounter (HOSPITAL_COMMUNITY): Payer: Self-pay | Admitting: Cardiology

## 2023-06-03 ENCOUNTER — Other Ambulatory Visit (HOSPITAL_COMMUNITY): Payer: PPO

## 2023-06-04 ENCOUNTER — Ambulatory Visit (HOSPITAL_COMMUNITY)
Admission: RE | Admit: 2023-06-04 | Discharge: 2023-06-04 | Disposition: A | Discharge status: Home / Self Care | Payer: PPO | Source: Ambulatory Visit | Attending: Internal Medicine | Admitting: Internal Medicine

## 2023-06-04 DIAGNOSIS — I5022 Chronic systolic (congestive) heart failure: Secondary | ICD-10-CM | POA: Diagnosis not present

## 2023-06-04 LAB — BASIC METABOLIC PANEL
Anion gap: 9 (ref 5–15)
BUN: 17 mg/dL (ref 8–23)
CO2: 22 mmol/L (ref 22–32)
Calcium: 9.2 mg/dL (ref 8.9–10.3)
Chloride: 103 mmol/L (ref 98–111)
Creatinine, Ser: 0.84 mg/dL (ref 0.61–1.24)
GFR, Estimated: >60 mL/min (ref >60)
Glucose, Bld: 179 mg/dL — ABNORMAL HIGH (ref 70–99)
Potassium: 4 mmol/L (ref 3.5–5.1)
Sodium: 134 mmol/L — ABNORMAL LOW (ref 135–145)

## 2023-06-11 ENCOUNTER — Other Ambulatory Visit (HOSPITAL_COMMUNITY): Payer: Self-pay | Admitting: Cardiology

## 2023-06-11 ENCOUNTER — Other Ambulatory Visit: Payer: Self-pay | Admitting: Family Medicine

## 2023-06-14 ENCOUNTER — Encounter: Payer: Self-pay | Admitting: Family Medicine

## 2023-06-14 ENCOUNTER — Encounter (HOSPITAL_COMMUNITY): Payer: Self-pay | Admitting: Cardiology

## 2023-06-15 NOTE — Telephone Encounter (Signed)
Pt has appt for 06/16/2023

## 2023-06-16 ENCOUNTER — Encounter: Payer: Self-pay | Admitting: Family Medicine

## 2023-06-16 ENCOUNTER — Telehealth (INDEPENDENT_AMBULATORY_CARE_PROVIDER_SITE_OTHER): Payer: PPO | Admitting: Family Medicine

## 2023-06-16 DIAGNOSIS — R519 Headache, unspecified: Secondary | ICD-10-CM | POA: Diagnosis not present

## 2023-06-16 NOTE — Progress Notes (Signed)
Virtual Visit via Video   I connected with patient on 06/16/23 at  9:40 AM EST by a video enabled telemedicine application and verified that I am speaking with the correct person using two identifiers.  Location patient: Home Location provider: Astron[facility], Office Persons participating in the virtual visit: Patient, Provider, CMA Archie Patten H)  I discussed the limitations of evaluation and management by telemedicine and the availability of in person appointments. The patient expressed understanding and agreed to proceed.  Subjective:   HPI:   HA's- sxs started ~1 week ago.  'giant headaches that won't go away'.  3 weeks ago had a temporary crown placed and then got the permanent crown yesterday.  Was told by the dentist that there was inflammation present.  HA's are frontal.  No visual changes.  + light sensitivity.  No sensitivity to sound.  No nausea associated w/ HA.  BP has been well controlled.  No sinus congestion or pressure.  No relief w/ Tylenol.  Pt was able to sleep through the night last night w/o waking for medication.  Previous night needed meds.    ROS:   See pertinent positives and negatives per HPI.  Patient Active Problem List   Diagnosis Date Noted   Hyperlipidemia 08/08/2008    Priority: High   Essential hypertension 08/08/2008    Priority: High   Dysphagia 10/29/2022   Refractory chronic cough 07/22/2022   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   Aortic atherosclerosis (HCC) 01/19/2022   Other allergic rhinitis 12/31/2021   Degenerative disc disease, lumbar 04/04/2021   Abdominal bloating 12/14/2019   Controlled diabetes mellitus type 2 with complications (HCC) 12/14/2019   Headache syndrome 10/18/2018   Laryngopharyngeal reflux (LPR) 08/10/2016   BPH (benign prostatic hyperplasia) 06/03/2015   H/O Ross procedure 01/31/2015   Thoracic aortic aneurysm (HCC) 12/11/2013   Ganglion cyst 06/01/2013   Brachial plexus neuropathy 10/07/2011   Cervical disc  disease 10/07/2011   General medical examination 03/11/2011   Carpopedal spasm 02/25/2011   Arthritis of hand 02/25/2011   CARPAL TUNNEL SYNDROME, BILATERAL 06/10/2009   AORTIC VALVE REPLACEMENT, HX OF 06/10/2009   ESOPHAGEAL STRICTURE 03/29/2003   GERD 03/29/2003   HIATAL HERNIA 10/21/2000    Social History   Tobacco Use   Smoking status: Never    Passive exposure: Current (wife smokes in car)   Smokeless tobacco: Never  Substance Use Topics   Alcohol use: No    Current Outpatient Medications:    apixaban (ELIQUIS) 5 MG TABS tablet, Take 1 tablet (5 mg total) by mouth 2 (two) times daily., Disp: 180 tablet, Rfl: 3   azelastine (ASTELIN) 0.1 % nasal spray, Place 2 sprays into both nostrils 2 (two) times daily., Disp: 30 mL, Rfl: 12   cetirizine (ZYRTEC) 10 MG tablet, TAKE 1 TABLET(10 MG) BY MOUTH DAILY, Disp: 90 tablet, Rfl: 0   empagliflozin (JARDIANCE) 10 MG TABS tablet, Take 1 tablet (10 mg total) by mouth daily before breakfast., Disp: 90 tablet, Rfl: 3   famotidine (PEPCID) 40 MG tablet, Take 40 mg by mouth as needed for heartburn or indigestion., Disp: , Rfl:    finasteride (PROSCAR) 5 MG tablet, TAKE 1 TABLET(5 MG) BY MOUTH DAILY, Disp: 90 tablet, Rfl: 1   fluticasone (FLONASE) 50 MCG/ACT nasal spray, Place 2 sprays into both nostrils as needed for allergies or rhinitis., Disp: 50 mL, Rfl: 30   furosemide (LASIX) 20 MG tablet, TAKE 1 TABLET(20 MG) BY MOUTH EVERY OTHER DAY, Disp: 15 tablet, Rfl: 11  guaiFENesin (MUCINEX) 600 MG 12 hr tablet, Take by mouth 2 (two) times daily., Disp: , Rfl:    ipratropium (ATROVENT) 0.06 % nasal spray, Place 2 sprays into both nostrils 4 (four) times daily., Disp: 15 mL, Rfl: 12   losartan (COZAAR) 25 MG tablet, Take 1 tablet (25 mg total) by mouth daily., Disp: 90 tablet, Rfl: 3   metoprolol succinate (TOPROL-XL) 100 MG 24 hr tablet, Take 1 tablet (100 mg total) by mouth every morning AND 0.5 tablets (50 mg total) every evening. Take with or  immediately following a meal.., Disp: 180 tablet, Rfl: 3   montelukast (SINGULAIR) 10 MG tablet, TAKE 1 TABLET(10 MG) BY MOUTH AT BEDTIME, Disp: 30 tablet, Rfl: 6   pantoprazole (PROTONIX) 40 MG tablet, Take 1 tablet (40 mg total) by mouth daily., Disp: 90 tablet, Rfl: 1   rosuvastatin (CRESTOR) 40 MG tablet, TAKE 1 TABLET(40 MG) BY MOUTH DAILY, Disp: 90 tablet, Rfl: 3   spironolactone (ALDACTONE) 25 MG tablet, TAKE 1 TABLET(25 MG) BY MOUTH DAILY, Disp: 90 tablet, Rfl: 3  Allergies  Allergen Reactions   Penicillins Hives and Swelling   Quinidine Other (See Comments)    Increase heart beat per patient    Objective:   There were no vitals taken for this visit. AAOx3, NAD NCAT, EOMI No obvious CN deficits Coloring WNL Pt is able to speak clearly, coherently without shortness of breath or increased work of breathing.  Thought process is linear.  Mood is appropriate.   Assessment and Plan:   Frontal HA- new.  Sxs started shortly after he got a temporary crown placed on his back tooth.  HA's were severe and not improved w/ Tylenol.  Yesterday, he had the permanent crown placed and was told there was inflammation present.  Since the permanent crown placed, HA's have improved.  Was able to sleep through the night last night and today sxs are better w/ Tylenol.  Pt to monitor for continued improvement and let me know.  Pt expressed understanding and is in agreement w/ plan.    Neena Rhymes, MD 06/16/2023

## 2023-06-24 DIAGNOSIS — K224 Dyskinesia of esophagus: Secondary | ICD-10-CM | POA: Diagnosis not present

## 2023-06-24 DIAGNOSIS — J392 Other diseases of pharynx: Secondary | ICD-10-CM | POA: Diagnosis not present

## 2023-06-24 DIAGNOSIS — R1310 Dysphagia, unspecified: Secondary | ICD-10-CM | POA: Diagnosis not present

## 2023-06-24 DIAGNOSIS — R1313 Dysphagia, pharyngeal phase: Secondary | ICD-10-CM | POA: Diagnosis not present

## 2023-06-24 DIAGNOSIS — R1314 Dysphagia, pharyngoesophageal phase: Secondary | ICD-10-CM | POA: Diagnosis not present

## 2023-06-28 ENCOUNTER — Encounter: Payer: Self-pay | Admitting: Family Medicine

## 2023-06-30 ENCOUNTER — Encounter: Payer: Self-pay | Admitting: Family Medicine

## 2023-06-30 ENCOUNTER — Telehealth: Payer: PPO | Admitting: Family Medicine

## 2023-06-30 ENCOUNTER — Ambulatory Visit (HOSPITAL_BASED_OUTPATIENT_CLINIC_OR_DEPARTMENT_OTHER)
Admission: RE | Admit: 2023-06-30 | Discharge: 2023-06-30 | Disposition: A | Discharge status: Home / Self Care | Payer: PPO | Source: Ambulatory Visit | Attending: Family Medicine | Admitting: Family Medicine

## 2023-06-30 VITALS — BP 145/95 | Ht 75.0 in

## 2023-06-30 DIAGNOSIS — R519 Headache, unspecified: Secondary | ICD-10-CM

## 2023-06-30 MED ORDER — PREDNISONE 10 MG PO TABS: ORAL_TABLET | ORAL | 0 refills | Status: DC

## 2023-06-30 NOTE — Progress Notes (Signed)
Virtual Visit via Video   I connected with patient on 06/30/23 at  9:40 AM EST by a video enabled telemedicine application and verified that I am speaking with the correct person using two identifiers.  Location patient: Home Location provider: Astronomer, Office Persons participating in the virtual visit: Patient, Provider, CMA Archie Patten H)  I discussed the limitations of evaluation and management by telemedicine and the availability of in person appointments. The patient expressed understanding and agreed to proceed.  Subjective:   HPI:   Headaches- 'i don't know what these headaches are'.  Has been taking Tylenol w/ some relief.  Last visit he thought it was dental related but this is not the case.  HA's are bi-temporal.  Pt reports he is able to sleep but he will wake w/ HA.  + sensitivity to light.  No N/V, dizziness.  No pain w/ chewing or palpation over temporal arteries.  Denies increased stress level.  Sxs started over 3 weeks ago.  No hx of migraines.  ROS:   See pertinent positives and negatives per HPI.  Patient Active Problem List   Diagnosis Date Noted   Hyperlipidemia 08/08/2008    Priority: High   Essential hypertension 08/08/2008    Priority: High   Dysphagia 10/29/2022   Refractory chronic cough 07/22/2022   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   Aortic atherosclerosis (HCC) 01/19/2022   Other allergic rhinitis 12/31/2021   Degenerative disc disease, lumbar 04/04/2021   Abdominal bloating 12/14/2019   Controlled diabetes mellitus type 2 with complications (HCC) 12/14/2019   Headache syndrome 10/18/2018   Laryngopharyngeal reflux (LPR) 08/10/2016   BPH (benign prostatic hyperplasia) 06/03/2015   H/O Ross procedure 01/31/2015   Thoracic aortic aneurysm (HCC) 12/11/2013   Ganglion cyst 06/01/2013   Brachial plexus neuropathy 10/07/2011   Cervical disc disease 10/07/2011   General medical examination 03/11/2011   Carpopedal spasm 02/25/2011    Arthritis of hand 02/25/2011   CARPAL TUNNEL SYNDROME, BILATERAL 06/10/2009   AORTIC VALVE REPLACEMENT, HX OF 06/10/2009   ESOPHAGEAL STRICTURE 03/29/2003   GERD 03/29/2003   HIATAL HERNIA 10/21/2000    Social History   Tobacco Use   Smoking status: Never    Passive exposure: Current (wife smokes in car)   Smokeless tobacco: Never  Substance Use Topics   Alcohol use: No    Current Outpatient Medications:    apixaban (ELIQUIS) 5 MG TABS tablet, Take 1 tablet (5 mg total) by mouth 2 (two) times daily., Disp: 180 tablet, Rfl: 3   azelastine (ASTELIN) 0.1 % nasal spray, Place 2 sprays into both nostrils 2 (two) times daily., Disp: 30 mL, Rfl: 12   cetirizine (ZYRTEC) 10 MG tablet, TAKE 1 TABLET(10 MG) BY MOUTH DAILY, Disp: 90 tablet, Rfl: 0   empagliflozin (JARDIANCE) 10 MG TABS tablet, Take 1 tablet (10 mg total) by mouth daily before breakfast., Disp: 90 tablet, Rfl: 3   famotidine (PEPCID) 40 MG tablet, Take 40 mg by mouth as needed for heartburn or indigestion., Disp: , Rfl:    finasteride (PROSCAR) 5 MG tablet, TAKE 1 TABLET(5 MG) BY MOUTH DAILY, Disp: 90 tablet, Rfl: 1   fluticasone (FLONASE) 50 MCG/ACT nasal spray, Place 2 sprays into both nostrils as needed for allergies or rhinitis., Disp: 50 mL, Rfl: 30   furosemide (LASIX) 20 MG tablet, TAKE 1 TABLET(20 MG) BY MOUTH EVERY OTHER DAY, Disp: 15 tablet, Rfl: 11   guaiFENesin (MUCINEX) 600 MG 12 hr tablet, Take by mouth 2 (two) times daily., Disp: ,  Rfl:    ipratropium (ATROVENT) 0.06 % nasal spray, Place 2 sprays into both nostrils 4 (four) times daily., Disp: 15 mL, Rfl: 12   losartan (COZAAR) 25 MG tablet, Take 1 tablet (25 mg total) by mouth daily., Disp: 90 tablet, Rfl: 3   metoprolol succinate (TOPROL-XL) 100 MG 24 hr tablet, Take 1 tablet (100 mg total) by mouth every morning AND 0.5 tablets (50 mg total) every evening. Take with or immediately following a meal.., Disp: 180 tablet, Rfl: 3   montelukast (SINGULAIR) 10 MG tablet,  TAKE 1 TABLET(10 MG) BY MOUTH AT BEDTIME, Disp: 30 tablet, Rfl: 6   pantoprazole (PROTONIX) 40 MG tablet, Take 1 tablet (40 mg total) by mouth daily., Disp: 90 tablet, Rfl: 1   rosuvastatin (CRESTOR) 40 MG tablet, TAKE 1 TABLET(40 MG) BY MOUTH DAILY, Disp: 90 tablet, Rfl: 3   spironolactone (ALDACTONE) 25 MG tablet, TAKE 1 TABLET(25 MG) BY MOUTH DAILY, Disp: 90 tablet, Rfl: 3  Allergies  Allergen Reactions   Penicillins Hives and Swelling   Quinidine Other (See Comments)    Increase heart beat per patient    Objective:   BP (!) 145/95   Ht 6\' 3"  (1.905 m)   BMI 27.50 kg/m  AAOx3, NAD NCAT, EOMI No TTP over temporal arteries No obvious CN deficits Coloring WNL Pt is able to speak clearly, coherently without shortness of breath or increased work of breathing.  Thought process is linear.  Mood is appropriate.   Assessment and Plan:   Frontal HA- deteriorated.  Pt felt that at last visit his HA's were dental related.  This is not the case as they have persisted.  He wakes w/ HA's daily but they do not prevent him from falling asleep.  Denies dizziness, N/V.  Some photophobia but no phonophobia.  No hx of migraines.  Denies TTP over temporal arteries and no pain w/ chewing or yawning.  Has had a number of esophageal issues and wonders if this could be causing any of his sxs.  Will get CT head to assess for any mass or structural issues.  Will start Prednisone taper in hopes of breaking HA cycle (he was cautioned to watch his carb intake since he has diabetes).  If no improvement after prednisone, will refer to Neuro.  Pt expressed understanding and is in agreement w/ plan.    Neena Rhymes, MD 06/30/2023

## 2023-07-09 DIAGNOSIS — R519 Headache, unspecified: Secondary | ICD-10-CM

## 2023-07-09 MED ORDER — TRAMADOL HCL 50 MG PO TABS: 50.0000 mg | ORAL_TABLET | Freq: Three times a day (TID) | ORAL | 0 refills | Status: AC | PRN

## 2023-07-15 ENCOUNTER — Ambulatory Visit: Payer: PPO | Admitting: Family Medicine

## 2023-07-15 ENCOUNTER — Encounter: Payer: Self-pay | Admitting: Family Medicine

## 2023-07-15 VITALS — BP 112/64 | HR 60 | Temp 97.9°F | Ht 75.0 in | Wt 216.4 lb

## 2023-07-15 DIAGNOSIS — E118 Type 2 diabetes mellitus with unspecified complications: Secondary | ICD-10-CM | POA: Diagnosis not present

## 2023-07-15 DIAGNOSIS — Z7984 Long term (current) use of oral hypoglycemic drugs: Secondary | ICD-10-CM

## 2023-07-15 LAB — HEMOGLOBIN A1C: Hgb A1c MFr Bld: 8.1 % — ABNORMAL HIGH (ref 4.6–6.5)

## 2023-07-15 LAB — BASIC METABOLIC PANEL
BUN: 13 mg/dL (ref 6–23)
CO2: 26 meq/L (ref 19–32)
Calcium: 9.2 mg/dL (ref 8.4–10.5)
Chloride: 103 meq/L (ref 96–112)
Creatinine, Ser: 0.83 mg/dL (ref 0.40–1.50)
GFR: 88.37 mL/min (ref >60.00)
Glucose, Bld: 200 mg/dL — ABNORMAL HIGH (ref 70–99)
Potassium: 4.6 meq/L (ref 3.5–5.1)
Sodium: 137 meq/L (ref 135–145)

## 2023-07-15 NOTE — Patient Instructions (Signed)
Schedule your complete physical for after 3/12 We'll notify you of your lab results and make any changes if needed Keep up the good work on healthy diet and regular exercise- you look great! Call with any questions or concerns Stay Safe!  Stay Healthy! Happy Birthday! Merry Christmas!!!

## 2023-07-15 NOTE — Assessment & Plan Note (Signed)
Chronic problem.  Currently on Jardiance 10mg  daily.  UTD on eye exam, foot exam, microalbumin.  Asymptomatic w/ exception of ongoing headache.  Check labs.  Adjust meds prn

## 2023-07-15 NOTE — Progress Notes (Signed)
   Subjective:    Patient ID: [name], male    DOB: 1951-06-24, 72 y.o.   MRN: [id number]  HPI DM- chronic problem, on Jardiance 10mg .  UTD on foot exam, eye exam, microalbumin.  No CP, SOB, visual changes.  + ongoing HA.  No abd pain, N/V.  Denies symptomatic lows.  No numbness/tingling of hands/feet.   Review of Systems For ROS see HPI     Objective:   Physical Exam Vitals reviewed.  Constitutional:      General: He is not in acute distress.    Appearance: Normal appearance. He is well-developed. He is not ill-appearing.  HENT:     Head: Normocephalic and atraumatic.  Eyes:     Extraocular Movements: Extraocular movements intact.     Conjunctiva/sclera: Conjunctivae normal.     Pupils: Pupils are equal, round, and reactive to light.  Neck:     Thyroid: No thyromegaly.  Cardiovascular:     Rate and Rhythm: Normal rate and regular rhythm.     Pulses: Normal pulses.     Comments: Valvular click Pulmonary:     Effort: Pulmonary effort is normal. No respiratory distress.     Breath sounds: Normal breath sounds.  Abdominal:     General: Bowel sounds are normal. There is no distension.     Palpations: Abdomen is soft.  Musculoskeletal:     Cervical back: Normal range of motion and neck supple.     Right lower leg: No edema.     Left lower leg: No edema.  Lymphadenopathy:     Cervical: No cervical adenopathy.  Skin:    General: Skin is warm and dry.  Neurological:     General: No focal deficit present.     Mental Status: He is alert and oriented to person, place, and time.     Cranial Nerves: No cranial nerve deficit.  Psychiatric:        Mood and Affect: Mood normal.        Behavior: Behavior normal.           Assessment & Plan:

## 2023-07-16 ENCOUNTER — Telehealth: Payer: Self-pay

## 2023-07-16 ENCOUNTER — Telehealth: Payer: Self-pay | Admitting: Family Medicine

## 2023-07-16 NOTE — Telephone Encounter (Signed)
-----   Message from Neena Rhymes sent at 07/16/2023  7:37 AM EST ----- Your A1C has increased to 8.1%  This indicates that your sugar control is not where we want it to be.  Continue the Jardiance daily and we will add Januvia 100mg  daily (#30, 3 refills)

## 2023-07-16 NOTE — Telephone Encounter (Signed)
Error

## 2023-07-16 NOTE — Telephone Encounter (Signed)
Pt returned call, stated he saw his lab results through MyChart and he does not have any questions at this time.

## 2023-07-16 NOTE — Telephone Encounter (Signed)
Lvm for patient to call back regarding lab results.

## 2023-07-20 ENCOUNTER — Telehealth (HOSPITAL_COMMUNITY): Payer: Self-pay | Admitting: Pharmacy Technician

## 2023-07-20 NOTE — Telephone Encounter (Signed)
Medication Samples have been provided to the patient.  Drug name: Eliquis       Strength: 5 mg        Qty: 4 boxes  LOT: [id number]  Exp.Date: 08/2024  Dosing instructions: Take 1 tablet by mouth twice daily.   The patient has been instructed regarding the correct time, dose, and frequency of taking this medication, including desired effects and most common side effects.   Allen Kell [facility] 11:43 AM 07/20/2023

## 2023-07-21 ENCOUNTER — Ambulatory Visit: Payer: PPO | Admitting: Gastroenterology

## 2023-07-27 ENCOUNTER — Other Ambulatory Visit: Payer: Self-pay

## 2023-07-30 ENCOUNTER — Other Ambulatory Visit: Payer: Self-pay | Admitting: Internal Medicine

## 2023-07-30 ENCOUNTER — Encounter (HOSPITAL_COMMUNITY): Payer: Self-pay | Admitting: Cardiology

## 2023-07-30 MED ORDER — LOSARTAN POTASSIUM 25 MG PO TABS: 25.0000 mg | ORAL_TABLET | Freq: Every day | ORAL | 3 refills | Status: AC

## 2023-08-11 ENCOUNTER — Other Ambulatory Visit: Payer: Self-pay

## 2023-08-11 DIAGNOSIS — G540 Brachial plexus disorders: Secondary | ICD-10-CM

## 2023-08-11 MED ORDER — FINASTERIDE 5 MG PO TABS: ORAL_TABLET | ORAL | 1 refills | Status: DC

## 2023-08-17 ENCOUNTER — Other Ambulatory Visit (HOSPITAL_COMMUNITY): Payer: Self-pay | Admitting: Cardiology

## 2023-08-19 ENCOUNTER — Encounter (HOSPITAL_COMMUNITY): Payer: Self-pay | Admitting: Cardiology

## 2023-08-19 ENCOUNTER — Ambulatory Visit: Payer: PPO | Admitting: Neurology

## 2023-08-19 ENCOUNTER — Encounter: Payer: Self-pay | Admitting: Neurology

## 2023-08-19 VITALS — BP 133/74 | HR 62 | Ht 75.0 in | Wt 219.0 lb

## 2023-08-19 DIAGNOSIS — G4486 Cervicogenic headache: Secondary | ICD-10-CM | POA: Diagnosis not present

## 2023-08-19 DIAGNOSIS — Q283 Other malformations of cerebral vessels: Secondary | ICD-10-CM

## 2023-08-19 DIAGNOSIS — G4733 Obstructive sleep apnea (adult) (pediatric): Secondary | ICD-10-CM

## 2023-08-19 DIAGNOSIS — R519 Headache, unspecified: Secondary | ICD-10-CM

## 2023-08-19 NOTE — Patient Instructions (Addendum)
It was nice to meet you today.   As discussed, your headaches are likely due to a combination of factors, including obstructive sleep apnea, neck related headaches due to degenerative neck disease, stress, possibly strain on the eyes.   Here is what we discussed today and my recommendations for you:   Please remember, common headache triggers are: sleep deprivation, dehydration, overheating, stress, hypoglycemia or skipping meals and blood sugar fluctuations, excessive pain medications or excessive alcohol use or caffeine withdrawal. Some people have food triggers such as aged cheese, orange juice or chocolate, especially dark chocolate, or MSG (monosodium glutamate). Try to avoid these headache triggers as much possible. It may be helpful to keep a headache diary to figure out what makes your headaches worse or brings them on and what alleviates them. Some people report headache onset after exercise but studies have shown that regular exercise may actually prevent headaches from coming. If you have exercise-induced headaches, please make sure that you drink plenty of fluid before and after exercising and that you do not over do it and do not overheat. Please avoid taking daily Tylenol or naproxen.   Limit your caffeine to 1 or 2 servings per day.  Hydrate well with water, 6 to 8 cups/day are recommended, 8 ounce size each.   Follow-up with your cardiologist to discuss sleep apnea treatment.   Follow-up with your neurosurgeon for surveillance of her your cavernoma.  You can ask your primary care for a referral back to Dr. Franky Macho for this.  Your next checkup with Dr. Beverely Low, I recommend that you have her check your inflammatory markers called sedimentation rate and CRP.  We can see you back in this clinic as needed.

## 2023-08-19 NOTE — Progress Notes (Signed)
Subjective:    Patient ID: [name] [name] is a 73 y.o. male.  HPI    Huston Foley, MD, PhD [facility] [address] [address] [place], [place] [address]  Dear Dr. Beverely Low,  I saw your patient, [name] [name], upon your kind request in my neurologic clinic today for initial consultation of his headaches.  The patient is unaccompanied today.  As you know, [name] [name] is a 73 year old male with an underlying medical history of CHF, aortic root aneurysm, atrial flutter, aortic insufficiency, reflux disease, brachial plexopathy on the left side, sleep apnea (not currently on PAP therapy), hyperlipidemia, squamous cell carcinoma, and overweight state, who reports recurrent headaches for the past 3 to 4 months.  He had nearly daily headaches in the beginning but these have improved.  He was treated with prednisone back in November and does not think the prednisone helped although I did explain to him that prednisone takes a while to kick in and lasts longer than the actual taper.  He reports that naproxen helps.  His headaches are bifrontal and bitemporal and not associated typically with photophobia or nausea or vomiting, sometimes he does get some light sensitivity.  He goes to [facility] ophthalmology for his eye care.  He is up-to-date with his eye examination, last test was in September or October 2024.  He hydrates well with water, he limits his caffeine to up to 2 cups of coffee in the morning.  He was diagnosed with obstructive sleep apnea but declined PAP therapy.  He does not wish to get treated for his obstructive sleep apnea with a PAP machine.  We talked about this at length and reviewed risks and ramifications of untreated OSA and symptoms related to OSA such as headaches.  He does wake up with headaches sometimes.  He tries to get 7 or 8 hours of sleep, sometimes longer.  His Epworth sleepiness score is 3 out of 24, fatigue severity score is 23 out of 63.  He  denies any sudden onset of one-sided weakness or numbness or tingling or droopy face or slurring of speech.  His headaches have improved.  I reviewed his home sleep test report from study date 09/12/2020.  He was diagnosed with severe obstructive sleep apnea with an AHI of 35.2/h.  O2 nadir was 85% with mild to moderate snoring detected.  For his cavernoma he has not seen his neurosurgeon in some time.    I reviewed your video visit note from 06/30/2023.  He was given a prednisone taper at the time.   He has been taking tramadol as needed.  O he has tried over-the-counter Tylenol.  Wakes up with a headache often.  He reports sensitivity to light, no nausea or vomiting.  He had a head CT without contrast on 07/09/2023 and I reviewed the results:  IMPRESSION: No acute intracranial abnormality. Largely normal for age noncontrast CT appearance of the Brain. In addition, I personally and independently reviewed images through the PACS system.  There were white matter changes and perhaps slightly advanced for age global atrophy noted.  He had consulted with Dr. Anne Hahn several years ago for recurrent headaches.  I reviewed the office visit note and copied the note below for reference.  He had also seen Dr. Modesto Charon with neurology in the past for neuropathy.  He had a brain MRI with and without contrast on 09/16/2018 and I reviewed the results:   IMPRESSION: 1. No acute intracranial abnormality or cause of  headaches identified. 2. Unchanged small focus of chronic hemorrhage in the left occipital lobe. 3. Mild-to-moderate cerebral white matter disease, unchanged and nonspecific though may reflect chronic small vessel ischemia or migraines.  He had a brain MRI with and without contrast on 01/21/2012 for indication of cavernous angioma.  Study was ordered by Dr. Coletta Memos.  I reviewed the results:  IMPRESSION:  Small focus of chronic hemorrhage left occipital parietal lobe is  unchanged.  This may be an  area of hemorrhage from a cavernous  angioma.  This could also be a small hypertensive bleed or area of  post traumatic hemorrhage.  No other areas of hemorrhage are  present.   Chronic white matter lesions are stable bilaterally and are likely  related to chronic microvascular ischemia.  No acute infarct.   He had a cervical spine MRI without contrast for indication of neck and right arm pain and weakness, as ordered by Dr. Denton Meek on 05/03/2011 and I reviewed the results: IMPRESSION:   Degenerative cervical spondylosis with disc disease and facet  disease.  Marked osteophytic ridging and uncinate spurring  contribute to significant multilevel foraminal stenosis as  discussed above at the individual levels.   I reviewed recent blood work through his chart.  His A1c on 07/15/2023 was elevated at 8.1.  His TSH was normal at 2.41 on 04/15/2023.  He did not have a recent CRP or ESR.  Previously:  10/18/2018 (Dr. Anne Hahn): <<[name] [name] is a 73 year old right-handed white male with a history of headaches that began in December 2019.  The patient had spontaneous onset of the headache, there were no medication adjustments or dental procedures around the time of the headache onset.  The patient had severe bitemporal pain with some tenderness with palpation of the temporal areas that was constant in nature.  The patient took over-the-counter medication such as naproxen and Tylenol without benefit, he was given a trial on tizanidine which did not help.  The patient was seen by his primary care physician who obtained MRI of the brain which was unremarkable.  The patient has had gradual spontaneous improvement of his headache, he now has no headache unless he is chewing or eating which may bring on some mild bitemporal discomfort.  The patient denies any restriction of opening the mouth.  He was placed on a course of prednisone around the time that the headache was severe without any benefit.  The patient  denies any pre-existing personal history of headache and no family history of headache.  He reported no numbness or weakness of the face, arms, legs.  He has had a prior upper brachial plexopathy that was painless and onset affecting the left arm in 2006, he was seen by Dr. Janan Ridge previously for this.  He has permanent proximal weakness of the left arm.  He also reports a several year history of brief intermittent episodes of vertigo that may last anywhere from 10 seconds to 60 seconds and then go away.  This has been present for 10 to 12 years.  He also reports rare events of decreased vision in 1 eye, he cannot member which, this may occur independently of the episodes of vertigo.  The patient has undergone MRI evaluation of the brain that was unremarkable, he is sent to this office for an evaluation.  A sedimentation rate recently checked was normal at 11.>>  11/02/2011 (Dr. Denton Meek): <<I saw Mr. Yaviel Malley back in clinic today for follow up. As you know  you sent him to me for consultation on episodes of transient visual loss in his right eye, as well as spells of transient vertigo. In addition he has has a history of an idiopathic left brachial plexopathy which has left him without the use of his left arm. He also described that he was having problems with the grip strength in is right hand.    I got an MRI of his brain and MRA of his head and neck. 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 The MRA was unremarkable. I also got an EEG with the theory that these visual loss spells are possibly seizures.  The EEG was normal.. Finally in order to address the right hand weakness I got an EMG which revealed a chronic C8 radiculopathy. A subsequent MRI C-spine revealed significant degenerative spine disease affecting most prominently the C5-C6 and C6-C7 levels with foraminal stenosis at both levels.    I sent him to Dr. Barbaraann Barthel for  evaluation of his cavernoma as well as his C-spine disease.  By report however, Dr. Mikal Plane felt that an intervention of the spine targetting C5-C7 was an option, presumedly a laminectomy and fusion. He was not concerned about the occipital cavernoma but did want to follow it up in 6 months with a repeat MRI.   Since I last saw him he continues to have relatively frequent dizzy spells - where the world is visually flipped on its side.  They last less than a minute can come on without provocation or change in head movement.  These happen a couple of times per week.  In addition he has rarer spells of right monocular vision loss -- I at first felt these were loss of his right visual field - but today he says he has tried to cover his eye and has characterized it as monocular.  He never has had a history of migraines.   He is continued to be followed by Dr. Mikal Plane.  His grip strength measured quantitatively has not changed.     Medical history, social history, and family history were reviewed and have not changed since the last clinic visit.   >>  His Past Medical History Is Significant For: Past Medical History:  Diagnosis Date   Acute gastritis    Aortic insufficiency    s/p Ross Procedure: AVR/Pulmonary autograft in aortic position & PVR with bioprosthetic PV 1994 in [place]   Aortic root aneurysm    Atrial flutter (HCC)    history- no atrial flutter since cardioversion   CHF (congestive heart failure) (HCC)    Diabetes mellitus without complication (HCC)    type 2   Early satiety    Esophageal stricture    GERD (gastroesophageal reflux disease)    Headache syndrome 10/18/2018   Hiatal hernia    Hyperlipidemia    Neuropathy    left brachial plexus   Pain, dental    SCC (squamous cell carcinoma) 02/10/2023   right forearm   Sleep apnea    09/12/20 AHI 35.2/hr. Does not use CPAP. (03/26/22)    His Past Surgical History Is Significant For: Past Surgical History:  Procedure  Laterality Date   AORTIC VALVE REPLACEMENT  1994   CARDIOVERSION N/A 03/14/2020   Procedure: CARDIOVERSION;  Surgeon: Laurey Morale, MD;  Location: [facility] ENDOSCOPY;  Service: Cardiovascular;  Laterality: N/A;   ESOPHAGEAL MANOMETRY N/A 09/23/2022   Procedure: ESOPHAGEAL MANOMETRY (EM);  Surgeon: Lavon Paganini,  Eleonore Chiquito, MD;  Location: [facility] ENDOSCOPY;  Service: Gastroenterology;  Laterality: N/A;   ESOPHAGOGASTRODUODENOSCOPY N/A 03/30/2022   Procedure: ESOPHAGOGASTRODUODENOSCOPY (EGD);  Surgeon: Corliss Skains, MD;  Location: [facility] OR;  Service: Thoracic;  Laterality: N/A;   GANGLION CYST EXCISION  JAN 2002   HIATAL HERNIA REPAIR  DEC 2002   INTERCOSTAL NERVE BLOCK Right 03/30/2022   Procedure: INTERCOSTAL NERVE BLOCK;  Surgeon: Corliss Skains, MD;  Location: [facility] OR;  Service: Thoracic;  Laterality: Right;   TEE WITHOUT CARDIOVERSION N/A 03/14/2020   Procedure: TRANSESOPHAGEAL ECHOCARDIOGRAM (TEE);  Surgeon: Laurey Morale, MD;  Location[facility] END[facility]COPY;  Service: Cardiovascular;  Laterality: N/A;   TONSILLECTOMY     VASECTOMY  DEC 2002    His Family History Is Significant For: Family History  Problem Relation Age of Onset   Lung cancer Father        hx of smoking    Leukemia Brother        Passed at age of 68   Hypertension Other    Prostate cancer Other    Colon cancer Neg Hx    Pancreatic cancer Neg Hx    Rectal cancer Neg Hx    Stomach cancer Neg Hx    Asthma Neg Hx    Immunodeficiency Neg Hx    Eczema Neg Hx    Atopy Neg Hx    Urticaria Neg Hx    Allergic rhinitis Neg Hx    Angioedema Neg Hx    Esophageal cancer Neg Hx    Migraines Neg Hx     His Social History Is Significant For: Social History   Socioeconomic History   Marital status: Married    Spouse name: Natalia Leatherwood    Number of children: 3   Years of education: Not on file   Highest education level: Master's degree (e.g., MA, MS, MEng, MEd, MSW, MBA)  Occupational History   Occupation: retired  Tobacco Use    Smoking status: Never    Passive exposure: Current (wife smokes in car)   Smokeless tobacco: Never  Vaping Use   Vaping status: Never Used  Substance and Sexual Activity   Alcohol use: No   Drug use: No   Sexual activity: Not on file  Other Topics Concern   Not on file  Social History Narrative   Caffeine 1-2 cups daily    Right handed   Live in [place] with spouse Natalia Leatherwood    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   Social Drivers of Corporate investment banker Strain: Low Risk  (07/11/2023)   Overall Financial Resource Strain (CARDIA)    Difficulty of Paying Living Expenses: Not very hard  Food Insecurity: No Food Insecurity (07/11/2023)   Hunger Vital Sign    Worried About Running Out of Food in the Last Year: Never true    Ran Out of Food in the Last Year: Never true  Transportation Needs: No Transportation Needs (07/11/2023)   PRAPARE - Administrator, Civil Service (Medical): No    Lack of Transportation (Non-Medical): No  Physical Activity: Insufficiently Active (07/11/2023)   Exercise Vital Sign    Days of Exercise per Week: 3 days    Minutes of Exercise per Session: 30 min  Stress: No Stress Concern Present (07/11/2023)   [employer] of Occupational Health - Occupational Stress Questionnaire    Feeling of Stress : Not at all  Social Connections: Moderately Isolated (07/11/2023)   Social Connection and  Isolation Panel [NHANES]    Frequency of Communication with Friends and Family: Three times a week    Frequency of Social Gatherings with Friends and Family: Once a week    Attends Religious Services: Never    Database administrator or Organizations: No    Attends Engineer, structural: Never    Marital Status: Married    His Allergies Are:  Allergies  Allergen Reactions   Penicillins Hives and Swelling   Quinidine Other (See Comments)    Increase heart beat per patient  :   His Current Medications Are:   Outpatient Encounter Medications as of 08/19/2023  Medication Sig   apixaban (ELIQUIS) 5 MG TABS tablet Take 1 tablet (5 mg total) by mouth 2 (two) times daily.   azelastine (ASTELIN) 0.1 % nasal spray Place 2 sprays into both nostrils 2 (two) times daily.   cetirizine (ZYRTEC) 10 MG tablet TAKE 1 TABLET(10 MG) BY MOUTH DAILY   empagliflozin (JARDIANCE) 10 MG TABS tablet Take 1 tablet (10 mg total) by mouth daily before breakfast.   famotidine (PEPCID) 40 MG tablet Take 40 mg by mouth as needed for heartburn or indigestion.   finasteride (PROSCAR) 5 MG tablet TAKE 1 TABLET(5 MG) BY MOUTH DAILY   fluticasone (FLONASE) 50 MCG/ACT nasal spray Place 2 sprays into both nostrils as needed for allergies or rhinitis.   furosemide (LASIX) 20 MG tablet TAKE 1 TABLET(20 MG) BY MOUTH EVERY OTHER DAY   guaiFENesin (MUCINEX) 600 MG 12 hr tablet Take by mouth 2 (two) times daily.   ipratropium (ATROVENT) 0.06 % nasal spray USE 2 SPRAYS IN EACH NOSTRIL FOUR TIMES DAILY (Patient taking differently: Daily in the morning)   losartan (COZAAR) 25 MG tablet Take 1 tablet (25 mg total) by mouth daily.   metoprolol succinate (TOPROL-XL) 100 MG 24 hr tablet Take 1 tablet (100 mg total) by mouth every morning AND 0.5 tablets (50 mg total) every evening. Take with or immediately following a meal..   montelukast (SINGULAIR) 10 MG tablet TAKE 1 TABLET(10 MG) BY MOUTH AT BEDTIME   pantoprazole (PROTONIX) 40 MG tablet Take 1 tablet (40 mg total) by mouth daily.   rosuvastatin (CRESTOR) 40 MG tablet TAKE 1 TABLET(40 MG) BY MOUTH DAILY   spironolactone (ALDACTONE) 25 MG tablet TAKE 1 TABLET(25 MG) BY MOUTH DAILY   SPIKEVAX syringe    [DISCONTINUED] predniSONE (DELTASONE) 10 MG tablet 3 tabs x3 days and then 2 tabs x3 days and then 1 tab x3 days.  Take w/ food. (Patient not taking: Reported on 08/19/2023)   No facility-administered encounter medications on file as of 08/19/2023.  :   Review of Systems:  Out of a complete 14  point review of systems, all are reviewed and negative with the exception of these symptoms as listed below:    Review of Systems  Neurological:        Patient is here alone for referral for continued headache despite a 9 day tapering prednisone treatment. He states the headache started back in November. He doesn't recall the onset details but he states the pain is through the temples. It is a little better but not resolved. He will get busy and not think about it but then it come back and he will take medication for it. He has had headaches in the past but "not like this". Naproxen works best, but he's tried Tylenol and Ibuprofen as well. He does not take Naproxen everyday.    Objective:  Neurological Exam  Physical Exam Physical Examination:   Vitals:   08/19/23 0816  BP: 133/74  Pulse: 62    General Examination: The patient is a very pleasant 72 y.o. male in no acute distress. He appears well-developed and well-nourished and well groomed.   HEENT: Normocephalic, atraumatic, pupils are equal, round and reactive to light, extraocular tracking is good without limitation to gaze excursion or nystagmus noted.  Corrective eyeglasses in place, no photophobia.  Hearing is grossly intact. Face is symmetric with normal facial animation. Speech is clear with no dysarthria noted. There is no hypophonia. There is no lip, neck/head, jaw or voice tremor. Neck is supple with full range of passive and active motion. There are no carotid bruits on auscultation. Oropharynx exam reveals: mild mouth dryness, adequate dental hygiene and moderate airway crowding, due to redundant soft palate, larger uvula.  Tonsils absent.  Mallampati class II.  Tongue protrudes centrally and palate elevates symmetrically.    Chest: Clear to auscultation without wheezing, rhonchi or crackles noted.  Heart: S1+S2+0, regular and normal without murmurs, rubs or gallops noted.   Abdomen: Soft, non-tender and  non-distended.  Extremities: There is very limited range of motion in the left proximal upper extremity.   Skin: Warm and dry without trophic changes noted.   Musculoskeletal: exam reveals no obvious joint deformities.   Neurologically:  Mental status: The patient is awake, alert and oriented in all 4 spheres. His immediate and remote memory, attention, language skills and fund of knowledge are appropriate. There is no evidence of aphasia, agnosia, apraxia or anomia. Speech is clear with normal prosody and enunciation. Thought process is linear. Mood is normal and affect is normal.  Cranial nerves II - XII are as described above under HEENT exam.  Motor exam: Normal bulk, strength and tone is noted with the exception of very limited range of motion in the left proximal upper extremity, not new. There is no obvious action or resting tremor.  Fine motor skills and coordination: grossly intact.  Cerebellar testing: No dysmetria or intention tremor. There is no truncal or gait ataxia.  Normal finger-to-nose on the right side, limited on the left because of decreased range of motion.  Normal heel-to-shin bilaterally. Reflexes are diminished in the upper extremities, trace in the lower extremities.  Toes are downgoing bilaterally. Sensory exam: intact to light touch in the upper and lower extremities.  Gait, station and balance: He stands easily. No veering to one side is noted. No leaning to one side is noted. Posture is age-appropriate and stance is narrow based. Gait shows normal stride length and normal pace. No problems turning are noted.   Assessment and Plan:  In summary, ELIAZER NEARY is a very pleasant 73 y.o.-year old male with an underlying medical history of CHF, aortic root aneurysm, atrial flutter, aortic insufficiency, reflux disease, brachial plexopathy on the left side, sleep apnea (not currently on PAP therapy), hyperlipidemia, squamous cell carcinoma, and overweight state, who who  presents for evaluation of his recurrent headaches of several months duration.  He has recently improved.  He was treated with a prednisone taper.  Headache is likely a mixed form of headache including cervicogenic headache, headache from untreated obstructive sleep apnea, stress related headache, possibly strain on the eyes.  He is typically up-to-date with his eye examination.  He is reminded to stay well-hydrated and well rested and consider treatment for his obstructive sleep apnea.  I had a long discussion about obstructive sleep apnea, its  prognosis and treatment options.  He has never actually tried a positive airway pressure device and is strongly encouraged to circle back to his cardiologist to consider treatment.  He has a history of a brain cavernoma.  He is advised to circle back to his neurosurgeon.  He may need a referral and is encouraged to ask you for a referral to Dr. Franky Macho.  He had recent blood work, I would recommend with his next appointment with you which is scheduled for March 2025, and that you make sure his ESR and CRP are okay.  I think it is a little early to check as he has just had treatment with prednisone.  At this juncture, we mutually agreed to have him follow-up with you and his other providers and consider seeing neurosurgery again.  He is advised to follow-up in this clinic on an as-needed basis.  We talked about his head CT scan results as well.  He asked about trying gabapentin.  I think it is important to address degenerative neck disease and sleep apnea first but ultimately a trial of gabapentin may be considered.  I answered all his questions today and he was in agreement with our approach.  This was an extended visit of over 60 minutes with copious record review involved, addressing multiple issues and explaining different conditions, as well as considerable counseling and coordination of care.   Thank you very much for allowing me to participate in the care of this nice  patient. If I can be of any further assistance to you please do not hesitate to call me at [phone].  Sincerely,   Huston Foley, MD, PhD

## 2023-08-20 DIAGNOSIS — R1313 Dysphagia, pharyngeal phase: Secondary | ICD-10-CM | POA: Diagnosis not present

## 2023-08-20 DIAGNOSIS — R0989 Other specified symptoms and signs involving the circulatory and respiratory systems: Secondary | ICD-10-CM | POA: Diagnosis not present

## 2023-08-20 DIAGNOSIS — R1314 Dysphagia, pharyngoesophageal phase: Secondary | ICD-10-CM | POA: Diagnosis not present

## 2023-08-20 DIAGNOSIS — R1319 Other dysphagia: Secondary | ICD-10-CM | POA: Diagnosis not present

## 2023-08-20 DIAGNOSIS — K219 Gastro-esophageal reflux disease without esophagitis: Secondary | ICD-10-CM | POA: Diagnosis not present

## 2023-08-23 NOTE — Telephone Encounter (Signed)
Dorothea please follow up with this pt regarding CPAP. THANKS

## 2023-08-27 ENCOUNTER — Encounter: Payer: Self-pay | Admitting: Family Medicine

## 2023-08-27 NOTE — Addendum Note (Signed)
Addended by: Theresia Bough on: 08/27/2023 03:05 PM   Modules accepted: Orders

## 2023-08-27 NOTE — Telephone Encounter (Signed)
Pt aware to pick up itamar from HF team  -will place order for Dr Mayford Knife just in case previous referral cancelled

## 2023-08-30 ENCOUNTER — Encounter (INDEPENDENT_AMBULATORY_CARE_PROVIDER_SITE_OTHER): Payer: PPO | Admitting: Cardiology

## 2023-08-30 DIAGNOSIS — G4733 Obstructive sleep apnea (adult) (pediatric): Secondary | ICD-10-CM

## 2023-08-30 NOTE — Telephone Encounter (Signed)
ITAMAR home sleep study given to patient, all instructions explained, waiver signed, and CLOUDPAT registration complete.  S/N [id number]

## 2023-09-08 ENCOUNTER — Telehealth: Payer: Self-pay | Admitting: Cardiology

## 2023-09-08 NOTE — Telephone Encounter (Signed)
 Patient called to follow-up on his sleep study results and wants to get a CPAP machine.  Patient wants a call back to discuss next steps.

## 2023-09-09 ENCOUNTER — Encounter (HOSPITAL_COMMUNITY): Payer: Self-pay | Admitting: Cardiology

## 2023-09-09 ENCOUNTER — Ambulatory Visit: Payer: PPO | Attending: Cardiology

## 2023-09-09 DIAGNOSIS — G4733 Obstructive sleep apnea (adult) (pediatric): Secondary | ICD-10-CM

## 2023-09-10 ENCOUNTER — Encounter (HOSPITAL_COMMUNITY): Payer: Self-pay | Admitting: Cardiology

## 2023-09-10 ENCOUNTER — Ambulatory Visit (HOSPITAL_COMMUNITY)
Admission: RE | Admit: 2023-09-10 | Discharge: 2023-09-10 | Disposition: A | Discharge status: Home / Self Care | Payer: PPO | Source: Ambulatory Visit | Attending: Family Medicine

## 2023-09-10 ENCOUNTER — Inpatient Hospital Stay (HOSPITAL_COMMUNITY)
Admission: RE | Admit: 2023-09-10 | Discharge: 2023-09-10 | Disposition: A | Discharge status: Home / Self Care | Payer: PPO | Source: Ambulatory Visit | Attending: Cardiology | Admitting: Cardiology

## 2023-09-10 ENCOUNTER — Encounter (HOSPITAL_COMMUNITY): Payer: Self-pay

## 2023-09-10 ENCOUNTER — Other Ambulatory Visit (HOSPITAL_COMMUNITY): Payer: Self-pay | Admitting: Cardiology

## 2023-09-10 ENCOUNTER — Telehealth: Payer: Self-pay | Admitting: *Deleted

## 2023-09-10 VITALS — BP 146/72 | HR 69 | Wt 218.6 lb

## 2023-09-10 DIAGNOSIS — R519 Headache, unspecified: Secondary | ICD-10-CM | POA: Diagnosis not present

## 2023-09-10 DIAGNOSIS — E782 Mixed hyperlipidemia: Secondary | ICD-10-CM | POA: Diagnosis not present

## 2023-09-10 DIAGNOSIS — I484 Atypical atrial flutter: Secondary | ICD-10-CM | POA: Diagnosis not present

## 2023-09-10 DIAGNOSIS — I10 Essential (primary) hypertension: Secondary | ICD-10-CM | POA: Diagnosis not present

## 2023-09-10 DIAGNOSIS — Z79899 Other long term (current) drug therapy: Secondary | ICD-10-CM | POA: Diagnosis not present

## 2023-09-10 DIAGNOSIS — G4733 Obstructive sleep apnea (adult) (pediatric): Secondary | ICD-10-CM

## 2023-09-10 DIAGNOSIS — R002 Palpitations: Secondary | ICD-10-CM | POA: Insufficient documentation

## 2023-09-10 DIAGNOSIS — Z8679 Personal history of other diseases of the circulatory system: Secondary | ICD-10-CM | POA: Insufficient documentation

## 2023-09-10 DIAGNOSIS — Z952 Presence of prosthetic heart valve: Secondary | ICD-10-CM | POA: Insufficient documentation

## 2023-09-10 DIAGNOSIS — I5022 Chronic systolic (congestive) heart failure: Secondary | ICD-10-CM | POA: Insufficient documentation

## 2023-09-10 DIAGNOSIS — I7121 Aneurysm of the ascending aorta, without rupture: Secondary | ICD-10-CM | POA: Insufficient documentation

## 2023-09-10 DIAGNOSIS — I440 Atrioventricular block, first degree: Secondary | ICD-10-CM | POA: Insufficient documentation

## 2023-09-10 DIAGNOSIS — Z7901 Long term (current) use of anticoagulants: Secondary | ICD-10-CM | POA: Diagnosis not present

## 2023-09-10 DIAGNOSIS — I4510 Unspecified right bundle-branch block: Secondary | ICD-10-CM | POA: Insufficient documentation

## 2023-09-10 DIAGNOSIS — I4892 Unspecified atrial flutter: Secondary | ICD-10-CM | POA: Diagnosis not present

## 2023-09-10 DIAGNOSIS — E785 Hyperlipidemia, unspecified: Secondary | ICD-10-CM | POA: Insufficient documentation

## 2023-09-10 DIAGNOSIS — I110 Hypertensive heart disease with heart failure: Secondary | ICD-10-CM | POA: Diagnosis not present

## 2023-09-10 DIAGNOSIS — Z954 Presence of other heart-valve replacement: Secondary | ICD-10-CM

## 2023-09-10 DIAGNOSIS — Z7984 Long term (current) use of oral hypoglycemic drugs: Secondary | ICD-10-CM | POA: Diagnosis not present

## 2023-09-10 DIAGNOSIS — E119 Type 2 diabetes mellitus without complications: Secondary | ICD-10-CM | POA: Insufficient documentation

## 2023-09-10 DIAGNOSIS — I712 Thoracic aortic aneurysm, without rupture, unspecified: Secondary | ICD-10-CM | POA: Diagnosis not present

## 2023-09-10 LAB — BASIC METABOLIC PANEL
Anion gap: 10 (ref 5–15)
BUN: 13 mg/dL (ref 8–23)
CO2: 25 mmol/L (ref 22–32)
Calcium: 9.2 mg/dL (ref 8.9–10.3)
Chloride: 102 mmol/L (ref 98–111)
Creatinine, Ser: 0.84 mg/dL (ref 0.61–1.24)
GFR, Estimated: >60 mL/min (ref >60)
Glucose, Bld: 141 mg/dL — ABNORMAL HIGH (ref 70–99)
Potassium: 4.3 mmol/L (ref 3.5–5.1)
Sodium: 137 mmol/L (ref 135–145)

## 2023-09-10 LAB — CBC
HCT: 45.4 % (ref 39.0–52.0)
Hemoglobin: 15.2 g/dL (ref 13.0–17.0)
MCH: 30 pg (ref 26.0–34.0)
MCHC: 33.5 g/dL (ref 30.0–36.0)
MCV: 89.7 fL (ref 80.0–100.0)
Platelets: 256 10*3/uL (ref 150–400)
RBC: 5.06 MIL/uL (ref 4.22–5.81)
RDW: 13.4 % (ref 11.5–15.5)
WBC: 7.6 10*3/uL (ref 4.0–10.5)
nRBC: 0 % (ref 0.0–0.2)

## 2023-09-10 LAB — BRAIN NATRIURETIC PEPTIDE: B Natriuretic Peptide: 106.1 pg/mL — ABNORMAL HIGH (ref 0.0–100.0)

## 2023-09-10 LAB — IRON AND TIBC
Iron: 109 ug/dL (ref 45–182)
Saturation Ratios: 28 % (ref 17.9–39.5)
TIBC: 395 ug/dL (ref 250–450)
UIBC: 286 ug/dL

## 2023-09-10 LAB — MAGNESIUM: Magnesium: 2.1 mg/dL (ref 1.7–2.4)

## 2023-09-10 LAB — TSH: TSH: 1.422 u[IU]/mL (ref 0.350–4.500)

## 2023-09-10 LAB — FERRITIN: Ferritin: 98 ng/mL (ref 24–336)

## 2023-09-10 NOTE — Telephone Encounter (Signed)
 Patient states he just missed a call regarding sleep results. Please advise.

## 2023-09-10 NOTE — Progress Notes (Signed)
 Patient ID: [name] [name], male   DOB: Aug 13, 1950, 73 y.o.   MRN: [id number] PCP: Dr. Mahlon Cardiology: Dr. Rolan  73 y.o. with history of aortic insufficiency s/p Ross procedure and aortic root aneurysm presents for followup of Ross procedure.  He has a left-sided brachial plexopathy of uncertain etiology and has atrophy of his left shoulder and upper arm.  He developed weakness in his right hand as well as occasional vertigo-type spells.  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    Echo in 8/18 showed no regurgitation or stenosis of autograft in aortic position, bioprosthetic pulmonic valve with mild pulmonic stenosis, mean gradient 10 mmHg.  EF was read as 40-45%, which is lower than in the past.  MRA chest 3/19 showed 5.7 cm dilation of pulmonary autograft at level of sinuses of Valsalva, MRA chest in 6/20 showed 6 cm dilation of the pulmonary autograft at the level of the sinuses of valsalva.  He is being followed for aortic root aneurysm by Dr. Army.   MRA chest in 6/21 showed aortic root 5.4 cm, no change from prior. Echo in 8/21 showed EF 50%, RV mildly enlarged with mildly decreased systolic function, pulmonary valve autograft in aortic position with mild regurgitation but no stenosis, bioprosthetic pulmonary valve with no significant stenosis or regurgitation, ascending aorta 5.3 cm.   Patient was noted to be in atrial flutter in 8/21, rate 110s.  Looking back, his HR was elevated in the 110s at physician appointments back to 5/21 (no ECGs).  Underwent TEE-guided DCCV in 8/21.  TEE showed EF 40%, mildly decreased RV function, aortic valve replaced by pulmonary autograft with mild AI and no AS, bioprosthetic PV looked  stable.  No LA appendage thrombus, he underwent DCCV.  After DCCV, he developed exertional dyspnea and was started on Lasix .   Echo in 10/21 showed EF improved to 55-60%, mild RV dilation with mildly decreased systolic function, bioprosthetic PV with peak gradient 31 mmHg (mild PS), pulmonary autograft in aortic position with mild regurgitation and no stenosis.  Sleep study showed severe OSA but he does not want to use CPAP.   MRA chest in 6/22 showed stable 5.4 cm aortic root.  Echo EF 50%, mild LV dilation, mildly decreased RV systolic function, pulmonary autograft in aortic position with mild regurgitation and no stenosis, bioprosthetic pulmonary valve with no significant stenosis or regurgitation, PASP 28 mmHg.   Zio monitor (12/22) showed short SVT and NSVT, no significant arrhythmias.    Patient had robotic repair of esophageal diverticulum.   MRA chest in 7/23 showed 5.4 cm aortic root.    CTA chest in 10/24 showed prior graft repair of ascending aorta with residual 4.5 cm dilation in the ascending aorta proximal to the repair, aneurysm left sinus of Valsalva with aortic root measuring 5.6 cm including the aneurysm.   Echo 10/24 showed EF 50%, mild LV dilation, mildly decreased RV systolic function, s/p Ross procedure with bioprosthetic pulmonary valve peak gradient 15 mmHg, normal aortic valve autograft, 5.4 cm aortic root.   Patient has been found to have achalasia of the cricopharyngeal muscle.   Today he returns for an acute visit with complaints of palpitations. Awoke from sleep last night with chest thumping  also has bad headache. PCP ordered head CT. He was referred to Neurology, who recommended starting CPAP. Sleep study showed mild OSA with AHI 6.6. Has had several dizzy spells, not similar to vertigo that he had years ago. Denies increasing SOB, abnormal bleeding, CP, edema, or PND/Orthopnea. Appetite ok. No fever or chills. Weight at home 218 pounds. Taking all medications.  Worried about heart symptoms and headache.  ECG (personally reviewed): NSR with 1AVB,65 bpm, PR 302 msec  Labs (3/23): K 4.5, creatinine 0.78, LDL 72, TGs 97 Labs (9/23): LDL 49, K 4.3, creatinine 9.14 Labs (9/24): hgb 15.2, TSH normal, K 4.1, creatinine 0.8, LDL 54, TGs 172 Labs (12/24): K 4.6, creatinine 0.83  PMH: 1. Aortic insufficiency: s/p Ross procedure in 1994 in [place].   - Echo (4/11) with hypokinetic basal septum (likely post-surgical), EF 50%, mild LVH, s/p Ross procedure with native pulmonic valve in aortic position with mild aortic insufficiency and bioprosthetic pulmonic valve with no pulmonic insufficiency, mild MR, aortic upper normal in size.  - Echo (4/13) with EF 55%, mild LVH, mild AI, mild MR, mild RV dilation, bioprosthetic pulmonic valve with peak pressure 25 mmHg.   - Echo (3/14): Mild LV dilation, mild LVH, EF 60%, pulmonary valve in aortic position (s/p Ross) with mild AI and mildly dilated ascending aorta to 4.3 cm, mildly dilated RV with normal systolic function, bioprosthetic PVR with mean gradient 23 mmHg.  Echo (3/15) with EF 55-60%, mild LVH, mild AI with no AS, bioprosthetic pulmonic valve with peak gradient 31 mmHg, ascending aorta 4.4 cm.   - Cardiac MRI/MRA chest (5/15) with mild regurgitation of autograft aortic valve and mild regurgitation of homograft pulmonic valve, dilation of aortic root at sinuses of valsalva (4.8 cm), EF 60%, no LGE, mild RV dilation with normal RV systolic function.   - Echo (5/16) with EF 55-60%, mild autograft AI, mild MR, mildly decreased RV systolic function, biatrial enlargement, RVSP 35 mmHg, trivially increased gradient across bioprosthetic pulmonic valve (no progression).  - Echo (8/17): EF 55-60%, pulmonary autograft in aortic valve position, mild AI, bioprosthetic pulmonary valve with mild PS (peak 22 mmHg), moderate RV dilation with normal systolic function.  - Echo (8/18): EF 40-45%, mildly increased gradient across  bioprosthetic pulmonary valve (mean 10 mmHg), s/p Ross procedure with no stenosis or significant regurgitation of pulmonary autograft in aortic position.  - Echo (8/21): EF 50%, RV mildly enlarged with mildly decreased systolic function, pulmonary valve autograft in aortic position with mild regurgitation but no stenosis, bioprosthetic pulmonary valve with no significant stenosis or regurgitation, ascending aorta 5.3 cm.  - TEE (8/21): EF 40%, mildly decreased RV function, aortic valve replaced by pulmonary autograft with mild AI and no AS, bioprosthetic PV looked stable. - Echo (10/21): EF improved to 55-60%, mild RV dilation with mildly decreased systolic function, bioprosthetic PV with peak gradient 31 mmHg (mild PS), pulmonary autograft in aortic position with mild regurgitation and not stenosis.  - Echo (12/22): EF 50%, mild LV dilation, mildly decreased RV systolic function, pulmonary autograft in aortic position with mild regurgitation and no stenosis, bioprosthetic pulmonary valve with no significant stenosis or regurgitation, PASP 28 mmHg. - Echo (10/24): EF 50%, mild LV dilation, mildly decreased RV systolic function, s/p Ross procedure with bioprosthetic pulmonary valve peak gradient 15 mmHg, normal aortic valve autograft, 5.4 cm aortic root.  2. Post-operative atrial fibrillation after heart surgery.  3. Normal left heart cath prior to 1994 heart surgery.  4. Left brachial plexopathy with  left shoulder muscle atrophy.  Uncertain etiology.  5. BPH 6. Hyperlipidemia.  7. Cervical spinal stenosis with radiculopathy.   8. 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  9. ETT-Sestamibi (3/14): No ischemia or infarction.  10. Carotid dopplers (6/14): mild disease only.  11. Sinus of valsalva aneurysm: MRA chest 2/18 with dilation of pulmonary autograft at level of sinuses of Valsalva to 5.4 cm when measured into left cusp.  - MRA  chest (3/19): Saccular dilatation of the left and right sinuses of valsalva, 5.7 cm aortic root diameter.   - MRA chest (6/20): 6 cm dilated sinuses of valsalva, 4.6 cm ascending aorta.  - MRA chest (6/21): aortic root 5.4 cm, no change from prior - MRA chest (6/22): aortic root 5.4 cm.  - MRA chest (7/23): aortic root 5.4 cm - CTA chest (10/24): prior graft repair of ascending aorta with residual 4.5 cm dilation in the ascending aorta proximal to the repair, aneurysm left sinus of Valsalva with aortic root measuring 5.6 cm including the aneurysm.  12. Elevated left hemidiaphragm.  13. Atrial flutter: Atypical.  Noted in 8/21 => underwent DCCV to NSR.  14. Presyncope: Zio monitor 12/22 with short SVT and NSVT runs, no significant arrhythmias. 15. Esophageal diverticulum: s/p repair 16. Achalasia of the cricopharyngeal muscle  FH: Prostate CA, HTN  SH: Lives in [place], owns a window washing business, married, nonsmoker.   ROS: All systems reviewed and negative except as per HPI.   Current Outpatient Medications  Medication Sig Dispense Refill   apixaban  (ELIQUIS ) 5 MG TABS tablet Take 1 tablet (5 mg total) by mouth 2 (two) times daily. 180 tablet 3   azelastine  (ASTELIN ) 0.1 % nasal spray Place 2 sprays into both nostrils 2 (two) times daily. 30 mL 12   cetirizine  (ZYRTEC ) 10 MG tablet TAKE 1 TABLET(10 MG) BY MOUTH DAILY 90 tablet 0   empagliflozin  (JARDIANCE ) 10 MG TABS tablet Take 1 tablet (10 mg total) by mouth daily before breakfast. 90 tablet 3   famotidine (PEPCID) 40 MG tablet Take 40 mg by mouth as needed for heartburn or indigestion.     finasteride  (PROSCAR ) 5 MG tablet TAKE 1 TABLET(5 MG) BY MOUTH DAILY 90 tablet 1   fluticasone  (FLONASE ) 50 MCG/ACT nasal spray Place 2 sprays into both nostrils as needed for allergies or rhinitis. 50 mL 30   furosemide  (LASIX ) 20 MG tablet TAKE 1 TABLET(20 MG) BY MOUTH EVERY OTHER DAY 15 tablet 11   ipratropium (ATROVENT ) 0.06 % nasal spray  USE 2 SPRAYS IN EACH NOSTRIL FOUR TIMES DAILY (Patient taking differently: Daily in the morning) 15 mL 12   losartan  (COZAAR ) 25 MG tablet Take 1 tablet (25 mg total) by mouth daily. 90 tablet 3   metoprolol  succinate (TOPROL -XL) 100 MG 24 hr tablet Take 1 tablet (100 mg total) by mouth every morning AND 0.5 tablets (50 mg total) every evening. Take with or immediately following a meal.. 180 tablet 3   montelukast  (SINGULAIR ) 10 MG tablet TAKE 1 TABLET(10 MG) BY MOUTH AT BEDTIME 30 tablet 6   pantoprazole  (PROTONIX ) 40 MG tablet Take 1 tablet (40 mg total) by mouth daily. 90 tablet 1   rosuvastatin  (CRESTOR ) 40 MG tablet TAKE 1 TABLET(40 MG) BY MOUTH DAILY 90 tablet 3   spironolactone  (ALDACTONE ) 25 MG tablet TAKE 1 TABLET(25 MG) BY MOUTH DAILY 90 tablet 3   No current facility-administered medications for this encounter.  BP (!) 146/72   Pulse 69   Wt 99.2 kg (218 lb 9.6 oz)   SpO2 98%   BMI 27.32 kg/m  Physical Exam General:  NAD. No resp difficulty, walked into clinic HEENT: Normal Neck: Supple. No JVD. Cor: Regular rate & rhythm. No rubs, gallops or murmurs. Widely split S2 Lungs: Clear Abdomen: Soft, nontender, nondistended.  Extremities: No cyanosis, clubbing, rash, edema Neuro: Alert & oriented x 3, moves all 4 extremities w/o difficulty. Affect pleasant.  Assessment/Plan: 1. Atrial flutter:  Likely related to prior cardiac surgery.  He had peri-operative AF at time of heart surgery but had not been noted to have recurrent arrhythmias until 8/21.  In 8/21, he was noted to be in atrial flutter with RVR, based on review of HR over the past few months, he had probably been in atrial flutter since at least 5/21. He had DCCV in 8/21. He saw EP, ablation offered if flutter recurs. NSR on ECG today. Of note, recent sleep study showed a detection of atrial fibrillation lasting 1 minute and 25 seconds. - Will place 2 week Zio to quantify arrhythmia. - Continue Eliquis  5 mg bid.   -  Continue Toprol  100/50. 1st degree AVB, PR better today at 302 msec on today's ECG - Check CBC, iron panel, BMET and TSH. - Discussed CPAP today (see below) 2.  History of Ross Procedure: Echo10/24 showed pulmonary autograft in aortic position with trivial AI, no AS.  The bioprosthetic pulmonary valve appeared normal.   - Needs endocarditis prophylaxis with dental work.   3.  Aortic root aneurym: This not uncommonly accompanies Ross procedure.  CTA chest in 10/24 showed a left sinus of Valsalva aneurysm with the aortic root measuring 5.6 cm including the aneurysm; there was a prior graft repair of the ascending aorta with residual 4.5 cm ascneding aorta dilation proximal to the graft repair.  - Following with TCTS. Aortic root is significantly dilated but has been stable. May be nearing need for elective repair.  - Goal SBP 120s or lower.  4. HTN: Given aortic root/ascending aorta aneurysm, need to make sure that BP is well-controlled.  - On lower dose of Toprol  XL with profound 1st degree AVB.  - Continue losartan  25 mg daily. - Continue spironolactone  25 mg daily.    5. Hyperlipidemia: Continue current Crestor , good LDL in 9/24.  6. Chronic systolic CHF: TEE in 8/21 in setting of long-standing atrial flutter with RVR showed EF down to 40%. 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  He was cardioverted in 8/21 but developed exertional dyspnea with volume overload afterwards.  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  Echo in 12/22 showed EF 50% with mild RV dysfunction. Echo 10/24 showed EF 50%, mild RV dysfunction.  He is not volume overloaded on exam, NYHA class I-II symptoms.  - Continue Lasix  20 mg qod.  - Continue Toprol  XL at lower dose as above.  - Continue losartan  25 mg daily.   - Continue spironolactone  25 mg daily. - Continue Jardiance  10 mg daily.  7. OSA: Sleep study 2/22 showed severe OSA with AHI of 35/hr. Most recent study 08/30/23 showed mild by  recent sleep study, AHI 6.6, oxygen desaturation as low as 81%. He is being follow by Dr. Shlomo, has not received CPAP yet. - We discussed palpitations and HA may be related to untreated OSA, however mild. 8. Diabetes: A1C 8.1. On Jardiance . - Management per PCP.  Followup with Dr.  Rolan, as scheduled.  Harlene HERO [facility] FNP-BC 09/10/2023

## 2023-09-10 NOTE — Telephone Encounter (Signed)
 The patient has been notified of the result and verbalized understanding.  All questions (if any) were answered. Joshua Dalton Seip, CMA 09/10/2023 4:01 PM    Please order an auto CPAP from 4-15cm H2O with heated humidity and mask of choice. Order overnight pulse ox on CPAP. Followup with me in 6 weeks.   Upon patient request DME selection is [facility] Patient understands he will be contacted by [facility] to set up his cpap. Patient understands to call if [facility] does not contact him with new setup in a timely manner. Patient understands they will be called once confirmation has been received from [facility] that they have received their new machine to schedule 10 week follow up appointment.   [facility] notified of new cpap order  Please add to airview Patient was grateful for the call and thanked me.

## 2023-09-10 NOTE — Telephone Encounter (Signed)
-----   Message from Wilbert Bihari sent at 09/09/2023  7:08 PM EST ----- Please let patient know that they have sleep apnea and recommend treating with CPAP.  Please order an auto CPAP from 4-15cm H2O with heated humidity and mask of choice.  Order overnight pulse ox on CPAP.  Followup with me in 6 weeks.

## 2023-09-10 NOTE — Patient Instructions (Addendum)
 Thank you for coming in today  If you had labs drawn today, any labs that are abnormal the clinic will call you No news is good news  Your provider has recommended that  you wear a Zio Patch for 14 days.  This monitor will record your heart rhythm for our review.  IF you have any symptoms while wearing the monitor please press the button.  If you have any issues with the patch or you notice a red or orange light on it please call the company at [phone].  Once you remove the patch please mail it back to the company as soon as possible so we can get the results.   Medications: No changes   Follow up appointments:  Your physician recommends that you schedule a follow-up appointment in:  Keep scheduled appointment with Dr. Rolan   Do the following things EVERYDAY: Weigh yourself in the morning before breakfast. Write it down and keep it in a log. Take your medicines as prescribed Eat low salt foods--Limit salt (sodium) to 2000 mg per day.  Stay as active as you can everyday Limit all fluids for the day to less than 2 liters   At the [facility], you and your health needs are our priority. As part of our continuing mission to provide you with exceptional heart care, we have created designated Provider Care Teams. These Care Teams include your primary Cardiologist (physician) and Advanced Practice Providers (APPs- Physician Assistants and Nurse Practitioners) who all work together to provide you with the care you need, when you need it.   You may see any of the following providers on your designated Care Team at your next follow up: Dr Toribio Fuel Dr Ezra Rolan Dr. Ria Gardenia Greig Lenetta, NP Caffie Shed, [place] [facility] [place], [place] Beckey Coe, NP Tinnie Redman, PharmD   Please be sure to bring in all your medications bottles to every appointment.    Thank you for choosing [facility]  If  you have any questions or concerns before your next appointment please send us  a message through [place] or call our office at [phone].    TO LEAVE A MESSAGE FOR THE NURSE SELECT OPTION 2, PLEASE LEAVE A MESSAGE INCLUDING: YOUR NAME DATE OF BIRTH CALL BACK NUMBER REASON FOR CALL**this is important as we prioritize the call backs  YOU WILL RECEIVE A CALL BACK THE SAME DAY AS LONG AS YOU CALL BEFORE 4:00 PM

## 2023-09-13 NOTE — Telephone Encounter (Signed)
 Recommend Tylenol  (acetaminophen ) 325 mg to 650 mg every 4 to 6 hours as needed, maximum daily dose 4000 mg (4 grams) per day. Would avoid NSAIDS like ibuprofen or naproxen given history of heart failure and taking Eliquis , as this could increase his risk of bleeding.

## 2023-09-13 NOTE — Telephone Encounter (Signed)
 Will forward for input from care team,if additional medication advise is needed besides HF OTC meds

## 2023-09-16 ENCOUNTER — Other Ambulatory Visit: Payer: Self-pay

## 2023-09-16 DIAGNOSIS — J302 Other seasonal allergic rhinitis: Secondary | ICD-10-CM

## 2023-09-16 MED ORDER — FLUTICASONE PROPIONATE 50 MCG/ACT NA SUSP: 2.0000 | NASAL | 30 refills | Status: AC | PRN

## 2023-09-16 NOTE — Telephone Encounter (Signed)
Reached out to patient and he has an appointment to pickup his cpap on 09/17/23.

## 2023-09-17 DIAGNOSIS — G4733 Obstructive sleep apnea (adult) (pediatric): Secondary | ICD-10-CM | POA: Diagnosis not present

## 2023-09-23 ENCOUNTER — Telehealth: Payer: Self-pay | Admitting: Family Medicine

## 2023-09-23 NOTE — Telephone Encounter (Signed)
Copied from CRM [id number]. Topic: General - Other >> Sep 22, 2023  2:52 PM [place] E wrote: Reason for CRM: Patient called in requesting the email for Dr. Rennis [facility] clinic. He is currently filling out his will and they are needing either the provider's email or the clinic's email (either will do).

## 2023-09-24 ENCOUNTER — Telehealth (HOSPITAL_COMMUNITY): Payer: Self-pay | Admitting: Cardiology

## 2023-09-24 NOTE — Telephone Encounter (Signed)
Called patient at [phone] to remind patient of his appt with Dr. Shirlee Latch on Monday 09/27/23 at 9:00 AM.   Front office unable to speak to patient directly so front office staff left a voice message for patient on his telephone to remind him of his appt on Monday 09/27/23.

## 2023-09-24 NOTE — Telephone Encounter (Signed)
Sent the patient thru MyChart our office email.

## 2023-09-27 ENCOUNTER — Ambulatory Visit (HOSPITAL_COMMUNITY)
Admission: RE | Admit: 2023-09-27 | Discharge: 2023-09-27 | Disposition: A | Discharge status: Home / Self Care | Payer: PPO | Source: Ambulatory Visit | Attending: Cardiology | Admitting: Cardiology

## 2023-09-27 VITALS — BP 104/60 | HR 60 | Wt 220.4 lb

## 2023-09-27 DIAGNOSIS — Z7901 Long term (current) use of anticoagulants: Secondary | ICD-10-CM | POA: Diagnosis not present

## 2023-09-27 DIAGNOSIS — K220 Achalasia of cardia: Secondary | ICD-10-CM | POA: Diagnosis not present

## 2023-09-27 DIAGNOSIS — I7121 Aneurysm of the ascending aorta, without rupture: Secondary | ICD-10-CM | POA: Insufficient documentation

## 2023-09-27 DIAGNOSIS — Z79899 Other long term (current) drug therapy: Secondary | ICD-10-CM | POA: Diagnosis not present

## 2023-09-27 DIAGNOSIS — R0609 Other forms of dyspnea: Secondary | ICD-10-CM | POA: Insufficient documentation

## 2023-09-27 DIAGNOSIS — G4733 Obstructive sleep apnea (adult) (pediatric): Secondary | ICD-10-CM | POA: Insufficient documentation

## 2023-09-27 DIAGNOSIS — Z7984 Long term (current) use of oral hypoglycemic drugs: Secondary | ICD-10-CM | POA: Diagnosis not present

## 2023-09-27 DIAGNOSIS — I5022 Chronic systolic (congestive) heart failure: Secondary | ICD-10-CM | POA: Insufficient documentation

## 2023-09-27 DIAGNOSIS — I351 Nonrheumatic aortic (valve) insufficiency: Secondary | ICD-10-CM | POA: Diagnosis not present

## 2023-09-27 DIAGNOSIS — R002 Palpitations: Secondary | ICD-10-CM | POA: Diagnosis not present

## 2023-09-27 DIAGNOSIS — I371 Nonrheumatic pulmonary valve insufficiency: Secondary | ICD-10-CM | POA: Diagnosis not present

## 2023-09-27 DIAGNOSIS — E119 Type 2 diabetes mellitus without complications: Secondary | ICD-10-CM | POA: Insufficient documentation

## 2023-09-27 DIAGNOSIS — G540 Brachial plexus disorders: Secondary | ICD-10-CM | POA: Insufficient documentation

## 2023-09-27 DIAGNOSIS — Z952 Presence of prosthetic heart valve: Secondary | ICD-10-CM | POA: Diagnosis not present

## 2023-09-27 DIAGNOSIS — I110 Hypertensive heart disease with heart failure: Secondary | ICD-10-CM | POA: Diagnosis not present

## 2023-09-27 DIAGNOSIS — I4892 Unspecified atrial flutter: Secondary | ICD-10-CM | POA: Insufficient documentation

## 2023-09-27 DIAGNOSIS — E785 Hyperlipidemia, unspecified: Secondary | ICD-10-CM | POA: Insufficient documentation

## 2023-09-27 NOTE — Patient Instructions (Signed)
 There has been no changes to your medications.  Your physician recommends that you schedule a follow-up appointment in: 6 months.  If you have any questions or concerns before your next appointment please send [place] a message through [place] or call our office at [phone].    TO LEAVE A MESSAGE FOR THE NURSE SELECT OPTION 2, PLEASE LEAVE A MESSAGE INCLUDING: YOUR NAME DATE OF BIRTH CALL BACK NUMBER REASON FOR CALL**this is important as we prioritize the call backs  YOU WILL RECEIVE A CALL BACK THE SAME DAY AS LONG AS YOU CALL BEFORE 4:00 PM  At the [facility], you and your health needs are our priority. As part of our continuing mission to provide you with exceptional heart care, we have created designated Provider Care Teams. These Care Teams include your primary Cardiologist (physician) and Advanced Practice Providers (APPs- Physician Assistants and Nurse Practitioners) who all work together to provide you with the care you need, when you need it.   You may see any of the following providers on your designated Care Team at your next follow up: Dr Arvilla Meres Dr Marca Ancona Dr. Dorthula Nettles Dr. Clearnce Hasten Amy Filbert Schilder, NP Robbie Lis, [place] [facility] [place], [place] Brynda Peon, NP [place] Lee, NP Clarisa Kindred, NP Karle Plumber, PharmD Enos Fling, PharmD   Please be sure to bring in all your medications bottles to every appointment.    Thank you for choosing [facility]

## 2023-09-27 NOTE — Addendum Note (Signed)
 Encounter addended by: Laurey Morale, MD on: 09/27/2023 10:47 PM  Actions taken: Clinical Note Signed

## 2023-09-27 NOTE — Progress Notes (Addendum)
 Patient ID: [name] [name], male   DOB: 1951/06/19, 73 y.o.   MRN: [id number] PCP: Dr. Beverely Low Cardiology: Dr. Shirlee Latch  Chief complaint: CHF/valvular heart disease  73 y.o. with history of aortic insufficiency s/p Ross procedure and aortic root aneurysm presents for followup of Ross procedure.  He has a left-sided brachial plexopathy of uncertain etiology and has atrophy of his left shoulder and upper arm.  He developed weakness in his right hand as well as occasional vertigo-type spells.  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    Echo in 8/18 showed no regurgitation or stenosis of autograft in aortic position, bioprosthetic pulmonic valve with mild pulmonic stenosis, mean gradient 10 mmHg.  EF was read as 40-45%, which is lower than in the past.  MRA chest 3/19 showed 5.7 cm dilation of pulmonary autograft at level of sinuses of Valsalva, MRA chest in 6/20 showed 6 cm dilation of the pulmonary autograft at the level of the sinuses of valsalva.  He is being followed for aortic root aneurysm by Dr. Tyrone Sage.   MRA chest in 6/21 showed aortic root 5.4 cm, no change from prior. Echo in 8/21 showed EF 50%, RV mildly enlarged with mildly decreased systolic function, pulmonary valve autograft in aortic position with mild regurgitation but no stenosis, bioprosthetic pulmonary valve with no significant stenosis or regurgitation, ascending aorta 5.3 cm.   Patient was noted to be in atrial flutter in 8/21, rate 110s.  Looking back, his HR was elevated in the 110s at physician appointments back to 5/21 (no ECGs).  Underwent TEE-guided DCCV in 8/21.  TEE showed EF 40%, mildly decreased RV function, aortic valve replaced by pulmonary autograft with  mild AI and no AS, bioprosthetic PV looked stable.  No LA appendage thrombus, he underwent DCCV.  After DCCV, he developed exertional dyspnea and was started on Lasix.   Echo in 10/21 showed EF improved to 55-60%, mild RV dilation with mildly decreased systolic function, bioprosthetic PV with peak gradient 31 mmHg (mild PS), pulmonary autograft in aortic position with mild regurgitation and no stenosis.  Sleep study showed severe OSA but he does not want to use CPAP.   MRA chest in 6/22 showed stable 5.4 cm aortic root.  Echo EF 50%, mild LV dilation, mildly decreased RV systolic function, pulmonary autograft in aortic position with mild regurgitation and no stenosis, bioprosthetic pulmonary valve with no significant stenosis or regurgitation, PASP 28 mmHg.   Zio monitor (12/22) showed short SVT and NSVT, no significant arrhythmias.    Patient had robotic repair of esophageal diverticulum.   MRA chest in 7/23 showed 5.4 cm aortic root.    CTA chest in 10/24 showed prior graft repair of ascending aorta with residual 4.5 cm dilation in the ascending aorta proximal to the repair, aneurysm left sinus of Valsalva with aortic root measuring 5.6 cm including the aneurysm.   Echo 10/24 showed EF 50%, mild LV dilation, mildly decreased RV systolic function, s/p Ross procedure with bioprosthetic pulmonary valve peak gradient 15 mmHg, normal aortic valve autograft, 5.4 cm aortic root.   Patient has been found to have achalasia of the cricopharyngeal muscle.   Patient returns for followup of CHF and valvular heart disease.  At last  appointment, he noted palpitations and lightheadedness.  He wore a Zio monitor x 2 wks and just turned it in, no results yet.  However, he has felt quite good for the last few weeks. No further palpitations.  He is staying active and working 5-6 hours/day.  No chest pain.  No exertional dyspnea.  BP has been well-controlled. Weight up 2 lbs.  He is taking Lasix every other day.  He  is using CPAP.   ECG (personally reviewed): NSR with 1st degree AVB, PR 310 msec  Labs (3/23): K 4.5, creatinine 0.78, LDL 72, TGs 97 Labs (9/23): LDL 49, K 4.3, creatinine 4.09 Labs (9/24): hgb 15.2, TSH normal, K 4.1, creatinine 0.8, LDL 54, TGs 172 Labs (12/24): K 4.6, creatinine 0.83 Labs (2/25): K 4.3, creaitnine 0.84, TSH normal, BNP 106  PMH: 1. Aortic insufficiency: s/p Ross procedure in 1994 in [place].   - Echo (4/11) with hypokinetic basal septum (likely post-surgical), EF 50%, mild LVH, s/p Ross procedure with native pulmonic valve in aortic position with mild aortic insufficiency and bioprosthetic pulmonic valve with no pulmonic insufficiency, mild MR, aortic upper normal in size.  - Echo (4/13) with EF 55%, mild LVH, mild AI, mild MR, mild RV dilation, bioprosthetic pulmonic valve with peak pressure 25 mmHg.   - Echo (3/14): Mild LV dilation, mild LVH, EF 60%, pulmonary valve in aortic position (s/p Ross) with mild AI and mildly dilated ascending aorta to 4.3 cm, mildly dilated RV with normal systolic function, bioprosthetic PVR with mean gradient 23 mmHg.  Echo (3/15) with EF 55-60%, mild LVH, mild AI with no AS, bioprosthetic pulmonic valve with peak gradient 31 mmHg, ascending aorta 4.4 cm.   - Cardiac MRI/MRA chest (5/15) with mild regurgitation of autograft aortic valve and mild regurgitation of homograft pulmonic valve, dilation of aortic root at sinuses of valsalva (4.8 cm), EF 60%, no LGE, mild RV dilation with normal RV systolic function.   - Echo (5/16) with EF 55-60%, mild autograft AI, mild MR, mildly decreased RV systolic function, biatrial enlargement, RVSP 35 mmHg, trivially increased gradient across bioprosthetic pulmonic valve (no progression).  - Echo (8/17): EF 55-60%, pulmonary autograft in aortic valve position, mild AI, bioprosthetic pulmonary valve with mild PS (peak 22 mmHg), moderate RV dilation with normal systolic function.  - Echo (8/18): EF 40-45%,  mildly increased gradient across bioprosthetic pulmonary valve (mean 10 mmHg), s/p Ross procedure with no stenosis or significant regurgitation of pulmonary autograft in aortic position.  - Echo (8/21): EF 50%, RV mildly enlarged with mildly decreased systolic function, pulmonary valve autograft in aortic position with mild regurgitation but no stenosis, bioprosthetic pulmonary valve with no significant stenosis or regurgitation, ascending aorta 5.3 cm.  - TEE (8/21): EF 40%, mildly decreased RV function, aortic valve replaced by pulmonary autograft with mild AI and no AS, bioprosthetic PV looked stable. - Echo (10/21): EF improved to 55-60%, mild RV dilation with mildly decreased systolic function, bioprosthetic PV with peak gradient 31 mmHg (mild PS), pulmonary autograft in aortic position with mild regurgitation and not stenosis.  - Echo (12/22): EF 50%, mild LV dilation, mildly decreased RV systolic function, pulmonary autograft in aortic position with mild regurgitation and no stenosis, bioprosthetic pulmonary valve with no significant stenosis or regurgitation, PASP 28 mmHg. - Echo (10/24): EF 50%, mild LV dilation, mildly decreased RV systolic function, s/p Ross procedure with bioprosthetic pulmonary valve peak gradient 15 mmHg, normal aortic valve autograft, 5.4 cm aortic root.  2. Post-operative atrial  fibrillation after heart surgery.  3. Normal left heart cath prior to 1994 heart surgery.  4. Left brachial plexopathy with left shoulder muscle atrophy.  Uncertain etiology.  5. BPH 6. Hyperlipidemia.  7. Cervical spinal stenosis with radiculopathy.   8. 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  9. ETT-Sestamibi (3/14): No ischemia or infarction.  10. Carotid dopplers (6/14): mild disease only.  11. Sinus of valsalva aneurysm: MRA chest 2/18 with dilation of pulmonary autograft at level of sinuses of Valsalva to 5.4 cm when  measured into left cusp.  - MRA chest (3/19): Saccular dilatation of the left and right sinuses of valsalva, 5.7 cm aortic root diameter.   - MRA chest (6/20): 6 cm dilated sinuses of valsalva, 4.6 cm ascending aorta.  - MRA chest (6/21): aortic root 5.4 cm, no change from prior - MRA chest (6/22): aortic root 5.4 cm.  - MRA chest (7/23): aortic root 5.4 cm - CTA chest (10/24): prior graft repair of ascending aorta with residual 4.5 cm dilation in the ascending aorta proximal to the repair, aneurysm left sinus of Valsalva with aortic root measuring 5.6 cm including the aneurysm.  12. Elevated left hemidiaphragm.  13. Atrial flutter: Atypical.  Noted in 8/21 => underwent DCCV to NSR.  14. Presyncope: Zio monitor 12/22 with short SVT and NSVT runs, no significant arrhythmias. 15. Esophageal diverticulum: s/p repair 16. Achalasia of the cricopharyngeal muscle  FH: Prostate CA, HTN  SH: Lives in [place], owns a window washing business, married, nonsmoker.   ROS: All systems reviewed and negative except as per HPI.   Current Outpatient Medications  Medication Sig Dispense Refill   apixaban (ELIQUIS) 5 MG TABS tablet Take 1 tablet (5 mg total) by mouth 2 (two) times daily. 180 tablet 3   azelastine (ASTELIN) 0.1 % nasal spray Place 1 spray into both nostrils as needed for rhinitis. Use in each nostril as directed     cetirizine (ZYRTEC) 10 MG tablet TAKE 1 TABLET(10 MG) BY MOUTH DAILY 90 tablet 0   empagliflozin (JARDIANCE) 10 MG TABS tablet Take 1 tablet (10 mg total) by mouth daily before breakfast. 90 tablet 3   famotidine (PEPCID) 40 MG tablet Take 40 mg by mouth as needed for heartburn or indigestion.     finasteride (PROSCAR) 5 MG tablet TAKE 1 TABLET(5 MG) BY MOUTH DAILY 90 tablet 1   fluticasone (FLONASE) 50 MCG/ACT nasal spray Place 2 sprays into both nostrils as needed for allergies or rhinitis. 50 mL 30   furosemide (LASIX) 20 MG tablet TAKE 1 TABLET(20 MG) BY MOUTH EVERY OTHER DAY  15 tablet 11   ipratropium (ATROVENT) 0.06 % nasal spray Place 2 sprays into both nostrils daily.     losartan (COZAAR) 25 MG tablet Take 1 tablet (25 mg total) by mouth daily. 90 tablet 3   metoprolol succinate (TOPROL-XL) 100 MG 24 hr tablet Take 1 tablet (100 mg total) by mouth every morning AND 0.5 tablets (50 mg total) every evening. Take with or immediately following a meal.. 180 tablet 3   montelukast (SINGULAIR) 10 MG tablet TAKE 1 TABLET(10 MG) BY MOUTH AT BEDTIME 30 tablet 6   pantoprazole (PROTONIX) 40 MG tablet Take 1 tablet (40 mg total) by mouth daily. 90 tablet 1   rosuvastatin (CRESTOR) 40 MG tablet TAKE 1 TABLET(40 MG) BY MOUTH DAILY 90 tablet 3   spironolactone (ALDACTONE) 25 MG tablet TAKE 1 TABLET(25  MG) BY MOUTH DAILY 90 tablet 3   No current facility-administered medications for this encounter.   BP 104/60   Pulse 60   Wt 100 kg (220 lb 6.4 oz)   SpO2 98%   BMI 27.55 kg/m  General: NAD Neck: No JVD, no thyromegaly or thyroid nodule.  Lungs: Clear to auscultation bilaterally with normal respiratory effort. CV: Nondisplaced PMI.  Heart regular S1/S2, no S3/S4, 2/6 SEM RUSB with widely split S2, no edema, no JVD.  No peripheral edema.  No carotid bruit.  Normal pedal pulses.  Abdomen: Soft, nontender, no hepatosplenomegaly, no distention.  Skin: Intact without lesions or rashes.  Neurologic: Alert and oriented x 3.  Psych: Normal affect. Extremities: No clubbing or cyanosis.  HEENT: Normal.   Assessment/Plan: 1. Atrial flutter:  Likely related to prior cardiac surgery.  He had peri-operative AF at time of heart surgery but had not been noted to have recurrent arrhythmias until 8/21.  In 8/21, he was noted to be in atrial flutter with RVR, based on review of HR over the past few months, he had probably been in atrial flutter since at least 5/21. He had DCCV in 8/21. He saw EP, ablation offered if flutter recurs. NSR on ECG today.  Recent palpitations, he just completed  a Zio monitor with no results back yet. - Continue Eliquis 5 mg bid.   - Continue Toprol 100/50. Stable 1st degree AVB on ECG.  2.  History of Ross Procedure: Echo10/24 showed pulmonary autograft in aortic position with trivial AI, no AS.  The bioprosthetic pulmonary valve appeared normal.   - Needs endocarditis prophylaxis with dental work.   3.  Aortic root aneurym: This not uncommonly accompanies Ross procedure.  CTA chest in 10/24 showed a left sinus of Valsalva aneurysm with the aortic root measuring 5.6 cm including the aneurysm; there was a prior graft repair of the ascending aorta with residual 4.5 cm ascneding aorta dilation proximal to the graft repair.  - Following with TCTS. Aortic root is significantly dilated but has been stable. May be nearing need for elective repair.  He will have repeat CTA chest in 4/25.  - Goal SBP 120s or lower, currently controlled.   4. HTN: Given aortic root/ascending aorta aneurysm, need to make sure that BP is well-controlled. BP excellent today.  - Continue current Toprol XL.  - Continue losartan 25 mg daily. - Continue spironolactone 25 mg daily.    5. Hyperlipidemia: Continue current Crestor, good LDL in 9/24.  6. Chronic systolic CHF: TEE in 8/21 in setting of long-standing atrial flutter with RVR showed EF down to 40%. 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  He was cardioverted in 8/21 but developed exertional dyspnea with volume overload afterwards.  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  Echo in 12/22 showed EF 50% with mild RV dysfunction. Echo 10/24 showed EF 50%, mild RV dysfunction.  He is not volume overloaded on exam, NYHA class I-II.   - Continue Lasix 20 mg qod. Recent BMET stable.  - Continue Toprol XL.  - Continue losartan 25 mg daily.   - Continue spironolactone 25 mg daily. - Continue Jardiance 10 mg daily.  7. OSA: Continue CPAP.  8. Diabetes: Continue Jardiance.   Followup in 6 months with  APP.  I spent 32 minutes reviewing data, interviewing patient, and organizing the orders/followup.   Marca Ancona  09/27/2023

## 2023-09-29 DIAGNOSIS — R002 Palpitations: Secondary | ICD-10-CM | POA: Diagnosis not present

## 2023-09-30 DIAGNOSIS — G4733 Obstructive sleep apnea (adult) (pediatric): Secondary | ICD-10-CM | POA: Diagnosis not present

## 2023-10-04 NOTE — Addendum Note (Signed)
 Encounter addended by: Crissie Figures, RN on: 10/04/2023 9:16 AM  Actions taken: Imaging Exam ended

## 2023-10-08 ENCOUNTER — Other Ambulatory Visit: Payer: Self-pay | Admitting: Thoracic Surgery (Cardiothoracic Vascular Surgery)

## 2023-10-08 DIAGNOSIS — I7121 Aneurysm of the ascending aorta, without rupture: Secondary | ICD-10-CM

## 2023-10-11 ENCOUNTER — Telehealth: Payer: Self-pay | Admitting: *Deleted

## 2023-10-11 ENCOUNTER — Telehealth (HOSPITAL_COMMUNITY): Payer: Self-pay | Admitting: *Deleted

## 2023-10-11 DIAGNOSIS — I5022 Chronic systolic (congestive) heart failure: Secondary | ICD-10-CM

## 2023-10-11 MED ORDER — METOPROLOL SUCCINATE ER 100 MG PO TB24: 100.0000 mg | ORAL_TABLET | Freq: Two times a day (BID) | ORAL | 3 refills | Status: AC

## 2023-10-11 NOTE — Telephone Encounter (Signed)
 Overnight pulse oximetry report results are No Nocturnal hypoxemia on Cpap (No night time low oxygen).

## 2023-10-11 NOTE — Telephone Encounter (Signed)
 Called patient per Dr. Shirlee Latch with following heart monitor results and instructions:  "He had short runs of SVT which may be the cause of his palpitations.  Can try increasing beta blocker to Toprol XL 100 mg bid to suppress."  Pt verbalized understanding of same and would like to increase his dose. Updated Rx sent to local pharmacy. Pt has f/u with general cardiology next month.

## 2023-10-13 ENCOUNTER — Ambulatory Visit: Payer: Self-pay

## 2023-10-15 DIAGNOSIS — G4733 Obstructive sleep apnea (adult) (pediatric): Secondary | ICD-10-CM | POA: Diagnosis not present

## 2023-10-19 ENCOUNTER — Encounter (INDEPENDENT_AMBULATORY_CARE_PROVIDER_SITE_OTHER): Payer: Self-pay | Admitting: Family Medicine

## 2023-10-19 ENCOUNTER — Encounter: Payer: Self-pay | Admitting: Neurology

## 2023-10-19 DIAGNOSIS — M542 Cervicalgia: Secondary | ICD-10-CM

## 2023-10-19 DIAGNOSIS — R519 Headache, unspecified: Secondary | ICD-10-CM

## 2023-10-19 DIAGNOSIS — M5090 Cervical disc disorder, unspecified, unspecified cervical region: Secondary | ICD-10-CM

## 2023-10-20 DIAGNOSIS — R519 Headache, unspecified: Secondary | ICD-10-CM

## 2023-10-20 NOTE — Telephone Encounter (Signed)
 Patient requesting referral to neurosurgery due to continued headaches and no improvement after starting with CPAP, patient was seen in December for this concern   Please advise does he need a new appointment?

## 2023-10-20 NOTE — Telephone Encounter (Signed)
 [facility] VISIT   Patient agreed to [facility] visit and is aware that copayment and coinsurance may apply. Patient was treated using telemedicine according to accepted telemedicine protocols.  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   Aortic atherosclerosis (HCC) 01/19/2022   Other allergic rhinitis 12/31/2021   Degenerative disc disease, lumbar 04/04/2021   Abdominal bloating 12/14/2019   Controlled diabetes mellitus type 2 with complications (HCC) 12/14/2019   Headache syndrome 10/18/2018   Laryngopharyngeal reflux (LPR) 08/10/2016   BPH (benign prostatic hyperplasia) 06/03/2015   H/O Ross procedure 01/31/2015   Thoracic aortic aneurysm (HCC) 12/11/2013   Ganglion cyst 06/01/2013   Brachial plexus neuropathy 10/07/2011   Cervical disc disease 10/07/2011   General medical examination 03/11/2011   Carpopedal spasm 02/25/2011   Arthritis of hand 02/25/2011   CARPAL TUNNEL SYNDROME, BILATERAL 06/10/2009   AORTIC VALVE REPLACEMENT, HX OF 06/10/2009   ESOPHAGEAL STRICTURE 03/29/2003   GERD 03/29/2003   HIATAL HERNIA 10/21/2000   Social History   Tobacco Use   Smoking status: Never    Passive exposure: Current (wife smokes in car)   Smokeless tobacco: Never  Substance Use Topics   Alcohol use: No    Current Outpatient Medications:    apixaban (ELIQUIS) 5 MG TABS tablet, Take 1 tablet (5 mg total) by mouth 2 (two) times daily., Disp: 180 tablet, Rfl: 3   azelastine (ASTELIN) 0.1 % nasal spray, Place 1 spray into both nostrils as needed for rhinitis. Use in each nostril as directed, Disp: , Rfl:    cetirizine (ZYRTEC) 10 MG tablet, TAKE 1 TABLET(10 MG) BY MOUTH DAILY, Disp: 90 tablet, Rfl: 0    empagliflozin (JARDIANCE) 10 MG TABS tablet, Take 1 tablet (10 mg total) by mouth daily before breakfast., Disp: 90 tablet, Rfl: 3   famotidine (PEPCID) 40 MG tablet, Take 40 mg by mouth as needed for heartburn or indigestion., Disp: , Rfl:    finasteride (PROSCAR) 5 MG tablet, TAKE 1 TABLET(5 MG) BY MOUTH DAILY, Disp: 90 tablet, Rfl: 1   fluticasone (FLONASE) 50 MCG/ACT nasal spray, Place 2 sprays into both nostrils as needed for allergies or rhinitis., Disp: 50 mL, Rfl: 30   furosemide (LASIX) 20 MG tablet, TAKE 1 TABLET(20 MG) BY MOUTH EVERY OTHER DAY, Disp: 15 tablet, Rfl: 11   ipratropium (ATROVENT) 0.06 % nasal spray, Place 2 sprays into both nostrils daily., Disp: , Rfl:    losartan (COZAAR) 25 MG tablet, Take 1 tablet (25 mg total) by mouth daily., Disp: 90 tablet, Rfl: 3   metoprolol succinate (TOPROL-XL) 100 MG 24 hr tablet, Take 1 tablet (100 mg total) by mouth 2 (two) times daily. Take with or immediately following a meal., Disp: 180 tablet, Rfl: 3   montelukast (SINGULAIR) 10 MG tablet, TAKE 1 TABLET(10 MG) BY MOUTH AT BEDTIME, Disp: 30 tablet, Rfl: 6   pantoprazole (PROTONIX) 40 MG tablet, Take 1 tablet (40 mg total) by mouth daily., Disp: 90 tablet, Rfl: 1   rosuvastatin (CRESTOR) 40 MG tablet, TAKE 1 TABLET(40 MG) BY MOUTH DAILY, Disp: 90 tablet, Rfl: 3   spironolactone (ALDACTONE) 25 MG tablet, TAKE 1 TABLET(25 MG) BY MOUTH DAILY, Disp: 90 tablet, Rfl: 3  Allergies  Allergen Reactions  Penicillins Hives and Swelling   Quinidine Other (See Comments)    Increase heart beat per patient    Assessment and Plan:   Diagnosis: frontal HA's and neck pain. Please see myChart communication and orders below.   Orders Placed This Encounter  Procedures   MR BRAIN WO CONTRAST   MR CERVICAL SPINE WO CONTRAST   Ambulatory referral to Neurosurgery   No orders of the defined types were placed in this encounter.   Neena Rhymes, MD 10/20/2023  A total of 14 minutes were spent by  me to personally review the patient-generated inquiry, review patient records and data pertinent to assessment of the patient's problem, develop a management plan including generation of prescriptions and/or orders, and on subsequent communication with the patient through secure the MyChart portal service.   There is no separately reported E/M service related to this service in the past 7 days nor does the patient have an upcoming soonest available appointment for this issue. This work was completed in less than 7 days.   The patient consented to this service today (see patient agreement prior to ongoing communication). Patient counseled regarding the need for in-person exam for certain conditions and was advised to call the office if any changing or worsening symptoms occur.   The codes to be used for the E/M service are: []   99421 for 5-10 minutes of time spent on the inquiry. [x]   [id number] for 11-20 minutes. []   [id number] for 21+ minutes.

## 2023-10-23 ENCOUNTER — Encounter (HOSPITAL_COMMUNITY): Payer: Self-pay | Admitting: Cardiology

## 2023-10-25 ENCOUNTER — Other Ambulatory Visit (HOSPITAL_COMMUNITY): Payer: Self-pay

## 2023-10-25 MED ORDER — FUROSEMIDE 20 MG PO TABS: 20.0000 mg | ORAL_TABLET | ORAL | 3 refills | Status: AC

## 2023-10-26 ENCOUNTER — Encounter: Payer: Self-pay | Admitting: Dermatology

## 2023-10-26 ENCOUNTER — Ambulatory Visit: Payer: PPO | Admitting: Dermatology

## 2023-10-26 VITALS — BP 175/81

## 2023-10-26 DIAGNOSIS — W908XXA Exposure to other nonionizing radiation, initial encounter: Secondary | ICD-10-CM

## 2023-10-26 DIAGNOSIS — L814 Other melanin hyperpigmentation: Secondary | ICD-10-CM

## 2023-10-26 DIAGNOSIS — L820 Inflamed seborrheic keratosis: Secondary | ICD-10-CM | POA: Diagnosis not present

## 2023-10-26 DIAGNOSIS — D492 Neoplasm of unspecified behavior of bone, soft tissue, and skin: Secondary | ICD-10-CM | POA: Diagnosis not present

## 2023-10-26 DIAGNOSIS — L821 Other seborrheic keratosis: Secondary | ICD-10-CM | POA: Diagnosis not present

## 2023-10-26 DIAGNOSIS — L578 Other skin changes due to chronic exposure to nonionizing radiation: Secondary | ICD-10-CM | POA: Diagnosis not present

## 2023-10-26 DIAGNOSIS — D1801 Hemangioma of skin and subcutaneous tissue: Secondary | ICD-10-CM

## 2023-10-26 DIAGNOSIS — L738 Other specified follicular disorders: Secondary | ICD-10-CM

## 2023-10-26 DIAGNOSIS — Z1283 Encounter for screening for malignant neoplasm of skin: Secondary | ICD-10-CM

## 2023-10-26 DIAGNOSIS — D229 Melanocytic nevi, unspecified: Secondary | ICD-10-CM

## 2023-10-26 DIAGNOSIS — L570 Actinic keratosis: Secondary | ICD-10-CM

## 2023-10-26 DIAGNOSIS — D489 Neoplasm of uncertain behavior, unspecified: Secondary | ICD-10-CM

## 2023-10-26 DIAGNOSIS — C44621 Squamous cell carcinoma of skin of unspecified upper limb, including shoulder: Secondary | ICD-10-CM

## 2023-10-26 DIAGNOSIS — L739 Follicular disorder, unspecified: Secondary | ICD-10-CM | POA: Diagnosis not present

## 2023-10-26 NOTE — Progress Notes (Signed)
 Follow-Up Visit   Subjective  [name] [name] is a 73 y.o. male who presents for the following: yearly TBSE  Patient present today for follow up visit for yearly tbse. Patient was last evaluated on July. Patient denies medication changes.  The following portions of the chart were reviewed this encounter and updated as appropriate: medications, allergies, medical history  Review of Systems:  No other skin or systemic complaints except as noted in HPI or Assessment and Plan.  Objective  Well appearing patient in no apparent distress; mood and affect are within normal limits.  A full examination was performed including scalp, head, eyes, ears, nose, lips, neck, chest, axillae, abdomen, back, buttocks, bilateral upper extremities, bilateral lower extremities, hands, feet, fingers, toes, fingernails, and toenails. All findings within normal limits unless otherwise noted below.   A focused examination was performed of the following areas: TBSE  Relevant exam findings are noted in the Assessment and Plan.  Left Nasal Sidewall 5ml pink pearly papule   Assessment & Plan    Biopsy Proven Squamous Cell Carcinoma of the Arm - Assessment: Previously biopsied squamous cell carcinoma on the arm. Bulk of lesion removed, but microscopic squamous cells remain. Risk of metastasis if untreated. Radiation therapy not recommended. - Plan:    Refer to Dr. Pasi for surgical excision    Dr. Pasi will mark the area, excise the lesion, and suture the wound    Send tissue for pathology to confirm clear margins   LENTIGINES, SEBORRHEIC KERATOSES, HEMANGIOMAS - Benign normal skin lesions - Benign-appearing - Call for any changes  MELANOCYTIC NEVI - Tan-brown and/or pink-flesh-colored symmetric macules and papules - Benign appearing on exam today - Observation - Call clinic for new or changing moles - Recommend daily use of broad spectrum spf 30+ sunscreen to sun-exposed areas.   ACTINIC DAMAGE -  Chronic condition, secondary to cumulative UV/sun exposure - diffuse scaly erythematous macules with underlying dyspigmentation - Recommend daily broad spectrum sunscreen SPF 30+ to sun-exposed areas, reapply every 2 hours as needed.  - Staying in the shade or wearing long sleeves, sun glasses (UVA+UVB protection) and wide brim hats (4-inch brim around the entire circumference of the hat) are also recommended for sun protection.  - Call for new or changing lesions.  . Suspicious Basal Cell Carcinoma - Assessment: 5-millimeter pink pearly papule observed on the left nasal crease, suspicious for basal cell carcinoma. - Plan:    Perform shave biopsy of the lesion on the left nasal crease    Send specimen for pathology with differential diagnosis of basal cell carcinoma    Inform patient that if biopsy confirms skin cancer, referral to surgeon will be made; if negative, allow to heal  5. Inflamed Seborrheic Keratoses - Assessment: Multiple inflamed seborrheic keratoses identified on the abdomen. - Plan:    Perform cryotherapy for approximately 10 inflamed seborrheic keratoses on the abdomen    Inform patient that treated areas will become crusty and fall off  SKIN CANCER SCREENING PERFORMED TODAY    ACTINIC KERATOSIS Nose Destruction of lesion - Nose Complexity: simple   Destruction method: cryotherapy   Informed consent: discussed and consent obtained   Timeout:  patient name, date of birth, surgical site, and procedure verified Lesion destroyed using liquid nitrogen: Yes   Region frozen until ice ball extended beyond lesion: Yes   Outcome: patient tolerated procedure well with no complications   Post-procedure details: wound care instructions given   NEOPLASM OF UNCERTAIN BEHAVIOR Left Nasal  Sidewall Skin / nail biopsy Type of biopsy: tangential   Informed consent: discussed and consent obtained   Timeout: patient name, date of birth, surgical site, and procedure verified    Procedure prep:  Patient was prepped and draped in usual sterile fashion Prep type:  Isopropyl alcohol Anesthesia: the lesion was anesthetized in a standard fashion   Anesthetic:  1% lidocaine w/ epinephrine 1-100,000 buffered w/ 8.4% NaHCO3 Instrument used: flexible razor blade   Hemostasis achieved with: pressure, aluminum chloride and electrodesiccation   Outcome: patient tolerated procedure well   Post-procedure details: sterile dressing applied and wound care instructions given   Dressing type: bandage and petrolatum   Specimen 1 - Surgical pathology Differential Diagnosis: R/o  BCC  Check Margins: No  INFLAMED SEBORRHEIC KERATOSIS (10) Right Abdomen (side) - Upper (10) Destruction of lesion - Right Abdomen (side) - Upper (10) Complexity: simple   Destruction method: cryotherapy   Informed consent: discussed and consent obtained   Timeout:  patient name, date of birth, surgical site, and procedure verified Lesion destroyed using liquid nitrogen: Yes   Region frozen until ice ball extended beyond lesion: Yes   Outcome: patient tolerated procedure well with no complications   Post-procedure details: wound care instructions given    Return in about 1 year (around 10/25/2024) for TBSE.  [name] [name], CMA, am acting as scribe for [name], DO.   Documentation: I have reviewed the above documentation for accuracy and completeness, and I agree with the above.  Langston Reusing, DO

## 2023-10-26 NOTE — Patient Instructions (Addendum)

## 2023-10-27 LAB — SURGICAL PATHOLOGY

## 2023-10-27 NOTE — Addendum Note (Signed)
 Addended by: Reesa Chew on: 10/27/2023 06:48 PM   Modules accepted: Orders

## 2023-10-28 ENCOUNTER — Encounter: Payer: Self-pay | Admitting: Dermatology

## 2023-10-28 NOTE — Progress Notes (Signed)
 Hi [name] [name]  Dr. Onalee Hua reviewed your biopsy results and they showed the spot removed was benign (not cancerous).  No additional treatment is required.  The detailed report is available to view in MyChart.  Have a great day!  Kind Regards,  Dr. Kermit Balo Care Team

## 2023-10-29 ENCOUNTER — Encounter: Payer: PPO | Admitting: Family Medicine

## 2023-10-31 ENCOUNTER — Ambulatory Visit (HOSPITAL_BASED_OUTPATIENT_CLINIC_OR_DEPARTMENT_OTHER)

## 2023-11-03 ENCOUNTER — Ambulatory Visit (HOSPITAL_BASED_OUTPATIENT_CLINIC_OR_DEPARTMENT_OTHER)
Admission: RE | Admit: 2023-11-03 | Discharge: 2023-11-03 | Disposition: A | Discharge status: Home / Self Care | Source: Ambulatory Visit | Attending: Family Medicine | Admitting: Family Medicine

## 2023-11-03 DIAGNOSIS — M47812 Spondylosis without myelopathy or radiculopathy, cervical region: Secondary | ICD-10-CM | POA: Diagnosis not present

## 2023-11-03 DIAGNOSIS — M47811 Spondylosis without myelopathy or radiculopathy, occipito-atlanto-axial region: Secondary | ICD-10-CM | POA: Diagnosis not present

## 2023-11-03 DIAGNOSIS — R519 Headache, unspecified: Secondary | ICD-10-CM | POA: Diagnosis not present

## 2023-11-03 DIAGNOSIS — M5090 Cervical disc disorder, unspecified, unspecified cervical region: Secondary | ICD-10-CM | POA: Diagnosis not present

## 2023-11-03 DIAGNOSIS — M542 Cervicalgia: Secondary | ICD-10-CM | POA: Diagnosis not present

## 2023-11-03 DIAGNOSIS — M4802 Spinal stenosis, cervical region: Secondary | ICD-10-CM | POA: Diagnosis not present

## 2023-11-03 DIAGNOSIS — R9089 Other abnormal findings on diagnostic imaging of central nervous system: Secondary | ICD-10-CM | POA: Diagnosis not present

## 2023-11-05 NOTE — Progress Notes (Unsigned)
 [address]       [place] [address]             [phone]        [name] [name] [id number] 05-18-1951  History of Present Illness: [name] [name] is a 73 year old man with a history of congenital aortic insufficiency, Ross procedure (in [place] in [place]), neoaortic root and ascending aneurysms, hiatal hernia, reflux, esophageal stricture, esophageal diverticulum (s/p diverticulectomy and esophagomyotomy by Dr. Deloise Ferries 03/2022), hypertension, left brachial plexopathy, and elevated left hemidiaphragm. The patient has a stable dilated neoaortic root measuring up to 5.6cm dating back to 2015. He was followed by Dr. Nicanor Barge starting in 2016 with annual scans and then has been followed by Dr. Luna Salinas since 2022. Last year the ascending aortic aneurysm was stable measuring 4.5cm and there was a stable outpouching of the left sinus of valsalva measuring up to 2.0cm.  He was also followed by Dr. Deloise Ferries from 2023 to 2024 for Zenkers diverticulum for which he had a diverticulectomy.  He denies family history of ATAA and personal history of connective tissue disorder.   Today the patient reports he has had a pretty good year and feels well. He states he has lost some weight due to working outside and eating lower calorie diets with his wife. The patient denies chest pain, shortness of breath, dizziness and LOC. He does report he was having massive headaches but these have improved since he started using CPAP about 6 months ago.   He also admits to ongoing dysphagia, continues to undergo esophageal dilation which helps. He also tried botox treatment and felt he couldn't swallow after that.   Current Outpatient Medications on File Prior to Visit  Medication Sig Dispense Refill   apixaban (ELIQUIS) 5 MG TABS tablet Take 1 tablet (5 mg total) by mouth 2 (two) times daily. 180 tablet 3   azelastine (ASTELIN) 0.1 % nasal spray Place 1 spray into both nostrils as needed for  rhinitis. Use in each nostril as directed     cetirizine (ZYRTEC) 10 MG tablet TAKE 1 TABLET(10 MG) BY MOUTH DAILY 90 tablet 0   empagliflozin (JARDIANCE) 10 MG TABS tablet Take 1 tablet (10 mg total) by mouth daily before breakfast. 90 tablet 3   famotidine (PEPCID) 40 MG tablet Take 40 mg by mouth as needed for heartburn or indigestion.     finasteride (PROSCAR) 5 MG tablet TAKE 1 TABLET(5 MG) BY MOUTH DAILY 90 tablet 1   fluticasone (FLONASE) 50 MCG/ACT nasal spray Place 2 sprays into both nostrils as needed for allergies or rhinitis. 50 mL 30   furosemide (LASIX) 20 MG tablet Take 1 tablet (20 mg total) by mouth every other day. 45 tablet 3   ipratropium (ATROVENT) 0.06 % nasal spray Place 2 sprays into both nostrils daily.     losartan (COZAAR) 25 MG tablet Take 1 tablet (25 mg total) by mouth daily. 90 tablet 3   metoprolol succinate (TOPROL-XL) 100 MG 24 hr tablet Take 1 tablet (100 mg total) by mouth 2 (two) times daily. Take with or immediately following a meal. 180 tablet 3   montelukast (SINGULAIR) 10 MG tablet TAKE 1 TABLET(10 MG) BY MOUTH AT BEDTIME 30 tablet 6   pantoprazole (PROTONIX) 40 MG tablet Take 1 tablet (40 mg total) by mouth daily. 90 tablet 1   rosuvastatin (CRESTOR) 40 MG tablet TAKE 1 TABLET(40 MG) BY MOUTH DAILY 90 tablet 3   spironolactone (ALDACTONE) 25  MG tablet TAKE 1 TABLET(25 MG) BY MOUTH DAILY 90 tablet 3   No current facility-administered medications on file prior to visit.   Vitals: Today's Vitals   11/18/23 1254  BP: (!) 143/81  Pulse: (!) 58  Resp: 20  SpO2: 99%  Height: 6\' 3"  (1.905 m)   Body mass index is 27.5 kg/m.  Physical Exam General: Alert and oriented, no acute distress Neuro: Grossly intact CV: 3/6 mid to end systolic murmur RUSB, regular rate and rhythm Pulm: Clear to auscultation bilaterally GI: Nontender Extremities: No edema BLE, 2+ radial pulses bilaterally  CTA Results: CLINICAL DATA:  Aortic aneurysm suspected   EXAM: CT  ANGIOGRAPHY CHEST WITH CONTRAST   TECHNIQUE: Multidetector CT imaging of the chest was performed using the standard protocol during bolus administration of intravenous contrast. Multiplanar CT image reconstructions and MIPs were obtained to evaluate the vascular anatomy.   RADIATION DOSE REDUCTION: This exam was performed according to the departmental dose-optimization program which includes automated exposure control, adjustment of the mA and/or kV according to patient size and/or use of iterative reconstruction technique.   CONTRAST:  75mL ISOVUE-370 IOPAMIDOL (ISOVUE-370) INJECTION 76%   COMPARISON:  None Available.   FINDINGS: Pulmonary Embolism: No pulmonary embolism.   Cardiovascular: Mild cardiomegaly. No pericardial effusion. Dense multi-vessel coronary atherosclerosis. Unchanged dilation of the left sinus of Valsalva, measuring approximate 2.2 cm. Aortic tube graft repair of the ascending aorta, unchanged. Residual aneurysmal dilation of the aortic root and most proximal ascending aorta, measuring 4.7 cm. Diffuse atherosclerosis throughout the aorta.   Mediastinum/Nodes: No mediastinal mass. No mediastinal, hilar, or axillary lymphadenopathy. Calcified right hilar and mediastinal lymph nodes, consistent with chronic granulomatous infection.   Lungs/Pleura: The midline trachea and bronchi are patent. No focal airspace consolidation, pleural effusion, or pneumothorax. Subsegmental atelectasis in the lingula and left lower lobe. Calcified granuloma in the right lower lobe.   Musculoskeletal: No acute fracture or destructive bone lesion. Multilevel degenerative disc disease of the spine. Thoracic DISH. Sternotomy wires.   Upper Abdomen: No acute abnormality in the partially visualized upper abdomen. Punctate radiopaque cholecystolithiasis. Nonobstructive left nephrolithiasis.   Review of the MIP images confirms the above findings.   IMPRESSION: 1. No acute  intrathoracic abnormality; specifically, no pulmonary embolism, pneumonia, or pleural effusion. 2. Aortic tube graft repair of the ascending aorta, unchanged. Residual aneurysmal dilation of the aortic root, measuring 4.7 cm, which extends into the most proximal portion of the ascending aorta. Similar dilation of the left sinus of Valsalva. The 3. Cholecystolithiasis.   Aortic aneurysm NOS (ICD10-I71.9).     Electronically Signed   By: Rance Burrows M.D.   On: 11/11/2023 12:39  Impression and Plan: ATAA: Mr. Conroy presents to the office with residual aneurysmal dilation of the aortic root and most proximal ascending aorta measuring 4.7 cm, slightly increased from 4.5cm on CT scan 6 months ago.  Echocardiogram shows s/p Ross procedure with pulmonary valve autograft in the aortic position and trivial aortic insufficiency. We discussed the natural history and and risk factors for growth of ascending aortic aneurysms. We covered the importance of tight blood pressure control, refraining from lifting heavy objects, and avoiding fluoroquinolones. The patient is aware of signs and symptoms of aortic dissection and when to present to the emergency department. He does not yet meet surgical criteria of 5.5cm at this time. As discussed with Dr. Luna Salinas we will continue surveillance. Plan to have the patient return to clinic with chest CTA in 6 months.  HTN: BP elevated in the clinic today but has been well controlled at past appointments with other providers. The patient states he checks his blood pressure at home about once per week and it usually runs 125-130/70-75. I told him to continue to monitor this at home and if it remains >130/80 to call his PCP.   Zenkers diverticulum: Patient has not had any problems since surgery. He does admit to continued dysphagia. He will undergo his 3rd esophageal dilation next month. He did get a botox treatment as well but felt he could not swallow at all  following that so will not do it again. Continue to follow up with GI.   Risk Modification:  Statin:  Rosuvastatin  Smoking cessation instruction/counseling given:  nonsmoker  Patient was counseled on importance of Blood Pressure Control.  Despite Medical intervention if the patient notices persistently elevated blood pressure readings.  They are instructed to contact their Primary Care Physician  Please avoid use of Fluoroquinolones as this can potentially increase your risk of Aortic Rupture and/or Dissection  Patient educated on signs and symptoms of Aortic Dissection, handout also provided in AVS  Randa Burton, PA-C 11/05/23

## 2023-11-05 NOTE — Patient Instructions (Signed)

## 2023-11-09 ENCOUNTER — Encounter: Payer: Self-pay | Admitting: Cardiology

## 2023-11-09 ENCOUNTER — Ambulatory Visit: Attending: Cardiology | Admitting: Cardiology

## 2023-11-09 VITALS — BP 130/75 | HR 58 | Resp 16 | Ht 75.0 in | Wt 220.0 lb

## 2023-11-09 DIAGNOSIS — G4733 Obstructive sleep apnea (adult) (pediatric): Secondary | ICD-10-CM

## 2023-11-09 DIAGNOSIS — I10 Essential (primary) hypertension: Secondary | ICD-10-CM | POA: Diagnosis not present

## 2023-11-09 NOTE — Patient Instructions (Signed)
 Medication Instructions:  Your physician recommends that you continue on your current medications as directed. Please refer to the Current Medication list given to you today.  *If you need a refill on your cardiac medications before your next appointment, please call your pharmacy*  Lab Work: NONE If you have labs (blood work) drawn today and your tests are completely normal, you will receive your results only by: MyChart Message (if you have MyChart) OR A paper copy in the mail If you have any lab test that is abnormal or we need to change your treatment, we will call you to review the results.  Testing/Procedures: NONE  Follow-Up: At [facility], you and your health needs are our priority.  As part of our continuing mission to provide you with exceptional heart care, our providers are all part of one team.  This team includes your primary Cardiologist (physician) and Advanced Practice Providers or APPs (Physician Assistants and Nurse Practitioners) who all work together to provide you with the care you need, when you need it.  Your next appointment:   1 year(s)  Provider:   Mayford Knife, MD  We recommend signing up for the patient portal called "MyChart".  Sign up information is provided on this After Visit Summary.  MyChart is used to connect with patients for Virtual Visits (Telemedicine).  Patients are able to view lab/test results, encounter notes, upcoming appointments, etc.  Non-urgent messages can be sent to your provider as well.   To learn more about what you can do with MyChart, go to [email or web address].   Other Instructions       1st Floor: - Lobby - Registration  - Pharmacy  - Lab - Cafe  2nd Floor: - PV Lab - Diagnostic Testing (echo, CT, nuclear med)  3rd Floor: - Vacant  4th Floor: - TCTS (cardiothoracic surgery) - AFib Clinic - Structural Heart Clinic - Vascular Surgery  - Vascular Ultrasound  5th Floor: - [facility] (general  and EP) - Clinical Pharmacy for coumadin, hypertension, lipid, weight-loss medications, and med management appointments    Valet parking services will be available as well.

## 2023-11-09 NOTE — Progress Notes (Signed)
 Sleep Medicine CONSULT Note    Date:  11/09/2023   ID:  [name] [name], DOB [birth year], MRN [id number]  PCP:  Sheliah Hatch, MD  Cardiologist: Marca Ancona, MD   Chief Complaint  Patient presents with   New Patient (Initial Visit)    Obstructive sleep apnea     History of Present Illness:  [name] [name] is a 73 y.o. male who is being seen today for the evaluation of obstructive sleep apnea at the request of Dr. Shirlee Latch, MD.  This is a 73 year old male with a history of aortic insufficiency status post Ross procedure, aortic root aneurysm, atrial flutter, CHF, diabetes type 2, GERD with esophageal stricture, hyperlipidemia.  He has a remote history of severe sleep but declined CPAP in 2021.    About 6 months ago started having severe HAs.  After Dr. Shirlee Latch saw him 05/23/2023 he was set up for a home sleep study which showed mild obstructive sleep apnea overall but moderate during REM sleep with REM AHI 16.6/h and overall AHI 6.6/h.  In auto CPAP from 4 to 15 cm H2O was ordered and patient is now referred for sleep medicine consultation to establish sleep care and treatment of obstructive sleep apnea  He is doing well with his PAP device and thinks that he has gotten used to it.  He tolerates the full face mask and feels the pressure is adequate.  Since going on PAP he feels sometimes more rested in the am than int the past and has no significant daytime sleepiness.  He denies any significant mouth or nasal dryness or nasal congestion.  He does not think that he snores.  His HAs have completely resolved.   Past Medical History:  Diagnosis Date   Acute gastritis    Aortic insufficiency    s/p Ross Procedure: AVR/Pulmonary autograft in aortic position & PVR with bioprosthetic PV 1994 in [place]   Aortic root aneurysm    Atrial flutter (HCC)    history- no atrial flutter since cardioversion   CHF (congestive heart failure) (HCC)    Diabetes mellitus without complication  (HCC)    type 2   Early satiety    Esophageal stricture    GERD (gastroesophageal reflux disease)    Headache syndrome 10/18/2018   Hiatal hernia    Hyperlipidemia    Neuropathy    left brachial plexus   OSA on CPAP    mild obstructive sleep apnea overall but moderate during REM sleep with REM AHI 16.6/h and overall AHI 6.6/h.  In auto CPAP from 4 to 15 cm H2O   Pain, dental    SCC (squamous cell carcinoma) 02/10/2023   right forearm    Past Surgical History:  Procedure Laterality Date   AORTIC VALVE REPLACEMENT  1994   CARDIOVERSION N/A 03/14/2020   Procedure: CARDIOVERSION;  Surgeon: Laurey Morale, MD;  Location: [facility] ENDOSCOPY;  Service: Cardiovascular;  Laterality: N/A;   ESOPHAGEAL MANOMETRY N/A 09/23/2022   Procedure: ESOPHAGEAL MANOMETRY (EM);  Surgeon: Napoleon Form, MD;  Location: [facility] ENDOSCOPY;  Service: Gastroenterology;  Laterality: N/A;   ESOPHAGOGASTRODUODENOSCOPY N/A 03/30/2022   Procedure: ESOPHAGOGASTRODUODENOSCOPY (EGD);  Surgeon: Corliss Skains, MD;  Location: [facility] OR;  Service: Thoracic;  Laterality: N/A;   GANGLION CYST EXCISION  JAN 2002   HIATAL HERNIA REPAIR  DEC 2002   INTERCOSTAL NERVE BLOCK Right 03/30/2022   Procedure: INTERCOSTAL NERVE BLOCK;  Surgeon: Corliss Skains, MD;  Location: [facility] OR;  Service: Thoracic;  Laterality: Right;   TEE WITHOUT CARDIOVERSION N/A 03/14/2020   Procedure: TRANSESOPHAGEAL ECHOCARDIOGRAM (TEE);  Surgeon: Laurey Morale, MD;  Location: [facility] ENDOSCOPY;  Service: Cardiovascular;  Laterality: N/A;   TONSILLECTOMY     VASECTOMY  DEC 2002    Current Medications: Current Meds  Medication Sig   apixaban (ELIQUIS) 5 MG TABS tablet Take 1 tablet (5 mg total) by mouth 2 (two) times daily.   azelastine (ASTELIN) 0.1 % nasal spray Place 1 spray into both nostrils as needed for rhinitis. Use in each nostril as directed   cetirizine (ZYRTEC) 10 MG tablet TAKE 1 TABLET(10 MG) BY MOUTH DAILY   empagliflozin (JARDIANCE) 10 MG  TABS tablet Take 1 tablet (10 mg total) by mouth daily before breakfast.   famotidine (PEPCID) 40 MG tablet Take 40 mg by mouth as needed for heartburn or indigestion.   finasteride (PROSCAR) 5 MG tablet TAKE 1 TABLET(5 MG) BY MOUTH DAILY   fluticasone (FLONASE) 50 MCG/ACT nasal spray Place 2 sprays into both nostrils as needed for allergies or rhinitis.   furosemide (LASIX) 20 MG tablet Take 1 tablet (20 mg total) by mouth every other day.   ipratropium (ATROVENT) 0.06 % nasal spray Place 2 sprays into both nostrils daily.   losartan (COZAAR) 25 MG tablet Take 1 tablet (25 mg total) by mouth daily.   metoprolol succinate (TOPROL-XL) 100 MG 24 hr tablet Take 1 tablet (100 mg total) by mouth 2 (two) times daily. Take with or immediately following a meal.   montelukast (SINGULAIR) 10 MG tablet TAKE 1 TABLET(10 MG) BY MOUTH AT BEDTIME   pantoprazole (PROTONIX) 40 MG tablet Take 1 tablet (40 mg total) by mouth daily.   rosuvastatin (CRESTOR) 40 MG tablet TAKE 1 TABLET(40 MG) BY MOUTH DAILY   spironolactone (ALDACTONE) 25 MG tablet TAKE 1 TABLET(25 MG) BY MOUTH DAILY    Allergies:   Penicillins and Quinidine   Social History   Socioeconomic History   Marital status: Married    Spouse name: [name] [name]    Number of children: 3   Years of education: Not on file   Highest education level: Master's degree (e.g., MA, MS, MEng, MEd, MSW, MBA)  Occupational History   Occupation: retired  Tobacco Use   Smoking status: Never    Passive exposure: Current (wife smokes in car)   Smokeless tobacco: Never  Vaping Use   Vaping status: Never Used  Substance and Sexual Activity   Alcohol use: No   Drug use: No   Sexual activity: Not on file  Other Topics Concern   Not on file  Social History Narrative   Caffeine 1-2 cups daily    Right handed   Live in [place] with spouse [name] [name]    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   Social Drivers of Research scientist (physical sciences) Strain: Low Risk  (07/11/2023)   Overall Financial Resource Strain (CARDIA)    Difficulty of Paying Living Expenses: Not very hard  Food Insecurity: No Food Insecurity (07/11/2023)   Hunger Vital Sign    Worried About Running Out of Food in the Last Year: Never true    Ran Out of Food in the Last Year: Never true  Transportation Needs: No Transportation Needs (07/11/2023)   PRAPARE - Administrator, Civil Service (Medical): No    Lack of Transportation (Non-Medical): No  Physical Activity: Insufficiently Active (07/11/2023)   Exercise Vital Sign  Days of Exercise per Week: 3 days    Minutes of Exercise per Session: 30 min  Stress: No Stress Concern Present (07/11/2023)   [employer] of Occupational Health - Occupational Stress Questionnaire    Feeling of Stress : Not at all  Social Connections: Moderately Isolated (07/11/2023)   Social Connection and Isolation Panel [NHANES]    Frequency of Communication with Friends and Family: Three times a week    Frequency of Social Gatherings with Friends and Family: Once a week    Attends Religious Services: Never    Database administrator or Organizations: No    Attends Engineer, structural: Not on file    Marital Status: Married     Family History:  The patient's family history includes Hypertension in an other family member; Leukemia in his brother; Lung cancer in his father; Prostate cancer in an other family member.   ROS:   Please see the history of present illness.    ROS All other systems reviewed and are negative.      No data to display             PHYSICAL EXAM:   VS:  BP 130/75 (BP Location: Left Arm, Patient Position: Sitting, Cuff Size: Normal)   Pulse (!) 58   Resp 16   Ht 6\' 3"  (1.905 m)   Wt 220 lb (99.8 kg)   SpO2 98%   BMI 27.50 kg/m    GEN: Well nourished, well developed, in no acute distress  HEENT: normal  Neck: no JVD, carotid bruits, or masses Cardiac: RRR; no  murmurs, rubs, or gallops,no edema.  Intact distal pulses bilaterally.  Respiratory:  clear to auscultation bilaterally, normal work of breathing GI: soft, nontender, nondistended, + BS MS: no deformity or atrophy  Skin: warm and dry, no rash Neuro:  Alert and Oriented x 3, Strength and sensation are intact Psych: euthymic mood, full affect  Wt Readings from Last 3 Encounters:  11/09/23 220 lb (99.8 kg)  09/27/23 220 lb 6.4 oz (100 kg)  09/10/23 218 lb 9.6 oz (99.2 kg)      Studies/Labs Reviewed:   Home sleep study  Recent Labs: 04/15/2023: ALT 28 09/10/2023: B Natriuretic Peptide 106.1; BUN 13; Creatinine, Ser 0.84; Hemoglobin 15.2; Magnesium 2.1; Platelets 256; Potassium 4.3; Sodium 137; TSH 1.422     ASSESSMENT:    1. OSA (obstructive sleep apnea)   2. Essential hypertension      PLAN:  In order of problems listed above:  OSA - The patient is tolerating PAP therapy well without any problems. The PAP download performed by his DME was personally reviewed and interpreted by me today and showed an AHI of 5.7/hr on auto CPAP from 4 to 15cm H2O with 93% compliance in using more than 4 hours nightly.  The patient has been using and benefiting from PAP use and will continue to benefit from therapy.  -His download that shows a large mask leak>>he has not changed out the mask cushion since he got it and he has had problems with it leaking and having to reposition at night.  -I encouraged him to try to call his DME to get supplies  Hypertension -BP controlled on exam today -Continue prescription drug management with losartan 25 mg daily and Toprol-XL 100 mg twice daily as well as spironolactone 25 mg daily with as needed refills   Time Spent:20 minutes total time of encounter, including 15 minutes spent in face-to-face patient care on  the date of this encounter. This time includes coordination of care and counseling regarding above mentioned problem list. Remainder of  non-face-to-face time involved reviewing chart documents/testing relevant to the patient encounter and documentation in the medical record. I have independently reviewed documentation from referring provider  Medication Adjustments/Labs and Tests Ordered: Current medicines are reviewed at length with the patient today.  Concerns regarding medicines are outlined above.  Medication changes, Labs and Tests ordered today are listed in the Patient Instructions below.  There are no Patient Instructions on file for this visit.   Signed, Armanda Magic, MD  11/09/2023 3:42 PM    [facility] [address] [place], [place], [place]  [address] Phone: [phone]; Fax: [phone]

## 2023-11-11 ENCOUNTER — Ambulatory Visit
Admission: RE | Admit: 2023-11-11 | Discharge: 2023-11-11 | Disposition: A | Discharge status: Home / Self Care | Source: Ambulatory Visit | Attending: Thoracic Surgery (Cardiothoracic Vascular Surgery) | Admitting: Thoracic Surgery (Cardiothoracic Vascular Surgery)

## 2023-11-11 ENCOUNTER — Encounter: Payer: Self-pay | Admitting: Family Medicine

## 2023-11-11 DIAGNOSIS — I7121 Aneurysm of the ascending aorta, without rupture: Secondary | ICD-10-CM | POA: Diagnosis not present

## 2023-11-11 DIAGNOSIS — G4733 Obstructive sleep apnea (adult) (pediatric): Secondary | ICD-10-CM | POA: Diagnosis not present

## 2023-11-11 DIAGNOSIS — I10 Essential (primary) hypertension: Secondary | ICD-10-CM | POA: Diagnosis not present

## 2023-11-11 MED ORDER — IOPAMIDOL (ISOVUE-370) INJECTION 76%
75.0000 mL | Freq: Once | INTRAVENOUS | Status: AC | PRN
  Administered 2023-11-11: 75 mL via INTRAVENOUS

## 2023-11-15 DIAGNOSIS — G4733 Obstructive sleep apnea (adult) (pediatric): Secondary | ICD-10-CM | POA: Diagnosis not present

## 2023-11-16 NOTE — Telephone Encounter (Signed)
 The results are in patients chart, can we have this resulted for patient?

## 2023-11-18 ENCOUNTER — Ambulatory Visit: Admitting: Physician Assistant

## 2023-11-18 VITALS — BP 143/81 | HR 58 | Resp 20 | Ht 75.0 in

## 2023-11-18 DIAGNOSIS — I7121 Aneurysm of the ascending aorta, without rupture: Secondary | ICD-10-CM | POA: Diagnosis not present

## 2023-11-22 ENCOUNTER — Encounter: Payer: Self-pay | Admitting: Family Medicine

## 2023-11-22 ENCOUNTER — Telehealth: Payer: Self-pay

## 2023-11-22 DIAGNOSIS — M5090 Cervical disc disorder, unspecified, unspecified cervical region: Secondary | ICD-10-CM

## 2023-11-22 DIAGNOSIS — M5136 Other intervertebral disc degeneration, lumbar region with discogenic back pain only: Secondary | ICD-10-CM

## 2023-11-22 NOTE — Telephone Encounter (Signed)
-----   Message from Laymon Priest sent at 11/22/2023  7:44 AM EDT ----- The MRI of your brain is unchanged since 2020.  This is good news!

## 2023-11-22 NOTE — Telephone Encounter (Signed)
 Pt has been notified.

## 2023-11-22 NOTE — Telephone Encounter (Signed)
-----   Message from Laymon Priest sent at 11/22/2023  7:43 AM EDT ----- The MRI of your neck shows multiple levels of narrowing that can impact nerves/cause pain.  I know we placed a referral to Neurosurgery last month but I'm not sure if that has been scheduled.  Please let me know!

## 2023-11-23 ENCOUNTER — Ambulatory Visit: Admitting: Dermatology

## 2023-11-23 NOTE — Addendum Note (Signed)
 Addended by: Mitsue Peery E on: 11/23/2023 08:04 AM   Modules accepted: Orders

## 2023-11-25 NOTE — Addendum Note (Signed)
 Addended by: Shina Wass E on: 11/25/2023 12:01 PM   Modules accepted: Orders

## 2023-11-25 NOTE — Telephone Encounter (Signed)
Referral to Neurosurgery placed.

## 2023-12-13 DIAGNOSIS — J3489 Other specified disorders of nose and nasal sinuses: Secondary | ICD-10-CM | POA: Diagnosis not present

## 2023-12-13 DIAGNOSIS — K219 Gastro-esophageal reflux disease without esophagitis: Secondary | ICD-10-CM | POA: Diagnosis not present

## 2023-12-13 DIAGNOSIS — K2950 Unspecified chronic gastritis without bleeding: Secondary | ICD-10-CM | POA: Diagnosis not present

## 2023-12-13 DIAGNOSIS — G4733 Obstructive sleep apnea (adult) (pediatric): Secondary | ICD-10-CM | POA: Diagnosis not present

## 2023-12-13 DIAGNOSIS — R1313 Dysphagia, pharyngeal phase: Secondary | ICD-10-CM | POA: Diagnosis not present

## 2023-12-13 DIAGNOSIS — Z79899 Other long term (current) drug therapy: Secondary | ICD-10-CM | POA: Diagnosis not present

## 2023-12-13 DIAGNOSIS — Z7901 Long term (current) use of anticoagulants: Secondary | ICD-10-CM | POA: Diagnosis not present

## 2023-12-15 DIAGNOSIS — G4733 Obstructive sleep apnea (adult) (pediatric): Secondary | ICD-10-CM | POA: Diagnosis not present

## 2023-12-16 DIAGNOSIS — Z6826 Body mass index (BMI) 26.0-26.9, adult: Secondary | ICD-10-CM | POA: Diagnosis not present

## 2023-12-16 DIAGNOSIS — M4722 Other spondylosis with radiculopathy, cervical region: Secondary | ICD-10-CM | POA: Diagnosis not present

## 2023-12-16 DIAGNOSIS — R29818 Other symptoms and signs involving the nervous system: Secondary | ICD-10-CM | POA: Diagnosis not present

## 2023-12-17 DIAGNOSIS — R1313 Dysphagia, pharyngeal phase: Secondary | ICD-10-CM | POA: Diagnosis not present

## 2023-12-17 DIAGNOSIS — R0989 Other specified symptoms and signs involving the circulatory and respiratory systems: Secondary | ICD-10-CM | POA: Diagnosis not present

## 2023-12-17 DIAGNOSIS — R059 Cough, unspecified: Secondary | ICD-10-CM | POA: Diagnosis not present

## 2023-12-21 ENCOUNTER — Encounter: Payer: Self-pay | Admitting: Dermatology

## 2023-12-21 ENCOUNTER — Ambulatory Visit: Admitting: Dermatology

## 2023-12-21 VITALS — BP 119/77 | HR 58 | Temp 98.0°F

## 2023-12-21 DIAGNOSIS — C44622 Squamous cell carcinoma of skin of right upper limb, including shoulder: Secondary | ICD-10-CM

## 2023-12-21 DIAGNOSIS — C4492 Squamous cell carcinoma of skin, unspecified: Secondary | ICD-10-CM

## 2023-12-21 DIAGNOSIS — L988 Other specified disorders of the skin and subcutaneous tissue: Secondary | ICD-10-CM | POA: Diagnosis not present

## 2023-12-21 NOTE — Progress Notes (Signed)
 Follow-Up Visit   Subjective  [name] [name] is a 73 y.o. male who presents for the following: Excision of a Well Differentiated Squamous Cell Carcinoma of the right forearm, biopsied by Dr. Myrtie Atkinson.  The following portions of the chart were reviewed this encounter and updated as appropriate: medications, allergies, medical history  Review of Systems:  No other skin or systemic complaints except as noted in HPI or Assessment and Plan.  Objective  Well appearing patient in no apparent distress; mood and affect are within normal limits.  A focused examination was performed of the following areas: Right forearm Relevant physical exam findings are noted in the Assessment and Plan.   Right Forearm - Posterior Healed biopsy site   Assessment & Plan   SQUAMOUS CELL CARCINOMA OF SKIN Right Forearm - Posterior Skin excision  Excision method:  elliptical Lesion length (cm):  1.1 Lesion width (cm):  0.5 Margin per side (cm):  0.5 Total excision diameter (cm):  2.1 Informed consent: discussed and consent obtained   Timeout: patient name, date of birth, surgical site, and procedure verified   Procedure prep:  Patient was prepped and draped in usual sterile fashion Prep type:  Chlorhexidine  Anesthesia: the lesion was anesthetized in a standard fashion   Anesthetic:  1% lidocaine  w/ epinephrine 1-100,000 buffered w/ 8.4% NaHCO3 Instrument used: #15 blade   Hemostasis achieved with: suture, pressure and electrodesiccation   Outcome: patient tolerated procedure well with no complications   Post-procedure details: sterile dressing applied and wound care instructions given   Dressing type: bandage and pressure dressing    Skin repair Complexity:  Complex Final length (cm):  4.2 Informed consent: discussed and consent obtained   Timeout: patient name, date of birth, surgical site, and procedure verified   Procedure prep:  Patient was prepped and draped in usual sterile fashion Prep  type:  Chlorhexidine  Anesthesia: the lesion was anesthetized in a standard fashion   Anesthetic:  1% lidocaine  w/ epinephrine 1-100,000 buffered w/ 8.4% NaHCO3 Reason for type of repair: reduce tension to allow closure and preserve normal anatomy   Undermining: area extensively undermined   Subcutaneous layers (deep stitches):  Suture size:  4-0 Suture type: Vicryl (polyglactin 910)   Stitches:  Buried vertical mattress Fine/surface layer approximation (top stitches):  Suture type: cyanoacrylate tissue glue   Suture removal (days):  0 Hemostasis achieved with: suture, pressure and electrodesiccation Outcome: patient tolerated procedure well with no complications   Post-procedure details: sterile dressing applied and wound care instructions given   Dressing type: bandage and pressure dressing   Specimen 1 - Surgical pathology Differential Diagnosis: SCC [id number] Check Margins: No  The surgical wound was then cleaned, prepped, and re-anesthetized as above. Wound edges were undermined extensively along at least one entire edge and at a distance equal to or greater than the width of the defect (see wound defect size above) in order to achieve closure and decrease wound tension and anatomic distortion. Redundant tissue repair including standing cone removal was performed. Hemostasis was achieved with electrocautery. Subcutaneous and epidermal tissues were approximated with the above sutures. The surgical site was then lightly scrubbed with sterile, saline-soaked gauze. Steri-strips were applied, and the area was then bandaged using Vaseline ointment, non-adherent gauze, gauze pads, and tape to provide an adequate pressure dressing. The patient tolerated the procedure well, was given detailed written and verbal wound care instructions, and was discharged in good condition.   The patient will follow-up: PRN.  Return in about 6  months (around 06/22/2024) for TBSE.  I, Haig Levan, Surg Tech  III, am acting as scribe for Deneise Finlay, MD.   Documentation: I have reviewed the above documentation for accuracy and completeness, and I agree with the above.  Deneise Finlay, MD

## 2023-12-21 NOTE — Patient Instructions (Signed)

## 2023-12-22 DIAGNOSIS — J385 Laryngeal spasm: Secondary | ICD-10-CM | POA: Diagnosis not present

## 2023-12-22 DIAGNOSIS — R1314 Dysphagia, pharyngoesophageal phase: Secondary | ICD-10-CM | POA: Diagnosis not present

## 2023-12-22 DIAGNOSIS — J392 Other diseases of pharynx: Secondary | ICD-10-CM | POA: Diagnosis not present

## 2023-12-22 DIAGNOSIS — K220 Achalasia of cardia: Secondary | ICD-10-CM | POA: Diagnosis not present

## 2023-12-28 LAB — SURGICAL PATHOLOGY

## 2023-12-29 ENCOUNTER — Ambulatory Visit: Payer: Self-pay | Admitting: Dermatology

## 2023-12-31 ENCOUNTER — Other Ambulatory Visit: Payer: Self-pay

## 2023-12-31 MED ORDER — MONTELUKAST SODIUM 10 MG PO TABS: 10.0000 mg | ORAL_TABLET | Freq: Every day | ORAL | 6 refills | Status: DC

## 2024-01-05 ENCOUNTER — Ambulatory Visit: Payer: Self-pay | Admitting: Family Medicine

## 2024-01-05 ENCOUNTER — Encounter: Payer: Self-pay | Admitting: Family Medicine

## 2024-01-05 ENCOUNTER — Ambulatory Visit (INDEPENDENT_AMBULATORY_CARE_PROVIDER_SITE_OTHER): Admitting: Family Medicine

## 2024-01-05 VITALS — BP 122/74 | HR 66 | Temp 98.0°F | Ht 73.0 in | Wt 214.5 lb

## 2024-01-05 DIAGNOSIS — R29818 Other symptoms and signs involving the nervous system: Secondary | ICD-10-CM | POA: Insufficient documentation

## 2024-01-05 DIAGNOSIS — E118 Type 2 diabetes mellitus with unspecified complications: Secondary | ICD-10-CM

## 2024-01-05 DIAGNOSIS — D2370 Other benign neoplasm of skin of unspecified lower limb, including hip: Secondary | ICD-10-CM | POA: Insufficient documentation

## 2024-01-05 DIAGNOSIS — Z7984 Long term (current) use of oral hypoglycemic drugs: Secondary | ICD-10-CM

## 2024-01-05 DIAGNOSIS — Z0000 Encounter for general adult medical examination without abnormal findings: Secondary | ICD-10-CM

## 2024-01-05 DIAGNOSIS — L814 Other melanin hyperpigmentation: Secondary | ICD-10-CM | POA: Insufficient documentation

## 2024-01-05 DIAGNOSIS — I872 Venous insufficiency (chronic) (peripheral): Secondary | ICD-10-CM | POA: Insufficient documentation

## 2024-01-05 DIAGNOSIS — Z125 Encounter for screening for malignant neoplasm of prostate: Secondary | ICD-10-CM | POA: Diagnosis not present

## 2024-01-05 DIAGNOSIS — Z87898 Personal history of other specified conditions: Secondary | ICD-10-CM | POA: Insufficient documentation

## 2024-01-05 DIAGNOSIS — L821 Other seborrheic keratosis: Secondary | ICD-10-CM | POA: Insufficient documentation

## 2024-01-05 LAB — PSA, MEDICARE: PSA: 1.28 ng/mL (ref 0.10–4.00)

## 2024-01-05 NOTE — Assessment & Plan Note (Signed)
 Pt's PE unchanged from previous and WNL w/ exception of mechanical valve.  UTD on colonoscopy, PNA.  Check labs.  Anticipatory guidance provided.

## 2024-01-05 NOTE — Patient Instructions (Signed)
Follow up in 3-4 months to recheck sugar We'll notify you of your lab results and make any changes if needed Keep up the good work on healthy diet and regular exercise- you look great! Call with any questions or concerns Stay Safe!  Stay Healthy! Have a great summer!

## 2024-01-05 NOTE — Assessment & Plan Note (Signed)
Chronic problem.  UTD on eye exam, foot exam, microalbumin.  Check labs.  Adjust meds prn  

## 2024-01-05 NOTE — Progress Notes (Signed)
   Subjective:    Patient ID: [name] [name], male    DOB: 03-31-1951, 73 y.o.   MRN: [id number]  HPI CPE- UTD on foot exam, eye exam, microalbumin.  UTD on colonoscopy, PNA  Patient Care Team    Relationship Specialty Notifications Start End  Jess Morita, MD PCP - General   07/16/10    Comment: Londell River, MD PCP - Cardiology Cardiology  01/19/22   Darlis Eisenmenger, MD Consulting Physician Cardiology  03/14/15   Norita Beauvais, MD (Inactive) Consulting Physician Cardiothoracic Surgery  06/03/15   Ann Barnacle, MD (Inactive) Consulting Physician Urology  06/03/15   Otis Blocker, MD Referring Physician Dermatology  06/10/17   Vernadine Golas, MD Consulting Physician Otolaryngology  06/10/17      Health Maintenance  Topic Date Due   DTaP/Tdap/Td (2 - Td or Tdap) 07/12/2022   Medicare Annual Wellness (AWV)  09/10/2023   HEMOGLOBIN A1C  01/13/2024   INFLUENZA VACCINE  03/03/2024   Diabetic kidney evaluation - Urine ACR  04/14/2024   FOOT EXAM  04/14/2024   OPHTHALMOLOGY EXAM  04/28/2024   Colonoscopy  05/14/2024   Diabetic kidney evaluation - eGFR measurement  09/09/2024   Pneumonia Vaccine 1+ Years old  Completed   Hepatitis C Screening  Completed   Zoster Vaccines- Shingrix  Completed   HPV VACCINES  Aged Out   Meningococcal B Vaccine  Aged Out   COVID-19 Vaccine  Discontinued      Review of Systems Patient reports no vision/hearing changes, anorexia, fever ,adenopathy, persistant/recurrent hoarseness, chest pain, palpitations, edema, persistant/recurrent cough, hemoptysis, dyspnea (rest,exertional, paroxysmal nocturnal), gastrointestinal  bleeding (melena, rectal bleeding), abdominal pain, excessive heart burn, GU symptoms (dysuria, hematuria, voiding/incontinence issues) syncope, focal weakness, memory loss, numbness & tingling, skin/hair/nail changes, depression, anxiety, abnormal bruising/bleeding, musculoskeletal symptoms/signs.   + 5 lb  weight loss- following wife's new diet + dysphagia- ongoing issue    Objective:   Physical Exam General Appearance:    Alert, cooperative, no distress, appears stated age  Head:    Normocephalic, without obvious abnormality, atraumatic  Eyes:    PERRL, conjunctiva/corneas clear, EOM's intact both eyes       Ears:    Normal TM's and external ear canals, both ears  Nose:   Nares normal, septum midline, mucosa normal, no drainage   or sinus tenderness  Throat:   Lips, mucosa, and tongue normal; teeth and gums normal  Neck:   Supple, symmetrical, trachea midline, no adenopathy;       thyroid :  No enlargement/tenderness/nodules  Back:     Symmetric, no curvature, ROM normal, no CVA tenderness  Lungs:     Clear to auscultation bilaterally, respirations unlabored  Chest wall:    No tenderness or deformity  Heart:    Regular rate and rhythm, S1 and S2 normal, mechanical valve click  Abdomen:     Soft, non-tender, bowel sounds active all four quadrants,    no masses, no organomegaly  Genitalia:    deferred  Rectal:    Extremities:   Extremities normal, atraumatic, no cyanosis or edema  Pulses:   2+ and symmetric all extremities  Skin:   Skin color, texture, turgor normal, no rashes or lesions  Lymph nodes:   Cervical, supraclavicular, and axillary nodes normal  Neurologic:   CNII-XII intact. Normal strength, sensation and reflexes      throughout          Assessment & Plan:

## 2024-01-06 ENCOUNTER — Other Ambulatory Visit (INDEPENDENT_AMBULATORY_CARE_PROVIDER_SITE_OTHER)

## 2024-01-06 DIAGNOSIS — E118 Type 2 diabetes mellitus with unspecified complications: Secondary | ICD-10-CM | POA: Diagnosis not present

## 2024-01-06 LAB — LIPID PANEL
Cholesterol: 145 mg/dL (ref 0–200)
HDL: 50.8 mg/dL (ref >39.00)
LDL Cholesterol: 66 mg/dL (ref 0–99)
NonHDL: 94.29
Total CHOL/HDL Ratio: 3
Triglycerides: 143 mg/dL (ref 0.0–149.0)
VLDL: 28.6 mg/dL (ref 0.0–40.0)

## 2024-01-06 LAB — HEPATIC FUNCTION PANEL
ALT: 23 U/L (ref 0–53)
AST: 24 U/L (ref 0–37)
Albumin: 4.4 g/dL (ref 3.5–5.2)
Alkaline Phosphatase: 66 U/L (ref 39–117)
Bilirubin, Direct: 0.3 mg/dL (ref 0.0–0.3)
Total Bilirubin: 1.6 mg/dL — ABNORMAL HIGH (ref 0.2–1.2)
Total Protein: 7.1 g/dL (ref 6.0–8.3)

## 2024-01-06 LAB — CBC WITH DIFFERENTIAL/PLATELET
Basophils Absolute: 0 10*3/uL (ref 0.0–0.1)
Basophils Relative: 0.2 % (ref 0.0–3.0)
Eosinophils Absolute: 0.1 10*3/uL (ref 0.0–0.7)
Eosinophils Relative: 1.3 % (ref 0.0–5.0)
HCT: 46.3 % (ref 39.0–52.0)
Hemoglobin: 15.2 g/dL (ref 13.0–17.0)
Lymphocytes Relative: 24.5 % (ref 12.0–46.0)
Lymphs Abs: 1.9 10*3/uL (ref 0.7–4.0)
MCHC: 32.9 g/dL (ref 30.0–36.0)
MCV: 91.1 fl (ref 78.0–100.0)
Monocytes Absolute: 0.8 10*3/uL (ref 0.1–1.0)
Monocytes Relative: 9.7 % (ref 3.0–12.0)
Neutro Abs: 5.1 10*3/uL (ref 1.4–7.7)
Neutrophils Relative %: 64.3 % (ref 43.0–77.0)
Platelets: 242 10*3/uL (ref 150.0–400.0)
RBC: 5.08 Mil/uL (ref 4.22–5.81)
RDW: 13.8 % (ref 11.5–15.5)
WBC: 7.9 10*3/uL (ref 4.0–10.5)

## 2024-01-06 LAB — HEMOGLOBIN A1C: Hgb A1c MFr Bld: 7.8 % — ABNORMAL HIGH (ref 4.6–6.5)

## 2024-01-06 LAB — BASIC METABOLIC PANEL WITH GFR
BUN: 18 mg/dL (ref 6–23)
CO2: 21 meq/L (ref 19–32)
Calcium: 9.3 mg/dL (ref 8.4–10.5)
Chloride: 102 meq/L (ref 96–112)
Creatinine, Ser: 0.8 mg/dL (ref 0.40–1.50)
GFR: 89.06 mL/min (ref >60.00)
Glucose, Bld: 209 mg/dL — ABNORMAL HIGH (ref 70–99)
Potassium: 4.7 meq/L (ref 3.5–5.1)
Sodium: 136 meq/L (ref 135–145)

## 2024-01-06 LAB — TSH: TSH: 2.5 u[IU]/mL (ref 0.35–5.50)

## 2024-01-07 ENCOUNTER — Ambulatory Visit: Attending: Family Medicine

## 2024-01-07 ENCOUNTER — Encounter: Payer: Self-pay | Admitting: Family Medicine

## 2024-01-07 ENCOUNTER — Other Ambulatory Visit: Payer: Self-pay

## 2024-01-07 DIAGNOSIS — M5136 Other intervertebral disc degeneration, lumbar region with discogenic back pain only: Secondary | ICD-10-CM

## 2024-01-07 DIAGNOSIS — M6281 Muscle weakness (generalized): Secondary | ICD-10-CM | POA: Insufficient documentation

## 2024-01-07 DIAGNOSIS — R293 Abnormal posture: Secondary | ICD-10-CM | POA: Insufficient documentation

## 2024-01-07 NOTE — Telephone Encounter (Signed)
 Patient states he has been working with PT on his neck and is wondering if a referral could be placed to work with his lower back as well?  Patient also states he notice from lab work that his A1C is still high and he does have metformin  capsules left from previous, he is questioning if he should start this back with Jardiance ?

## 2024-01-07 NOTE — Therapy (Signed)
 OUTPATIENT PHYSICAL THERAPY CERVICAL EVALUATION   Patient Name: [name] [name] MRN: [id number] DOB:1951-04-07, 73 y.o., male Today's Date: 01/07/2024  END OF SESSION:  PT End of Session - 01/07/24 0955     Visit Number 1    Number of Visits 17    Date for PT Re-Evaluation 03/03/24    Authorization Type [employer]    PT Start Time 1000    PT Stop Time 1039    PT Time Calculation (min) 39 min    Activity Tolerance Patient tolerated treatment well    Behavior During Therapy WFL for tasks assessed/performed             Past Medical History:  Diagnosis Date   Acute gastritis    Aortic insufficiency    s/p Ross Procedure: AVR/Pulmonary autograft in aortic position & PVR with bioprosthetic PV 1994 in [place]   Aortic root aneurysm    Atrial flutter (HCC)    history- no atrial flutter since cardioversion   CHF (congestive heart failure) (HCC)    Diabetes mellitus without complication (HCC)    type 2   Early satiety    Esophageal stricture    GERD (gastroesophageal reflux disease)    Headache syndrome 10/18/2018   Hiatal hernia    Hyperlipidemia    Neuropathy    left brachial plexus   OSA on CPAP    mild obstructive sleep apnea overall but moderate during REM sleep with REM AHI 16.6/h and overall AHI 6.6/h.  In auto CPAP from 4 to 15 cm H2O   Pain, dental    SCC (squamous cell carcinoma) 02/10/2023   right forearm   Past Surgical History:  Procedure Laterality Date   AORTIC VALVE REPLACEMENT  1994   CARDIOVERSION N/A 03/14/2020   Procedure: CARDIOVERSION;  Surgeon: Darlis Eisenmenger, MD;  Location: [facility] ENDOSCOPY;  Service: Cardiovascular;  Laterality: N/A;   ESOPHAGEAL MANOMETRY N/A 09/23/2022   Procedure: ESOPHAGEAL MANOMETRY (EM);  Surgeon: Sergio Dandy, MD;  Location: [facility] ENDOSCOPY;  Service: Gastroenterology;  Laterality: N/A;   ESOPHAGOGASTRODUODENOSCOPY N/A 03/30/2022   Procedure: ESOPHAGOGASTRODUODENOSCOPY (EGD);  Surgeon: Hilarie Lovely,  MD;  Location: [facility] OR;  Service: Thoracic;  Laterality: N/A;   GANGLION CYST EXCISION  JAN 2002   HIATAL HERNIA REPAIR  DEC 2002   INTERCOSTAL NERVE BLOCK Right 03/30/2022   Procedure: INTERCOSTAL NERVE BLOCK;  Surgeon: Hilarie Lovely, MD;  Location: [facility] OR;  Service: Thoracic;  Laterality: Right;   TEE WITHOUT CARDIOVERSION N/A 03/14/2020   Procedure: TRANSESOPHAGEAL ECHOCARDIOGRAM (TEE);  Surgeon: Darlis Eisenmenger, MD;  Location: [facility] ENDOSCOPY;  Service: Cardiovascular;  Laterality: N/A;   TONSILLECTOMY     VASECTOMY  DEC 2002   Patient Active Problem List   Diagnosis Date Noted   Benign neoplasm of skin of lower limb 01/05/2024   History of neoplasm 01/05/2024   Lentigo 01/05/2024   Neurogenic claudication 01/05/2024   Peripheral venous insufficiency 01/05/2024   Seborrheic keratosis 01/05/2024   [name] 01/06/2023   Dysphagia 10/29/2022   Refractory chronic cough 07/22/2022   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   Aortic atherosclerosis (HCC) 01/19/2022   Other allergic rhinitis 12/31/2021   Degenerative disc disease, lumbar 04/04/2021   Abdominal bloating 12/14/2019   Controlled diabetes mellitus type 2 with complications (HCC) 12/14/2019   Headache syndrome 10/18/2018   Laryngopharyngeal reflux (LPR) 08/10/2016   BPH (benign prostatic hyperplasia) 06/03/2015   H/O Ross procedure 01/31/2015   Thoracic aortic aneurysm (HCC) 12/11/2013   Ganglion  cyst 06/01/2013   Brachial plexus neuropathy 10/07/2011   Cervical disc disease 10/07/2011   General medical examination 03/11/2011   Carpopedal spasm 02/25/2011   Arthritis of hand 02/25/2011   CARPAL TUNNEL SYNDROME, BILATERAL 06/10/2009   AORTIC VALVE REPLACEMENT, HX OF 06/10/2009   Hyperlipidemia 08/08/2008   Essential hypertension 08/08/2008   ESOPHAGEAL STRICTURE 03/29/2003   GERD 03/29/2003   HIATAL HERNIA 10/21/2000    PCP:  Jess Morita, MD   REFERRING PROVIDER:  Jess Morita, MD    REFERRING DIAG:  [phone] (ICD-10-CM) - Cervical spondylosis with radiculopathy    THERAPY DIAG:  Abnormal posture  Muscle weakness (generalized)  Rationale for Evaluation and Treatment: Rehabilitation  ONSET DATE: 2 years   SUBJECTIVE:                                                                                                                                                                                                         SUBJECTIVE STATEMENT:  Patient reports to PT with primary concern of stiffness in the neck. He states that he is not having any pain. He did have headaches that were alleviated with use of CPAP. He reports history of L brachial plexus injury that has significantly decreased his shoulder mobility.  Hand dominance: Right  PERTINENT HISTORY:  Relevant PMHx includes aortic insuffiency with Ross procedure, CHF, DM type II, esophageal stricture (with reported dysphagia), Left brachial plexus neuropathy, OSA, neurogenic claudication, BIL carpal tunnel   PAIN:  Are you having pain?  No  PRECAUTIONS: None  RED FLAGS: None     WEIGHT BEARING RESTRICTIONS: No  FALLS:  Has patient fallen in last 6 months? No  LIVING ENVIRONMENT: Lives with: lives with their spouse Lives in: House/apartment Stairs: yes, indoor and outdoor with railings  Has following equipment at home: None  OCCUPATION: Retired  PLOF: Independent and Leisure: Yardwork  PATIENT GOALS: To be able to turn my neck, especially in traffic.   NEXT MD VISIT: 04/06/2024 to referring provider   OBJECTIVE:  Note: Objective measures were completed at Evaluation unless otherwise noted.  DIAGNOSTIC FINDINGS:   11/03/2023 MRI Cervical Spine   IMPRESSION: 1. C2-3: Mild bilateral foraminal narrowing, not compressive. 2. C3-4: Bilateral foraminal stenosis left worse than right. Either C4 nerve could be affected, more likely the left. 3. C4-5: Bilateral foraminal stenosis that could affect  either or both C5 nerves. 4. C5-6: Bilateral foraminal stenosis that could affect either or both C6 nerves. 5. C6-7: Bilateral foraminal stenosis that could affect either or both C7 nerves. 6. Canal  stenosis most notable at C3-4 and C4-5 but without gross compressive effect upon the spinal cord.    PATIENT SURVEYS:  NDI 8/50 LEFS 18/80  COGNITION: Overall cognitive status: Within functional limits for tasks assessed  SENSATION: Not tested  POSTURE: rounded shoulders, forward head, and increased thoracic kyphosis  PALPATION: No tenderness to palpation    CERVICAL ROM:   Active ROM A/PROM (deg) eval  Flexion 30  Extension 10  Right lateral flexion 30  Left lateral flexion 30  Right rotation 35  Left rotation 35   (Blank rows = not tested)  UPPER EXTREMITY ROM:  Active ROM Right eval Left eval  Shoulder flexion WFL   Shoulder extension    Shoulder abduction WFL    Shoulder adduction    Shoulder extension    Shoulder internal rotation    Shoulder external rotation    Elbow flexion    Elbow extension    Wrist flexion    Wrist extension    Wrist ulnar deviation    Wrist radial deviation    Wrist pronation    Wrist supination     (Blank rows = not tested)  UPPER EXTREMITY MMT:  MMT Right eval Left eval  Shoulder flexion    Shoulder extension    Shoulder abduction    Shoulder adduction    Shoulder extension    Shoulder internal rotation    Shoulder external rotation    Middle trapezius    Lower trapezius    Elbow flexion    Elbow extension    Wrist flexion    Wrist extension    Wrist ulnar deviation    Wrist radial deviation    Wrist pronation    Wrist supination    Grip strength     (Blank rows = not tested)  CERVICAL SPECIAL TESTS:  Deferred for future visits as indicated  FUNCTIONAL TESTS:  Deferred for future visits as indicated     TREATMENT DATE:   [facility] Adult PT Treatment:                                                DATE:  01/07/2024   Initial evaluation: see patient education and home exercise program as noted below   Cervical Retraction, 3 sec hold x 10  Scapular Retraction, 3 sec hold x 10                                                                                                                                   PATIENT EDUCATION:  Education details: reviewed initial home exercise program; discussion of POC, prognosis and goals for skilled PT   Person educated: Patient Education method: Explanation, Demonstration, and Handouts Education comprehension: verbalized understanding, returned demonstration, and needs further education  HOME EXERCISE PROGRAM: Access Code: [email or web address]  URL: [email or web address] Date: 01/07/2024 Prepared by: Arlester Bence  Exercises - Seated Cervical Retraction Protraction AROM  - 1 x daily - 7 x weekly - 2 sets - 10 reps - 3 sec hold - Seated Scapular Retraction  - 1 x daily - 7 x weekly - 2 sets - 10 reps - 3 sec hold - Seated Cervical Extension AROM  - 1 x daily - 7 x weekly - 2 sets - 10 reps - Supine Cervical Rotation AROM on Pillow  - 1 x daily - 7 x weekly - 1 sets - 15 reps  ASSESSMENT:  CLINICAL IMPRESSION: Leandro is a 73 y.o. male who was seen today for physical therapy evaluation and treatment for decreased Cervical Mobility related to L>R foraminal cervical stenosis from C2-C7. He is demonstrating decreased CS AROM and decreased postural endurance. His cervical rotation AROM improved from 35 degrees to 40 degrees bilaterally following cervical retraction today. He has related pain and difficulty with lifting, reading, driving and participation in recreational activities. He requires skilled PT services at this time to address relevant deficits and improve overall function.     OBJECTIVE IMPAIRMENTS: decreased ROM, impaired UE functional use, improper body mechanics, and postural dysfunction.   ACTIVITY LIMITATIONS: carrying and  lifting  PARTICIPATION LIMITATIONS: driving, shopping, community activity, and yard work  PERSONAL FACTORS: Age, Past/current experiences, and 3+ comorbidities: Relevant PMHx includes aortic insuffiency with Ross procedure, CHF, DM type II, esophageal stricture (with reported dysphagia), Left brachial plexus neuropathy, OSA, neurogenic claudication, BIL carpal tunnel  are also affecting patient's functional outcome.   REHAB POTENTIAL: Fair    CLINICAL DECISION MAKING: Stable/uncomplicated  EVALUATION COMPLEXITY: Low   GOALS: Goals reviewed with patient? YES  SHORT TERM GOALS: Target date: 02/04/2024   Patient will be independent with initial home program at least 3 days/week.  Baseline: provided at eval Goal Status: INITIAL   2.  Patient will demonstrate improved postural awareness for at least 15 minutes while seated without need for cueing from PT.  Baseline: see objective measures Goal Status: INITIAL   3.  Patient will demonstrate improved CS AROM by at least 5 degrees in all directions  Baseline: see objective measures Goal status: INITIAL   LONG TERM GOALS: Target date: 03/03/2024   Patient will report improved overall functional ability with NDI score of 4/50 or less.  Baseline: 8/50 Goal Status: INITIAL    2.  Patient will report ability to turn his head while driving, with less reliance on trunk rotation for compensation.  Baseline: using trunk rotation d/t 35 degree active rotation  Goal status: INITIAL  3.  Patient will demonstrate improved CS AROM by at least 10 degrees in all directions  Baseline: see objective measures Goal status: INITIAL      PLAN:  PT FREQUENCY: 1-2x/week  PT DURATION: 8 weeks  PLANNED INTERVENTIONS: 97164- PT Re-evaluation, 97110-Therapeutic exercises, 97530- Therapeutic activity, 97112- Neuromuscular re-education, 97535- Self Care, [id number]- Manual therapy, [id number]- Electrical stimulation (unattended), 36644- Traction (mechanical),  97033- Ionotophoresis 4mg /ml Dexamethasone , Patient/Family education, Taping, Joint mobilization, Spinal manipulation, Spinal mobilization, Cryotherapy, and Moist heat  PLAN FOR NEXT SESSION: anticipating need for assessment of lumbar pain at future visit to include in current POC (patient is planning to ask PCP for PT referral). Continue to address CS mobility, updated Hep as indicated. Use of manual therapy and modalities as indicated    Arlester Bence, PT, DPT  01/07/2024 11:54 AM

## 2024-01-09 ENCOUNTER — Ambulatory Visit: Payer: Self-pay | Admitting: Family Medicine

## 2024-01-15 DIAGNOSIS — G4733 Obstructive sleep apnea (adult) (pediatric): Secondary | ICD-10-CM | POA: Diagnosis not present

## 2024-01-17 ENCOUNTER — Ambulatory Visit: Payer: Self-pay | Admitting: Physical Therapy

## 2024-01-17 ENCOUNTER — Encounter: Payer: Self-pay | Admitting: Physical Therapy

## 2024-01-17 DIAGNOSIS — G4733 Obstructive sleep apnea (adult) (pediatric): Secondary | ICD-10-CM | POA: Diagnosis not present

## 2024-01-17 DIAGNOSIS — R293 Abnormal posture: Secondary | ICD-10-CM

## 2024-01-17 DIAGNOSIS — M6281 Muscle weakness (generalized): Secondary | ICD-10-CM

## 2024-01-17 DIAGNOSIS — I10 Essential (primary) hypertension: Secondary | ICD-10-CM | POA: Diagnosis not present

## 2024-01-17 NOTE — Therapy (Signed)
 OUTPATIENT PHYSICAL THERAPY RE-ASSESSMENT + RECERTIFICATION   Patient Name: [name] [name] MRN: [id number] DOB:Sep 26, 1950, 73 y.o., male Today's Date: 01/17/2024  END OF SESSION:  PT End of Session - 01/17/24 0958     Visit Number 2    Number of Visits 17    Date for PT Re-Evaluation 03/03/24    Authorization Type [employer]    PT Start Time 1001    PT Stop Time 1043    PT Time Calculation (min) 42 min    Activity Tolerance Patient tolerated treatment well           Past Medical History:  Diagnosis Date   Acute gastritis    Aortic insufficiency    s/p Ross Procedure: AVR/Pulmonary autograft in aortic position & PVR with bioprosthetic PV 1994 in [place]   Aortic root aneurysm    Atrial flutter (HCC)    history- no atrial flutter since cardioversion   CHF (congestive heart failure) (HCC)    Diabetes mellitus without complication (HCC)    type 2   Early satiety    Esophageal stricture    GERD (gastroesophageal reflux disease)    Headache syndrome 10/18/2018   Hiatal hernia    Hyperlipidemia    Neuropathy    left brachial plexus   OSA on CPAP    mild obstructive sleep apnea overall but moderate during REM sleep with REM AHI 16.6/h and overall AHI 6.6/h.  In auto CPAP from 4 to 15 cm H2O   Pain, dental    SCC (squamous cell carcinoma) 02/10/2023   right forearm   Past Surgical History:  Procedure Laterality Date   AORTIC VALVE REPLACEMENT  1994   CARDIOVERSION N/A 03/14/2020   Procedure: CARDIOVERSION;  Surgeon: Darlis Eisenmenger, MD;  Location: [facility] ENDOSCOPY;  Service: Cardiovascular;  Laterality: N/A;   ESOPHAGEAL MANOMETRY N/A 09/23/2022   Procedure: ESOPHAGEAL MANOMETRY (EM);  Surgeon: Sergio Dandy, MD;  Location: [facility] ENDOSCOPY;  Service: Gastroenterology;  Laterality: N/A;   ESOPHAGOGASTRODUODENOSCOPY N/A 03/30/2022   Procedure: ESOPHAGOGASTRODUODENOSCOPY (EGD);  Surgeon: Hilarie Lovely, MD;  Location: [facility] OR;  Service: Thoracic;   Laterality: N/A;   GANGLION CYST EXCISION  JAN 2002   HIATAL HERNIA REPAIR  DEC 2002   INTERCOSTAL NERVE BLOCK Right 03/30/2022   Procedure: INTERCOSTAL NERVE BLOCK;  Surgeon: Hilarie Lovely, MD;  Location: [facility] OR;  Service: Thoracic;  Laterality: Right;   TEE WITHOUT CARDIOVERSION N/A 03/14/2020   Procedure: TRANSESOPHAGEAL ECHOCARDIOGRAM (TEE);  Surgeon: Darlis Eisenmenger, MD;  Location[facility] ENDOSCOPY;  Service: Cardiovascular;  Laterality: N/A;   TONSILLECTOMY     VASECTOMY  DEC 2002   Patient Active Problem List   Diagnosis Date Noted   Benign neoplasm of skin of lower limb 01/05/2024   History of neoplasm 01/05/2024   Lentigo 01/05/2024   Neurogenic claudication 01/05/2024   Peripheral venous insufficiency 01/05/2024   Seborrheic keratosis 01/05/2024   [name] 01/06/2023   Dysphagia 10/29/2022   Refractory chronic cough 07/22/2022   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   Aortic atherosclerosis (HCC) 01/19/2022   Other allergic rhinitis 12/31/2021   Degenerative disc disease, lumbar 04/04/2021   Abdominal bloating 12/14/2019   Controlled diabetes mellitus type 2 with complications (HCC) 12/14/2019   Headache syndrome 10/18/2018   Laryngopharyngeal reflux (LPR) 08/10/2016   BPH (benign prostatic hyperplasia) 06/03/2015   H/O Ross procedure 01/31/2015   Thoracic aortic aneurysm (HCC) 12/11/2013   Ganglion cyst 06/01/2013   Brachial plexus neuropathy 10/07/2011   Cervical  disc disease 10/07/2011   General medical examination 03/11/2011   Carpopedal spasm 02/25/2011   Arthritis of hand 02/25/2011   CARPAL TUNNEL SYNDROME, BILATERAL 06/10/2009   AORTIC VALVE REPLACEMENT, HX OF 06/10/2009   Hyperlipidemia 08/08/2008   Essential hypertension 08/08/2008   ESOPHAGEAL STRICTURE 03/29/2003   GERD 03/29/2003   HIATAL HERNIA 10/21/2000    PCP:  Jess Morita, MD   REFERRING PROVIDER:  Jess Morita, MD   REFERRING DIAG:  [phone] (ICD-10-CM) -  Cervical spondylosis with radiculopathy   New referral from PCP 01/09/24:  M51.360 (ICD-10-CM) - Degeneration of intervertebral disc of lumbar region with discogenic back pain  THERAPY DIAG:  Abnormal posture  Muscle weakness (generalized)  Rationale for Evaluation and Treatment: Rehabilitation  ONSET DATE: 2 years   SUBJECTIVE:                                                                                                                                                                                                        Per eval - Patient reports to PT with primary concern of stiffness in the neck. He states that he is not having any pain. He did have headaches that were alleviated with use of CPAP. He reports history of L brachial plexus injury that has significantly decreased his shoulder mobility.  Hand dominance: Right  SUBJECTIVE STATEMENT:  01/17/2024: Pt states back is 90% of the problem. Has been doing neck exercises and feels it has helped with the stiffness, feels less noticeable.  States back has been bothering him all my life. States as a 58-56 year old fell off a horse and he wasn't able to move  for a couple days, isn't sure if that contributed to pain. States his pain is more of a weakness, low central>left, top of hip. States sometimes it will go into LLE but this will be resolved by orthotics in shoes, very infrequent. Tends to run posterolateral into calf. Has trouble balancing on L. Used to enjoy golfing but hasn't played in a couple years due to weakness/stiffness. Denies any recent acute worsening. States he has had imaging but it's been a while. Standing/sitting with stooped posture 15-20 min before needing a break.   PERTINENT HISTORY:  Relevant PMHx includes aortic insuffiency with Ross procedure, CHF, DM type II, esophageal stricture (with reported dysphagia), Left brachial plexus neuropathy, OSA, neurogenic claudication, BIL carpal tunnel   PAIN:  Are  you having pain?  No  PRECAUTIONS: None  RED FLAGS: None  01/17/24: no BIL LE symptoms, no buckling, no saddle anesthesia, no bowel/bladder  WEIGHT BEARING RESTRICTIONS: No  FALLS:  Has patient fallen in last 6 months? No  LIVING ENVIRONMENT: Lives with: lives with their spouse Lives in: House/apartment Stairs: yes, indoor and outdoor with railings  Has following equipment at home: None  OCCUPATION: Retired  PLOF: Independent and Leisure: Yardwork  PATIENT GOALS: To be able to turn my neck, especially in traffic.  01/17/24: try to get back to golfing, lower body dressing, transfers after sitting  NEXT MD VISIT: 04/06/2024 to referring provider   OBJECTIVE:  Note: Objective measures were completed at Evaluation unless otherwise noted.  DIAGNOSTIC FINDINGS:   11/03/2023 MRI Cervical Spine   IMPRESSION: 1. C2-3: Mild bilateral foraminal narrowing, not compressive. 2. C3-4: Bilateral foraminal stenosis left worse than right. Either C4 nerve could be affected, more likely the left. 3. C4-5: Bilateral foraminal stenosis that could affect either or both C5 nerves. 4. C5-6: Bilateral foraminal stenosis that could affect either or both C6 nerves. 5. C6-7: Bilateral foraminal stenosis that could affect either or both C7 nerves. 6. Canal stenosis most notable at C3-4 and C4-5 but without gross compressive effect upon the spinal cord.    PATIENT SURVEYS:  NDI 8/50 LEFS 18/80  COGNITION: Overall cognitive status: Within functional limits for tasks assessed  SENSATION: Not tested  POSTURE: rounded shoulders, forward head, and increased thoracic kyphosis  PALPATION: No tenderness to palpation    CERVICAL ROM:   Active ROM A/PROM (deg) eval  Flexion 30  Extension 10  Right lateral flexion 30  Left lateral flexion 30  Right rotation 35  Left rotation 35   (Blank rows = not tested)  UPPER EXTREMITY ROM:  Active ROM Right eval Left eval  Shoulder flexion  WFL   Shoulder extension    Shoulder abduction WFL    Shoulder adduction    Shoulder extension    Shoulder internal rotation    Shoulder external rotation    Elbow flexion    Elbow extension    Wrist flexion    Wrist extension    Wrist ulnar deviation    Wrist radial deviation    Wrist pronation    Wrist supination     (Blank rows = not tested)  UPPER EXTREMITY MMT:  MMT Right eval Left eval  Shoulder flexion    Shoulder extension    Shoulder abduction    Shoulder adduction    Shoulder extension    Shoulder internal rotation    Shoulder external rotation    Middle trapezius    Lower trapezius    Elbow flexion    Elbow extension    Wrist flexion    Wrist extension    Wrist ulnar deviation    Wrist radial deviation    Wrist pronation    Wrist supination    Grip strength     (Blank rows = not tested)  CERVICAL SPECIAL TESTS:  Deferred for future visits as indicated  FUNCTIONAL TESTS:  Deferred for future visits as indicated    01/17/24 LUMBAR EXAM:  LUMBAR ROM:    A/PROM  01/17/24  Flexion Distal shin w/ stiffness, hamstring tightness  Extension 25% s  Right lateral flexion Just above knee, s  Left lateral flexion Mid thigh, s  Right rotation 25% s  Left rotation 25% s   (Blank rows = not tested) (Key: WFL = within functional limits not formally assessed, * = concordant pain, s = stiffness/stretching sensation, NT = not tested) Comments:    LOWER EXTREMITY MMT:  MMT Right 01/17/24 Left 01/17/24  Hip flexion 4 4  Hip abduction (modified sitting) 5 5  Hip internal rotation 4+ 4  Hip external rotation 4+ 4  Knee flexion 5 4  Knee extension 5 5  Ankle dorsiflexion 5 5   (Blank rows = not tested) (Key: WFL = within functional limits not formally assessed, * = concordant pain, s = stiffness/stretching sensation, NT = not tested)  Comments:     FUNCTIONAL TEST: 5xSTS: 12.48 sec gentle UE support from thighs, provokes back  weakness    TREATMENT DATE:   [facility] Adult PT Treatment:                                                DATE: 01/17/24 Therapeutic Exercise: Passive wall lumbar extension x12 cues for comfortable ROM and control Seated adduction isometric unsupported x10 cues for posture, breath control Seated green band abduction x10 Seated green band SL abd x8 BIL HEP update + education/handout  Therapeutic Activity: MSK assessment + education 5xSTS + education Education/discussion re: low back symptoms, assessment findings, and PT goals/POC   [facility] Adult PT Treatment:                                                DATE: 01/07/2024   Initial evaluation: see patient education and home exercise program as noted below   Cervical Retraction, 3 sec hold x 10  Scapular Retraction, 3 sec hold x 10                                                                                                                                   PATIENT EDUCATION:  Education details: rationale for interventions, HEP  Person educated: Patient Education method: Explanation, Demonstration, Tactile cues, Verbal cues Education comprehension: verbalized understanding, returned demonstration, verbal cues required, tactile cues required, and needs further education     HOME EXERCISE PROGRAM: Access Code: [email or web address] ([email or web address]) URL: [email or web address] Date: 01/07/2024 Prepared by: Arlester Bence  Exercises - Seated Cervical Retraction Protraction AROM  - 1 x daily - 7 x weekly - 2 sets - 10 reps - 3 sec hold - Seated Scapular Retraction  - 1 x daily - 7 x weekly - 2 sets - 10 reps - 3 sec hold - Seated Cervical Extension AROM  - 1 x daily - 7 x weekly - 2 sets - 10 reps - Supine Cervical Rotation AROM on Pillow  - 1 x daily - 7 x weekly - 1 sets - 15 reps   Access Code: [email or web address]) URL: [email or web address] Date: 01/17/2024 Prepared by: Mayme Spearman  Exercises - Seated Hip  Adduction Isometrics with Celeste Cola  -  2-3 x daily - 1 sets - 8 reps - Seated Hip Abduction with Resistance  - 2-3 x daily - 1 sets - 8 reps - Seated Trunk Rotation - Arms Crossed  - 2-3 x daily - 1 sets - 8 reps  ASSESSMENT:  CLINICAL IMPRESSION: 01/17/2024: Pt arrives w/ report of improved cervical symptoms w/ HEP. Majority of session is spent discussing/assessing low back which has been a lifelong issue for him, denies any recent changes but states he has gradually had to reduce participation in recreational/health tasks. Also has increased difficulty w/ ADLs and housework. On exam, he demonstrates global deficits in lumbar ROM, mild concordant hip weakness. 5xSTS around cutoff score for fall risk in most populations. LTG are updated below to reflect incorporation of low back into POC. Tolerates HEP and exam well without adverse event or increase in pain. Recommend continuing along current POC in order to address relevant deficits and improve functional tolerance. Pt departs today's session in no acute distress, all voiced questions/concerns addressed appropriately from PT perspective.    Per eval - Aureliano is a 73 y.o. male who was seen today for physical therapy evaluation and treatment for decreased Cervical Mobility related to L>R foraminal cervical stenosis from C2-C7. He is demonstrating decreased CS AROM and decreased postural endurance. His cervical rotation AROM improved from 35 degrees to 40 degrees bilaterally following cervical retraction today. He has related pain and difficulty with lifting, reading, driving and participation in recreational activities. He requires skilled PT services at this time to address relevant deficits and improve overall function.     OBJECTIVE IMPAIRMENTS: decreased ROM, impaired UE functional use, improper body mechanics, and postural dysfunction.   ACTIVITY LIMITATIONS: carrying and lifting  PARTICIPATION LIMITATIONS: driving, shopping, community activity, and  yard work  PERSONAL FACTORS: Age, Past/current experiences, and 3+ comorbidities: Relevant PMHx includes aortic insuffiency with Ross procedure, CHF, DM type II, esophageal stricture (with reported dysphagia), Left brachial plexus neuropathy, OSA, neurogenic claudication, BIL carpal tunnel  are also affecting patient's functional outcome.   REHAB POTENTIAL: Fair    CLINICAL DECISION MAKING: Stable/uncomplicated  EVALUATION COMPLEXITY: Low   GOALS: Goals reviewed with patient? YES  SHORT TERM GOALS: Target date: 02/04/2024   Patient will be independent with initial home program at least 3 days/week.  Baseline: provided at eval Goal Status: INITIAL   2.  Patient will demonstrate improved postural awareness for at least 15 minutes while seated without need for cueing from PT.  Baseline: see objective measures Goal Status: INITIAL   3.  Patient will demonstrate improved CS AROM by at least 5 degrees in all directions  Baseline: see objective measures Goal status: INITIAL   LONG TERM GOALS: Target date: 03/03/2024   Patient will report improved overall functional ability with NDI score of 4/50 or less.  Baseline: 8/50 Goal Status: INITIAL    2.  Patient will report ability to turn his head while driving, with less reliance on trunk rotation for compensation.  Baseline: using trunk rotation d/t 35 degree active rotation  Goal status: INITIAL  3.  Patient will demonstrate improved CS AROM by at least 10 degrees in all directions  Baseline: see objective measures Goal status: INITIAL   4. Pt will perform 5x Sit to Stand in less than or equal to 9 sec without UE support in order to reduce fall risk and improve functional mobility.  Baseline: 12 sec, gentle UE support at thighs  Goal status: INITIAL/NEW 01/17/24  5. Pt will demonstrate  at least 75% lumbar AROM in all planes in order to facilitate improved functional mobility.  Baseline: see ROM chart above Goal status:  INITIAL/NEW 01/17/24  6. Pt will report ability to perform sitting/standing household tasks for >42min without increase in symptoms.  Baseline: reports 15-52min tolerance (dishes, workshop)  Goal status: INITIAL/New 01/17/24        PLAN:  PT FREQUENCY: 1-2x/week  PT DURATION: 8 weeks  PLANNED INTERVENTIONS: 97164- PT Re-evaluation, 97110-Therapeutic exercises, 97530- Therapeutic activity, 97112- Neuromuscular re-education, 97535- Self Care, 81191- Manual therapy, [id number]- Electrical stimulation (unattended), 47829- Traction (mechanical), [id number]- Ionotophoresis 4mg /ml Dexamethasone , Patient/Family education, Taping, Joint mobilization, Spinal manipulation, Spinal mobilization, Cryotherapy, and Moist heat  PLAN FOR NEXT SESSION: HEP review/update PRN. Continue working on global spinal mobility within pt tolerance, core stability. Pt wants to improve tolerance to stooped postures (both sitting and standing) and long term goal of working back into golfing. Use of manual therapy and modalities as indicated    Lovett Ruck PT, DPT 01/17/2024 1:36 PM

## 2024-01-19 ENCOUNTER — Ambulatory Visit: Admitting: Neurology

## 2024-01-20 NOTE — Therapy (Signed)
 OUTPATIENT PHYSICAL THERAPY TREATMENT   Patient Name: [name] [name] MRN: [id number] DOB:[birth year], 73 y.o., male Today's Date: 01/21/2024  END OF SESSION:  PT End of Session - 01/21/24 1045     Visit Number 3    Number of Visits 17    Date for PT Re-Evaluation 03/03/24    Authorization Type [employer]    PT Start Time 1045    PT Stop Time 1125    PT Time Calculation (min) 40 min    Activity Tolerance Patient tolerated treatment well            Past Medical History:  Diagnosis Date   Acute gastritis    Aortic insufficiency    s/p Ross Procedure: AVR/Pulmonary autograft in aortic position & PVR with bioprosthetic PV 1994 in [place]   Aortic root aneurysm    Atrial flutter (HCC)    history- no atrial flutter since cardioversion   CHF (congestive heart failure) (HCC)    Diabetes mellitus without complication (HCC)    type 2   Early satiety    Esophageal stricture    GERD (gastroesophageal reflux disease)    Headache syndrome 10/18/2018   Hiatal hernia    Hyperlipidemia    Neuropathy    left brachial plexus   OSA on CPAP    mild obstructive sleep apnea overall but moderate during REM sleep with REM AHI 16.6/h and overall AHI 6.6/h.  In auto CPAP from 4 to 15 cm H2O   Pain, dental    SCC (squamous cell carcinoma) 02/10/2023   right forearm   Past Surgical History:  Procedure Laterality Date   AORTIC VALVE REPLACEMENT  1994   CARDIOVERSION N/A 03/14/2020   Procedure: CARDIOVERSION;  Surgeon: Darlis Eisenmenger, MD;  Location: [facility] ENDOSCOPY;  Service: Cardiovascular;  Laterality: N/A;   ESOPHAGEAL MANOMETRY N/A 09/23/2022   Procedure: ESOPHAGEAL MANOMETRY (EM);  Surgeon: Sergio Dandy, MD;  Location: [facility] ENDOSCOPY;  Service: Gastroenterology;  Laterality: N/A;   ESOPHAGOGASTRODUODENOSCOPY N/A 03/30/2022   Procedure: ESOPHAGOGASTRODUODENOSCOPY (EGD);  Surgeon: Hilarie Lovely, MD;  Location: [facility] OR;  Service: Thoracic;  Laterality: N/A;   GANGLION  CYST EXCISION  JAN 2002   HIATAL HERNIA REPAIR  DEC 2002   INTERCOSTAL NERVE BLOCK Right 03/30/2022   Procedure: INTERCOSTAL NERVE BLOCK;  Surgeon: Hilarie Lovely, MD;  Location: [facility] OR;  Service: Thoracic;  Laterality: Right;   TEE WITHOUT CARDIOVERSION N/A 03/14/2020   Procedure: TRANSESOPHAGEAL ECHOCARDIOGRAM (TEE);  Surgeon: Darlis Eisenmenger, MD;  Location: [facility] ENDOSCOPY;  Service: Cardiovascular;  Laterality: N/A;   TONSILLECTOMY     VASECTOMY  DEC 2002   Patient Active Problem List   Diagnosis Date Noted   Benign neoplasm of skin of lower limb 01/05/2024   History of neoplasm 01/05/2024   Lentigo 01/05/2024   Neurogenic claudication 01/05/2024   Peripheral venous insufficiency 01/05/2024   Seborrheic keratosis 01/05/2024   [name] 01/06/2023   Dysphagia 10/29/2022   Refractory chronic cough 07/22/2022   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   Aortic atherosclerosis (HCC) 01/19/2022   Other allergic rhinitis 12/31/2021   Degenerative disc disease, lumbar 04/04/2021   Abdominal bloating 12/14/2019   Controlled diabetes mellitus type 2 with complications (HCC) 12/14/2019   Headache syndrome 10/18/2018   Laryngopharyngeal reflux (LPR) 08/10/2016   BPH (benign prostatic hyperplasia) 06/03/2015   H/O Ross procedure 01/31/2015   Thoracic aortic aneurysm (HCC) 12/11/2013   Ganglion cyst 06/01/2013   Brachial plexus neuropathy 10/07/2011   Cervical disc  disease 10/07/2011   General medical examination 03/11/2011   Carpopedal spasm 02/25/2011   Arthritis of hand 02/25/2011   CARPAL TUNNEL SYNDROME, BILATERAL 06/10/2009   AORTIC VALVE REPLACEMENT, HX OF 06/10/2009   Hyperlipidemia 08/08/2008   Essential hypertension 08/08/2008   ESOPHAGEAL STRICTURE 03/29/2003   GERD 03/29/2003   HIATAL HERNIA 10/21/2000    PCP:  Jess Morita, MD   REFERRING PROVIDER:  Jess Morita, MD   REFERRING DIAG:  [id number] (ICD-10-CM) - Cervical spondylosis with  radiculopathy   New referral from PCP 01/09/24:  M51.360 (ICD-10-CM) - Degeneration of intervertebral disc of lumbar region with discogenic back pain  THERAPY DIAG:  Abnormal posture  Muscle weakness (generalized)  Rationale for Evaluation and Treatment: Rehabilitation  ONSET DATE: 2 years   SUBJECTIVE:                                                                                                                                                                                                        Per eval - Patient reports to PT with primary concern of stiffness in the neck. He states that he is not having any pain. He did have headaches that were alleviated with use of CPAP. He reports history of L brachial plexus injury that has significantly decreased his shoulder mobility.  Back subjective 01/17/24: States back has been bothering him all my life. States as a 84-78 year old fell off a horse and he wasn't able to move  for a couple days, isn't sure if that contributed to pain. States his pain is more of a weakness, low central>left, top of hip. States sometimes it will go into LLE but this will be resolved by orthotics in shoes, very infrequent. Tends to run posterolateral into calf. Has trouble balancing on L. Used to enjoy golfing but hasn't played in a couple years due to weakness/stiffness. Denies any recent acute worsening. States he has had imaging but it's been a while. Standing/sitting with stooped posture 15-20 min before needing a break.   Hand dominance: Right  SUBJECTIVE STATEMENT:  01/21/2024: states his sciatic pain seems to be a bit more noticeable recently. Otherwise feeling about the same, neck still feeling better overall. Wants to work on floor transfers/lunging.    PERTINENT HISTORY:  Relevant PMHx includes aortic insuffiency with Ross procedure, CHF, DM type II, esophageal stricture (with reported dysphagia), Left brachial plexus neuropathy, OSA, neurogenic  claudication, BIL carpal tunnel   PAIN:  Are you having pain?  No  PRECAUTIONS: None  RED FLAGS: None  01/17/24: no BIL LE  symptoms, no buckling, no saddle anesthesia, no bowel/bladder    WEIGHT BEARING RESTRICTIONS: No  FALLS:  Has patient fallen in last 6 months? No  LIVING ENVIRONMENT: Lives with: lives with their spouse Lives in: House/apartment Stairs: yes, indoor and outdoor with railings  Has following equipment at home: None  OCCUPATION: Retired  PLOF: Independent and Leisure: Yardwork  PATIENT GOALS: To be able to turn my neck, especially in traffic.  01/17/24: try to get back to golfing, lower body dressing, transfers after sitting  NEXT MD VISIT: 04/06/2024 to referring provider   OBJECTIVE:  Note: Objective measures were completed at Evaluation unless otherwise noted.  DIAGNOSTIC FINDINGS:   11/03/2023 MRI Cervical Spine   IMPRESSION: 1. C2-3: Mild bilateral foraminal narrowing, not compressive. 2. C3-4: Bilateral foraminal stenosis left worse than right. Either C4 nerve could be affected, more likely the left. 3. C4-5: Bilateral foraminal stenosis that could affect either or both C5 nerves. 4. C5-6: Bilateral foraminal stenosis that could affect either or both C6 nerves. 5. C6-7: Bilateral foraminal stenosis that could affect either or both C7 nerves. 6. Canal stenosis most notable at C3-4 and C4-5 but without gross compressive effect upon the spinal cord.    PATIENT SURVEYS:  NDI 8/50 LEFS 18/80  COGNITION: Overall cognitive status: Within functional limits for tasks assessed  SENSATION: Not tested  POSTURE: rounded shoulders, forward head, and increased thoracic kyphosis  PALPATION: No tenderness to palpation    CERVICAL ROM:   Active ROM A/PROM (deg) eval  Flexion 30  Extension 10  Right lateral flexion 30  Left lateral flexion 30  Right rotation 35  Left rotation 35   (Blank rows = not tested)  UPPER EXTREMITY ROM:  Active  ROM Right eval Left eval  Shoulder flexion WFL   Shoulder extension    Shoulder abduction WFL    Shoulder adduction    Shoulder extension    Shoulder internal rotation    Shoulder external rotation    Elbow flexion    Elbow extension    Wrist flexion    Wrist extension    Wrist ulnar deviation    Wrist radial deviation    Wrist pronation    Wrist supination     (Blank rows = not tested)  UPPER EXTREMITY MMT:  MMT Right eval Left eval  Shoulder flexion    Shoulder extension    Shoulder abduction    Shoulder adduction    Shoulder extension    Shoulder internal rotation    Shoulder external rotation    Middle trapezius    Lower trapezius    Elbow flexion    Elbow extension    Wrist flexion    Wrist extension    Wrist ulnar deviation    Wrist radial deviation    Wrist pronation    Wrist supination    Grip strength     (Blank rows = not tested)  CERVICAL SPECIAL TESTS:  Deferred for future visits as indicated  FUNCTIONAL TESTS:  Deferred for future visits as indicated    01/17/24 LUMBAR EXAM:  LUMBAR ROM:    A/PROM  01/17/24  Flexion Distal shin w/ stiffness, hamstring tightness  Extension 25% s  Right lateral flexion Just above knee, s  Left lateral flexion Mid thigh, s  Right rotation 25% s  Left rotation 25% s   (Blank rows = not tested) (Key: WFL = within functional limits not formally assessed, * = concordant pain, s = stiffness/stretching sensation, NT = not  tested) Comments:    LOWER EXTREMITY MMT:    MMT Right 01/17/24 Left 01/17/24  Hip flexion 4 4  Hip abduction (modified sitting) 5 5  Hip internal rotation 4+ 4  Hip external rotation 4+ 4  Knee flexion 5 4  Knee extension 5 5  Ankle dorsiflexion 5 5   (Blank rows = not tested) (Key: WFL = within functional limits not formally assessed, * = concordant pain, s = stiffness/stretching sensation, NT = not tested)  Comments:     FUNCTIONAL TEST: 5xSTS: 12.48 sec gentle UE support  from thighs, provokes back weakness    TREATMENT DATE:   [facility] Adult PT Treatment:                                                DATE: 01/21/24 Therapeutic Exercise: Seated dynadisc cat/camel 2x8  Seated thoracolumbar ext x8  Standing hip extension x8  HEP update + education/handout  Therapeutic Activity: Mini lunge w/ UE support 2x10 cues for step length, trunk mechanics  STS+ foam roll push for truncal ext x8  Time spent w/ education/discussion re: functional movements pt has difficulty with (car transfers, floor transfers), components of movement, modifications, and relevant anatomy/physiology     [facility] Adult PT Treatment:                                                DATE: 01/17/24 Therapeutic Exercise: Passive wall lumbar extension x12 cues for comfortable ROM and control Seated adduction isometric unsupported x10 cues for posture, breath control Seated green band abduction x10 Seated green band SL abd x8 BIL HEP update + education/handout  Therapeutic Activity: MSK assessment + education 5xSTS + education Education/discussion re: low back symptoms, assessment findings, and PT goals/POC                                                                  PATIENT EDUCATION:  Education details: rationale for interventions, HEP  Person educated: Patient Education method: Explanation, Demonstration, Tactile cues, Verbal cues Education comprehension: verbalized understanding, returned demonstration, verbal cues required, tactile cues required, and needs further education     HOME EXERCISE PROGRAM: Access Code: [email or web address] (cervical) URL: [email or web address] Date: 01/07/2024 Prepared by: Arlester Bence  Exercises - Seated Cervical Retraction Protraction AROM  - 1 x daily - 7 x weekly - 2 sets - 10 reps - 3 sec hold - Seated Scapular Retraction  - 1 x daily - 7 x weekly - 2 sets - 10 reps - 3 sec hold - Seated Cervical Extension AROM  - 1 x daily - 7 x weekly -  2 sets - 10 reps - Supine Cervical Rotation AROM on Pillow  - 1 x daily - 7 x weekly - 1 sets - 15 reps    (lumbar) Access Code: [email or web address] URL: [email or web address] Date: 01/21/2024 Prepared by: Mayme Spearman  Exercises - Seated Hip Adduction Isometrics with Celeste Cola  - 2-3 x daily - 1 sets - 8 reps - Seated Hip Abduction  with Resistance  - 2-3 x daily - 1 sets - 8 reps - Seated Trunk Rotation - Arms Crossed  - 2-3 x daily - 1 sets - 8 reps - Seated Cat Cow  - 2-3 x daily - 1 sets - 8 reps  ASSESSMENT:  CLINICAL IMPRESSION: 01/21/2024:  Pt arrives w/ baseline symptoms, no issues with last session. Today expanding program to work on truncal mobility, functional strengthening introducing mini lunges to work on floor transfers. Pt tolerates activities well, endorses some muscular fatigue as expected but no increases in pain or adverse events. Cues for appropriate mechanics. Recommend continuing along current POC in order to address relevant deficits and improve functional tolerance. Pt departs today's session in no acute distress, all voiced questions/concerns addressed appropriately from PT perspective.     Per eval - Timouthy is a 73 y.o. male who was seen today for physical therapy evaluation and treatment for decreased Cervical Mobility related to L>R foraminal cervical stenosis from C2-C7. He is demonstrating decreased CS AROM and decreased postural endurance. His cervical rotation AROM improved from 35 degrees to 40 degrees bilaterally following cervical retraction today. He has related pain and difficulty with lifting, reading, driving and participation in recreational activities. He requires skilled PT services at this time to address relevant deficits and improve overall function.     OBJECTIVE IMPAIRMENTS: decreased ROM, impaired UE functional use, improper body mechanics, and postural dysfunction.   ACTIVITY LIMITATIONS: carrying and lifting  PARTICIPATION LIMITATIONS:  driving, shopping, community activity, and yard work  PERSONAL FACTORS: Age, Past/current experiences, and 3+ comorbidities: Relevant PMHx includes aortic insuffiency with Ross procedure, CHF, DM type II, esophageal stricture (with reported dysphagia), Left brachial plexus neuropathy, OSA, neurogenic claudication, BIL carpal tunnel  are also affecting patient's functional outcome.   REHAB POTENTIAL: Fair    CLINICAL DECISION MAKING: Stable/uncomplicated  EVALUATION COMPLEXITY: Low   GOALS: Goals reviewed with patient? YES  SHORT TERM GOALS: Target date: 02/04/2024   Patient will be independent with initial home program at least 3 days/week.  Baseline: provided at eval Goal Status: INITIAL   2.  Patient will demonstrate improved postural awareness for at least 15 minutes while seated without need for cueing from PT.  Baseline: see objective measures Goal Status: INITIAL   3.  Patient will demonstrate improved CS AROM by at least 5 degrees in all directions  Baseline: see objective measures Goal status: INITIAL   LONG TERM GOALS: Target date: 03/03/2024   Patient will report improved overall functional ability with NDI score of 4/50 or less.  Baseline: 8/50 Goal Status: INITIAL    2.  Patient will report ability to turn his head while driving, with less reliance on trunk rotation for compensation.  Baseline: using trunk rotation d/t 35 degree active rotation  Goal status: INITIAL  3.  Patient will demonstrate improved CS AROM by at least 10 degrees in all directions  Baseline: see objective measures Goal status: INITIAL   4. Pt will perform 5x Sit to Stand in less than or equal to 9 sec without UE support in order to reduce fall risk and improve functional mobility.  Baseline: 12 sec, gentle UE support at thighs  Goal status: INITIAL/NEW 01/17/24  5. Pt will demonstrate at least 75% lumbar AROM in all planes in order to facilitate improved functional mobility.  Baseline:  see ROM chart above Goal status: INITIAL/NEW 01/17/24  6. Pt will report ability to perform sitting/standing household tasks for >5min without increase in symptoms.  Baseline: reports 15-38min tolerance (dishes, workshop)  Goal status: INITIAL/New 01/17/24        PLAN:  PT FREQUENCY: 1-2x/week  PT DURATION: 8 weeks  PLANNED INTERVENTIONS: 97164- PT Re-evaluation, 97110-Therapeutic exercises, 97530- Therapeutic activity, 97112- Neuromuscular re-education, 97535- Self Care, 16109- Manual therapy, [id number]- Electrical stimulation (unattended), 60454- Traction (mechanical), [id number]- Ionotophoresis 4mg /ml Dexamethasone , Patient/Family education, Taping, Joint mobilization, Spinal manipulation, Spinal mobilization, Cryotherapy, and Moist heat  PLAN FOR NEXT SESSION: HEP review/update PRN. Continue working on global spinal mobility within pt tolerance, core stability. Pt wants to improve tolerance to stooped postures (both sitting and standing) and long term goal of working back into golfing. Use of manual therapy and modalities as indicated    Lovett Ruck PT, DPT 01/21/2024 11:33 AM

## 2024-01-21 ENCOUNTER — Encounter: Payer: Self-pay | Admitting: Physical Therapy

## 2024-01-21 ENCOUNTER — Ambulatory Visit: Admitting: Physical Therapy

## 2024-01-21 DIAGNOSIS — R293 Abnormal posture: Secondary | ICD-10-CM

## 2024-01-21 DIAGNOSIS — M6281 Muscle weakness (generalized): Secondary | ICD-10-CM

## 2024-01-24 ENCOUNTER — Other Ambulatory Visit (HOSPITAL_COMMUNITY): Payer: Self-pay

## 2024-01-24 NOTE — Therapy (Signed)
 OUTPATIENT PHYSICAL THERAPY TREATMENT   Patient Name: [name] [name] MRN: [id number] DOB:Nov 01, 1950, 73 y.o., male Today's Date: 01/25/2024  END OF SESSION:  PT End of Session - 01/25/24 0913     Visit Number 4    Number of Visits 17    Date for PT Re-Evaluation 03/03/24    Authorization Type [employer]    PT Start Time 0914    PT Stop Time 0954    PT Time Calculation (min) 40 min    Activity Tolerance Patient tolerated treatment well             Past Medical History:  Diagnosis Date   Acute gastritis    Aortic insufficiency    s/p Ross Procedure: AVR/Pulmonary autograft in aortic position & PVR with bioprosthetic PV 1994 in [place]   Aortic root aneurysm    Atrial flutter (HCC)    history- no atrial flutter since cardioversion   CHF (congestive heart failure) (HCC)    Diabetes mellitus without complication (HCC)    type 2   Early satiety    Esophageal stricture    GERD (gastroesophageal reflux disease)    Headache syndrome 10/18/2018   Hiatal hernia    Hyperlipidemia    Neuropathy    left brachial plexus   OSA on CPAP    mild obstructive sleep apnea overall but moderate during REM sleep with REM AHI 16.6/h and overall AHI 6.6/h.  In auto CPAP from 4 to 15 cm H2O   Pain, dental    SCC (squamous cell carcinoma) 02/10/2023   right forearm   Past Surgical History:  Procedure Laterality Date   AORTIC VALVE REPLACEMENT  1994   CARDIOVERSION N/A 03/14/2020   Procedure: CARDIOVERSION;  Surgeon: Rolan Ezra RAMAN, MD;  Location: [facility] ENDOSCOPY;  Service: Cardiovascular;  Laterality: N/A;   ESOPHAGEAL MANOMETRY N/A 09/23/2022   Procedure: ESOPHAGEAL MANOMETRY (EM);  Surgeon: Shila Gustav GAILS, MD;  Location: [facility] ENDOSCOPY;  Service: Gastroenterology;  Laterality: N/A;   ESOPHAGOGASTRODUODENOSCOPY N/A 03/30/2022   Procedure: ESOPHAGOGASTRODUODENOSCOPY (EGD);  Surgeon: Shyrl Linnie KIDD, MD;  Location: [facility] OR;  Service: Thoracic;  Laterality: N/A;    GANGLION CYST EXCISION  JAN 2002   HIATAL HERNIA REPAIR  DEC 2002   INTERCOSTAL NERVE BLOCK Right 03/30/2022   Procedure: INTERCOSTAL NERVE BLOCK;  Surgeon: Shyrl Linnie KIDD, MD;  Location: [facility] OR;  Service: Thoracic;  Laterality: Right;   TEE WITHOUT CARDIOVERSION N/A 03/14/2020   Procedure: TRANSESOPHAGEAL ECHOCARDIOGRAM (TEE);  Surgeon: Rolan Ezra RAMAN, MD;  Location: [facility] ENDOSCOPY;  Service: Cardiovascular;  Laterality: N/A;   TONSILLECTOMY     VASECTOMY  DEC 2002   Patient Active Problem List   Diagnosis Date Noted   Benign neoplasm of skin of lower limb 01/05/2024   History of neoplasm 01/05/2024   Lentigo 01/05/2024   Neurogenic claudication 01/05/2024   Peripheral venous insufficiency 01/05/2024   Seborrheic keratosis 01/05/2024   [name] 01/06/2023   Dysphagia 10/29/2022   Refractory chronic cough 07/22/2022   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   Aortic atherosclerosis (HCC) 01/19/2022   Other allergic rhinitis 12/31/2021   Degenerative disc disease, lumbar 04/04/2021   Abdominal bloating 12/14/2019   Controlled diabetes mellitus type 2 with complications (HCC) 12/14/2019   Headache syndrome 10/18/2018   Laryngopharyngeal reflux (LPR) 08/10/2016   BPH (benign prostatic hyperplasia) 06/03/2015   H/O Ross procedure 01/31/2015   Thoracic aortic aneurysm (HCC) 12/11/2013   Ganglion cyst 06/01/2013   Brachial plexus neuropathy 10/07/2011   Cervical  disc disease 10/07/2011   General medical examination 03/11/2011   Carpopedal spasm 02/25/2011   Arthritis of hand 02/25/2011   CARPAL TUNNEL SYNDROME, BILATERAL 06/10/2009   AORTIC VALVE REPLACEMENT, HX OF 06/10/2009   Hyperlipidemia 08/08/2008   Essential hypertension 08/08/2008   ESOPHAGEAL STRICTURE 03/29/2003   GERD 03/29/2003   HIATAL HERNIA 10/21/2000    PCP:  Mahlon Comer BRAVO, MD   REFERRING PROVIDER:  Mahlon Comer BRAVO, MD   REFERRING DIAG:  [id number] (ICD-10-CM) - Cervical spondylosis  with radiculopathy   New referral from PCP 01/09/24:  M51.360 (ICD-10-CM) - Degeneration of intervertebral disc of lumbar region with discogenic back pain  THERAPY DIAG:  Abnormal posture  Muscle weakness (generalized)  Rationale for Evaluation and Treatment: Rehabilitation  ONSET DATE: 2 years   SUBJECTIVE:                                                                                                                                                                                                        Per eval - Patient reports to PT with primary concern of stiffness in the neck. He states that he is not having any pain. He did have headaches that were alleviated with use of CPAP. He reports history of L brachial plexus injury that has significantly decreased his shoulder mobility.  Back subjective 01/17/24: States back has been bothering him all my life. States as a 12-3 year old fell off a horse and he wasn't able to move  for a couple days, isn't sure if that contributed to pain. States his pain is more of a weakness, low central>left, top of hip. States sometimes it will go into LLE but this will be resolved by orthotics in shoes, very infrequent. Tends to run posterolateral into calf. Has trouble balancing on L. Used to enjoy golfing but hasn't played in a couple years due to weakness/stiffness. Denies any recent acute worsening. States he has had imaging but it's been a while. Standing/sitting with stooped posture 15-20 min before needing a break.   Hand dominance: Right  SUBJECTIVE STATEMENT:  01/25/2024: did some mowing over the weekend and states he felt less pain than usual, also found hip extension exercise to be relieving. Felt good after last session. 4/10 posterior thigh discomfort at present which he relates to his sciatica. No other new updates.     PERTINENT HISTORY:  Relevant PMHx includes aortic insuffiency with Ross procedure, CHF, DM type II, esophageal stricture  (with reported dysphagia), Left brachial plexus neuropathy, OSA, neurogenic claudication, BIL carpal tunnel   PAIN:  Are  you having pain?  No  PRECAUTIONS: None  RED FLAGS: None  01/17/24: no BIL LE symptoms, no buckling, no saddle anesthesia, no bowel/bladder    WEIGHT BEARING RESTRICTIONS: No  FALLS:  Has patient fallen in last 6 months? No  LIVING ENVIRONMENT: Lives with: lives with their spouse Lives in: House/apartment Stairs: yes, indoor and outdoor with railings  Has following equipment at home: None  OCCUPATION: Retired  PLOF: Independent and Leisure: Yardwork  PATIENT GOALS: To be able to turn my neck, especially in traffic.  01/17/24: try to get back to golfing, lower body dressing, transfers after sitting  NEXT MD VISIT: 04/06/2024 to referring provider   OBJECTIVE:  Note: Objective measures were completed at Evaluation unless otherwise noted.  DIAGNOSTIC FINDINGS:   11/03/2023 MRI Cervical Spine   IMPRESSION: 1. C2-3: Mild bilateral foraminal narrowing, not compressive. 2. C3-4: Bilateral foraminal stenosis left worse than right. Either C4 nerve could be affected, more likely the left. 3. C4-5: Bilateral foraminal stenosis that could affect either or both C5 nerves. 4. C5-6: Bilateral foraminal stenosis that could affect either or both C6 nerves. 5. C6-7: Bilateral foraminal stenosis that could affect either or both C7 nerves. 6. Canal stenosis most notable at C3-4 and C4-5 but without gross compressive effect upon the spinal cord.    PATIENT SURVEYS:  NDI 8/50 LEFS 18/80  COGNITION: Overall cognitive status: Within functional limits for tasks assessed  SENSATION: Not tested  POSTURE: rounded shoulders, forward head, and increased thoracic kyphosis  PALPATION: No tenderness to palpation    CERVICAL ROM:   Active ROM A/PROM (deg) eval  Flexion 30  Extension 10  Right lateral flexion 30  Left lateral flexion 30  Right rotation 35  Left  rotation 35   (Blank rows = not tested)  UPPER EXTREMITY ROM:  Active ROM Right eval Left eval  Shoulder flexion WFL   Shoulder extension    Shoulder abduction WFL    Shoulder adduction    Shoulder extension    Shoulder internal rotation    Shoulder external rotation    Elbow flexion    Elbow extension    Wrist flexion    Wrist extension    Wrist ulnar deviation    Wrist radial deviation    Wrist pronation    Wrist supination     (Blank rows = not tested)  UPPER EXTREMITY MMT:  MMT Right eval Left eval  Shoulder flexion    Shoulder extension    Shoulder abduction    Shoulder adduction    Shoulder extension    Shoulder internal rotation    Shoulder external rotation    Middle trapezius    Lower trapezius    Elbow flexion    Elbow extension    Wrist flexion    Wrist extension    Wrist ulnar deviation    Wrist radial deviation    Wrist pronation    Wrist supination    Grip strength     (Blank rows = not tested)  CERVICAL SPECIAL TESTS:  Deferred for future visits as indicated  FUNCTIONAL TESTS:  Deferred for future visits as indicated    01/17/24 LUMBAR EXAM:  LUMBAR ROM:    A/PROM  01/17/24  Flexion Distal shin w/ stiffness, hamstring tightness  Extension 25% s  Right lateral flexion Just above knee, s  Left lateral flexion Mid thigh, s  Right rotation 25% s  Left rotation 25% s   (Blank rows = not tested) (Key: WFL =  within functional limits not formally assessed, * = concordant pain, s = stiffness/stretching sensation, NT = not tested) Comments:    LOWER EXTREMITY MMT:    MMT Right 01/17/24 Left 01/17/24  Hip flexion 4 4  Hip abduction (modified sitting) 5 5  Hip internal rotation 4+ 4  Hip external rotation 4+ 4  Knee flexion 5 4  Knee extension 5 5  Ankle dorsiflexion 5 5   (Blank rows = not tested) (Key: WFL = within functional limits not formally assessed, * = concordant pain, s = stiffness/stretching sensation, NT = not tested)   Comments:     FUNCTIONAL TEST: 5xSTS: 12.48 sec gentle UE support from thighs, provokes back weakness    TREATMENT DATE:   [facility] Adult PT Treatment:                                                DATE: 01/25/24 Therapeutic Exercise: Supine hamstring stretch w/ strap (wedge for comfort) x8 BIL Standing hip ext x12 alternating cues for posture Standing hip abd x8 BIL alt  Red band row 2x8 Supine cervical rotation AROM 2x8 BIL HEP discussion/education  Therapeutic Activity: MSK assessment, negative slump test BIL  Seated sciatic nerve glide LLE to assess symptom modification (no significant change)  Mini side lunge w/ UE support x8 BIL cues to limit truncal rotation Mini reverse lunge w/ UE support x8 BIL cues for posture    [facility] Adult PT Treatment:                                                DATE: 01/21/24 Therapeutic Exercise: Seated dynadisc cat/camel 2x8  Seated thoracolumbar ext x8  Standing hip extension x8  HEP update + education/handout  Therapeutic Activity: Mini lunge w/ UE support 2x10 cues for step length, trunk mechanics  STS+ foam roll push for truncal ext x8  Time spent w/ education/discussion re: functional movements pt has difficulty with (car transfers, floor transfers), components of movement, modifications, and relevant anatomy/physiology     [facility] Adult PT Treatment:                                                DATE: 01/17/24 Therapeutic Exercise: Passive wall lumbar extension x12 cues for comfortable ROM and control Seated adduction isometric unsupported x10 cues for posture, breath control Seated green band abduction x10 Seated green band SL abd x8 BIL HEP update + education/handout  Therapeutic Activity: MSK assessment + education 5xSTS + education Education/discussion re: low back symptoms, assessment findings, and PT goals/POC                                                                  PATIENT EDUCATION:  Education details:  rationale for interventions, HEP  Person educated: Patient Education method: Explanation, Demonstration, Tactile cues, Verbal cues Education comprehension: verbalized understanding, returned demonstration, verbal  cues required, tactile cues required, and needs further education     HOME EXERCISE PROGRAM: Access Code: [email or web address] (cervical) URL: [email or web address] Date: 01/07/2024 Prepared by: Marko Molt  Exercises - Seated Cervical Retraction Protraction AROM  - 1 x daily - 7 x weekly - 2 sets - 10 reps - 3 sec hold - Seated Scapular Retraction  - 1 x daily - 7 x weekly - 2 sets - 10 reps - 3 sec hold - Seated Cervical Extension AROM  - 1 x daily - 7 x weekly - 2 sets - 10 reps - Supine Cervical Rotation AROM on Pillow  - 1 x daily - 7 x weekly - 1 sets - 15 reps    (lumbar) Access Code: [email or web address] URL: [email or web address] Date: 01/21/2024 Prepared by: Alm Jenny  Exercises - Seated Hip Adduction Isometrics with Mercer  - 2-3 x daily - 1 sets - 8 reps - Seated Hip Abduction with Resistance  - 2-3 x daily - 1 sets - 8 reps - Seated Trunk Rotation - Arms Crossed  - 2-3 x daily - 1 sets - 8 reps - Seated Cat Cow  - 2-3 x daily - 1 sets - 8 reps  ASSESSMENT:  CLINICAL IMPRESSION: 01/25/2024:  Pt arrives w/ report of modest improvement in symptoms and good relief with hip ext exercise. He does endorse some LLE symptoms today - provoked more so by hamstring stretching, slump test negative and no change w/ nerve glides. Otherwise continuing to work on functional strengthening with mini lunge variations, hip/lumbar mobility. Tolerates activity quite well, reports gradual improvement in thigh pain and reduced stiffness as session goes on. No adverse events. Recommend continuing along current POC in order to address relevant deficits and improve functional tolerance. Pt departs today's session in no acute distress, all voiced questions/concerns addressed  appropriately from PT perspective.     Per eval - Krue is a 73 y.o. male who was seen today for physical therapy evaluation and treatment for decreased Cervical Mobility related to L>R foraminal cervical stenosis from C2-C7. He is demonstrating decreased CS AROM and decreased postural endurance. His cervical rotation AROM improved from 35 degrees to 40 degrees bilaterally following cervical retraction today. He has related pain and difficulty with lifting, reading, driving and participation in recreational activities. He requires skilled PT services at this time to address relevant deficits and improve overall function.     OBJECTIVE IMPAIRMENTS: decreased ROM, impaired UE functional use, improper body mechanics, and postural dysfunction.   ACTIVITY LIMITATIONS: carrying and lifting  PARTICIPATION LIMITATIONS: driving, shopping, community activity, and yard work  PERSONAL FACTORS: Age, Past/current experiences, and 3+ comorbidities: Relevant PMHx includes aortic insuffiency with Ross procedure, CHF, DM type II, esophageal stricture (with reported dysphagia), Left brachial plexus neuropathy, OSA, neurogenic claudication, BIL carpal tunnel  are also affecting patient's functional outcome.   REHAB POTENTIAL: Fair    CLINICAL DECISION MAKING: Stable/uncomplicated  EVALUATION COMPLEXITY: Low   GOALS: Goals reviewed with patient? YES  SHORT TERM GOALS: Target date: 02/04/2024   Patient will be independent with initial home program at least 3 days/week.  Baseline: provided at eval Goal Status: INITIAL   2.  Patient will demonstrate improved postural awareness for at least 15 minutes while seated without need for cueing from PT.  Baseline: see objective measures Goal Status: INITIAL   3.  Patient will demonstrate improved CS AROM by at least 5 degrees in all directions  Baseline: see objective measures Goal status: INITIAL   LONG TERM  GOALS: Target date: 03/03/2024   Patient will  report improved overall functional ability with NDI score of 4/50 or less.  Baseline: 8/50 Goal Status: INITIAL    2.  Patient will report ability to turn his head while driving, with less reliance on trunk rotation for compensation.  Baseline: using trunk rotation d/t 35 degree active rotation  Goal status: INITIAL  3.  Patient will demonstrate improved CS AROM by at least 10 degrees in all directions  Baseline: see objective measures Goal status: INITIAL   4. Pt will perform 5x Sit to Stand in less than or equal to 9 sec without UE support in order to reduce fall risk and improve functional mobility.  Baseline: 12 sec, gentle UE support at thighs  Goal status: INITIAL/NEW 01/17/24  5. Pt will demonstrate at least 75% lumbar AROM in all planes in order to facilitate improved functional mobility.  Baseline: see ROM chart above Goal status: INITIAL/NEW 01/17/24  6. Pt will report ability to perform sitting/standing household tasks for >40min without increase in symptoms.  Baseline: reports 15-74min tolerance (dishes, workshop)  Goal status: INITIAL/New 01/17/24        PLAN:  PT FREQUENCY: 1-2x/week  PT DURATION: 8 weeks  PLANNED INTERVENTIONS: 97164- PT Re-evaluation, 97110-Therapeutic exercises, 97530- Therapeutic activity, 97112- Neuromuscular re-education, 97535- Self Care, [id number]- Manual therapy, [id number]- Electrical stimulation (unattended), 02987- Traction (mechanical), 97033- Ionotophoresis 4mg /ml Dexamethasone , Patient/Family education, Taping, Joint mobilization, Spinal manipulation, Spinal mobilization, Cryotherapy, and Moist heat  PLAN FOR NEXT SESSION: HEP review/update PRN. Continue working on global spinal mobility within pt tolerance, core stability. Pt wants to improve tolerance to stooped postures (both sitting and standing) and long term goal of working back into golfing. Use of manual therapy and modalities as indicated    Alm DELENA Jenny PT, DPT 01/25/2024 9:57  AM

## 2024-01-25 ENCOUNTER — Encounter: Payer: Self-pay | Admitting: Physical Therapy

## 2024-01-25 ENCOUNTER — Encounter: Admitting: Physical Therapy

## 2024-01-25 ENCOUNTER — Ambulatory Visit: Admitting: Physical Therapy

## 2024-01-25 DIAGNOSIS — R293 Abnormal posture: Secondary | ICD-10-CM | POA: Diagnosis not present

## 2024-01-25 DIAGNOSIS — M6281 Muscle weakness (generalized): Secondary | ICD-10-CM

## 2024-01-27 ENCOUNTER — Encounter: Payer: Self-pay | Admitting: Physical Therapy

## 2024-01-27 ENCOUNTER — Ambulatory Visit: Admitting: Physical Therapy

## 2024-01-27 DIAGNOSIS — M6281 Muscle weakness (generalized): Secondary | ICD-10-CM

## 2024-01-27 DIAGNOSIS — R293 Abnormal posture: Secondary | ICD-10-CM | POA: Diagnosis not present

## 2024-01-27 NOTE — Therapy (Signed)
 OUTPATIENT PHYSICAL THERAPY TREATMENT   Patient Name: [name] [name] MRN: [id number] DOB:18-Apr-1951, 73 y.o., male Today's Date: 01/27/2024  END OF SESSION:  PT End of Session - 01/27/24 0913     Visit Number 5    Number of Visits 17    Date for PT Re-Evaluation 03/03/24    Authorization Type [employer]    PT Start Time 0914    PT Stop Time 0954    PT Time Calculation (min) 40 min    Activity Tolerance Patient tolerated treatment well              Past Medical History:  Diagnosis Date   Acute gastritis    Aortic insufficiency    s/p Ross Procedure: AVR/Pulmonary autograft in aortic position & PVR with bioprosthetic PV 1994 in [place]   Aortic root aneurysm    Atrial flutter (HCC)    history- no atrial flutter since cardioversion   CHF (congestive heart failure) (HCC)    Diabetes mellitus without complication (HCC)    type 2   Early satiety    Esophageal stricture    GERD (gastroesophageal reflux disease)    Headache syndrome 10/18/2018   Hiatal hernia    Hyperlipidemia    Neuropathy    left brachial plexus   OSA on CPAP    mild obstructive sleep apnea overall but moderate during REM sleep with REM AHI 16.6/h and overall AHI 6.6/h.  In auto CPAP from 4 to 15 cm H2O   Pain, dental    SCC (squamous cell carcinoma) 02/10/2023   right forearm   Past Surgical History:  Procedure Laterality Date   AORTIC VALVE REPLACEMENT  1994   CARDIOVERSION N/A 03/14/2020   Procedure: CARDIOVERSION;  Surgeon: Rolan Ezra RAMAN, MD;  Location: [facility] ENDOSCOPY;  Service: Cardiovascular;  Laterality: N/A;   ESOPHAGEAL MANOMETRY N/A 09/23/2022   Procedure: ESOPHAGEAL MANOMETRY (EM);  Surgeon: Shila Gustav GAILS, MD;  Location: [facility] ENDOSCOPY;  Service: Gastroenterology;  Laterality: N/A;   ESOPHAGOGASTRODUODENOSCOPY N/A 03/30/2022   Procedure: ESOPHAGOGASTRODUODENOSCOPY (EGD);  Surgeon: Shyrl Linnie KIDD, MD;  Location: [facility] OR;  Service: Thoracic;  Laterality: N/A;    GANGLION CYST EXCISION  JAN 2002   HIATAL HERNIA REPAIR  DEC 2002   INTERCOSTAL NERVE BLOCK Right 03/30/2022   Procedure: INTERCOSTAL NERVE BLOCK;  Surgeon: Shyrl Linnie KIDD, MD;  Location: [facility] OR;  Service: Thoracic;  Laterality: Right;   TEE WITHOUT CARDIOVERSION N/A 03/14/2020   Procedure: TRANSESOPHAGEAL ECHOCARDIOGRAM (TEE);  Surgeon: Rolan Ezra RAMAN, MD;  Location: [facility] ENDOSCOPY;  Service: Cardiovascular;  Laterality: N/A;   TONSILLECTOMY     VASECTOMY  DEC 2002   Patient Active Problem List   Diagnosis Date Noted   Benign neoplasm of skin of lower limb 01/05/2024   History of neoplasm 01/05/2024   Lentigo 01/05/2024   Neurogenic claudication 01/05/2024   Peripheral venous insufficiency 01/05/2024   Seborrheic keratosis 01/05/2024   [name] 01/06/2023   Dysphagia 10/29/2022   Refractory chronic cough 07/22/2022   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   Aortic atherosclerosis (HCC) 01/19/2022   Other allergic rhinitis 12/31/2021   Degenerative disc disease, lumbar 04/04/2021   Abdominal bloating 12/14/2019   Controlled diabetes mellitus type 2 with complications (HCC) 12/14/2019   Headache syndrome 10/18/2018   Laryngopharyngeal reflux (LPR) 08/10/2016   BPH (benign prostatic hyperplasia) 06/03/2015   H/O Ross procedure 01/31/2015   Thoracic aortic aneurysm (HCC) 12/11/2013   Ganglion cyst 06/01/2013   Brachial plexus neuropathy 10/07/2011  Cervical disc disease 10/07/2011   General medical examination 03/11/2011   Carpopedal spasm 02/25/2011   Arthritis of hand 02/25/2011   CARPAL TUNNEL SYNDROME, BILATERAL 06/10/2009   AORTIC VALVE REPLACEMENT, HX OF 06/10/2009   Hyperlipidemia 08/08/2008   Essential hypertension 08/08/2008   ESOPHAGEAL STRICTURE 03/29/2003   GERD 03/29/2003   HIATAL HERNIA 10/21/2000    PCP:  Mahlon Comer BRAVO, MD   REFERRING PROVIDER:  Mahlon Comer BRAVO, MD   REFERRING DIAG:  [phone] (ICD-10-CM) - Cervical spondylosis  with radiculopathy   New referral from PCP 01/09/24:  M51.360 (ICD-10-CM) - Degeneration of intervertebral disc of lumbar region with discogenic back pain  THERAPY DIAG:  Abnormal posture  Muscle weakness (generalized)  Rationale for Evaluation and Treatment: Rehabilitation  ONSET DATE: 2 years   SUBJECTIVE:                                                                                                                                                                                                        Per eval - Patient reports to PT with primary concern of stiffness in the neck. He states that he is not having any pain. He did have headaches that were alleviated with use of CPAP. He reports history of L brachial plexus injury that has significantly decreased his shoulder mobility.  Back subjective 01/17/24: States back has been bothering him all my life. States as a 67-49 year old fell off a horse and he wasn't able to move  for a couple days, isn't sure if that contributed to pain. States his pain is more of a weakness, low central>left, top of hip. States sometimes it will go into LLE but this will be resolved by orthotics in shoes, very infrequent. Tends to run posterolateral into calf. Has trouble balancing on L. Used to enjoy golfing but hasn't played in a couple years due to weakness/stiffness. Denies any recent acute worsening. States he has had imaging but it's been a while. Standing/sitting with stooped posture 15-20 min before needing a break.   Hand dominance: Right  SUBJECTIVE STATEMENT:  01/27/2024: states he did really well after last session, no soreness or pain. Feeling good today, still having some mild posterior thigh pain 2-3/10, describes more so as tightness than pain.       PERTINENT HISTORY:  Relevant PMHx includes aortic insuffiency with Ross procedure, CHF, DM type II, esophageal stricture (with reported dysphagia), Left brachial plexus neuropathy, OSA,  neurogenic claudication, BIL carpal tunnel   PAIN:  Are you having pain?  No  PRECAUTIONS: None  RED  FLAGS: None  01/17/24: no BIL LE symptoms, no buckling, no saddle anesthesia, no bowel/bladder    WEIGHT BEARING RESTRICTIONS: No  FALLS:  Has patient fallen in last 6 months? No  LIVING ENVIRONMENT: Lives with: lives with their spouse Lives in: House/apartment Stairs: yes, indoor and outdoor with railings  Has following equipment at home: None  OCCUPATION: Retired  PLOF: Independent and Leisure: Yardwork  PATIENT GOALS: To be able to turn my neck, especially in traffic.  01/17/24: try to get back to golfing, lower body dressing, transfers after sitting  NEXT MD VISIT: 04/06/2024 to referring provider   OBJECTIVE:  Note: Objective measures were completed at Evaluation unless otherwise noted.  DIAGNOSTIC FINDINGS:   11/03/2023 MRI Cervical Spine   IMPRESSION: 1. C2-3: Mild bilateral foraminal narrowing, not compressive. 2. C3-4: Bilateral foraminal stenosis left worse than right. Either C4 nerve could be affected, more likely the left. 3. C4-5: Bilateral foraminal stenosis that could affect either or both C5 nerves. 4. C5-6: Bilateral foraminal stenosis that could affect either or both C6 nerves. 5. C6-7: Bilateral foraminal stenosis that could affect either or both C7 nerves. 6. Canal stenosis most notable at C3-4 and C4-5 but without gross compressive effect upon the spinal cord.    PATIENT SURVEYS:  NDI 8/50 LEFS 18/80  COGNITION: Overall cognitive status: Within functional limits for tasks assessed  SENSATION: Not tested  POSTURE: rounded shoulders, forward head, and increased thoracic kyphosis  PALPATION: No tenderness to palpation    CERVICAL ROM:   Active ROM A/PROM (deg) eval  Flexion 30  Extension 10  Right lateral flexion 30  Left lateral flexion 30  Right rotation 35  Left rotation 35   (Blank rows = not tested)  UPPER EXTREMITY  ROM:  Active ROM Right eval Left eval  Shoulder flexion WFL   Shoulder extension    Shoulder abduction WFL    Shoulder adduction    Shoulder extension    Shoulder internal rotation    Shoulder external rotation    Elbow flexion    Elbow extension    Wrist flexion    Wrist extension    Wrist ulnar deviation    Wrist radial deviation    Wrist pronation    Wrist supination     (Blank rows = not tested)  UPPER EXTREMITY MMT:  MMT Right eval Left eval  Shoulder flexion    Shoulder extension    Shoulder abduction    Shoulder adduction    Shoulder extension    Shoulder internal rotation    Shoulder external rotation    Middle trapezius    Lower trapezius    Elbow flexion    Elbow extension    Wrist flexion    Wrist extension    Wrist ulnar deviation    Wrist radial deviation    Wrist pronation    Wrist supination    Grip strength     (Blank rows = not tested)  CERVICAL SPECIAL TESTS:  Deferred for future visits as indicated  FUNCTIONAL TESTS:  Deferred for future visits as indicated    01/17/24 LUMBAR EXAM:  LUMBAR ROM:    A/PROM  01/17/24  Flexion Distal shin w/ stiffness, hamstring tightness  Extension 25% s  Right lateral flexion Just above knee, s  Left lateral flexion Mid thigh, s  Right rotation 25% s  Left rotation 25% s   (Blank rows = not tested) (Key: WFL = within functional limits not formally assessed, * = concordant pain,  s = stiffness/stretching sensation, NT = not tested) Comments:    LOWER EXTREMITY MMT:    MMT Right 01/17/24 Left 01/17/24  Hip flexion 4 4  Hip abduction (modified sitting) 5 5  Hip internal rotation 4+ 4  Hip external rotation 4+ 4  Knee flexion 5 4  Knee extension 5 5  Ankle dorsiflexion 5 5   (Blank rows = not tested) (Key: WFL = within functional limits not formally assessed, * = concordant pain, s = stiffness/stretching sensation, NT = not tested)  Comments:     FUNCTIONAL TEST: 5xSTS: 12.48 sec gentle  UE support from thighs, provokes back weakness    TREATMENT DATE:   [facility] Adult PT Treatment:                                                DATE: 01/27/24 Therapeutic Exercise: Seated cat/camel x12 cues for reduced compensations Supine (wedge) LTR 2x5 BIL cues for comfortable ROM, relaxing head, and breath control  HEP update + education/handout  Neuromuscular re-ed: Green band paloff press 2x8 BIL cues for pacing and core engagement  Seated dynadisc fwd chest press +OH press hands clasped x10  Therapeutic Activity: Front carry 10# KB 3x22ft cues for posture  STS + foam roll pushdown 2x10 cues for posture and pacing  Education/discussion re: activity modification/progression, relevant anatomy/physiology and rationale for interventions    [facility] Adult PT Treatment:                                                DATE: 01/25/24 Therapeutic Exercise: Supine hamstring stretch w/ strap (wedge for comfort) x8 BIL Standing hip ext x12 alternating cues for posture Standing hip abd x8 BIL alt  Red band row 2x8 Supine cervical rotation AROM 2x8 BIL HEP discussion/education  Therapeutic Activity: MSK assessment, negative slump test BIL  Seated sciatic nerve glide LLE to assess symptom modification (no significant change)  Mini side lunge w/ UE support x8 BIL cues to limit truncal rotation Mini reverse lunge w/ UE support x8 BIL cues for posture    [facility] Adult PT Treatment:                                                DATE: 01/21/24 Therapeutic Exercise: Seated dynadisc cat/camel 2x8  Seated thoracolumbar ext x8  Standing hip extension x8  HEP update + education/handout  Therapeutic Activity: Mini lunge w/ UE support 2x10 cues for step length, trunk mechanics  STS+ foam roll push for truncal ext x8  Time spent w/ education/discussion re: functional movements pt has difficulty with (car transfers, floor transfers), components of movement, modifications, and relevant  anatomy/physiology                                                                PATIENT EDUCATION:  Education details: rationale for interventions, HEP  Person educated:  Patient Education method: Explanation, Demonstration, Tactile cues, Verbal cues Education comprehension: verbalized understanding, returned demonstration, verbal cues required, tactile cues required, and needs further education     HOME EXERCISE PROGRAM: Access Code: [email or web address] (cervical) URL: [email or web address] Date: 01/07/2024 Prepared by: Marko Molt  Exercises - Seated Cervical Retraction Protraction AROM  - 1 x daily - 7 x weekly - 2 sets - 10 reps - 3 sec hold - Seated Scapular Retraction  - 1 x daily - 7 x weekly - 2 sets - 10 reps - 3 sec hold - Seated Cervical Extension AROM  - 1 x daily - 7 x weekly - 2 sets - 10 reps - Supine Cervical Rotation AROM on Pillow  - 1 x daily - 7 x weekly - 1 sets - 15 reps    (lumbar) Access Code: [email or web address] URL: [email or web address] Date: 01/27/2024 Prepared by: Alm Jenny  Exercises - Seated Hip Adduction Isometrics with Mercer  - 2-3 x daily - 1 sets - 8 reps - Seated Hip Abduction with Resistance  - 2-3 x daily - 1 sets - 8 reps - Seated Cat Cow  - 2-3 x daily - 1 sets - 8 reps - Standing Anti-Rotation Press with Anchored Resistance  - 2-3 x daily - 1 sets - 8 reps - Supine Lower Trunk Rotation  - 2-3 x daily - 1 sets - 8 reps  ASSESSMENT:  CLINICAL IMPRESSION: 01/27/2024:  Pt arrives w/ report of continued improvement, no issues after last session. Building volume with functional strengthening and introduction of front carries. Also beginning gentle lumbar rotational mobility work. Continues to tolerate activity quite well, some muscular fatigue w/ strength/stability work but no pain or adverse events. Cues as above. Recommend continuing along current POC in order to address relevant deficits and improve functional tolerance. Pt  departs today's session in no acute distress, all voiced questions/concerns addressed appropriately from PT perspective.     Per eval - Cameo is a 73 y.o. male who was seen today for physical therapy evaluation and treatment for decreased Cervical Mobility related to L>R foraminal cervical stenosis from C2-C7. He is demonstrating decreased CS AROM and decreased postural endurance. His cervical rotation AROM improved from 35 degrees to 40 degrees bilaterally following cervical retraction today. He has related pain and difficulty with lifting, reading, driving and participation in recreational activities. He requires skilled PT services at this time to address relevant deficits and improve overall function.     OBJECTIVE IMPAIRMENTS: decreased ROM, impaired UE functional use, improper body mechanics, and postural dysfunction.   ACTIVITY LIMITATIONS: carrying and lifting  PARTICIPATION LIMITATIONS: driving, shopping, community activity, and yard work  PERSONAL FACTORS: Age, Past/current experiences, and 3+ comorbidities: Relevant PMHx includes aortic insuffiency with Ross procedure, CHF, DM type II, esophageal stricture (with reported dysphagia), Left brachial plexus neuropathy, OSA, neurogenic claudication, BIL carpal tunnel  are also affecting patient's functional outcome.   REHAB POTENTIAL: Fair    CLINICAL DECISION MAKING: Stable/uncomplicated  EVALUATION COMPLEXITY: Low   GOALS: Goals reviewed with patient? YES  SHORT TERM GOALS: Target date: 02/04/2024   Patient will be independent with initial home program at least 3 days/week.  Baseline: provided at eval Goal Status: INITIAL   2.  Patient will demonstrate improved postural awareness for at least 15 minutes while seated without need for cueing from PT.  Baseline: see objective measures Goal Status: INITIAL   3.  Patient will demonstrate improved CS AROM by at least 5 degrees in all directions  Baseline:  see objective  measures Goal status: INITIAL   LONG TERM GOALS: Target date: 03/03/2024   Patient will report improved overall functional ability with NDI score of 4/50 or less.  Baseline: 8/50 Goal Status: INITIAL    2.  Patient will report ability to turn his head while driving, with less reliance on trunk rotation for compensation.  Baseline: using trunk rotation d/t 35 degree active rotation  Goal status: INITIAL  3.  Patient will demonstrate improved CS AROM by at least 10 degrees in all directions  Baseline: see objective measures Goal status: INITIAL   4. Pt will perform 5x Sit to Stand in less than or equal to 9 sec without UE support in order to reduce fall risk and improve functional mobility.  Baseline: 12 sec, gentle UE support at thighs  Goal status: INITIAL/NEW 01/17/24  5. Pt will demonstrate at least 75% lumbar AROM in all planes in order to facilitate improved functional mobility.  Baseline: see ROM chart above Goal status: INITIAL/NEW 01/17/24  6. Pt will report ability to perform sitting/standing household tasks for >68min without increase in symptoms.  Baseline: reports 15-32min tolerance (dishes, workshop)  Goal status: INITIAL/New 01/17/24        PLAN:  PT FREQUENCY: 1-2x/week  PT DURATION: 8 weeks  PLANNED INTERVENTIONS: 97164- PT Re-evaluation, 97110-Therapeutic exercises, 97530- Therapeutic activity, 97112- Neuromuscular re-education, 97535- Self Care, [id number]- Manual therapy, [id number]- Electrical stimulation (unattended), 02987- Traction (mechanical), 97033- Ionotophoresis 4mg /ml Dexamethasone , Patient/Family education, Taping, Joint mobilization, Spinal manipulation, Spinal mobilization, Cryotherapy, and Moist heat  PLAN FOR NEXT SESSION: HEP review/update PRN. Continue working on global spinal mobility within pt tolerance, core stability. Pt wants to improve tolerance to stooped postures (both sitting and standing) and long term goal of working back into golfing. Use  of manual therapy and modalities as indicated    Alm DELENA Jenny PT, DPT 01/27/2024 10:48 AM

## 2024-01-31 ENCOUNTER — Ambulatory Visit

## 2024-01-31 DIAGNOSIS — R293 Abnormal posture: Secondary | ICD-10-CM | POA: Diagnosis not present

## 2024-01-31 DIAGNOSIS — M6281 Muscle weakness (generalized): Secondary | ICD-10-CM

## 2024-01-31 NOTE — Therapy (Signed)
 OUTPATIENT PHYSICAL THERAPY TREATMENT   Patient Name: KAMARON DESKINS MRN: [id number] DOB:1950/12/06, 73 y.o., male Today's Date: 01/31/2024  END OF SESSION:  PT End of Session - 01/31/24 1133     Visit Number 6    Number of Visits 17    Date for PT Re-Evaluation 03/03/24    Authorization Type [employer]    PT Start Time 1133    PT Stop Time 1211    PT Time Calculation (min) 38 min    Activity Tolerance Patient tolerated treatment well    Behavior During Therapy WFL for tasks assessed/performed               Past Medical History:  Diagnosis Date   Acute gastritis    Aortic insufficiency    s/p Ross Procedure: AVR/Pulmonary autograft in aortic position & PVR with bioprosthetic PV 1994 in [place]   Aortic root aneurysm    Atrial flutter (HCC)    history- no atrial flutter since cardioversion   CHF (congestive heart failure) (HCC)    Diabetes mellitus without complication (HCC)    type 2   Early satiety    Esophageal stricture    GERD (gastroesophageal reflux disease)    Headache syndrome 10/18/2018   Hiatal hernia    Hyperlipidemia    Neuropathy    left brachial plexus   OSA on CPAP    mild obstructive sleep apnea overall but moderate during REM sleep with REM AHI 16.6/h and overall AHI 6.6/h.  In auto CPAP from 4 to 15 cm H2O   Pain, dental    SCC (squamous cell carcinoma) 02/10/2023   right forearm   Past Surgical History:  Procedure Laterality Date   AORTIC VALVE REPLACEMENT  1994   CARDIOVERSION N/A 03/14/2020   Procedure: CARDIOVERSION;  Surgeon: Rolan Ezra RAMAN, MD;  Location: [facility] ENDOSCOPY;  Service: Cardiovascular;  Laterality: N/A;   ESOPHAGEAL MANOMETRY N/A 09/23/2022   Procedure: ESOPHAGEAL MANOMETRY (EM);  Surgeon: Shila Gustav GAILS, MD;  Location: [facility] ENDOSCOPY;  Service: Gastroenterology;  Laterality: N/A;   ESOPHAGOGASTRODUODENOSCOPY N/A 03/30/2022   Procedure: ESOPHAGOGASTRODUODENOSCOPY (EGD);  Surgeon: Shyrl Linnie KIDD, MD;   Location: [facility] OR;  Service: Thoracic;  Laterality: N/A;   GANGLION CYST EXCISION  JAN 2002   HIATAL HERNIA REPAIR  DEC 2002   INTERCOSTAL NERVE BLOCK Right 03/30/2022   Procedure: INTERCOSTAL NERVE BLOCK;  Surgeon: Shyrl Linnie KIDD, MD;  Location: [facility] OR;  Service: Thoracic;  Laterality: Right;   TEE WITHOUT CARDIOVERSION N/A 03/14/2020   Procedure: TRANSESOPHAGEAL ECHOCARDIOGRAM (TEE);  Surgeon: Rolan Ezra RAMAN, MD;  Location: [facility] ENDOSCOPY;  Service: Cardiovascular;  Laterality: N/A;   TONSILLECTOMY     VASECTOMY  DEC 2002   Patient Active Problem List   Diagnosis Date Noted   Benign neoplasm of skin of lower limb 01/05/2024   History of neoplasm 01/05/2024   Lentigo 01/05/2024   Neurogenic claudication 01/05/2024   Peripheral venous insufficiency 01/05/2024   Seborrheic keratosis 01/05/2024   [name] 01/06/2023   Dysphagia 10/29/2022   Refractory chronic cough 07/22/2022   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   Aortic atherosclerosis (HCC) 01/19/2022   Other allergic rhinitis 12/31/2021   Degenerative disc disease, lumbar 04/04/2021   Abdominal bloating 12/14/2019   Controlled diabetes mellitus type 2 with complications (HCC) 12/14/2019   Headache syndrome 10/18/2018   Laryngopharyngeal reflux (LPR) 08/10/2016   BPH (benign prostatic hyperplasia) 06/03/2015   H/O Ross procedure 01/31/2015   Thoracic aortic aneurysm (HCC) 12/11/2013  Ganglion cyst 06/01/2013   Brachial plexus neuropathy 10/07/2011   Cervical disc disease 10/07/2011   General medical examination 03/11/2011   Carpopedal spasm 02/25/2011   Arthritis of hand 02/25/2011   CARPAL TUNNEL SYNDROME, BILATERAL 06/10/2009   AORTIC VALVE REPLACEMENT, HX OF 06/10/2009   Hyperlipidemia 08/08/2008   Essential hypertension 08/08/2008   ESOPHAGEAL STRICTURE 03/29/2003   GERD 03/29/2003   HIATAL HERNIA 10/21/2000    PCP:  Mahlon Comer BRAVO, MD   REFERRING PROVIDER:  Mahlon Comer BRAVO, MD    REFERRING DIAG:  [phone] (ICD-10-CM) - Cervical spondylosis with radiculopathy   New referral from PCP 01/09/24:  M51.360 (ICD-10-CM) - Degeneration of intervertebral disc of lumbar region with discogenic back pain  THERAPY DIAG:  Abnormal posture  Muscle weakness (generalized)  Rationale for Evaluation and Treatment: Rehabilitation  ONSET DATE: 2 years   SUBJECTIVE:                                                                                                                                                                                                        Per eval - Patient reports to PT with primary concern of stiffness in the neck. He states that he is not having any pain. He did have headaches that were alleviated with use of CPAP. He reports history of L brachial plexus injury that has significantly decreased his shoulder mobility.  Back subjective 01/17/24: States back has been bothering him all my life. States as a 72-40 year old fell off a horse and he wasn't able to move  for a couple days, isn't sure if that contributed to pain. States his pain is more of a weakness, low central>left, top of hip. States sometimes it will go into LLE but this will be resolved by orthotics in shoes, very infrequent. Tends to run posterolateral into calf. Has trouble balancing on L. Used to enjoy golfing but hasn't played in a couple years due to weakness/stiffness. Denies any recent acute worsening. States he has had imaging but it's been a while. Standing/sitting with stooped posture 15-20 min before needing a break.   Hand dominance: Right  SUBJECTIVE STATEMENT:  01/31/2024: Patient reporting that he feels PT has been very helpful. Continues to have mild posterior thigh pain. He is working on the majority of his HEP daily, but less frequently performs sciatic nerve glide and seated cat cow.     PERTINENT HISTORY:  Relevant PMHx includes aortic insuffiency with Ross procedure, CHF, DM  type II, esophageal stricture (with reported dysphagia), Left brachial plexus neuropathy, OSA, neurogenic claudication, BIL  carpal tunnel   PAIN:  Are you having pain?  No  PRECAUTIONS: None  RED FLAGS: None  01/17/24: no BIL LE symptoms, no buckling, no saddle anesthesia, no bowel/bladder    WEIGHT BEARING RESTRICTIONS: No  FALLS:  Has patient fallen in last 6 months? No  LIVING ENVIRONMENT: Lives with: lives with their spouse Lives in: House/apartment Stairs: yes, indoor and outdoor with railings  Has following equipment at home: None  OCCUPATION: Retired  PLOF: Independent and Leisure: Yardwork  PATIENT GOALS: To be able to turn my neck, especially in traffic.  01/17/24: try to get back to golfing, lower body dressing, transfers after sitting  NEXT MD VISIT: 04/06/2024 to referring provider   OBJECTIVE:  Note: Objective measures were completed at Evaluation unless otherwise noted.  DIAGNOSTIC FINDINGS:   11/03/2023 MRI Cervical Spine   IMPRESSION: 1. C2-3: Mild bilateral foraminal narrowing, not compressive. 2. C3-4: Bilateral foraminal stenosis left worse than right. Either C4 nerve could be affected, more likely the left. 3. C4-5: Bilateral foraminal stenosis that could affect either or both C5 nerves. 4. C5-6: Bilateral foraminal stenosis that could affect either or both C6 nerves. 5. C6-7: Bilateral foraminal stenosis that could affect either or both C7 nerves. 6. Canal stenosis most notable at C3-4 and C4-5 but without gross compressive effect upon the spinal cord.    PATIENT SURVEYS:  NDI 8/50 LEFS 18/80  COGNITION: Overall cognitive status: Within functional limits for tasks assessed  SENSATION: Not tested  POSTURE: rounded shoulders, forward head, and increased thoracic kyphosis  PALPATION: No tenderness to palpation    CERVICAL ROM:   Active ROM A/PROM (deg) eval  Flexion 30  Extension 10  Right lateral flexion 30  Left lateral flexion  30  Right rotation 35  Left rotation 35   (Blank rows = not tested)  UPPER EXTREMITY ROM:  Active ROM Right eval Left eval  Shoulder flexion WFL   Shoulder extension    Shoulder abduction WFL    Shoulder adduction    Shoulder extension    Shoulder internal rotation    Shoulder external rotation    Elbow flexion    Elbow extension    Wrist flexion    Wrist extension    Wrist ulnar deviation    Wrist radial deviation    Wrist pronation    Wrist supination     (Blank rows = not tested)  UPPER EXTREMITY MMT:  MMT Right eval Left eval  Shoulder flexion    Shoulder extension    Shoulder abduction    Shoulder adduction    Shoulder extension    Shoulder internal rotation    Shoulder external rotation    Middle trapezius    Lower trapezius    Elbow flexion    Elbow extension    Wrist flexion    Wrist extension    Wrist ulnar deviation    Wrist radial deviation    Wrist pronation    Wrist supination    Grip strength     (Blank rows = not tested)  CERVICAL SPECIAL TESTS:  Deferred for future visits as indicated  FUNCTIONAL TESTS:  Deferred for future visits as indicated    01/17/24 LUMBAR EXAM:  LUMBAR ROM:    A/PROM  01/17/24  Flexion Distal shin w/ stiffness, hamstring tightness  Extension 25% s  Right lateral flexion Just above knee, s  Left lateral flexion Mid thigh, s  Right rotation 25% s  Left rotation 25% s   (Blank  rows = not tested) (Key: WFL = within functional limits not formally assessed, * = concordant pain, s = stiffness/stretching sensation, NT = not tested) Comments:    LOWER EXTREMITY MMT:    MMT Right 01/17/24 Left 01/17/24  Hip flexion 4 4  Hip abduction (modified sitting) 5 5  Hip internal rotation 4+ 4  Hip external rotation 4+ 4  Knee flexion 5 4  Knee extension 5 5  Ankle dorsiflexion 5 5   (Blank rows = not tested) (Key: WFL = within functional limits not formally assessed, * = concordant pain, s =  stiffness/stretching sensation, NT = not tested)  Comments:     FUNCTIONAL TEST: 5xSTS: 12.48 sec gentle UE support from thighs, provokes back weakness    TREATMENT DATE:   [facility] Adult PT Treatment:                                                DATE: 01/31/2024  Therapeutic Exercise: Seated cat/camel x12 cues for reduced compensations Supine (wedge) LTR 2x6 BIL cues for comfortable ROM, relaxing head, and breath control (2nd round with cervical rotation to CL side) HEP update + education/handout  Neuromuscular re-ed: Dynadisc Green band paloff press 2x11 BIL cues for pacing and core engagement  Seated dynadisc fwd chest press +OH press hands clasped x10  Sciatic nerve glide x 10   Therapeutic Activity: Front carry 10# KB 3x63ft cues for posture  STS + foam roll pushdown 2x10 cues for posture and pacing   [facility] Adult PT Treatment:                                                DATE: 01/27/24 Therapeutic Exercise: Seated cat/camel x12 cues for reduced compensations Supine (wedge) LTR 2x5 BIL cues for comfortable ROM, relaxing head, and breath control  HEP update + education/handout  Neuromuscular re-ed: Green band paloff press 2x8 BIL cues for pacing and core engagement  Seated dynadisc fwd chest press +OH press hands clasped x10  Therapeutic Activity: Front carry 10# KB 3x82ft cues for posture  STS + foam roll pushdown 2x10 cues for posture and pacing  Education/discussion re: activity modification/progression, relevant anatomy/physiology and rationale for interventions    [facility] Adult PT Treatment:                                                DATE: 01/25/24 Therapeutic Exercise: Supine hamstring stretch w/ strap (wedge for comfort) x8 BIL Standing hip ext x12 alternating cues for posture Standing hip abd x8 BIL alt  Red band row 2x8 Supine cervical rotation AROM 2x8 BIL HEP discussion/education  Therapeutic Activity: MSK assessment, negative slump test BIL  Seated  sciatic nerve glide LLE to assess symptom modification (no significant change)  Mini side lunge w/ UE support x8 BIL cues to limit truncal rotation Mini reverse lunge w/ UE support x8 BIL cues for posture  PATIENT EDUCATION:  Education details: rationale for interventions, HEP  Person educated: Patient Education method: Explanation, Demonstration, Tactile cues, Verbal cues Education comprehension: verbalized understanding, returned demonstration, verbal cues required, tactile cues required, and needs further education     HOME EXERCISE PROGRAM: Access Code: [email or web address] (cervical) URL: [email or web address] Date: 01/07/2024 Prepared by: Marko Molt  Exercises - Seated Cervical Retraction Protraction AROM  - 1 x daily - 7 x weekly - 2 sets - 10 reps - 3 sec hold - Seated Scapular Retraction  - 1 x daily - 7 x weekly - 2 sets - 10 reps - 3 sec hold - Seated Cervical Extension AROM  - 1 x daily - 7 x weekly - 2 sets - 10 reps - Supine Cervical Rotation AROM on Pillow  - 1 x daily - 7 x weekly - 1 sets - 15 reps    (lumbar) Access Code: [email or web address] URL: [email or web address] Date: 01/27/2024 Prepared by: Alm Jenny  Exercises - Seated Hip Adduction Isometrics with Mercer  - 2-3 x daily - 1 sets - 8 reps - Seated Hip Abduction with Resistance  - 2-3 x daily - 1 sets - 8 reps - Seated Cat Cow  - 2-3 x daily - 1 sets - 8 reps - Standing Anti-Rotation Press with Anchored Resistance  - 2-3 x daily - 1 sets - 8 reps - Supine Lower Trunk Rotation  - 2-3 x daily - 1 sets - 8 reps  ASSESSMENT:  CLINICAL IMPRESSION: 01/31/2024: Yuvaan had good tolerance of today's treatment session, continuing along current POC. He was able to tolerate increased resistance with seated chest press and OH reaching, however he had the most difficulty with this. Continued with lumbar mobility and functional strengthening  activities as tolerated. We will continue to progress per POC as tolerated, in order to reach established rehab goals.    Per eval - Victorio is a 73 y.o. male who was seen today for physical therapy evaluation and treatment for decreased Cervical Mobility related to L>R foraminal cervical stenosis from C2-C7. He is demonstrating decreased CS AROM and decreased postural endurance. His cervical rotation AROM improved from 35 degrees to 40 degrees bilaterally following cervical retraction today. He has related pain and difficulty with lifting, reading, driving and participation in recreational activities. He requires skilled PT services at this time to address relevant deficits and improve overall function.     OBJECTIVE IMPAIRMENTS: decreased ROM, impaired UE functional use, improper body mechanics, and postural dysfunction.   ACTIVITY LIMITATIONS: carrying and lifting  PARTICIPATION LIMITATIONS: driving, shopping, community activity, and yard work  PERSONAL FACTORS: Age, Past/current experiences, and 3+ comorbidities: Relevant PMHx includes aortic insuffiency with Ross procedure, CHF, DM type II, esophageal stricture (with reported dysphagia), Left brachial plexus neuropathy, OSA, neurogenic claudication, BIL carpal tunnel  are also affecting patient's functional outcome.   REHAB POTENTIAL: Fair    CLINICAL DECISION MAKING: Stable/uncomplicated  EVALUATION COMPLEXITY: Low   GOALS: Goals reviewed with patient? YES  SHORT TERM GOALS: Target date: 02/04/2024   Patient will be independent with initial home program at least 3 days/week.  Baseline: provided at eval Goal Status: INITIAL   2.  Patient will demonstrate improved postural awareness for at least 15 minutes while seated without need for cueing from PT.  Baseline: see objective measures Goal Status: INITIAL   3.  Patient will demonstrate improved CS AROM by at least 5 degrees in all directions  Baseline: see objective  measures Goal status: INITIAL   LONG TERM  GOALS: Target date: 03/03/2024   Patient will report improved overall functional ability with NDI score of 4/50 or less.  Baseline: 8/50 Goal Status: INITIAL    2.  Patient will report ability to turn his head while driving, with less reliance on trunk rotation for compensation.  Baseline: using trunk rotation d/t 35 degree active rotation  Goal status: INITIAL  3.  Patient will demonstrate improved CS AROM by at least 10 degrees in all directions  Baseline: see objective measures Goal status: INITIAL   4. Pt will perform 5x Sit to Stand in less than or equal to 9 sec without UE support in order to reduce fall risk and improve functional mobility.  Baseline: 12 sec, gentle UE support at thighs  Goal status: INITIAL/NEW 01/17/24  5. Pt will demonstrate at least 75% lumbar AROM in all planes in order to facilitate improved functional mobility.  Baseline: see ROM chart above Goal status: INITIAL/NEW 01/17/24  6. Pt will report ability to perform sitting/standing household tasks for >29min without increase in symptoms.  Baseline: reports 15-71min tolerance (dishes, workshop)  Goal status: INITIAL/New 01/17/24        PLAN:  PT FREQUENCY: 1-2x/week  PT DURATION: 8 weeks  PLANNED INTERVENTIONS: 97164- PT Re-evaluation, 97110-Therapeutic exercises, 97530- Therapeutic activity, 97112- Neuromuscular re-education, 97535- Self Care, [id number]- Manual therapy, G0283- Electrical stimulation (unattended), 02987- Traction (mechanical), [id number]- Ionotophoresis 4mg /ml Dexamethasone , Patient/Family education, Taping, Joint mobilization, Spinal manipulation, Spinal mobilization, Cryotherapy, and Moist heat  PLAN FOR NEXT SESSION: HEP review/update PRN. Continue working on global spinal mobility within pt tolerance, core stability. Pt wants to improve tolerance to stooped postures (both sitting and standing) and long term goal of working back into golfing. Use  of manual therapy and modalities as indicated    Marko Molt, PT, DPT  01/31/2024 12:16 PM

## 2024-02-01 ENCOUNTER — Ambulatory Visit (INDEPENDENT_AMBULATORY_CARE_PROVIDER_SITE_OTHER): Admitting: *Deleted

## 2024-02-01 VITALS — Ht 74.0 in | Wt 214.0 lb

## 2024-02-01 DIAGNOSIS — Z0000 Encounter for general adult medical examination without abnormal findings: Secondary | ICD-10-CM

## 2024-02-01 DIAGNOSIS — Z1211 Encounter for screening for malignant neoplasm of colon: Secondary | ICD-10-CM

## 2024-02-01 NOTE — Progress Notes (Signed)
 Subjective:   IHAN PAT is a 73 y.o. male who presents for Medicare Annual/Subsequent preventive examination.  Visit Complete: Virtual I connected with  Elsie JULIANNA Franks on 02/01/24 by a audio enabled telemedicine application and verified that I am speaking with the correct person using two identifiers.  Patient Location: Home  Provider Location: Home Office  I discussed the limitations of evaluation and management by telemedicine. The patient expressed understanding and agreed to proceed.  Vital Signs: Because this visit was a virtual/telehealth visit, some criteria may be missing or patient reported. Any vitals not documented were not able to be obtained and vitals that have been documented are patient reported.  Patient Medicare AWV questionnaire was completed by the patient on 01-29-2024; I have confirmed that all information answered by patient is correct and no changes since this date.  Cardiac Risk Factors include: advanced age (>16men, >52 women);male gender;obesity (BMI >30kg/m2);diabetes mellitus     Objective:    Today's Vitals   02/01/24 0857  Weight: 214 lb (97.1 kg)  Height: 6' 2 (1.88 m)   Body mass index is 27.48 kg/m.     02/01/2024    8:55 AM 01/07/2024   10:15 AM 09/09/2022   10:42 AM 03/26/2022    9:00 AM 03/02/2022    1:29 PM 07/17/2021    9:50 AM 04/19/2021    9:02 AM  Advanced Directives  Does Patient Have a Medical Advance Directive? Yes Yes Yes Yes No Yes Yes  Type of Estate agent of [employer] Power of [place];Living will Healthcare Power of [place];Living will Healthcare Power of [place];Living will  Healthcare Power of [place];Living will Living will;Healthcare Power of Attorney  Does patient want to make changes to medical advance directive?       No - Patient declined  Copy of Healthcare Power of Attorney in Chart? No - copy requested  No - copy requested   No - copy requested No - copy requested  Would patient  like information on creating a medical advance directive?     No - Patient declined      Current Medications (verified) Outpatient Encounter Medications as of 02/01/2024  Medication Sig   apixaban  (ELIQUIS ) 5 MG TABS tablet Take 1 tablet (5 mg total) by mouth 2 (two) times daily.   azelastine  (ASTELIN ) 0.1 % nasal spray Place 1 spray into both nostrils as needed for rhinitis. Use in each nostril as directed   cetirizine  (ZYRTEC ) 10 MG tablet TAKE 1 TABLET(10 MG) BY MOUTH DAILY   empagliflozin  (JARDIANCE ) 10 MG TABS tablet Take 1 tablet (10 mg total) by mouth daily before breakfast.   famotidine (PEPCID) 40 MG tablet Take 40 mg by mouth as needed for heartburn or indigestion.   finasteride  (PROSCAR ) 5 MG tablet TAKE 1 TABLET(5 MG) BY MOUTH DAILY   fluticasone  (FLONASE ) 50 MCG/ACT nasal spray Place 2 sprays into both nostrils as needed for allergies or rhinitis.   furosemide  (LASIX ) 20 MG tablet Take 1 tablet (20 mg total) by mouth every other day.   ipratropium (ATROVENT ) 0.06 % nasal spray Place 2 sprays into both nostrils daily.   losartan  (COZAAR ) 25 MG tablet Take 1 tablet (25 mg total) by mouth daily.   metoprolol  succinate (TOPROL -XL) 100 MG 24 hr tablet Take 1 tablet (100 mg total) by mouth 2 (two) times daily. Take with or immediately following a meal.   montelukast  (SINGULAIR ) 10 MG tablet Take 1 tablet (10 mg total) by mouth at bedtime.  pantoprazole  (PROTONIX ) 40 MG tablet Take 1 tablet (40 mg total) by mouth daily.   rosuvastatin  (CRESTOR ) 40 MG tablet TAKE 1 TABLET(40 MG) BY MOUTH DAILY   spironolactone  (ALDACTONE ) 25 MG tablet TAKE 1 TABLET(25 MG) BY MOUTH DAILY   No facility-administered encounter medications on file as of 02/01/2024.    Allergies (verified) Penicillins and Quinidine   History: Past Medical History:  Diagnosis Date   Acute gastritis    Aortic insufficiency    s/p Ross Procedure: AVR/Pulmonary autograft in aortic position & PVR with bioprosthetic PV 1994 in  [place]   Aortic root aneurysm    Atrial flutter (HCC)    history- no atrial flutter since cardioversion   CHF (congestive heart failure) (HCC)    Diabetes mellitus without complication (HCC)    type 2   Early satiety    Esophageal stricture    GERD (gastroesophageal reflux disease)    Headache syndrome 10/18/2018   Hiatal hernia    Hyperlipidemia    Neuropathy    left brachial plexus   OSA on CPAP    mild obstructive sleep apnea overall but moderate during REM sleep with REM AHI 16.6/h and overall AHI 6.6/h.  In auto CPAP from 4 to 15 cm H2O   Pain, dental    SCC (squamous cell carcinoma) 02/10/2023   right forearm   Past Surgical History:  Procedure Laterality Date   AORTIC VALVE REPLACEMENT  1994   CARDIOVERSION N/A 03/14/2020   Procedure: CARDIOVERSION;  Surgeon: Rolan Ezra RAMAN, MD;  Location: [facility] ENDOSCOPY;  Service: Cardiovascular;  Laterality: N/A;   ESOPHAGEAL MANOMETRY N/A 09/23/2022   Procedure: ESOPHAGEAL MANOMETRY (EM);  Surgeon: Shila Gustav GAILS, MD;  Location: [facility] ENDOSCOPY;  Service: Gastroenterology;  Laterality: N/A;   ESOPHAGOGASTRODUODENOSCOPY N/A 03/30/2022   Procedure: ESOPHAGOGASTRODUODENOSCOPY (EGD);  Surgeon: Shyrl Linnie KIDD, MD;  Location: [facility] OR;  Service: Thoracic;  Laterality: N/A;   GANGLION CYST EXCISION  JAN 2002   HIATAL HERNIA REPAIR  DEC 2002   INTERCOSTAL NERVE BLOCK Right 03/30/2022   Procedure: INTERCOSTAL NERVE BLOCK;  Surgeon: Shyrl Linnie KIDD, MD;  Location: [facility] OR;  Service: Thoracic;  Laterality: Right;   TEE WITHOUT CARDIOVERSION N/A 03/14/2020   Procedure: TRANSESOPHAGEAL ECHOCARDIOGRAM (TEE);  Surgeon: Rolan Ezra RAMAN, MD;  Location: [facility] ENDOSCOPY;  Service: Cardiovascular;  Laterality: N/A;   TONSILLECTOMY     VASECTOMY  DEC 2002   Family History  Problem Relation Age of Onset   Lung cancer Father        hx of smoking    Leukemia Brother        Passed at age of 56   Hypertension Other    Prostate cancer Other    Colon  cancer Neg Hx    Pancreatic cancer Neg Hx    Rectal cancer Neg Hx    Stomach cancer Neg Hx    Asthma Neg Hx    Immunodeficiency Neg Hx    Eczema Neg Hx    Atopy Neg Hx    Urticaria Neg Hx    Allergic rhinitis Neg Hx    Angioedema Neg Hx    Esophageal cancer Neg Hx    Migraines Neg Hx    Social History   Socioeconomic History   Marital status: Married    Spouse name: Comer    Number of children: 3   Years of education: Not on file   Highest education level: Master's degree (e.g., MA, MS, MEng, MEd, MSW, MBA)  Occupational  History   Occupation: retired  Tobacco Use   Smoking status: Never    Passive exposure: Current (wife smokes in car)   Smokeless tobacco: Never  Vaping Use   Vaping status: Never Used  Substance and Sexual Activity   Alcohol use: No   Drug use: No   Sexual activity: Yes  Other Topics Concern   Not on file  Social History Narrative   Caffeine 1-2 cups daily    Right handed   Live in [place] with spouse Comer    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   Social Drivers of Corporate investment banker Strain: Medium Risk (02/01/2024)   Overall Financial Resource Strain (CARDIA)    Difficulty of Paying Living Expenses: Somewhat hard  Food Insecurity: Food Insecurity Present (02/01/2024)   Hunger Vital Sign    Worried About Running Out of Food in the Last Year: Sometimes true    Ran Out of Food in the Last Year: Never true  Transportation Needs: No Transportation Needs (02/01/2024)   PRAPARE - Administrator, Civil Service (Medical): No    Lack of Transportation (Non-Medical): No  Physical Activity: Sufficiently Active (02/01/2024)   Exercise Vital Sign    Days of Exercise per Week: 3 days    Minutes of Exercise per Session: 90 min  Stress: No Stress Concern Present (02/01/2024)   [employer] of Occupational Health - Occupational Stress Questionnaire    Feeling of Stress: Not at all  Social Connections:  Moderately Integrated (02/01/2024)   Social Connection and Isolation Panel    Frequency of Communication with Friends and Family: Three times a week    Frequency of Social Gatherings with Friends and Family: Once a week    Attends Religious Services: 1 to 4 times per year    Active Member of [employer] or Organizations: No    Attends Engineer, structural: Not on file    Marital Status: Married    Tobacco Counseling Counseling given: Not Answered   Clinical Intake:  Pre-visit preparation completed: Yes  Pain : No/denies pain     Diabetes: Yes CBG done?: No Did pt. bring in CBG monitor from home?: No  How often do you need to have someone help you when you read instructions, pamphlets, or other written materials from your doctor or pharmacy?: 1 - Never  Interpreter Needed?: No  Information entered by :: Mliss Graff LPN   Activities of Daily Living    02/01/2024    9:13 AM 01/29/2024   10:22 AM  In your present state of health, do you have any difficulty performing the following activities:  Hearing? 0 0  Vision? 0 0  Difficulty concentrating or making decisions? 0 0  Walking or climbing stairs? 0 0  Dressing or bathing? 0 0  Doing errands, shopping? 0 0  Preparing Food and eating ? N N  Using the Toilet? N N  In the past six months, have you accidently leaked urine? N N  Do you have problems with loss of bowel control? N N  Managing your Medications? N N  Managing your Finances? N N  Housekeeping or managing your Housekeeping? N N    Patient Care Team: Mahlon Comer BRAVO, MD as PCP - General Rolan Ezra RAMAN, MD as PCP - Cardiology (Cardiology) Rolan Ezra RAMAN, MD as Consulting Physician (Cardiology) Army Dallas NOVAK, MD (Inactive) as Consulting Physician (Cardiothoracic Surgery) Alline Lenis, MD (Inactive) as Consulting Physician (Urology) [place],  Tawni CROME, MD as Referring Physician (Dermatology) Roark Rush, MD as Consulting Physician  (Otolaryngology)  Indicate any recent Medical Services you may have received from other than [facility] providers in the past year (date may be approximate).     Assessment:   This is a routine wellness examination for [place].  Hearing/Vision screen Hearing Screening - Comments:: No trouble hearing Vision Screening - Comments:: [facility]  Up to date   Goals Addressed             This Visit's Progress    Patient Stated       Golf more       Depression Screen    02/01/2024    8:57 AM 04/15/2023    8:56 AM 01/13/2023    9:05 AM 10/13/2022    9:14 AM 09/09/2022   10:39 AM 07/13/2022   10:22 AM 04/09/2022   10:14 AM  PHQ 2/9 Scores  PHQ - 2 Score 0 0 0 0 0 0 0  PHQ- 9 Score 0 0 0 0 0 0 6    Fall Risk    02/01/2024    8:56 AM 01/29/2024   10:22 AM 01/05/2024   10:14 AM 07/15/2023    9:16 AM 06/30/2023    9:47 AM  Fall Risk   Falls in the past year? 1 1 0 0 0  Number falls in past yr: 0  0 0 0  Injury with Fall? 0 0 0 0 0  Risk for fall due to :   No Fall Risks No Fall Risks No Fall Risks  Follow up Falls evaluation completed;Education provided;Falls prevention discussed   Falls evaluation completed Falls evaluation completed    MEDICARE RISK AT HOME: Medicare Risk at Home Any stairs in or around the home?: Yes If so, are there any without handrails?: No Home free of loose throw rugs in walkways, pet beds, electrical cords, etc?: Yes Adequate lighting in your home to reduce risk of falls?: Yes Life alert?: No Use of a cane, walker or w/c?: No Grab bars in the bathroom?: No Shower chair or bench in shower?: No Elevated toilet seat or a handicapped toilet?: Yes  TIMED UP AND GO:  Was the test performed?  No    Cognitive Function:        02/01/2024    8:56 AM 09/09/2022   10:42 AM  6CIT Screen  What Year? 0 points 0 points  What month? 0 points 0 points  What time?  0 points  Count back from 20 0 points 0 points  Months in reverse 0 points 0 points   Repeat phrase 0 points 0 points  Total Score  0 points    Immunizations Immunization History  Administered Date(s) Administered   Fluad Quad(high Dose 65+) 06/16/2019, 07/08/2020   Fluad Trivalent(High Dose 65+) 04/15/2023   Influenza Split 07/07/2011   Influenza Whole 05/10/2008, 05/29/2010   Influenza, High Dose Seasonal PF 06/14/2018   Influenza, Seasonal, Injecte, Preservative Fre 07/12/2012   Influenza,inj,Quad PF,6+ Mos 05/25/2013, 05/31/2014, 06/03/2015, 06/08/2016, 04/29/2017   Influenza-Unspecified 03/03/2021   Moderna Sars-Covid-2 Vaccination 09/25/2019, 10/24/2019, 07/01/2020   Pneumococcal Conjugate-13 06/14/2018   Pneumococcal Polysaccharide-23 06/16/2019   Tdap 07/12/2012   Zoster Recombinant(Shingrix) 09/08/2012, 07/30/2022   Zoster, Live 09/08/2012    TDAP status: Due, Education has been provided regarding the importance of this vaccine. Advised may receive this vaccine at local pharmacy or Health Dept. Aware to provide a copy of the vaccination record if obtained from local  pharmacy or Health Dept. Verbalized acceptance and understanding.  Flu Vaccine status: Up to date  Pneumococcal vaccine status: Up to date  Covid-19 vaccine status: Information provided on how to obtain vaccines.   Qualifies for Shingles Vaccine? No   Zostavax completed Yes   Shingrix Completed?: Yes  Screening Tests Health Maintenance  Topic Date Due   Diabetic kidney evaluation - Urine ACR  Never done   DTaP/Tdap/Td (2 - Td or Tdap) 07/12/2022   Colonoscopy  05/14/2024   INFLUENZA VACCINE  03/03/2024   FOOT EXAM  04/14/2024   OPHTHALMOLOGY EXAM  04/28/2024   HEMOGLOBIN A1C  07/07/2024   Diabetic kidney evaluation - eGFR measurement  01/05/2025   Medicare Annual Wellness (AWV)  01/31/2025   Pneumococcal Vaccine: 50+ Years  Completed   Hepatitis C Screening  Completed   Zoster Vaccines- Shingrix  Completed   Hepatitis B Vaccines  Aged Out   HPV VACCINES  Aged Out    Meningococcal B Vaccine  Aged Out   COVID-19 Vaccine  Discontinued    Health Maintenance  Health Maintenance Due  Topic Date Due   Diabetic kidney evaluation - Urine ACR  Never done   DTaP/Tdap/Td (2 - Td or Tdap) 07/12/2022   Colonoscopy  05/14/2024    Colorectal cancer screening: Referral to GI placed  . Pt aware the office will call re: appt.  Lung Cancer Screening: (Low Dose CT Chest recommended if Age 72-80 years, 20 pack-year currently smoking OR have quit w/in 15years.) does not qualify.   Lung Cancer Screening Referral:   Additional Screening:  Hepatitis C Screening: does not qualify; Completed 2022  Vision Screening: Recommended annual ophthalmology exams for early detection of glaucoma and other disorders of the eye. Is the patient up to date with their annual eye exam?  Yes  Who is the provider or what is the name of the office in which the patient attends annual eye exams? [facility]  If pt is not established with a provider, would they like to be referred to a provider to establish care? No .   Dental Screening: Recommended annual dental exams for proper oral hygiene.  Nutrition Risk Assessment:  Has the patient had any N/V/D within the last 2 months?  No  Does the patient have any non-healing wounds?  No  Has the patient had any unintentional weight loss or weight gain?  No   Diabetes:  Is the patient diabetic?  Yes  If diabetic, was a CBG obtained today?  No  Did the patient bring in their glucometer from home?  No  How often do you monitor your CBG's? Checks at office visits.   Financial Strains and Diabetes Management:  Are you having any financial strains with the device, your supplies or your medication? No .  Does the patient want to be seen by Chronic Care Management for management of their diabetes?  No  Would the patient like to be referred to a Nutritionist or for Diabetic Management?  No   Diabetic Exams:  Diabetic Eye Exam:  Completed Pt has been advised about the importance in completing this exam.  Diabetic Foot Exam: . Pt has been advised about the importance in completing this exam..    Community Resource Referral / Chronic Care Management: CRR required this visit?  No   CCM required this visit?  No     Plan:     I have personally reviewed and noted the following in the patient's chart:   Medical  and social history Use of alcohol, tobacco or illicit drugs  Current medications and supplements including opioid prescriptions. Patient is currently taking opioid prescriptions. Information provided to patient regarding non-opioid alternatives. Patient advised to discuss non-opioid treatment plan with their provider. Functional ability and status Nutritional status Physical activity Advanced directives List of other physicians Hospitalizations, surgeries, and ER visits in previous 12 months Vitals Screenings to include cognitive, depression, and falls Referrals and appointments  In addition, I have reviewed and discussed with patient certain preventive protocols, quality metrics, and best practice recommendations. A written personalized care plan for preventive services as well as general preventive health recommendations were provided to patient.     Mliss Graff, LPN   09/10/7972   After Visit Summary: (MyChart) Due to this being a telephonic visit, the after visit summary with patients personalized plan was offered to patient via MyChart   Nurse Notes:

## 2024-02-01 NOTE — Therapy (Signed)
 OUTPATIENT PHYSICAL THERAPY TREATMENT   Patient Name: KOBEY SIDES MRN: [id number] DOB:June 14, 1951, 73 y.o., male Today's Date: 02/01/2024  END OF SESSION:         Past Medical History:  Diagnosis Date   Acute gastritis    Aortic insufficiency    s/p Ross Procedure: AVR/Pulmonary autograft in aortic position & PVR with bioprosthetic PV 1994 in [place]   Aortic root aneurysm    Atrial flutter (HCC)    history- no atrial flutter since cardioversion   CHF (congestive heart failure) (HCC)    Diabetes mellitus without complication (HCC)    type 2   Early satiety    Esophageal stricture    GERD (gastroesophageal reflux disease)    Headache syndrome 10/18/2018   Hiatal hernia    Hyperlipidemia    Neuropathy    left brachial plexus   OSA on CPAP    mild obstructive sleep apnea overall but moderate during REM sleep with REM AHI 16.6/h and overall AHI 6.6/h.  In auto CPAP from 4 to 15 cm H2O   Pain, dental    SCC (squamous cell carcinoma) 02/10/2023   right forearm   Past Surgical History:  Procedure Laterality Date   AORTIC VALVE REPLACEMENT  1994   CARDIOVERSION N/A 03/14/2020   Procedure: CARDIOVERSION;  Surgeon: Rolan Ezra RAMAN, MD;  Location: [facility] ENDOSCOPY;  Service: Cardiovascular;  Laterality: N/A;   ESOPHAGEAL MANOMETRY N/A 09/23/2022   Procedure: ESOPHAGEAL MANOMETRY (EM);  Surgeon: Shila Gustav GAILS, MD;  Location: [facility] ENDOSCOPY;  Service: Gastroenterology;  Laterality: N/A;   ESOPHAGOGASTRODUODENOSCOPY N/A 03/30/2022   Procedure: ESOPHAGOGASTRODUODENOSCOPY (EGD);  Surgeon: Shyrl Linnie KIDD, MD;  Location: [facility] OR;  Service: Thoracic;  Laterality: N/A;   GANGLION CYST EXCISION  JAN 2002   HIATAL HERNIA REPAIR  DEC 2002   INTERCOSTAL NERVE BLOCK Right 03/30/2022   Procedure: INTERCOSTAL NERVE BLOCK;  Surgeon: Shyrl Linnie KIDD, MD;  Location: [facility] OR;  Service: Thoracic;  Laterality: Right;   TEE WITHOUT CARDIOVERSION N/A 03/14/2020   Procedure:  TRANSESOPHAGEAL ECHOCARDIOGRAM (TEE);  Surgeon: Rolan Ezra RAMAN, MD;  Location: [facility] ENDOSCOPY;  Service: Cardiovascular;  Laterality: N/A;   TONSILLECTOMY     VASECTOMY  DEC 2002   Patient Active Problem List   Diagnosis Date Noted   Benign neoplasm of skin of lower limb 01/05/2024   History of neoplasm 01/05/2024   Lentigo 01/05/2024   Neurogenic claudication 01/05/2024   Peripheral venous insufficiency 01/05/2024   Seborrheic keratosis 01/05/2024   [name] 01/06/2023   Dysphagia 10/29/2022   Refractory chronic cough 07/22/2022   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   Aortic atherosclerosis (HCC) 01/19/2022   Other allergic rhinitis 12/31/2021   Degenerative disc disease, lumbar 04/04/2021   Abdominal bloating 12/14/2019   Controlled diabetes mellitus type 2 with complications (HCC) 12/14/2019   Headache syndrome 10/18/2018   Laryngopharyngeal reflux (LPR) 08/10/2016   BPH (benign prostatic hyperplasia) 06/03/2015   H/O Ross procedure 01/31/2015   Thoracic aortic aneurysm (HCC) 12/11/2013   Ganglion cyst 06/01/2013   Brachial plexus neuropathy 10/07/2011   Cervical disc disease 10/07/2011   General medical examination 03/11/2011   Carpopedal spasm 02/25/2011   Arthritis of hand 02/25/2011   CARPAL TUNNEL SYNDROME, BILATERAL 06/10/2009   AORTIC VALVE REPLACEMENT, HX OF 06/10/2009   Hyperlipidemia 08/08/2008   Essential hypertension 08/08/2008   ESOPHAGEAL STRICTURE 03/29/2003   GERD 03/29/2003   HIATAL HERNIA 10/21/2000    PCP:  Mahlon Comer BRAVO, MD   REFERRING PROVIDER:  Mahlon  Comer BRAVO, MD   REFERRING DIAG:  F52.77 (ICD-10-CM) - Cervical spondylosis with radiculopathy   New referral from PCP 01/09/24:  M51.360 (ICD-10-CM) - Degeneration of intervertebral disc of lumbar region with discogenic back pain  THERAPY DIAG:  No diagnosis found.  Rationale for Evaluation and Treatment: Rehabilitation  ONSET DATE: 2 years   SUBJECTIVE:                                                                                                                                                                                                         Per eval - Patient reports to PT with primary concern of stiffness in the neck. He states that he is not having any pain. He did have headaches that were alleviated with use of CPAP. He reports history of L brachial plexus injury that has significantly decreased his shoulder mobility.  Back subjective 01/17/24: States back has been bothering him all my life. States as a 73-74 year old fell off a horse and he wasn't able to move  for a couple days, isn't sure if that contributed to pain. States his pain is more of a weakness, low central>left, top of hip. States sometimes it will go into LLE but this will be resolved by orthotics in shoes, very infrequent. Tends to run posterolateral into calf. Has trouble balancing on L. Used to enjoy golfing but hasn't played in a couple years due to weakness/stiffness. Denies any recent acute worsening. States he has had imaging but it's been a while. Standing/sitting with stooped posture 15-20 min before needing a break.   Hand dominance: Right  SUBJECTIVE STATEMENT:  02/01/2024: ***  *** Patient reporting that he feels PT has been very helpful. Continues to have mild posterior thigh pain. He is working on the majority of his HEP daily, but less frequently performs sciatic nerve glide and seated cat cow.     PERTINENT HISTORY:  Relevant PMHx includes aortic insuffiency with Ross procedure, CHF, DM type II, esophageal stricture (with reported dysphagia), Left brachial plexus neuropathy, OSA, neurogenic claudication, BIL carpal tunnel   PAIN:  Are you having pain?  No  PRECAUTIONS: None  RED FLAGS: None  01/17/24: no BIL LE symptoms, no buckling, no saddle anesthesia, no bowel/bladder    WEIGHT BEARING RESTRICTIONS: No  FALLS:  Has patient fallen in last 6  months? No  LIVING ENVIRONMENT: Lives with: lives with their spouse Lives in: House/apartment Stairs: yes, indoor and outdoor with railings  Has following equipment at home: None  OCCUPATION: Retired  PLOF: Independent and Leisure: [place]  PATIENT GOALS: To be able to turn my neck, especially in traffic.  01/17/24: try to get back to golfing, lower body dressing, transfers after sitting  NEXT MD VISIT: 04/06/2024 to referring provider   OBJECTIVE:  Note: Objective measures were completed at Evaluation unless otherwise noted.  DIAGNOSTIC FINDINGS:   11/03/2023 MRI Cervical Spine   IMPRESSION: 1. C2-3: Mild bilateral foraminal narrowing, not compressive. 2. C3-4: Bilateral foraminal stenosis left worse than right. Either C4 nerve could be affected, more likely the left. 3. C4-5: Bilateral foraminal stenosis that could affect either or both C5 nerves. 4. C5-6: Bilateral foraminal stenosis that could affect either or both C6 nerves. 5. C6-7: Bilateral foraminal stenosis that could affect either or both C7 nerves. 6. Canal stenosis most notable at C3-4 and C4-5 but without gross compressive effect upon the spinal cord.    PATIENT SURVEYS:  NDI 8/50 LEFS 18/80  COGNITION: Overall cognitive status: Within functional limits for tasks assessed  SENSATION: Not tested  POSTURE: rounded shoulders, forward head, and increased thoracic kyphosis  PALPATION: No tenderness to palpation    CERVICAL ROM:   Active ROM A/PROM (deg) eval  Flexion 30  Extension 10  Right lateral flexion 30  Left lateral flexion 30  Right rotation 35  Left rotation 35   (Blank rows = not tested)  UPPER EXTREMITY ROM:  Active ROM Right eval Left eval  Shoulder flexion WFL   Shoulder extension    Shoulder abduction WFL    Shoulder adduction    Shoulder extension    Shoulder internal rotation    Shoulder external rotation    Elbow flexion    Elbow extension    Wrist flexion    Wrist  extension    Wrist ulnar deviation    Wrist radial deviation    Wrist pronation    Wrist supination     (Blank rows = not tested)  UPPER EXTREMITY MMT:  MMT Right eval Left eval  Shoulder flexion    Shoulder extension    Shoulder abduction    Shoulder adduction    Shoulder extension    Shoulder internal rotation    Shoulder external rotation    Middle trapezius    Lower trapezius    Elbow flexion    Elbow extension    Wrist flexion    Wrist extension    Wrist ulnar deviation    Wrist radial deviation    Wrist pronation    Wrist supination    Grip strength     (Blank rows = not tested)  CERVICAL SPECIAL TESTS:  Deferred for future visits as indicated  FUNCTIONAL TESTS:  Deferred for future visits as indicated    01/17/24 LUMBAR EXAM:  LUMBAR ROM:    A/PROM  01/17/24  Flexion Distal shin w/ stiffness, hamstring tightness  Extension 25% s  Right lateral flexion Just above knee, s  Left lateral flexion Mid thigh, s  Right rotation 25% s  Left rotation 25% s   (Blank rows = not tested) (Key: WFL = within functional limits not formally assessed, * = concordant pain, s = stiffness/stretching sensation, NT = not tested) Comments:    LOWER EXTREMITY MMT:    MMT Right 01/17/24 Left 01/17/24  Hip flexion 4 4  Hip abduction (modified sitting) 5 5  Hip internal rotation 4+ 4  Hip external rotation 4+ 4  Knee flexion 5 4  Knee extension 5 5  Ankle dorsiflexion 5 5   (Blank rows = not tested) (  Key: WFL = within functional limits not formally assessed, * = concordant pain, s = stiffness/stretching sensation, NT = not tested)  Comments:     FUNCTIONAL TEST: 5xSTS: 12.48 sec gentle UE support from thighs, provokes back weakness    TREATMENT DATE:   [facility] Adult PT Treatment:                                                DATE: 02/02/24 Therapeutic Exercise: *** Manual Therapy: *** Neuromuscular re-ed: *** Therapeutic  Activity: *** Modalities: *** Self Care: ***    [facility] Adult PT Treatment:                                                DATE: 01/31/2024  Therapeutic Exercise: Seated cat/camel x12 cues for reduced compensations Supine (wedge) LTR 2x6 BIL cues for comfortable ROM, relaxing head, and breath control (2nd round with cervical rotation to CL side) HEP update + education/handout  Neuromuscular re-ed: Dynadisc Green band paloff press 2x11 BIL cues for pacing and core engagement  Seated dynadisc fwd chest press +OH press hands clasped x10  Sciatic nerve glide x 10   Therapeutic Activity: Front carry 10# KB 3x17ft cues for posture  STS + foam roll pushdown 2x10 cues for posture and pacing   [facility] Adult PT Treatment:                                                DATE: 01/27/24 Therapeutic Exercise: Seated cat/camel x12 cues for reduced compensations Supine (wedge) LTR 2x5 BIL cues for comfortable ROM, relaxing head, and breath control  HEP update + education/handout  Neuromuscular re-ed: Green band paloff press 2x8 BIL cues for pacing and core engagement  Seated dynadisc fwd chest press +OH press hands clasped x10  Therapeutic Activity: Front carry 10# KB 3x7ft cues for posture  STS + foam roll pushdown 2x10 cues for posture and pacing  Education/discussion re: activity modification/progression, relevant anatomy/physiology and rationale for interventions    [facility] Adult PT Treatment:                                                DATE: 01/25/24 Therapeutic Exercise: Supine hamstring stretch w/ strap (wedge for comfort) x8 BIL Standing hip ext x12 alternating cues for posture Standing hip abd x8 BIL alt  Red band row 2x8 Supine cervical rotation AROM 2x8 BIL HEP discussion/education  Therapeutic Activity: MSK assessment, negative slump test BIL  Seated sciatic nerve glide LLE to assess symptom modification (no significant change)  Mini side lunge w/ UE support x8 BIL cues to  limit truncal rotation Mini reverse lunge w/ UE support x8 BIL cues for posture   PATIENT EDUCATION:  Education details: rationale for interventions, HEP  Person educated: Patient Education method: Explanation, Demonstration, Tactile cues, Verbal cues Education comprehension: verbalized understanding, returned demonstration, verbal cues required, tactile cues required, and needs further education     HOME EXERCISE PROGRAM: Access  Code: [email or web address] (cervical) URL: [email or web address] Date: 01/07/2024 Prepared by: Marko Molt  Exercises - Seated Cervical Retraction Protraction AROM  - 1 x daily - 7 x weekly - 2 sets - 10 reps - 3 sec hold - Seated Scapular Retraction  - 1 x daily - 7 x weekly - 2 sets - 10 reps - 3 sec hold - Seated Cervical Extension AROM  - 1 x daily - 7 x weekly - 2 sets - 10 reps - Supine Cervical Rotation AROM on Pillow  - 1 x daily - 7 x weekly - 1 sets - 15 reps    (lumbar) Access Code: [email or web address] URL: [email or web address] Date: 01/27/2024 Prepared by: Alm Jenny  Exercises - Seated Hip Adduction Isometrics with Mercer  - 2-3 x daily - 1 sets - 8 reps - Seated Hip Abduction with Resistance  - 2-3 x daily - 1 sets - 8 reps - Seated Cat Cow  - 2-3 x daily - 1 sets - 8 reps - Standing Anti-Rotation Press with Anchored Resistance  - 2-3 x daily - 1 sets - 8 reps - Supine Lower Trunk Rotation  - 2-3 x daily - 1 sets - 8 reps  ASSESSMENT:  CLINICAL IMPRESSION: 02/01/2024: ***  *** Yehia had good tolerance of today's treatment session, continuing along current POC. He was able to tolerate increased resistance with seated chest press and OH reaching, however he had the most difficulty with this. Continued with lumbar mobility and functional strengthening activities as tolerated. We will continue to progress per POC as tolerated, in order to reach established rehab goals.    Per eval - Keithen is a 73 y.o. male who was seen today  for physical therapy evaluation and treatment for decreased Cervical Mobility related to L>R foraminal cervical stenosis from C2-C7. He is demonstrating decreased CS AROM and decreased postural endurance. His cervical rotation AROM improved from 35 degrees to 40 degrees bilaterally following cervical retraction today. He has related pain and difficulty with lifting, reading, driving and participation in recreational activities. He requires skilled PT services at this time to address relevant deficits and improve overall function.     OBJECTIVE IMPAIRMENTS: decreased ROM, impaired UE functional use, improper body mechanics, and postural dysfunction.   ACTIVITY LIMITATIONS: carrying and lifting  PARTICIPATION LIMITATIONS: driving, shopping, community activity, and yard work  PERSONAL FACTORS: Age, Past/current experiences, and 3+ comorbidities: Relevant PMHx includes aortic insuffiency with Ross procedure, CHF, DM type II, esophageal stricture (with reported dysphagia), Left brachial plexus neuropathy, OSA, neurogenic claudication, BIL carpal tunnel  are also affecting patient's functional outcome.   REHAB POTENTIAL: Fair    CLINICAL DECISION MAKING: Stable/uncomplicated  EVALUATION COMPLEXITY: Low   GOALS: Goals reviewed with patient? YES  SHORT TERM GOALS: Target date: 02/04/2024   Patient will be independent with initial home program at least 3 days/week.  Baseline: provided at eval 02/02/24: *** Goal Status: ***  2.  Patient will demonstrate improved postural awareness for at least 15 minutes while seated without need for cueing from PT.  Baseline: see objective measures 02/02/24: *** Goal Status: ***  3.  Patient will demonstrate improved CS AROM by at least 5 degrees in all directions  Baseline: see objective measures 02/02/24: *** Goal Status: ***   LONG TERM GOALS: Target date: 03/03/2024   Patient will report improved overall functional ability with NDI score of 4/50 or less.   Baseline: 8/50 Goal Status: INITIAL    2.  Patient will report ability to turn his  head while driving, with less reliance on trunk rotation for compensation.  Baseline: using trunk rotation d/t 35 degree active rotation  Goal status: INITIAL  3.  Patient will demonstrate improved CS AROM by at least 10 degrees in all directions  Baseline: see objective measures Goal status: INITIAL   4. Pt will perform 5x Sit to Stand in less than or equal to 9 sec without UE support in order to reduce fall risk and improve functional mobility.  Baseline: 12 sec, gentle UE support at thighs  Goal status: INITIAL/NEW 01/17/24  5. Pt will demonstrate at least 75% lumbar AROM in all planes in order to facilitate improved functional mobility.  Baseline: see ROM chart above Goal status: INITIAL/NEW 01/17/24  6. Pt will report ability to perform sitting/standing household tasks for >88min without increase in symptoms.  Baseline: reports 15-75min tolerance (dishes, workshop)  Goal status: INITIAL/New 01/17/24        PLAN:  PT FREQUENCY: 1-2x/week  PT DURATION: 8 weeks  PLANNED INTERVENTIONS: 97164- PT Re-evaluation, 97110-Therapeutic exercises, 97530- Therapeutic activity, 97112- Neuromuscular re-education, 97535- Self Care, [id number]- Manual therapy, [id number]- Electrical stimulation (unattended), 02987- Traction (mechanical), [id number]- Ionotophoresis 4mg /ml Dexamethasone , Patient/Family education, Taping, Joint mobilization, Spinal manipulation, Spinal mobilization, Cryotherapy, and Moist heat  PLAN FOR NEXT SESSION: HEP review/update PRN. Continue working on global spinal mobility within pt tolerance, core stability. Pt wants to improve tolerance to stooped postures (both sitting and standing) and long term goal of working back into golfing. Use of manual therapy and modalities as indicated    Alm DELENA Jenny PT, DPT 02/01/2024 8:13 AM

## 2024-02-01 NOTE — Patient Instructions (Signed)
 [name] , Thank you for taking time to come for your Medicare Wellness Visit. I appreciate your ongoing commitment to your health goals. Please review the following plan we discussed and let me know if I can assist you in the future.   Screening recommendations/referrals: Colonoscopy: Education provided Recommended yearly ophthalmology/optometry visit for glaucoma screening and checkup Recommended yearly dental visit for hygiene and checkup  Vaccinations: Influenza vaccine: up to date Pneumococcal vaccine: up to date Tdap vaccine: Education provided Shingles vaccine: up to date      Preventive Care 65 Years and Older, Male Preventive care refers to lifestyle choices and visits with your health care provider that can promote health and wellness. What does preventive care include? A yearly physical exam. This is also called an annual well check. Dental exams once or twice a year. Routine eye exams. Ask your health care provider how often you should have your eyes checked. Personal lifestyle choices, including: Daily care of your teeth and gums. Regular physical activity. Eating a healthy diet. Avoiding tobacco and drug use. Limiting alcohol use. Practicing safe sex. Taking low doses of aspirin every day. Taking vitamin and mineral supplements as recommended by your health care provider. What happens during an annual well check? The services and screenings done by your health care provider during your annual well check will depend on your age, overall health, lifestyle risk factors, and family history of disease. Counseling  Your health care provider may ask you questions about your: Alcohol use. Tobacco use. Drug use. Emotional well-being. Home and relationship well-being. Sexual activity. Eating habits. History of falls. Memory and ability to understand (cognition). Work and work Astronomer. Screening  You may have the following tests or measurements: Height, weight,  and BMI. Blood pressure. Lipid and cholesterol levels. These may be checked every 5 years, or more frequently if you are over 40 years old. Skin check. Lung cancer screening. You may have this screening every year starting at age 34 if you have a 30-pack-year history of smoking and currently smoke or have quit within the past 15 years. Fecal occult blood test (FOBT) of the stool. You may have this test every year starting at age 64. Flexible sigmoidoscopy or colonoscopy. You may have a sigmoidoscopy every 5 years or a colonoscopy every 10 years starting at age 72. Prostate cancer screening. Recommendations will vary depending on your family history and other risks. Hepatitis C blood test. Hepatitis B blood test. Sexually transmitted disease (STD) testing. Diabetes screening. This is done by checking your blood sugar (glucose) after you have not eaten for a while (fasting). You may have this done every 1-3 years. Abdominal aortic aneurysm (AAA) screening. You may need this if you are a current or former smoker. Osteoporosis. You may be screened starting at age 52 if you are at high risk. Talk with your health care provider about your test results, treatment options, and if necessary, the need for more tests. Vaccines  Your health care provider may recommend certain vaccines, such as: Influenza vaccine. This is recommended every year. Tetanus, diphtheria, and acellular pertussis (Tdap, Td) vaccine. You may need a Td booster every 10 years. Zoster vaccine. You may need this after age 42. Pneumococcal 13-valent conjugate (PCV13) vaccine. One dose is recommended after age 49. Pneumococcal polysaccharide (PPSV23) vaccine. One dose is recommended after age 23. Talk to your health care provider about which screenings and vaccines you need and how often you need them. This information is not intended to replace  advice given to you by your health care provider. Make sure you discuss any questions you  have with your health care provider. Document Released: 08/16/2015 Document Revised: 04/08/2016 Document Reviewed: 05/21/2015 Elsevier Interactive Patient Education  2017 [employer].  Fall Prevention in the Home Falls can cause injuries. They can happen to people of all ages. There are many things you can do to make your home safe and to help prevent falls. What can I do on the outside of my home? Regularly fix the edges of walkways and driveways and fix any cracks. Remove anything that might make you trip as you walk through a door, such as a raised step or threshold. Trim any bushes or trees on the path to your home. Use bright outdoor lighting. Clear any walking paths of anything that might make someone trip, such as rocks or tools. Regularly check to see if handrails are loose or broken. Make sure that both sides of any steps have handrails. Any raised decks and porches should have guardrails on the edges. Have any leaves, snow, or ice cleared regularly. Use sand or salt on walking paths during winter. Clean up any spills in your garage right away. This includes oil or grease spills. What can I do in the bathroom? Use night lights. Install grab bars by the toilet and in the tub and shower. Do not use towel bars as grab bars. Use non-skid mats or decals in the tub or shower. If you need to sit down in the shower, use a plastic, non-slip stool. Keep the floor dry. Clean up any water that spills on the floor as soon as it happens. Remove soap buildup in the tub or shower regularly. Attach bath mats securely with double-sided non-slip rug tape. Do not have throw rugs and other things on the floor that can make you trip. What can I do in the bedroom? Use night lights. Make sure that you have a light by your bed that is easy to reach. Do not use any sheets or blankets that are too big for your bed. They should not hang down onto the floor. Have a firm chair that has side arms. You can  use this for support while you get dressed. Do not have throw rugs and other things on the floor that can make you trip. What can I do in the kitchen? Clean up any spills right away. Avoid walking on wet floors. Keep items that you use a lot in easy-to-reach places. If you need to reach something above you, use a strong step stool that has a grab bar. Keep electrical cords out of the way. Do not use floor polish or wax that makes floors slippery. If you must use wax, use non-skid floor wax. Do not have throw rugs and other things on the floor that can make you trip. What can I do with my stairs? Do not leave any items on the stairs. Make sure that there are handrails on both sides of the stairs and use them. Fix handrails that are broken or loose. Make sure that handrails are as long as the stairways. Check any carpeting to make sure that it is firmly attached to the stairs. Fix any carpet that is loose or worn. Avoid having throw rugs at the top or bottom of the stairs. If you do have throw rugs, attach them to the floor with carpet tape. Make sure that you have a light switch at the top of the stairs and the bottom  of the stairs. If you do not have them, ask someone to add them for you. What else can I do to help prevent falls? Wear shoes that: Do not have high heels. Have rubber bottoms. Are comfortable and fit you well. Are closed at the toe. Do not wear sandals. If you use a stepladder: Make sure that it is fully opened. Do not climb a closed stepladder. Make sure that both sides of the stepladder are locked into place. Ask someone to hold it for you, if possible. Clearly mark and make sure that you can see: Any grab bars or handrails. First and last steps. Where the edge of each step is. Use tools that help you move around (mobility aids) if they are needed. These include: Canes. Walkers. Scooters. Crutches. Turn on the lights when you go into a dark area. Replace any light  bulbs as soon as they burn out. Set up your furniture so you have a clear path. Avoid moving your furniture around. If any of your floors are uneven, fix them. If there are any pets around you, be aware of where they are. Review your medicines with your doctor. Some medicines can make you feel dizzy. This can increase your chance of falling. Ask your doctor what other things that you can do to help prevent falls. This information is not intended to replace advice given to you by your health care provider. Make sure you discuss any questions you have with your health care provider. Document Released: 05/16/2009 Document Revised: 12/26/2015 Document Reviewed: 08/24/2014 Elsevier Interactive Patient Education  2017 [employer].

## 2024-02-02 ENCOUNTER — Encounter: Payer: Self-pay | Admitting: Physical Therapy

## 2024-02-02 ENCOUNTER — Ambulatory Visit: Attending: Family Medicine | Admitting: Physical Therapy

## 2024-02-02 DIAGNOSIS — R293 Abnormal posture: Secondary | ICD-10-CM | POA: Insufficient documentation

## 2024-02-02 DIAGNOSIS — M6281 Muscle weakness (generalized): Secondary | ICD-10-CM | POA: Insufficient documentation

## 2024-02-03 ENCOUNTER — Encounter: Payer: Self-pay | Admitting: Family Medicine

## 2024-02-03 DIAGNOSIS — Z1211 Encounter for screening for malignant neoplasm of colon: Secondary | ICD-10-CM

## 2024-02-06 ENCOUNTER — Other Ambulatory Visit: Payer: Self-pay | Admitting: Gastroenterology

## 2024-02-06 ENCOUNTER — Other Ambulatory Visit: Payer: Self-pay | Admitting: Family Medicine

## 2024-02-06 DIAGNOSIS — G540 Brachial plexus disorders: Secondary | ICD-10-CM

## 2024-02-06 DIAGNOSIS — K219 Gastro-esophageal reflux disease without esophagitis: Secondary | ICD-10-CM

## 2024-02-14 DIAGNOSIS — G4733 Obstructive sleep apnea (adult) (pediatric): Secondary | ICD-10-CM | POA: Diagnosis not present

## 2024-03-06 ENCOUNTER — Telehealth (HOSPITAL_COMMUNITY): Payer: Self-pay

## 2024-03-06 ENCOUNTER — Encounter: Payer: Self-pay | Admitting: Gastroenterology

## 2024-03-06 NOTE — Telephone Encounter (Signed)
 Dr Shila the pt has a recent history with [facility] (May 2025)  do you want him to return to [facility] or can he be seen by you for colon?

## 2024-03-06 NOTE — Telephone Encounter (Signed)
 [facility] Patient Advocate Encounter  Medication Samples have been left at registration desk for patient pick up. Drug name: Eliquis  5 MG Qty: 4x 14 ct packages LOT: [id number] Exp.: 01/2025 SIG: Take 1 tablet by mouth twice daily   The patient has been instructed regarding the correct time, dose, and frequency of taking this medication, including desired effects and most common side effects.   Rachel DEL, CPhT Rx Patient Advocate Phone: [phone]

## 2024-03-10 DIAGNOSIS — H01001 Unspecified blepharitis right upper eyelid: Secondary | ICD-10-CM | POA: Diagnosis not present

## 2024-03-10 DIAGNOSIS — H00011 Hordeolum externum right upper eyelid: Secondary | ICD-10-CM | POA: Diagnosis not present

## 2024-03-16 DIAGNOSIS — G4733 Obstructive sleep apnea (adult) (pediatric): Secondary | ICD-10-CM | POA: Diagnosis not present

## 2024-03-24 ENCOUNTER — Telehealth (HOSPITAL_COMMUNITY): Payer: Self-pay

## 2024-03-24 NOTE — Telephone Encounter (Signed)
 Called to confirm/remind patient of their appointment at the [facility] on 03/27/24.   Appointment:   [] Confirmed  [x] Left mess   [] No answer/No voice mail  [] VM Full/unable to leave message  [] Phone not in service  And to bring in all medications and/or complete list.

## 2024-03-27 ENCOUNTER — Ambulatory Visit (HOSPITAL_COMMUNITY)
Admission: RE | Admit: 2024-03-27 | Discharge: 2024-03-27 | Disposition: A | Discharge status: Home / Self Care | Payer: PPO | Source: Ambulatory Visit | Attending: Adult Health | Admitting: Adult Health

## 2024-03-27 ENCOUNTER — Encounter (HOSPITAL_COMMUNITY): Payer: Self-pay

## 2024-03-27 VITALS — BP 122/80 | HR 60 | Ht 74.0 in | Wt 219.6 lb

## 2024-03-27 DIAGNOSIS — I10 Essential (primary) hypertension: Secondary | ICD-10-CM | POA: Diagnosis not present

## 2024-03-27 DIAGNOSIS — G4733 Obstructive sleep apnea (adult) (pediatric): Secondary | ICD-10-CM

## 2024-03-27 DIAGNOSIS — I5022 Chronic systolic (congestive) heart failure: Secondary | ICD-10-CM | POA: Diagnosis not present

## 2024-03-27 NOTE — Patient Instructions (Signed)
 Medication Changes:  No Changes In Medications at this time.   ECHOCARDIOGRAM AS SCHEDULED IN DECEMBER   Follow-Up in: 4 MONS WITH DR. MCLEAN--PLEASE CALL OUR OFFICE AROUND OCTOBER TO GET SCHEDULED FOR YOUR APPOINTMENT. PHONE NUMBER IS [phone] OPTION 2   At the [facility], you and your health needs are our priority. We have a designated team specialized in the treatment of Heart Failure. This Care Team includes your primary Heart Failure Specialized Cardiologist (physician), Advanced Practice Providers (APPs- Physician Assistants and Nurse Practitioners), and Pharmacist who all work together to provide you with the care you need, when you need it.   You may see any of the following providers on your designated Care Team at your next follow up:  Dr. Toribio Fuel Dr. Ezra Shuck Dr. Ria Commander Dr. Odis Brownie Greig Mosses, NP [place]affie Shed, [place] [facility] [place], [place] Beckey Coe, NP [place] Lee, NP Tinnie Redman, PharmD   Please be sure to bring in all your medications bottles to every appointment.   Need to Contact Us :  If you have any questions or concerns before your next appointment please send us  a message through [place] or call our office at [phone].    TO LEAVE A MESSAGE FOR THE NURSE SELECT OPTION 2, PLEASE LEAVE A MESSAGE INCLUDING: YOUR NAME DATE OF BIRTH CALL BACK NUMBER REASON FOR CALL**this is important as we prioritize the call backs  YOU WILL RECEIVE A CALL BACK THE SAME DAY AS LONG AS YOU CALL BEFORE 4:00 PM

## 2024-03-27 NOTE — Progress Notes (Signed)
 Patient ID: [name] [name], male   DOB: [birth year], 73 y.o.   MRN: [id number] PCP: Dr. Mahlon Cardiology: Dr. Rolan  Chief complaint: HF/Valvular Disease  73 y.o. with history of aortic insufficiency s/p Ross procedure and aortic root aneurysm presents for followup of Ross procedure.  He has a left-sided brachial plexopathy of uncertain etiology and has atrophy of his left shoulder and upper arm.  He developed weakness in his right hand as well as occasional vertigo-type spells.  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    Echo in 8/18 showed no regurgitation or stenosis of autograft in aortic position, bioprosthetic pulmonic valve with mild pulmonic stenosis, mean gradient 10 mmHg.  EF was read as 40-45%, which is lower than in the past.  MRA chest 3/19 showed 5.7 cm dilation of pulmonary autograft at level of sinuses of Valsalva, MRA chest in 6/20 showed 6 cm dilation of the pulmonary autograft at the level of the sinuses of valsalva.  He is being followed for aortic root aneurysm by Dr. Kerrin.   MRA chest in 6/21 showed aortic root 5.4 cm, no change from prior. Echo in 8/21 showed EF 50%, RV mildly enlarged with mildly decreased systolic function, pulmonary valve autograft in aortic position with mild regurgitation but no stenosis, bioprosthetic pulmonary valve with no significant stenosis or regurgitation, ascending aorta 5.3 cm.   Patient was noted to be in atrial flutter in 8/21, rate 110s.  Looking back, his HR was elevated in the 110s at physician appointments back to 5/21 (no ECGs).  Underwent TEE-guided DCCV in 8/21.  TEE showed EF 40%, mildly decreased RV function, aortic valve replaced by pulmonary autograft with mild  AI and no AS, bioprosthetic PV looked stable.  No LA appendage thrombus, he underwent DCCV.  After DCCV, he developed exertional dyspnea and was started on Lasix .   Echo in 10/21 showed EF improved to 55-60%, mild RV dilation with mildly decreased systolic function, bioprosthetic PV with peak gradient 31 mmHg (mild PS), pulmonary autograft in aortic position with mild regurgitation and no stenosis.  Sleep study showed severe OSA but he does not want to use CPAP.   MRA chest in 6/22 showed stable 5.4 cm aortic root.  Echo EF 50%, mild LV dilation, mildly decreased RV systolic function, pulmonary autograft in aortic position with mild regurgitation and no stenosis, bioprosthetic pulmonary valve with no significant stenosis or regurgitation, PASP 28 mmHg.   Zio monitor (12/22) showed short SVT and NSVT, no significant arrhythmias.    Patient had robotic repair of esophageal diverticulum.   MRA chest in 7/23 showed 5.4 cm aortic root.    CTA chest in 10/24 showed prior graft repair of ascending aorta with residual 4.5 cm dilation in the ascending aorta proximal to the repair, aneurysm left sinus of Valsalva with aortic root measuring 5.6 cm including the aneurysm.   Echo 10/24 showed EF 50%, mild LV dilation, mildly decreased RV systolic function, s/p Ross procedure with bioprosthetic pulmonary valve peak gradient 15 mmHg, normal aortic valve autograft, 5.4 cm aortic root.   Patient has been found to have achalasia of the cricopharyngeal muscle.   Today he returns for HF follow up.Overall feeling fine. No palpitations. Gets winded after  walking 2 months which has been baseline. Able walk up  15 stairs in his house. Denies PND/Orthopnea. Appetite ok. No fever or chills.. Taking all medications.  . Labs (3/23): K 4.5, creatinine 0.78, LDL 72, TGs 97 Labs (9/23): LDL 49, K 4.3, creatinine 9.14 Labs (9/24): hgb 15.2, TSH normal, K 4.1, creatinine 0.8, LDL 54, TGs 172 Labs (12/24): K 4.6, creatinine  0.83 Labs (2/25): K 4.3, creaitnine 0.84, TSH normal, BNP 106  PMH: 1. Aortic insufficiency: s/p Ross procedure in 1994 in [place].   - Echo (4/11) with hypokinetic basal septum (likely post-surgical), EF 50%, mild LVH, s/p Ross procedure with native pulmonic valve in aortic position with mild aortic insufficiency and bioprosthetic pulmonic valve with no pulmonic insufficiency, mild MR, aortic upper normal in size.  - Echo (4/13) with EF 55%, mild LVH, mild AI, mild MR, mild RV dilation, bioprosthetic pulmonic valve with peak pressure 25 mmHg.   - Echo (3/14): Mild LV dilation, mild LVH, EF 60%, pulmonary valve in aortic position (s/p Ross) with mild AI and mildly dilated ascending aorta to 4.3 cm, mildly dilated RV with normal systolic function, bioprosthetic PVR with mean gradient 23 mmHg.  Echo (3/15) with EF 55-60%, mild LVH, mild AI with no AS, bioprosthetic pulmonic valve with peak gradient 31 mmHg, ascending aorta 4.4 cm.   - Cardiac MRI/MRA chest (5/15) with mild regurgitation of autograft aortic valve and mild regurgitation of homograft pulmonic valve, dilation of aortic root at sinuses of valsalva (4.8 cm), EF 60%, no LGE, mild RV dilation with normal RV systolic function.   - Echo (5/16) with EF 55-60%, mild autograft AI, mild MR, mildly decreased RV systolic function, biatrial enlargement, RVSP 35 mmHg, trivially increased gradient across bioprosthetic pulmonic valve (no progression).  - Echo (8/17): EF 55-60%, pulmonary autograft in aortic valve position, mild AI, bioprosthetic pulmonary valve with mild PS (peak 22 mmHg), moderate RV dilation with normal systolic function.  - Echo (8/18): EF 40-45%, mildly increased gradient across bioprosthetic pulmonary valve (mean 10 mmHg), s/p Ross procedure with no stenosis or significant regurgitation of pulmonary autograft in aortic position.  - Echo (8/21): EF 50%, RV mildly enlarged with mildly decreased systolic function, pulmonary valve  autograft in aortic position with mild regurgitation but no stenosis, bioprosthetic pulmonary valve with no significant stenosis or regurgitation, ascending aorta 5.3 cm.  - TEE (8/21): EF 40%, mildly decreased RV function, aortic valve replaced by pulmonary autograft with mild AI and no AS, bioprosthetic PV looked stable. - Echo (10/21): EF improved to 55-60%, mild RV dilation with mildly decreased systolic function, bioprosthetic PV with peak gradient 31 mmHg (mild PS), pulmonary autograft in aortic position with mild regurgitation and not stenosis.  - Echo (12/22): EF 50%, mild LV dilation, mildly decreased RV systolic function, pulmonary autograft in aortic position with mild regurgitation and no stenosis, bioprosthetic pulmonary valve with no significant stenosis or regurgitation, PASP 28 mmHg. - Echo (10/24): EF 50%, mild LV dilation, mildly decreased RV systolic function, s/p Ross procedure with bioprosthetic pulmonary valve peak gradient 15 mmHg, normal aortic valve autograft, 5.4 cm aortic root.  2. Post-operative atrial fibrillation after heart surgery.  3. Normal left heart cath prior to 1994 heart surgery.  4. Left brachial plexopathy with left shoulder muscle atrophy.  Uncertain etiology.  5. BPH 6. Hyperlipidemia.  7. Cervical spinal stenosis with radiculopathy.   8. 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  9. ETT-Sestamibi (3/14): No ischemia or infarction.  10. Carotid dopplers (6/14): mild disease only.  11. Sinus of valsalva aneurysm: MRA chest 2/18 with dilation of pulmonary autograft at level of sinuses of Valsalva to 5.4 cm when measured into left cusp.  - MRA chest (3/19): Saccular dilatation of the left and right sinuses of valsalva, 5.7 cm aortic root diameter.   - MRA chest (6/20): 6 cm dilated sinuses of valsalva, 4.6 cm ascending aorta.  - MRA chest (6/21): aortic root 5.4 cm, no change from prior -  MRA chest (6/22): aortic root 5.4 cm.  - MRA chest (7/23): aortic root 5.4 cm - CTA chest (10/24): prior graft repair of ascending aorta with residual 4.5 cm dilation in the ascending aorta proximal to the repair, aneurysm left sinus of Valsalva with aortic root measuring 5.6 cm including the aneurysm.  12. Elevated left hemidiaphragm.  13. Atrial flutter: Atypical.  Noted in 8/21 => underwent DCCV to NSR.  14. Presyncope: Zio monitor 12/22 with short SVT and NSVT runs, no significant arrhythmias. 15. Esophageal diverticulum: s/p repair 16. Achalasia of the cricopharyngeal muscle  FH: Prostate CA, HTN  SH: Lives in [place], owns a window washing business, married, nonsmoker.   ROS: All systems reviewed and negative except as per HPI.   Current Outpatient Medications  Medication Sig Dispense Refill   apixaban  (ELIQUIS ) 5 MG TABS tablet Take 1 tablet (5 mg total) by mouth 2 (two) times daily. 180 tablet 3   azelastine  (ASTELIN ) 0.1 % nasal spray Place 1 spray into both nostrils as needed for rhinitis. Use in each nostril as directed     cetirizine  (ZYRTEC ) 10 MG tablet TAKE 1 TABLET(10 MG) BY MOUTH DAILY 90 tablet 0   empagliflozin  (JARDIANCE ) 10 MG TABS tablet Take 1 tablet (10 mg total) by mouth daily before breakfast. 90 tablet 3   famotidine (PEPCID) 40 MG tablet Take 40 mg by mouth as needed for heartburn or indigestion.     finasteride  (PROSCAR ) 5 MG tablet TAKE 1 TABLET(5 MG) BY MOUTH DAILY 90 tablet 1   fluticasone  (FLONASE ) 50 MCG/ACT nasal spray Place 2 sprays into both nostrils as needed for allergies or rhinitis. 50 mL 30   furosemide  (LASIX ) 20 MG tablet Take 1 tablet (20 mg total) by mouth every other day. 45 tablet 3   ipratropium (ATROVENT ) 0.06 % nasal spray Place 2 sprays into both nostrils daily.     losartan  (COZAAR ) 25 MG tablet Take 1 tablet (25 mg total) by mouth daily. 90 tablet 3   metoprolol  succinate (TOPROL -XL) 100 MG 24 hr tablet Take 1 tablet (100 mg total) by  mouth 2 (two) times daily. Take with or immediately following a meal. 180 tablet 3   montelukast  (SINGULAIR ) 10 MG tablet Take 1 tablet (10 mg total) by mouth at bedtime. 30 tablet 6   pantoprazole  (PROTONIX ) 40 MG tablet TAKE 1 TABLET(40 MG) BY MOUTH DAILY 90 tablet 0   rosuvastatin  (CRESTOR ) 40 MG tablet TAKE 1 TABLET(40 MG) BY MOUTH DAILY 90 tablet 3   spironolactone  (ALDACTONE ) 25 MG tablet TAKE 1 TABLET(25 MG) BY MOUTH DAILY 90 tablet 3   No current facility-administered medications for this encounter.   BP 122/80   Pulse 60   Ht 6' 2 (1.88 m)   Wt 99.6 kg (219 lb 9.6 oz)   SpO2 98%   BMI 28.19 kg/m  Wt Readings from Last 3 Encounters:  03/27/24 99.6 kg (219 lb 9.6 oz)  02/01/24 97.1  kg (214 lb)  01/05/24 97.3 kg (214 lb 8 oz)    General:   No resp difficulty Neck: no JVD.  Cor: Regular rate & rhythm. RUSB 2/6  Lungs: clear Abdomen: soft, nontender, nondistended.  Extremities: no  edema Neuro: alert & oriented x3  Assessment/Plan: 1. Atrial flutter:  Likely related to prior cardiac surgery.  He had peri-operative AF at time of heart surgery but had not been noted to have recurrent arrhythmias until 8/21.  In 8/21, he was noted to be in atrial flutter with RVR, based on review of HR over the past few months, he had probably been in atrial flutter since at least 5/21. He had DCCV in 8/21. He saw EP, ablation offered if flutter recurs - No recent issues.  Continue Eliquis  5 mg bid.  No bleeding issues.  - Continue Toprol  100/50. Stable 1st degree AVB on ECG.  2.  History of Ross Procedure: Echo10/24 showed pulmonary autograft in aortic position with trivial AI, no AS.  The bioprosthetic pulmonary valve appeared normal.   - Needs endocarditis prophylaxis with dental work.   3.  Aortic root aneurym: This not uncommonly accompanies Ross procedure.  CTA chest in 10/24 showed a left sinus of Valsalva aneurysm with the aortic root measuring 5.6 cm including the aneurysm; there was a  prior graft repair of the ascending aorta with residual 4.5 cm ascneding aorta dilation proximal to the graft repair.  - Following with TCTS. Aortic root is significantly dilated but has been stable. May be nearing need for elective repair.  He will have repeat CTA chest in 4/25.  - SBP stable.  4. HTN: Given aortic root/ascending aorta aneurysm, - Stable. Continue current dose of Toprol  XL.  - Continue losartan  25 mg daily. - Continue spironolactone  25 mg daily.    5. Hyperlipidemia: Continue current Crestor , good LDL in 9/24.  6. Chronic systolic CHF: TEE in 8/21 in setting of long-standing atrial flutter with RVR showed EF down to 40%. 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  He was cardioverted in 8/21 but developed exertional dyspnea with volume overload afterwards.  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  Echo in 12/22 showed EF 50% with mild RV dysfunction. Echo 10/24 showed EF 50%, mild RV dysfunction.   - NYHA II. Volume status stable. Continue Lasix  20 mg qod.  - Continue Toprol  XL 100 twice a day.   - Continue losartan  25 mg daily.   - Continue spironolactone  25 mg daily. - Continue Jardiance  10 mg daily.  - I reviewed BMET from June 2025.  7. OSA: Continue CPAP  8. Diabetes: Continue Jardiance .    Follow up in  3-4 months with Dr Rolan and an Echo. Continue yearly follow up with CT surgery.   Natlie Asfour NP-C  03/27/2024

## 2024-04-03 ENCOUNTER — Encounter: Payer: Self-pay | Admitting: Cardiology

## 2024-04-03 DIAGNOSIS — I5022 Chronic systolic (congestive) heart failure: Secondary | ICD-10-CM

## 2024-04-03 DIAGNOSIS — I10 Essential (primary) hypertension: Secondary | ICD-10-CM

## 2024-04-03 DIAGNOSIS — G4733 Obstructive sleep apnea (adult) (pediatric): Secondary | ICD-10-CM

## 2024-04-05 DIAGNOSIS — I10 Essential (primary) hypertension: Secondary | ICD-10-CM | POA: Diagnosis not present

## 2024-04-05 DIAGNOSIS — G4733 Obstructive sleep apnea (adult) (pediatric): Secondary | ICD-10-CM | POA: Diagnosis not present

## 2024-04-06 ENCOUNTER — Encounter: Payer: Self-pay | Admitting: Family Medicine

## 2024-04-06 ENCOUNTER — Ambulatory Visit: Admitting: Family Medicine

## 2024-04-06 ENCOUNTER — Ambulatory Visit: Payer: Self-pay | Admitting: Family Medicine

## 2024-04-06 DIAGNOSIS — I872 Venous insufficiency (chronic) (peripheral): Secondary | ICD-10-CM | POA: Insufficient documentation

## 2024-04-06 DIAGNOSIS — E118 Type 2 diabetes mellitus with unspecified complications: Secondary | ICD-10-CM

## 2024-04-06 DIAGNOSIS — Z7984 Long term (current) use of oral hypoglycemic drugs: Secondary | ICD-10-CM | POA: Diagnosis not present

## 2024-04-06 LAB — MICROALBUMIN / CREATININE URINE RATIO
Creatinine,U: 29.7 mg/dL
Microalb Creat Ratio: UNDETERMINED mg/g (ref 0.0–30.0)
Microalb, Ur: <0.7 mg/dL

## 2024-04-06 LAB — BASIC METABOLIC PANEL WITH GFR
BUN: 12 mg/dL (ref 6–23)
CO2: 29 meq/L (ref 19–32)
Calcium: 9.4 mg/dL (ref 8.4–10.5)
Chloride: 99 meq/L (ref 96–112)
Creatinine, Ser: 0.81 mg/dL (ref 0.40–1.50)
GFR: 88.57 mL/min (ref >60.00)
Glucose, Bld: 165 mg/dL — ABNORMAL HIGH (ref 70–99)
Potassium: 4.2 meq/L (ref 3.5–5.1)
Sodium: 137 meq/L (ref 135–145)

## 2024-04-06 LAB — HEMOGLOBIN A1C: Hgb A1c MFr Bld: 8.1 % — ABNORMAL HIGH (ref 4.6–6.5)

## 2024-04-06 MED ORDER — TIRZEPATIDE 2.5 MG/0.5ML ~~LOC~~ SOAJ
2.5000 mg | SUBCUTANEOUS | 1 refills | Status: DC
Start: 2024-04-06 — End: 2024-06-01

## 2024-04-06 NOTE — Patient Instructions (Signed)
 Follow up in 3-4 months to recheck sugar, blood pressure, cholesterol We'll notify you of your lab results and make any changes if needed Keep up the good work on healthy diet and regular exercise- you look great! Call with any questions or concerns Stay Safe!  Stay Healthy! Happy Fall!!!

## 2024-04-06 NOTE — Progress Notes (Signed)
 Lab results have been discussed.   Verbalized understanding? Yes Patient will pick up Mounjaro  when it is ready at pharmacy.  Are there any questions? No

## 2024-04-06 NOTE — Progress Notes (Signed)
   Subjective:    Patient ID: [name] [name], male    DOB: 1950/10/27, 73 y.o.   MRN: [id number]  HPI DM- chronic problem, on Jardiance  10mg  daily.  Last A1C 7.8%.  on ARB for renal protection.  UTD on eye exam.  Due for foot exam, microalbumin.  Denies CP, SOB, HA's, visual changes, abd pain, N/V.  No numbness/tingling of hands/feet.  No skin lesions or blisters/sores.    Review of Systems For ROS see HPI     Objective:   Physical Exam Vitals reviewed.  Constitutional:      General: He is not in acute distress.    Appearance: Normal appearance. He is well-developed. He is not ill-appearing.  HENT:     Head: Normocephalic and atraumatic.  Eyes:     Extraocular Movements: Extraocular movements intact.     Conjunctiva/sclera: Conjunctivae normal.     Pupils: Pupils are equal, round, and reactive to light.  Neck:     Thyroid : No thyromegaly.  Cardiovascular:     Rate and Rhythm: Normal rate and regular rhythm.     Pulses: Normal pulses.     Heart sounds: Murmur (II-III/VI SEM w/ valvular click) heard.  Pulmonary:     Effort: Pulmonary effort is normal. No respiratory distress.     Breath sounds: Normal breath sounds.  Abdominal:     General: Bowel sounds are normal. There is no distension.     Palpations: Abdomen is soft.  Musculoskeletal:     Cervical back: Normal range of motion and neck supple.     Right lower leg: No edema.     Left lower leg: No edema.  Lymphadenopathy:     Cervical: No cervical adenopathy.  Skin:    General: Skin is warm and dry.  Neurological:     General: No focal deficit present.     Mental Status: He is alert and oriented to person, place, and time.     Cranial Nerves: No cranial nerve deficit.  Psychiatric:        Mood and Affect: Mood normal.        Behavior: Behavior normal.           Assessment & Plan:

## 2024-04-07 ENCOUNTER — Other Ambulatory Visit: Payer: Self-pay

## 2024-04-07 DIAGNOSIS — I7121 Aneurysm of the ascending aorta, without rupture: Secondary | ICD-10-CM

## 2024-04-10 ENCOUNTER — Telehealth: Payer: Self-pay

## 2024-04-10 NOTE — Assessment & Plan Note (Signed)
 Chronic problem.  On Jardiance  w/o difficulty.  A1C is creeping up w/ last reading 7.8%  Will check labs and if A1C is elevated, will need to add medication.  Considering his ongoing cardiac issues, Mounjaro  would be a good option if needed.

## 2024-04-10 NOTE — Telephone Encounter (Signed)
 Office note is complete.  Please fax note and A1C result.

## 2024-04-10 NOTE — Telephone Encounter (Signed)
 Called [employer] to relay diagnoses code of E11.8 and recent A1C of 8.1% for patient to receive process approval for Mounjaro  Pen. [employer] requested previous notes and labs to be faxed to [phone].

## 2024-04-10 NOTE — Telephone Encounter (Signed)
 Faxed notes with dx code and lab result for A1c to [phone]

## 2024-04-10 NOTE — Telephone Encounter (Signed)
 Copied from CRM [id number]. Topic: Clinical - Medication Prior Auth >> Apr 10, 2024  3:35 PM [name] [name] wrote: Reason for CRM: [employer] is calling regarding patient's tirzepatide  (MOUNJARO ) 2.5 MG/0.5ML Pen [[id number]] prescription. They require a diagnosis code as well as patient's A1C levels in order to process the approval. Best call back number [phone] option 2.

## 2024-04-12 ENCOUNTER — Other Ambulatory Visit (HOSPITAL_COMMUNITY): Payer: Self-pay

## 2024-04-12 NOTE — Telephone Encounter (Signed)
 Per dr Shlomo, Has he tried a chin strap with the full face mask ?   No, the patient does not have a chin strap would you like to order one?

## 2024-04-16 DIAGNOSIS — G4733 Obstructive sleep apnea (adult) (pediatric): Secondary | ICD-10-CM | POA: Diagnosis not present

## 2024-04-17 ENCOUNTER — Other Ambulatory Visit (HOSPITAL_COMMUNITY): Payer: Self-pay

## 2024-04-17 NOTE — Telephone Encounter (Signed)
 This is his numbers from Sunday night: [name] [name], [name] [name] 04/16/2024 - 04/16/2024 Patient ID: [id number] DOB: 07/15/1955 Age: 73 years [facility] [address] [place] , [address] Phone: [phone] Fax: [phone] Email: [email or web address] Compliance Report Compliance Payor Standard Usage 04/16/2024 - 04/16/2024 Usage days 1/1 days (100%) >= 4 hours 1 days (100%) < 4 hours 0 days (0%) Usage hours 7 hours 53 minutes Average usage (total days) 7 hours 53 minutes Average usage (days used) 7 hours 53 minutes Median usage (days used) 7 hours 53 minutes Total used hours (value since last reset - 04/16/2024) 1,408 hours AirSense 11 AutoSet Serial number [id number] Mode AutoSet Min Pressure 4 cmH2O Max Pressure 15 cmH2O EPR Fulltime EPR level 1 Response Standard Therapy Pressure - cmH2O Median: 14.2 95th percentile: 14.8 Maximum: 14.8 Leaks - L/min Median: 0.0 95th percentile: 1.2 Maximum: 7.2 Events per hour AI: 1.5 HI: 7.8 AHI: 9.3 Apnea Index Central: 0.6 Obstructive: 0.8 Unknown: 0.0 RERA Index 0.3 Cheyne-Stokes respiration (average duration per night) 0 minutes (0%) Usage - hours Printed on 04/17/2024 - [employer] AirView version 4.49.0-5.0 Page 1 of 1

## 2024-04-18 NOTE — Telephone Encounter (Signed)
 Order placed to [facility] to increase his auto CPAP to 4 to 20 cm H2O and repeat a download in 2 weeks. Patient notified to let us  know if he continues to snore.

## 2024-04-24 NOTE — Telephone Encounter (Signed)
 Patient has updated living will/advanced care directives. FYI

## 2024-04-26 ENCOUNTER — Encounter: Payer: Self-pay | Admitting: Family Medicine

## 2024-04-26 IMAGING — RF DG ESOPHAGUS
13 of 20 series · 14 of 24 positions shown · non-contrast
Comparison: Chest CT from April 28, 2018.

CLINICAL DATA: A 70-year-old male presents with symptoms of reflux
and occasional globus sensation.

EXAM:
ESOPHOGRAM/BARIUM SWALLOW
TECHNIQUE: Single contrast examination was performed using  thin barium.
FLUOROSCOPY:
Radiation Exposure Index (as provided by the fluoroscopic device):
81.3 mGy Kerma

[Series 1: sequence · 1 of 12 frames shown (1 of 12)]
[frame 2/12]
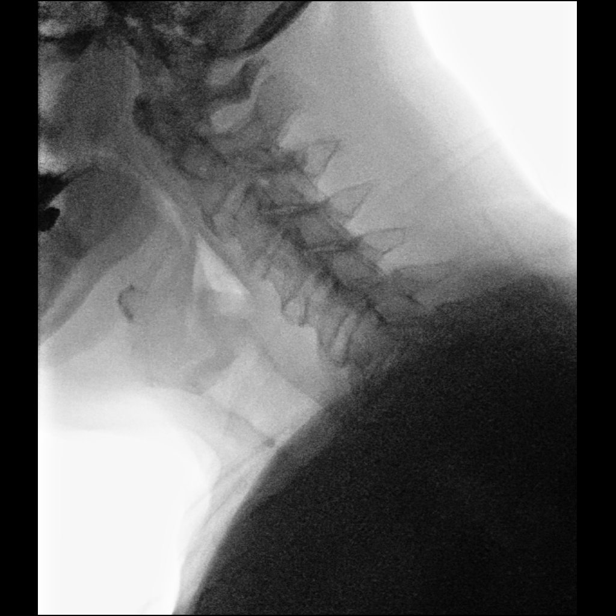

[Series 2: sequence · 1 of 27 frames shown (2 of 12)]
[frame 23/27]
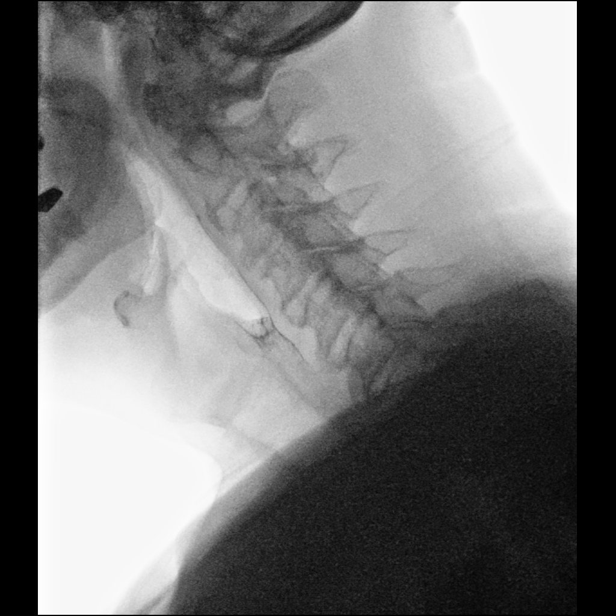

[Series 5: sequence · 1 of 36 frames shown (3 of 12)]
[frame 19/36]
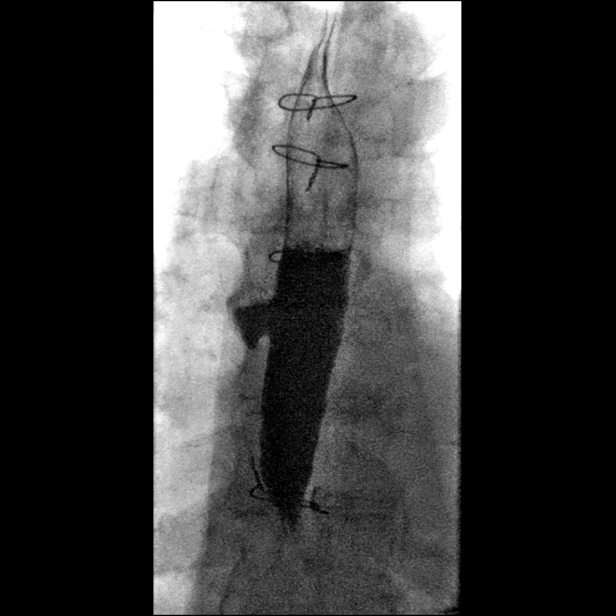

[Series 7: sequence · 1 of 38 frames shown (4 of 12)]
[frame 33/38]
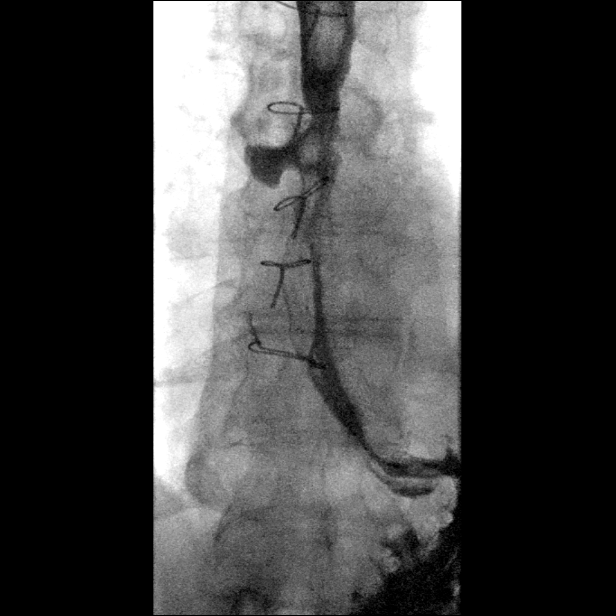

[Series 8: sequence · 1 of 13 frames shown (5 of 12)]
[frame 12/13]
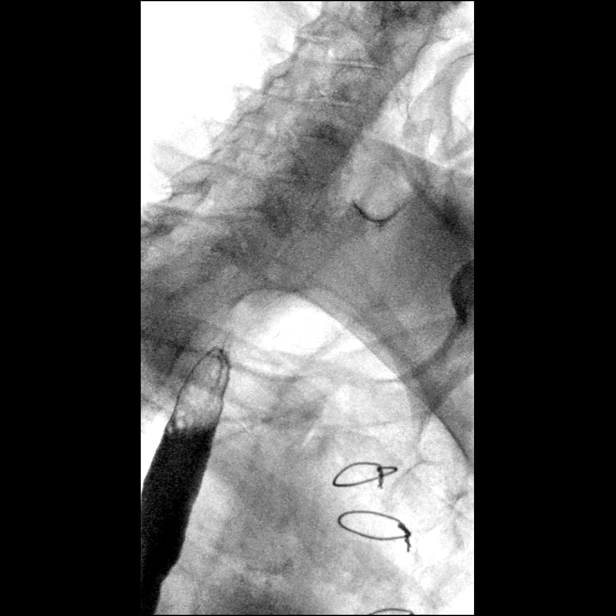

[Series 11: sequence · 1 of 20 frames shown (6 of 12)]
[frame 4/20]
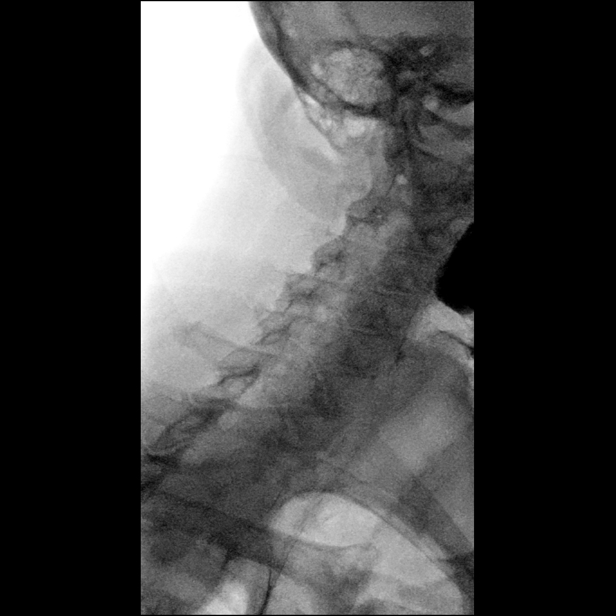

[Series 12: sequence · 1 of 9 frames shown (7 of 12)]
[frame 5/9]
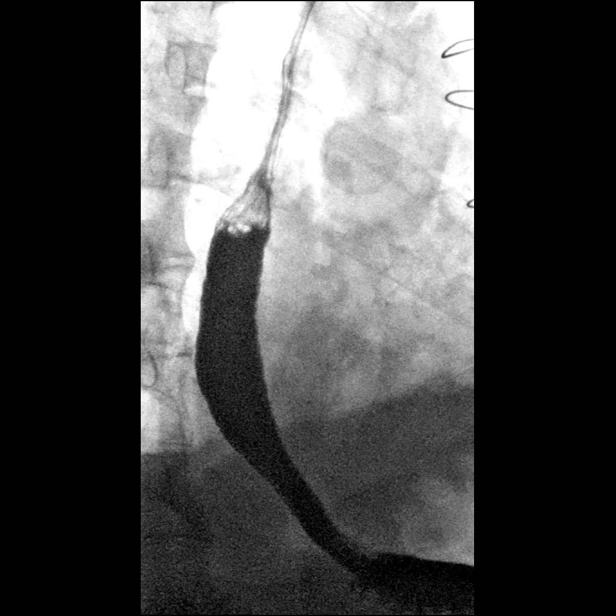

[Series 13: sequence · 1 of 15 frames shown (8 of 12)]
[frame 13/15]
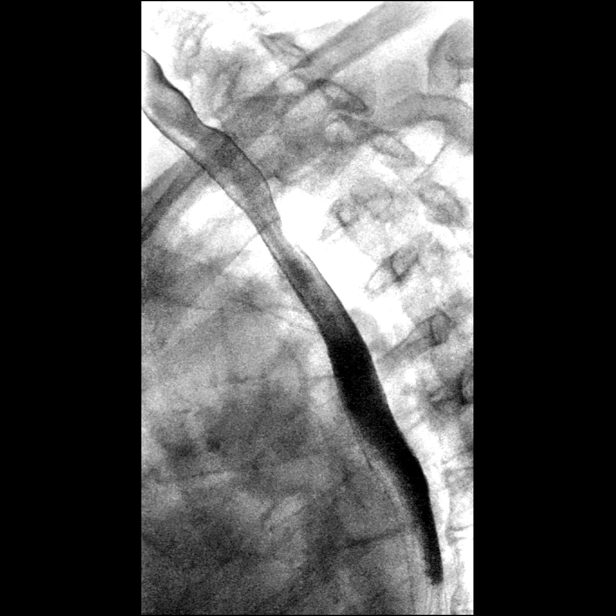

[Series 15: one shot · 1 of 2 slices shown]
[im 1/2]
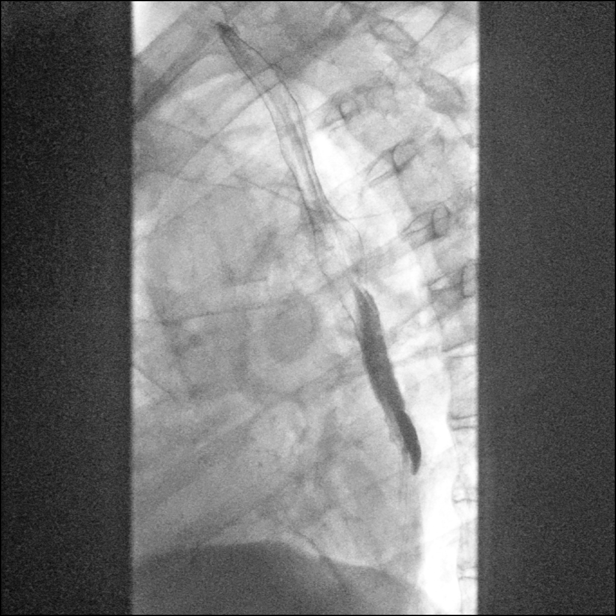

[Series 17: sequence · 1 of 8 frames shown (9 of 12)]
[frame 4/8]
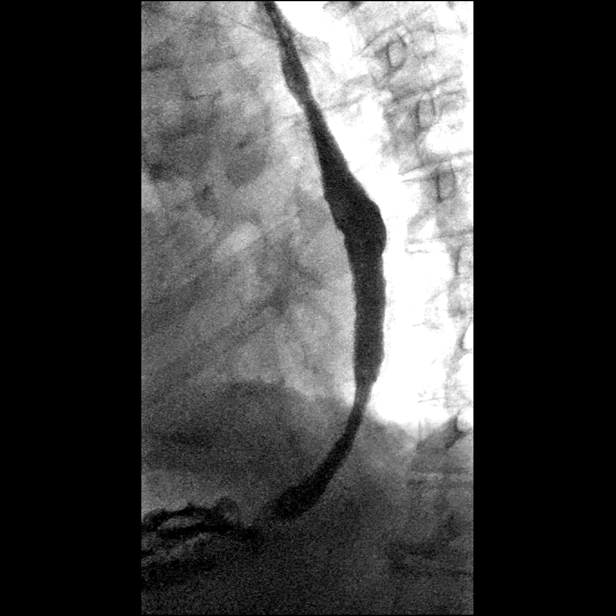

[Series 19: sequence · 2 of 6 frames shown (10 of 12)]
[frame 1/6]
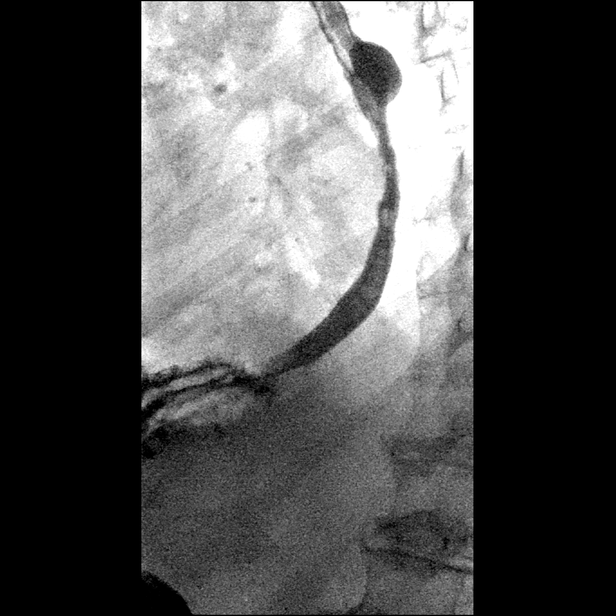
[frame 6/6]
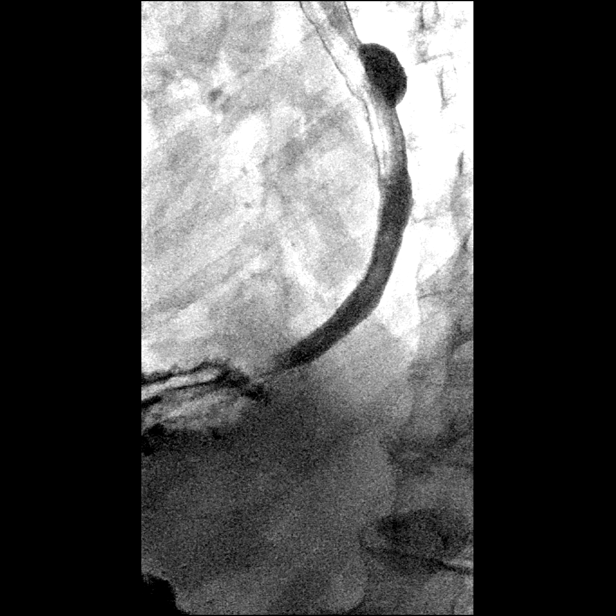

[Series 22: sequence · 1 of 2 frames shown (11 of 12)]
[frame 2/2]
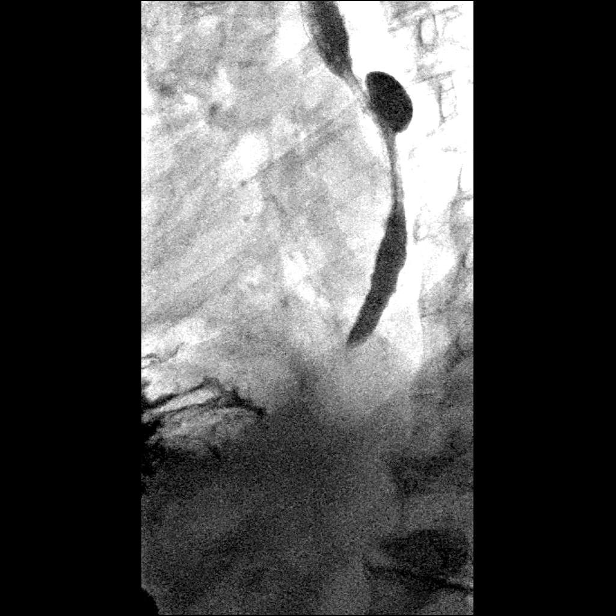

[Series 24: sequence · 1 of 3 frames shown (12 of 12)]
[frame 3/3]
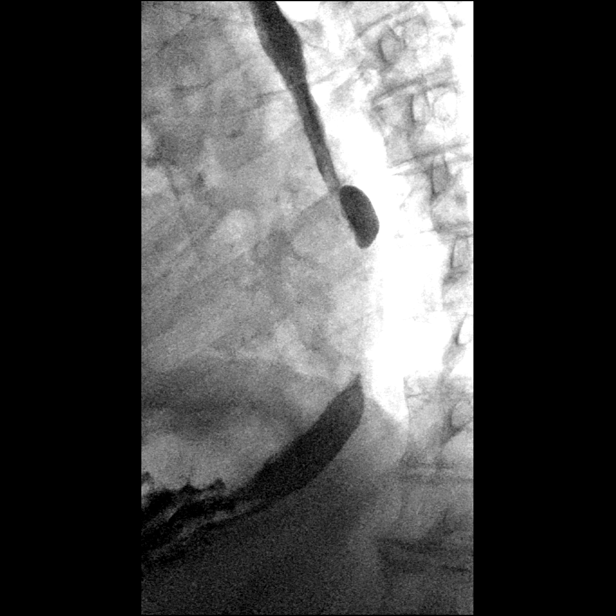

[14 of 24 positions shown; findings below may reference images not displayed]

FINDINGS: Swallowing first performed in the AP and lateral projections without
gross swallow dysfunction. Signs of w moderate CP bar. And evidence
of degenerative changes about the cervical spine with mild
impression upon the cervical esophagus.

Single contrast was utilized due to limited patient mobility
particularly with LEFT arm.

No fixed narrowing of the esophagus. Moderate size mid esophageal
diverticulum measuring up to 3.4 cm with smooth contour, assessment
of prior CT from 3486 shows that this is likely present also at that
time.

Single swallow w assessment with intact primary wave. Mild stasis in
the proximal esophagus of approximately [DATE] of the bolus. Mild
secondary and tertiary peristaltic activity.

Barium tablet was swallowed and passed without difficulty. No
gastroesophageal reflux was observed.
IMPRESSION: 1. Mild dysmotility.
2. Moderate mid esophageal diverticulum arising from the RIGHT
lateral soft [place].
3. No signs of narrowing or gross lesion on single contrast
evaluation.
4. No signs of gastroesophageal reflux or hiatal hernia.

## 2024-05-02 DIAGNOSIS — H2513 Age-related nuclear cataract, bilateral: Secondary | ICD-10-CM | POA: Diagnosis not present

## 2024-05-02 DIAGNOSIS — H5203 Hypermetropia, bilateral: Secondary | ICD-10-CM | POA: Diagnosis not present

## 2024-05-02 DIAGNOSIS — E119 Type 2 diabetes mellitus without complications: Secondary | ICD-10-CM | POA: Diagnosis not present

## 2024-05-02 LAB — OPHTHALMOLOGY REPORT-SCANNED

## 2024-05-03 ENCOUNTER — Telehealth (INDEPENDENT_AMBULATORY_CARE_PROVIDER_SITE_OTHER): Admitting: Family Medicine

## 2024-05-03 ENCOUNTER — Encounter: Payer: Self-pay | Admitting: Family Medicine

## 2024-05-03 DIAGNOSIS — R454 Irritability and anger: Secondary | ICD-10-CM

## 2024-05-03 NOTE — Progress Notes (Signed)
 Virtual Visit via Video   I connected with patient on 05/03/24 at  8:40 AM EDT by a video enabled telemedicine application and verified that I am speaking with the correct person using two identifiers.  Location patient: Home Location provider: [facility], Office Persons participating in the virtual visit: Patient, Provider, CMA (Chloe C)  I discussed the limitations of evaluation and management by telemedicine and the availability of in person appointments. The patient expressed understanding and agreed to proceed.  Subjective:   HPI:   Mood- [name] [name] reports he will periodically have 'anger spells'.  Not happening frequently, but after last episode he told wife he would ask for help.  He has not harmed anyone, 'no crimes have been committed'.  Previously saw Dr Tenny.  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  ROS:   See pertinent positives and negatives per HPI.  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   Aortic atherosclerosis 01/19/2022   Other allergic rhinitis 12/31/2021   Degenerative disc disease, lumbar 04/04/2021   Abdominal bloating 12/14/2019   Controlled diabetes mellitus type 2 with complications (HCC) 12/14/2019   Headache syndrome 10/18/2018   Laryngopharyngeal reflux (LPR) 08/10/2016   BPH (benign prostatic hyperplasia) 06/03/2015   H/O Ross procedure 01/31/2015   Thoracic aortic aneurysm 12/11/2013   Ganglion  cyst 06/01/2013   Brachial plexus neuropathy 10/07/2011   Cervical disc disease 10/07/2011   General medical examination 03/11/2011   Carpopedal spasm 02/25/2011   Arthritis of hand 02/25/2011   CARPAL TUNNEL SYNDROME, BILATERAL 06/10/2009   AORTIC VALVE REPLACEMENT, HX OF 06/10/2009   ESOPHAGEAL STRICTURE 03/29/2003   GERD 03/29/2003   HIATAL HERNIA 10/21/2000    Social History   Tobacco Use   Smoking status: Never    Passive exposure: Current (wife smokes in car)   Smokeless tobacco: Never  Substance Use Topics   Alcohol use: No    Current Outpatient Medications:    apixaban  (ELIQUIS ) 5 MG TABS tablet, Take 1 tablet (5 mg total) by mouth 2 (two) times daily., Disp: 180 tablet, Rfl: 3   azelastine  (ASTELIN ) 0.1 % nasal spray, Place 1 spray into both nostrils as needed for rhinitis. Use in each nostril as directed, Disp: , Rfl:    cetirizine  (ZYRTEC ) 10 MG tablet, TAKE 1 TABLET(10 MG) BY MOUTH DAILY, Disp: 90 tablet, Rfl: 0   empagliflozin  (JARDIANCE ) 10 MG TABS tablet, Take 1 tablet (10 mg total) by mouth daily before breakfast., Disp: 90 tablet, Rfl: 3   famotidine (PEPCID) 40 MG tablet, Take 40 mg by mouth as needed for heartburn or indigestion., Disp: , Rfl:    finasteride  (PROSCAR ) 5 MG tablet, TAKE 1 TABLET(5 MG) BY MOUTH DAILY, Disp: 90 tablet, Rfl: 1   fluticasone  (FLONASE ) 50 MCG/ACT nasal spray, Place 2 sprays into both nostrils as needed for allergies or rhinitis., Disp: 50 mL, Rfl: 30   furosemide  (LASIX ) 20 MG tablet, Take 1 tablet (20 mg total) by mouth  every other day., Disp: 45 tablet, Rfl: 3   ipratropium (ATROVENT ) 0.06 % nasal spray, Place 2 sprays into both nostrils daily., Disp: , Rfl:    losartan  (COZAAR ) 25 MG tablet, Take 1 tablet (25 mg total) by mouth daily., Disp: 90 tablet, Rfl: 3   metoprolol  succinate (TOPROL -XL) 100 MG 24 hr tablet, Take 1 tablet (100 mg total) by mouth 2 (two) times daily. Take with or immediately following a meal., Disp: 180 tablet,  Rfl: 3   montelukast  (SINGULAIR ) 10 MG tablet, Take 1 tablet (10 mg total) by mouth at bedtime., Disp: 30 tablet, Rfl: 6   pantoprazole  (PROTONIX ) 40 MG tablet, TAKE 1 TABLET(40 MG) BY MOUTH DAILY, Disp: 90 tablet, Rfl: 0   rosuvastatin  (CRESTOR ) 40 MG tablet, TAKE 1 TABLET(40 MG) BY MOUTH DAILY, Disp: 90 tablet, Rfl: 3   spironolactone  (ALDACTONE ) 25 MG tablet, TAKE 1 TABLET(25 MG) BY MOUTH DAILY, Disp: 90 tablet, Rfl: 3   tirzepatide  (MOUNJARO ) 2.5 MG/0.5ML Pen, Inject 2.5 mg into the skin once a week., Disp: 2 mL, Rfl: 1  Allergies  Allergen Reactions   Penicillins Hives and Swelling   Quinidine Other (See Comments)    Increase heart beat per patient    Objective:   There were no vitals taken for this visit. AAOx3, NAD NCAT, EOMI No obvious CN deficits Coloring WNL [name] [name] is able to speak clearly, coherently without shortness of breath or increased work of breathing.  Thought process is linear.  Mood is appropriate.   Assessment and Plan:   Anger outbursts- new to provider.  [name] [name] reports he has had these for many years.  He states no one has been harmed and he has never crossed a line, but he did promise his wife the next time it happened he would reach out and get counseling recommendations.  Referral placed.  Will follow.   Comer Greet, MD 05/03/2024

## 2024-05-04 ENCOUNTER — Other Ambulatory Visit: Payer: Self-pay | Admitting: Gastroenterology

## 2024-05-04 DIAGNOSIS — K219 Gastro-esophageal reflux disease without esophagitis: Secondary | ICD-10-CM

## 2024-05-08 ENCOUNTER — Telehealth (HOSPITAL_COMMUNITY): Payer: Self-pay

## 2024-05-08 ENCOUNTER — Other Ambulatory Visit: Payer: Self-pay

## 2024-05-08 ENCOUNTER — Other Ambulatory Visit (HOSPITAL_COMMUNITY): Payer: Self-pay

## 2024-05-08 ENCOUNTER — Other Ambulatory Visit (HOSPITAL_COMMUNITY): Payer: Self-pay | Admitting: Cardiology

## 2024-05-08 MED ORDER — EMPAGLIFLOZIN 10 MG PO TABS
10.0000 mg | ORAL_TABLET | Freq: Every day | ORAL | 3 refills | Status: AC
  Filled 2024-05-08: qty 90, 90d supply, fill #0
  Filled 2024-08-01: qty 90, 90d supply, fill #1

## 2024-05-08 NOTE — Telephone Encounter (Signed)
 Advanced Heart Failure Patient Advocate Encounter  Review of patient chart for grant renewal. Test billing returns $0 copay for Eliquis  and Jardiance , grant does not need renewal at this time. Left voicemail for patient.  Rachel DEL, CPhT Rx Patient Advocate Phone: [phone]

## 2024-05-10 ENCOUNTER — Telehealth: Payer: Self-pay

## 2024-05-10 DIAGNOSIS — R1313 Dysphagia, pharyngeal phase: Secondary | ICD-10-CM | POA: Diagnosis not present

## 2024-05-10 DIAGNOSIS — K224 Dyskinesia of esophagus: Secondary | ICD-10-CM | POA: Diagnosis not present

## 2024-05-10 DIAGNOSIS — Z1211 Encounter for screening for malignant neoplasm of colon: Secondary | ICD-10-CM | POA: Diagnosis not present

## 2024-05-10 NOTE — Telephone Encounter (Signed)
   Name: [name] [name]  DOB: 12/08/1950  MRN: [id number]  Primary Cardiologist: Ezra Shuck, MD   Preoperative team, please contact this patient and set up a phone call appointment for further preoperative risk assessment. Please obtain consent and complete medication review. Thank you for your help. Colonoscopy is scheduled for 06/13/2024.  I confirm that guidance regarding antiplatelet and oral anticoagulation therapy has been completed and, if necessary, noted below.  Pharmacy is addressing the Eliquis  hold  I also confirmed the patient resides in the state of [place] . As per [facility] Medical Board telemedicine laws, the patient must reside in the state in which the provider is licensed.   Lamarr Satterfield, NP 05/10/2024, 3:40 PM [facility]

## 2024-05-10 NOTE — Telephone Encounter (Signed)
 Pharmacy please advise on holding Eliquis  for 1-2 days prior to colonoscopy scheduled for 06/13/2024. Thank you.

## 2024-05-10 NOTE — Telephone Encounter (Signed)
   Pre-operative Risk Assessment    Patient Name: [name] [name]  DOB: 1950/11/16 MRN: [id number]   Date of last office visit: 03/27/24 AMY CLEGG, NP-C ([facility]) Date of next office visit: NONE   Request for Surgical Clearance    Procedure:  COLONOSCOPY  Date of Surgery:  Clearance 06/13/24                                Surgeon:  DR ESTEFANA KEAS Surgeon's Group or Practice Name:  [facility] Phone number:  [phone] Fax number:  [phone]   Type of Clearance Requested:   - Medical  - Pharmacy:  Hold Apixaban  (Eliquis ) 1-2 DAYS PRIOR   Type of Anesthesia:  PROPOFOL    Additional requests/questions:    Signed, Lucie DELENA Ku   05/10/2024, 3:24 PM

## 2024-05-10 NOTE — Telephone Encounter (Signed)
 1st attempt: Left message for pt to call our office and ask for the preop team to schedule TELE Preop appt.

## 2024-05-11 ENCOUNTER — Telehealth: Payer: Self-pay | Admitting: *Deleted

## 2024-05-11 NOTE — Telephone Encounter (Signed)
 Pt returning call to a nurse

## 2024-05-11 NOTE — Telephone Encounter (Signed)
 Pt has been scheduled for a tele visit, 05/17/24 10:40.  Consent on file / medications reconciled.

## 2024-05-11 NOTE — Telephone Encounter (Signed)
 Pt has been scheduled for a tele visit, 05/17/24 10:40.  Consent on file / medications reconciled.    Patient Consent for Virtual Visit        [name] [name] has provided verbal consent on 05/11/2024 for a virtual visit (video or telephone).   CONSENT FOR VIRTUAL VISIT FOR:  [name] [name]  By participating in this virtual visit I agree to the following:  I hereby voluntarily request, consent and authorize [facility] and its employed or contracted physicians, physician assistants, nurse practitioners or other licensed health care professionals (the Practitioner), to provide me with telemedicine health care services (the "Services) as deemed necessary by the treating Practitioner. I acknowledge and consent to receive the Services by the Practitioner via telemedicine. I understand that the telemedicine visit will involve communicating with the Practitioner through live audiovisual communication technology and the disclosure of certain medical information by electronic transmission. I acknowledge that I have been given the opportunity to request an in-person assessment or other available alternative prior to the telemedicine visit and am voluntarily participating in the telemedicine visit.  I understand that I have the right to withhold or withdraw my consent to the use of telemedicine in the course of my care at any time, without affecting my right to future care or treatment, and that the Practitioner or I may terminate the telemedicine visit at any time. I understand that I have the right to inspect all information obtained and/or recorded in the course of the telemedicine visit and may receive copies of available information for a reasonable fee.  I understand that some of the potential risks of receiving the Services via telemedicine include:  Delay or interruption in medical evaluation due to technological equipment failure or disruption; Information transmitted may not be  sufficient (e.g. poor resolution of images) to allow for appropriate medical decision making by the Practitioner; and/or  In rare instances, security protocols could fail, causing a breach of personal health information.  Furthermore, I acknowledge that it is my responsibility to provide information about my medical history, conditions and care that is complete and accurate to the best of my ability. I acknowledge that Practitioner's advice, recommendations, and/or decision may be based on factors not within their control, such as incomplete or inaccurate data provided by me or distortions of diagnostic images or specimens that may result from electronic transmissions. I understand that the practice of medicine is not an exact science and that Practitioner makes no warranties or guarantees regarding treatment outcomes. I acknowledge that a copy of this consent can be made available to me via my patient portal [facility] MyChart), or I can request a printed copy by calling the office of [facility].    I understand that my insurance will be billed for this visit.   I have read or had this consent read to me. I understand the contents of this consent, which adequately explains the benefits and risks of the Services being provided via telemedicine.  I have been provided ample opportunity to ask questions regarding this consent and the Services and have had my questions answered to my satisfaction. I give my informed consent for the services to be provided through the use of telemedicine in my medical care

## 2024-05-12 ENCOUNTER — Ambulatory Visit (HOSPITAL_COMMUNITY)
Admission: RE | Admit: 2024-05-12 | Discharge: 2024-05-12 | Disposition: A | Discharge status: Home / Self Care | Source: Ambulatory Visit

## 2024-05-12 DIAGNOSIS — I2510 Atherosclerotic heart disease of native coronary artery without angina pectoris: Secondary | ICD-10-CM | POA: Insufficient documentation

## 2024-05-12 DIAGNOSIS — I700 Atherosclerosis of aorta: Secondary | ICD-10-CM | POA: Diagnosis not present

## 2024-05-12 DIAGNOSIS — I517 Cardiomegaly: Secondary | ICD-10-CM | POA: Diagnosis not present

## 2024-05-12 DIAGNOSIS — I7121 Aneurysm of the ascending aorta, without rupture: Secondary | ICD-10-CM | POA: Insufficient documentation

## 2024-05-12 DIAGNOSIS — K5730 Diverticulosis of large intestine without perforation or abscess without bleeding: Secondary | ICD-10-CM | POA: Insufficient documentation

## 2024-05-12 MED ORDER — IOHEXOL 350 MG/ML SOLN
75.0000 mL | Freq: Once | INTRAVENOUS | Status: AC | PRN
  Administered 2024-05-12: 75 mL via INTRAVENOUS

## 2024-05-16 NOTE — Telephone Encounter (Signed)
 Patient with diagnosis of aflutter on Eliquis  for anticoagulation.    Procedure: COLONOSCOPY  Date of procedure: 06/13/24   CHA2DS2-VASc Score = 5   This indicates a 7.2% annual risk of stroke. The patient's score is based upon: CHF History: 1 HTN History: 1 Diabetes History: 1 Stroke History: 0 Vascular Disease History: 1 Age Score: 1 Gender Score: 0      CrCl 116 ml/min Platelet count 242  Patient has not had an Afib/aflutter ablation in the last 3 months, DCCV within the last 4 weeks or a watchman implanted in the last 45 days   Per office protocol, patient can hold Eliquis  for 2 days prior to procedure.    **This guidance is not considered finalized until pre-operative APP has relayed final recommendations.**

## 2024-05-17 ENCOUNTER — Ambulatory Visit: Attending: Cardiovascular Disease | Admitting: Emergency Medicine

## 2024-05-17 DIAGNOSIS — Z01810 Encounter for preprocedural cardiovascular examination: Secondary | ICD-10-CM

## 2024-05-17 NOTE — Progress Notes (Signed)
 Virtual Visit via Telephone Note   Because of [name] [name] co-morbid illnesses, he is at least at moderate risk for complications without adequate follow up.  This format is felt to be most appropriate for this patient at this time.  Due to technical limitations with video connection Web designer), today's appointment will be conducted as an audio only telehealth visit, and [name] [name] verbally agreed to proceed in this manner.   All issues noted in this document were discussed and addressed.  No physical exam could be performed with this format.  Evaluation Performed:  Preoperative cardiovascular risk assessment _____________   Date:  05/17/2024   Patient ID:  [name] [name], DOB Dec 06, 1950, MRN [id number] Patient Location:  Home Provider location:   Office  Primary Care Provider:  Mahlon Comer BRAVO, MD Primary Cardiologist:  Ezra Shuck, MD  Chief Complaint / Patient Profile   73 y.o. y/o male with a h/o aortic insufficiency s/p Ross procedure, chronic systolic heart failure, aortic root aneurysm, postoperative atrial fibrillation, BPH, hyperlipidemia, cervical spinal stenosis with radiculopathy, left occipital cavernoma, carotid artery disease, sinus of Valsalva aneurysm, atypical atrial flutter, hypertension, hyperlipidemia, type 2 diabetes, struct of sleep apnea who is pending colonoscopy on 06/13/2024 with [facility] gastroenterology and presents today for telephonic preoperative cardiovascular risk assessment.  History of Present Illness    [name] [name] is a 73 y.o. male who presents via audio/video conferencing for a telehealth visit today.  Pt was last seen in cardiology clinic on 03/27/2024 by Greig Mosses, NP.  At that time [name] [name] was doing well.  The patient is now pending procedure as outlined above. Since his last visit, he denies chest pain, shortness of breath, lower extremity edema, fatigue, palpitations, melena, hematuria, hemoptysis, diaphoresis, weakness,  presyncope, syncope, orthopnea, and PND.  Today patient is doing well overall without acute cardiovascular concerns.  He denies any episodes of breakthrough atrial fibrillation/flutter.  He remains fairly active as he continues to do yard work almost daily and walks up and down the stairs several times a day without any exertional symptoms.  He describes no symptoms of active angina.  Continues to wear his CPAP mask nightly.  Remains adherent to his current medication regimen.  Diabetes now managed on Mounjaro  and just took his fifth dose.  Overall he feels he is doing well.  He can complete greater than 4 METS without exertional symptoms.  No symptoms to suggest active angina.  Past Medical History    Past Medical History:  Diagnosis Date   Acute gastritis    Allergy  Child   Aortic insufficiency    s/p Ross Procedure: AVR/Pulmonary autograft in aortic position & PVR with bioprosthetic PV 1994 in [name]   Aortic root aneurysm    Arthritis 2020   Atrial flutter (HCC)    history- no atrial flutter since cardioversion   CHF (congestive heart failure) (HCC)    Diabetes mellitus without complication (HCC)    type 2   Early satiety    Esophageal stricture    GERD (gastroesophageal reflux disease)    Headache syndrome 10/18/2018   Heart murmur Child   Hiatal hernia    Hyperlipidemia    Hypertension Mid 1990's   Neuropathy    left brachial plexus   OSA on CPAP    mild obstructive sleep apnea overall but moderate during REM sleep with REM AHI 16.6/h and overall AHI 6.6/h.  In auto CPAP from 4 to 15 cm H2O   Pain,  dental    SCC (squamous cell carcinoma) 02/10/2023   right forearm   Past Surgical History:  Procedure Laterality Date   AORTIC VALVE REPLACEMENT  08/03/1992   CARDIAC VALVE REPLACEMENT  1994   CARDIOVERSION N/A 03/14/2020   Procedure: CARDIOVERSION;  Surgeon: Rolan Ezra RAMAN, MD;  Location: [facility] ENDOSCOPY;  Service: Cardiovascular;  Laterality: N/A;   ESOPHAGEAL MANOMETRY  N/A 09/23/2022   Procedure: ESOPHAGEAL MANOMETRY (EM);  Surgeon: Shila Gustav GAILS, MD;  Location: [facility] ENDOSCOPY;  Service: Gastroenterology;  Laterality: N/A;   ESOPHAGOGASTRODUODENOSCOPY N/A 03/30/2022   Procedure: ESOPHAGOGASTRODUODENOSCOPY (EGD);  Surgeon: Shyrl Linnie KIDD, MD;  Location: [facility] OR;  Service: Thoracic;  Laterality: N/A;   GANGLION CYST EXCISION  08/03/2000   HERNIA REPAIR  Unknown   HIATAL HERNIA REPAIR  07/03/2001   INTERCOSTAL NERVE BLOCK Right 03/30/2022   Procedure: INTERCOSTAL NERVE BLOCK;  Surgeon: Shyrl Linnie KIDD, MD;  Location: [facility] OR;  Service: Thoracic;  Laterality: Right;   TEE WITHOUT CARDIOVERSION N/A 03/14/2020   Procedure: TRANSESOPHAGEAL ECHOCARDIOGRAM (TEE);  Surgeon: Rolan Ezra RAMAN, MD;  Location: [facility] ENDOSCOPY;  Service: Cardiovascular;  Laterality: N/A;   TONSILLECTOMY     VASECTOMY  07/03/2001    Allergies  Allergies  Allergen Reactions   Penicillins Hives and Swelling   Quinidine Other (See Comments)    Increase heart beat per patient    Home Medications    Prior to Admission medications   Medication Sig Start Date End Date Taking? Authorizing Provider  apixaban  (ELIQUIS ) 5 MG TABS tablet Take 1 tablet (5 mg total) by mouth 2 (two) times daily. 05/24/23   Rolan Ezra RAMAN, MD  azelastine  (ASTELIN ) 0.1 % nasal spray Place 1 spray into both nostrils as needed for rhinitis. Use in each nostril as directed    [provider]  cetirizine  (ZYRTEC ) 10 MG tablet TAKE 1 TABLET(10 MG) BY MOUTH DAILY 04/24/21   Tabori, Katherine E, MD  empagliflozin  (JARDIANCE ) 10 MG TABS tablet Take 1 tablet (10 mg total) by mouth daily before breakfast. Take 1 tablet (10 mg total) by mouth daily before breakfast. 05/08/24   Rolan Ezra RAMAN, MD  famotidine (PEPCID) 40 MG tablet Take 40 mg by mouth as needed for heartburn or indigestion.    [provider]  finasteride  (PROSCAR ) 5 MG tablet TAKE 1 TABLET(5 MG) BY MOUTH DAILY 02/07/24   Tabori,  Katherine E, MD  fluticasone  (FLONASE ) 50 MCG/ACT nasal spray Place 2 sprays into both nostrils as needed for allergies or rhinitis. 09/16/23   Tabori, Katherine E, MD  furosemide  (LASIX ) 20 MG tablet Take 1 tablet (20 mg total) by mouth every other day. 10/25/23   Rolan Ezra RAMAN, MD  ipratropium (ATROVENT ) 0.06 % nasal spray Place 2 sprays into both nostrils daily.    [provider]  losartan  (COZAAR ) 25 MG tablet Take 1 tablet (25 mg total) by mouth daily. 07/30/23   Rolan Ezra RAMAN, MD  metoprolol  succinate (TOPROL -XL) 100 MG 24 hr tablet Take 1 tablet (100 mg total) by mouth 2 (two) times daily. Take with or immediately following a meal. 10/11/23   Rolan Ezra RAMAN, MD  montelukast  (SINGULAIR ) 10 MG tablet Take 1 tablet (10 mg total) by mouth at bedtime. 12/31/23   Tabori, Katherine E, MD  pantoprazole  (PROTONIX ) 40 MG tablet TAKE 1 TABLET(40 MG) BY MOUTH DAILY 05/04/24   Nandigam, Kavitha V, MD  rosuvastatin  (CRESTOR ) 40 MG tablet TAKE 1 TABLET(40 MG) BY MOUTH DAILY 08/17/23   Rolan Ezra RAMAN,  MD  spironolactone  (ALDACTONE ) 25 MG tablet TAKE 1 TABLET(25 MG) BY MOUTH DAILY 06/14/23   Rolan Ezra RAMAN, MD  tirzepatide  (MOUNJARO ) 2.5 MG/0.5ML Pen Inject 2.5 mg into the skin once a week. 04/06/24   Mahlon Comer BRAVO, MD    Physical Exam    Vital Signs:  [name] [name] does not have vital signs available for review today.  Given telephonic nature of communication, physical exam is limited. AAOx3. NAD. Normal affect.  Speech and respirations are unlabored.  Accessory Clinical Findings    None  Assessment & Plan    1.  Preoperative Cardiovascular Risk Assessment: According to the Revised Cardiac Risk Index (RCRI), his Perioperative Risk of Major Cardiac Event is (%): 0.9. His Functional Capacity in METs is: 5.62 according to the Duke Activity Status Index (DASI).  Therefore, based on ACC/AHA guidelines, patient would be at acceptable risk for the planned procedure without further  cardiovascular testing. I will route this recommendation to the requesting party via Epic fax function.  The patient was advised that if he develops new symptoms prior to surgery to contact our office to arrange for a follow-up visit, and he verbalized understanding.  Per office protocol, patient can hold Eliquis  for 2 days prior to procedure.    A copy of this note will be routed to requesting surgeon.  Time:   Today, I have spent 10 minutes with the patient with telehealth technology discussing medical history, symptoms, and management plan.     [name] LITTIE Louis, NP  05/17/2024, 10:56 AM

## 2024-05-19 ENCOUNTER — Encounter: Payer: Self-pay | Admitting: Family Medicine

## 2024-05-19 ENCOUNTER — Ambulatory Visit: Admitting: Family Medicine

## 2024-05-19 VITALS — BP 116/76 | HR 52 | Temp 97.8°F | Ht 74.0 in | Wt 216.2 lb

## 2024-05-19 DIAGNOSIS — T887XXA Unspecified adverse effect of drug or medicament, initial encounter: Secondary | ICD-10-CM

## 2024-05-19 NOTE — Progress Notes (Signed)
   Subjective:    Patient ID: [name] [name], male    DOB: Dec 17, 1950, 73 y.o.   MRN: [id number]  HPI Medication side effect- pt was started on Mounjaro  last month as A1C jumped over 8%.  Currently on Jardiance  10mg  daily and Mounjaro  2.5mg  weekly.  Pt reports indigestion after eating.  Good relief w/ Famotidine.  Has sensation of inability to belch and nausea after eating.  Also having headaches and some dizziness.  Dizziness will last 'about 60 seconds or so'.  Sxs started ~8-9 days ago.  Pt doesn't feel there is a temporal relation to shot.  Is still eating regularly.  Gives himself shot on Wednesdays.     Review of Systems For ROS see HPI     Objective:   Physical Exam Vitals reviewed.  Constitutional:      General: He is not in acute distress.    Appearance: Normal appearance. He is well-developed. He is not ill-appearing.  HENT:     Head: Normocephalic and atraumatic.  Eyes:     Extraocular Movements: Extraocular movements intact.     Conjunctiva/sclera: Conjunctivae normal.     Pupils: Pupils are equal, round, and reactive to light.  Neck:     Thyroid : No thyromegaly.  Cardiovascular:     Rate and Rhythm: Normal rate and regular rhythm.     Pulses: Normal pulses.     Heart sounds: Murmur heard.  Pulmonary:     Effort: Pulmonary effort is normal. No respiratory distress.     Breath sounds: Normal breath sounds.  Abdominal:     General: Bowel sounds are normal. There is no distension.     Palpations: Abdomen is soft.  Musculoskeletal:     Cervical back: Normal range of motion and neck supple.     Right lower leg: No edema.     Left lower leg: No edema.  Lymphadenopathy:     Cervical: No cervical adenopathy.  Skin:    General: Skin is warm and dry.  Neurological:     General: No focal deficit present.     Mental Status: He is alert and oriented to person, place, and time.     Cranial Nerves: No cranial nerve deficit.  Psychiatric:        Mood and Affect: Mood  normal.        Behavior: Behavior normal.           Assessment & Plan:  Medication side effect- new.  Pt reports he has had significant indigestion in the last 10 days. Also notes return of headaches and occasional dizziness.  Thankfully this does not seem cardiac related and is likely due to starting the Tirzepatide  last month.  Encouraged him to hold his dose x2 weeks and see if sxs improve.  If yes, will find alternative DM medication.  If no, will need further workup to determine cause. Pt expressed understanding and is in agreement w/ plan.

## 2024-05-19 NOTE — Patient Instructions (Signed)
 Follow up as needed or as scheduled STOP the Mounjaro  for the next 2 injections (at least) INCREASE your water intake CONTINUE the Famotidine as needed for reflux/indigestion Make sure you are eating LOTS of protein Call with any questions or concerns Stay Safe! Stay Healthy! Hang in there!!

## 2024-05-22 DIAGNOSIS — G4733 Obstructive sleep apnea (adult) (pediatric): Secondary | ICD-10-CM | POA: Diagnosis not present

## 2024-05-22 DIAGNOSIS — I10 Essential (primary) hypertension: Secondary | ICD-10-CM | POA: Diagnosis not present

## 2024-05-23 ENCOUNTER — Ambulatory Visit

## 2024-05-29 ENCOUNTER — Ambulatory Visit

## 2024-05-29 VITALS — BP 135/61 | HR 66 | Resp 20 | Ht 74.0 in | Wt 219.0 lb

## 2024-05-29 DIAGNOSIS — I7121 Aneurysm of the ascending aorta, without rupture: Secondary | ICD-10-CM

## 2024-05-29 NOTE — Progress Notes (Signed)
 [address] [place] [address]             [id number]            [name] [name] [id number] [birth year]   History of Present Illness:  [name] [name] is a 73 year old man with medical history of congenital aortic insufficiency, Ross procedure (in [place] in 1994), neoaortic root and ascending aneurysms, hiatal hernia, reflux, esophageal stricture, esophageal diverticulum (s/p diverticulectomy and esophagomyotomy by Dr. Shyrl 03/2022), hypertension, left brachial plexopathy, OSA with CPAP, and elevated left hemidiaphragm. The patient has a stable dilated neoaortic root measuring up to 5.6cm dating back to 2015. He was followed by Dr. Army starting in 2016 with annual scans and then has been followed by Dr. Kerrin since 2022.   The CTA of chest showed proximal ascending thoracic aorta measured 4.6 cm.  A 2.2 cm area of outpouching of the left sinus of Valsalva is stable in size when compared to previous years CTA scans.     He presents to the clinic today with his wife and reports that he has been doing well.  His blood pressure is well controlled with current medication therapy.  He is very active around his house with yard work.  He denies heavy lifting.  He is still having issues with dysphagia but he has esophogeal dilation procedure scheduled next week.  He has this done every 6 months which he finds beneficial. He also finds that cough drops while eating helps with swallowing.  He denies chest pain, shortness of breath and lower leg swelling.      Current Outpatient Medications on File Prior to Visit  Medication Sig Dispense Refill   apixaban  (ELIQUIS ) 5 MG TABS tablet Take 1 tablet (5 mg total) by mouth 2 (two) times daily. 180 tablet 3   azelastine  (ASTELIN ) 0.1 % nasal spray Place 1 spray into both nostrils as needed for rhinitis. Use in each nostril as directed     cetirizine  (ZYRTEC ) 10 MG tablet TAKE 1 TABLET(10 MG) BY MOUTH  DAILY 90 tablet 0   empagliflozin  (JARDIANCE ) 10 MG TABS tablet Take 1 tablet (10 mg total) by mouth daily before breakfast. Take 1 tablet (10 mg total) by mouth daily before breakfast. 90 tablet 3   famotidine (PEPCID) 40 MG tablet Take 40 mg by mouth as needed for heartburn or indigestion.     finasteride  (PROSCAR ) 5 MG tablet TAKE 1 TABLET(5 MG) BY MOUTH DAILY 90 tablet 1   fluticasone  (FLONASE ) 50 MCG/ACT nasal spray Place 2 sprays into both nostrils as needed for allergies or rhinitis. 50 mL 30   furosemide  (LASIX ) 20 MG tablet Take 1 tablet (20 mg total) by mouth every other day. 45 tablet 3   ipratropium (ATROVENT ) 0.06 % nasal spray Place 2 sprays into both nostrils daily.     losartan  (COZAAR ) 25 MG tablet Take 1 tablet (25 mg total) by mouth daily. 90 tablet 3   metoprolol  succinate (TOPROL -XL) 100 MG 24 hr tablet Take 1 tablet (100 mg total) by mouth 2 (two) times daily. Take with or immediately following a meal. 180 tablet 3   montelukast  (SINGULAIR ) 10 MG tablet Take 1 tablet (10 mg total) by mouth at bedtime. 30 tablet 6   pantoprazole  (PROTONIX ) 40 MG tablet TAKE 1 TABLET(40 MG) BY MOUTH DAILY 90 tablet 0   rosuvastatin  (CRESTOR ) 40 MG tablet TAKE 1 TABLET(40 MG) BY MOUTH  DAILY 90 tablet 3   spironolactone  (ALDACTONE ) 25 MG tablet TAKE 1 TABLET(25 MG) BY MOUTH DAILY 90 tablet 3   tirzepatide  (MOUNJARO ) 2.5 MG/0.5ML Pen Inject 2.5 mg into the skin once a week. (Patient not taking: Reported on 05/29/2024) 2 mL 1   No current facility-administered medications on file prior to visit.     ROS: ROS   BP 135/61   Pulse 66   Resp 20   Ht 6' 2 (1.88 m)   Wt 219 lb (99.3 kg)   SpO2 96%   BMI 28.12 kg/m   Physical Exam Constitutional:      Appearance: Normal appearance.  HENT:     Head: Normocephalic and atraumatic.  Cardiovascular:     Rate and Rhythm: Normal rate and regular rhythm.     Heart sounds: Murmur heard.     Systolic murmur is present.  Skin:    General:  Skin is warm and dry.  Neurological:     General: No focal deficit present.     Mental Status: He is alert and oriented to person, place, and time.      Imaging: CLINICAL DATA:  Follow-up proximal ascending thoracic aortic aneurysm.   EXAM: CT ANGIOGRAPHY CHEST WITH CONTRAST   TECHNIQUE: Multidetector CT imaging of the chest was performed using the standard protocol during bolus administration of intravenous contrast. Multiplanar CT image reconstructions and MIPs were obtained to evaluate the vascular anatomy.   RADIATION DOSE REDUCTION: This exam was performed according to the departmental dose-optimization program which includes automated exposure control, adjustment of the mA and/or kV according to patient size and/or use of iterative reconstruction technique.   CONTRAST:  75mL OMNIPAQUE  IOHEXOL  350 MG/ML SOLN   COMPARISON:  11/11/2023   FINDINGS: Cardiovascular: Stable changes of previous graft repair of the ascending thoracic aorta with residual dilatation of the proximal ascending thoracic aorta and aortic root. The proximal ascending thoracic aorta measures 4.6 cm in maximum diameter, previously 4.7 cm. A 2.2 cm area of outpouching of the left sinus of Valsalva is again demonstrated, previously 2.2 cm.   Stable enlarged heart. No pericardial effusion. Atheromatous calcifications, including the coronary arteries and aorta. Dense main pulmonary artery calcifications are again demonstrated with dilated right and left main pulmonary arteries without dilatation of the main pulmonary artery segment.   Mediastinum/Nodes: No enlarged mediastinal, hilar, or axillary lymph nodes. Thyroid  gland, trachea, and esophagus demonstrate no significant findings.   Lungs/Pleura: Stable left basilar atelectasis/scarring. Minimal biapical pleural and parenchymal scarring. Stable right lower lobe calcified granuloma. The remainder of the lungs are clear.   Upper Abdomen: Multiple  calcified granulomata in the spleen and liver. Multiple colonic diverticula without evidence of diverticulitis.   Musculoskeletal: Thoracic spine degenerative changes with changes of DISH. Stable markedly elevated left hemidiaphragm.   Review of the MIP images confirms the above findings.   IMPRESSION: 1. Stable changes of previous graft repair of the ascending thoracic aorta with residual dilatation of the proximal ascending thoracic aorta and aortic root measuring 4.6 cm in maximum diameter, previously 4.7 cm. 2. Stable 2.2 cm area of outpouching of the left sinus of Valsalva. 3. Stable cardiomegaly. 4. Stable dense main pulmonary artery calcifications with dilated right and left main pulmonary arteries without dilatation of the main pulmonary artery segment. This can be seen with chronic pulmonary arterial hypertension. 5. Stable markedly elevated left hemidiaphragm with associated left basilar atelectasis/scarring. 6. Colonic diverticulosis. 7. Calcific coronary artery and aortic atherosclerosis.   Aortic Atherosclerosis (ICD10-I70.0).  Electronically Signed   By: Elspeth Bathe M.D.   On: 05/12/2024 10:05     A/P:  Aneurysm of ascending aorta without rupture -Stable graft repair of ascending aorta. Dilatation of the proximal ascending thoracic aorta measuring 4.6 cm.  -Stable 2.2 cm area of outpouching of the left sinus of Valsalva. -We discussed the natural history and and risk factors for growth of ascending aortic aneurysms. Discussed recommendations to minimize the risk of further expansion or dissection including careful blood pressure control, avoidance of contact sports and heavy lifting, attention to lipid management.  We covered the importance of staying never user of tobacco.   -The patient is aware of signs and symptoms of aortic dissection and when to present to the emergency department  -Continue follow up with ENT for dysphagia   -Follow up in 6 months  with CTA of chest   Risk Modification:  Statin:  rosuvastatin   Smoking cessation instruction/counseling given:  never user  Patient was counseled on importance of Blood Pressure Control  They are instructed to contact their Primary Care Physician if they start to have blood pressure readings over 130s/90s. Do not ever stop blood pressure medications on your own, unless instructed by healthcare professional.  Please avoid use of Fluoroquinolones as this can potentially increase your risk of Aortic Rupture and/or Dissection  Patient educated on signs and symptoms of Aortic Dissection, handout also provided in AVS  Manuelita CHRISTELLA Rough, PA-C 05/29/24

## 2024-05-29 NOTE — Patient Instructions (Signed)

## 2024-06-01 DIAGNOSIS — I10 Essential (primary) hypertension: Secondary | ICD-10-CM | POA: Diagnosis not present

## 2024-06-01 MED ORDER — GLIPIZIDE ER 2.5 MG PO TB24: 2.5000 mg | ORAL_TABLET | Freq: Every day | ORAL | 3 refills | Status: DC

## 2024-06-01 NOTE — Addendum Note (Signed)
 Addended by: Martinique Pizzimenti E on: 06/01/2024 03:50 PM   Modules accepted: Orders

## 2024-06-06 ENCOUNTER — Other Ambulatory Visit: Payer: Self-pay | Admitting: Family Medicine

## 2024-06-06 ENCOUNTER — Other Ambulatory Visit (HOSPITAL_COMMUNITY): Payer: Self-pay | Admitting: Cardiology

## 2024-06-07 DIAGNOSIS — Z7901 Long term (current) use of anticoagulants: Secondary | ICD-10-CM | POA: Diagnosis not present

## 2024-06-07 DIAGNOSIS — Z7984 Long term (current) use of oral hypoglycemic drugs: Secondary | ICD-10-CM | POA: Diagnosis not present

## 2024-06-07 DIAGNOSIS — K224 Dyskinesia of esophagus: Secondary | ICD-10-CM | POA: Diagnosis not present

## 2024-06-07 DIAGNOSIS — I10 Essential (primary) hypertension: Secondary | ICD-10-CM | POA: Diagnosis not present

## 2024-06-07 DIAGNOSIS — K219 Gastro-esophageal reflux disease without esophagitis: Secondary | ICD-10-CM | POA: Diagnosis not present

## 2024-06-07 DIAGNOSIS — J392 Other diseases of pharynx: Secondary | ICD-10-CM | POA: Diagnosis not present

## 2024-06-07 DIAGNOSIS — G4730 Sleep apnea, unspecified: Secondary | ICD-10-CM | POA: Diagnosis not present

## 2024-06-07 DIAGNOSIS — E119 Type 2 diabetes mellitus without complications: Secondary | ICD-10-CM | POA: Diagnosis not present

## 2024-06-07 DIAGNOSIS — I4891 Unspecified atrial fibrillation: Secondary | ICD-10-CM | POA: Diagnosis not present

## 2024-06-07 DIAGNOSIS — R1310 Dysphagia, unspecified: Secondary | ICD-10-CM | POA: Diagnosis not present

## 2024-06-07 DIAGNOSIS — M6289 Other specified disorders of muscle: Secondary | ICD-10-CM | POA: Diagnosis not present

## 2024-06-09 MED ORDER — AZELASTINE HCL 0.1 % NA SOLN: 1.0000 | NASAL | 1 refills | Status: AC | PRN

## 2024-06-09 NOTE — Addendum Note (Signed)
 Addended by: JERRELL SOLIAN T on: 06/09/2024 09:04 AM   Modules accepted: Orders

## 2024-06-09 NOTE — Telephone Encounter (Signed)
 Will approve azelastine  nasal spray to use as needed for allergic rhinitis symptoms.

## 2024-06-12 ENCOUNTER — Ambulatory Visit: Admitting: Clinical

## 2024-06-12 ENCOUNTER — Other Ambulatory Visit (HOSPITAL_COMMUNITY): Payer: Self-pay

## 2024-06-13 DIAGNOSIS — K5730 Diverticulosis of large intestine without perforation or abscess without bleeding: Secondary | ICD-10-CM | POA: Diagnosis not present

## 2024-06-13 DIAGNOSIS — D125 Benign neoplasm of sigmoid colon: Secondary | ICD-10-CM | POA: Diagnosis not present

## 2024-06-13 DIAGNOSIS — Z1211 Encounter for screening for malignant neoplasm of colon: Secondary | ICD-10-CM | POA: Diagnosis not present

## 2024-06-13 LAB — HM COLONOSCOPY

## 2024-06-15 DIAGNOSIS — D125 Benign neoplasm of sigmoid colon: Secondary | ICD-10-CM | POA: Diagnosis not present

## 2024-06-26 ENCOUNTER — Ambulatory Visit: Admitting: Clinical

## 2024-06-26 ENCOUNTER — Encounter: Payer: Self-pay | Admitting: Clinical

## 2024-06-26 DIAGNOSIS — F4329 Adjustment disorder with other symptoms: Secondary | ICD-10-CM | POA: Diagnosis not present

## 2024-06-26 NOTE — Progress Notes (Unsigned)
 [facility] Counselor Initial Adult In-Person - Comprehensive Clinical Assessment  Name: [name] [name] Date: 06/26/24 MRN: [id number] DOB: 09-04-1950 PCP: [name] Comer BRAVO, MD  Session Time start: 1:05pm End time: 2pm Total time: 55 min  Types of Service: Comprehensive Clinical Assessment (CCA)  Guardian/Payee:  Self    Paperwork requested: No   [name] [name] participated from office with therapist and consented to treatment. We reviewed the limits of confidentiality prior to the start of the evaluation. [name] [name] expressed understanding and agreed to proceed.   Reason for referral in patient/family's own words:  [name] [name] wants to manage  his anger. 2-3 times a year, he has Anger spells outbursts The last outburst he had was last summer and threw something. -Would like information on how to manage the angry outbursts  Client likes to be called [name] [name].  Primary language at home is English.  Risk Assessment: Danger to Self:  No Self-injurious Behavior: No Danger to Others: No Duty to Warn:no Physical Aggression / Violence:No Access to Firearms a concern: Not reported  Client and/or legal guardian was educated about steps to take if suicide or homicide risk level increases between visits: n/a While future psychiatric events cannot be accurately predicted, the patient does not currently require acute inpatient psychiatric care and does not currently meet [place]  involuntary commitment criteria.  Current Stressors:  Loss - divorce by his first wife years ago. He is more aware of anxiety symptoms when his wife drives, he feels uncomfortable. It's been more intense this past year.   Client and/or Family's Strengths/Protective Factors: Supportive Relationships and Able to Communicate Effectively He likes to read information and be efficient in doing it.   Current Health Habits: Sleep:   Bedtime 10:30pm, last 9-10 months started CPAP, sleeps  really well. Wake up around 7am. Previous symptoms of severe headaches that woke him up, tried medicine but it didn't work. Headaches went away after using the CPAP.  Physical Activities:  Walks every day with his wife, works in the yard, psychologist, educational sometimes Plants around the house- wife was part of [employer]  Eating/Appetite: Good meals & snacks - healthy options  Current Medications and therapies:  Psychotropic medications: Current medications: None reported Client taking medications as prescribed:  N/A Side effects reported: No  Therapies:  In the past behavioral therapy, agency unknown. Addressed the divorce that he experienced years ago.  Psychiatric Review of systems: Insomnia: No Changes in appetite: No Decreased need for sleep: No Hallucinations: No   Paranoia: No    Psychiatric History: Past psychiatry diagnosis: Unknown Patient currently being seen by therapist/psychiatrist:  No Prior Suicide Attempts: No Past psychiatry Hospitalization(s): No Past history of violence: Never tried to hurt himself or others intentionally  Social History:  Living situation: Lives with second wife 26 years of marriage, met at church Relationship status: Married Number of Children if applicable: 3 daughters & 6 granddaughters 1 step-son  Employment:  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 Likes to stay busy now since he is no longer working, he takes care of things around the house. 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 Drinks coffee Have you recently consumed alcohol? no  Have you recently used any drugs, eg marijuana, other substances or prescriptions drugs not prescribed to them?  no  Have you recently consumed any tobacco or nicotine? no Does client seem concerned about dependence or abuse of any substance? no  Traumatic Experiences/Abuse history: Have you ever been exposed, witnessed or experienced any form of abuse, what type?  No  Have you ever been exposed, witnessed or experienced something traumatic, describe?  Wife asked for divorce in 19 abruptly. Never gotten over the trauma of the abrupt divorce.  At that time, they had a 72 yo and 72 yo twins (all girls). His ex-wife moved to [facility] in July 1993 and he moved in Sept 1993. His ex-wife lives in [place] and his children lives in the area.  Family of Origin (Childhood History) Grew up in [place] . Mother may had clinical depression, experienced ovarian cancer when he was about 73 yo, and he noticed the mood difference after she had the cancer Mother died when he was a sophomore in college around 72 yo. He received a phone call that his mother died in a car in her garage. She was living in [place]  at that time after his parents divorced.   Had a brother who was in the army. He had leukemia and died when he was 72 yo, around 1978/  Family history: Family mental illness:  Mother possibly with depression Family history of bipolar disorder: No Family school achievement history:  No information   Family History:  Family History  Problem Relation Age of Onset   Lung cancer Father        hx of smoking    Cancer Father    Leukemia Brother        Passed at age of 69   Hypertension Other    Prostate cancer Other    Colon cancer Neg Hx    Pancreatic cancer Neg Hx    Rectal cancer Neg Hx    Stomach cancer Neg Hx    Asthma Neg Hx    Immunodeficiency Neg Hx    Eczema Neg Hx    Atopy Neg Hx    Urticaria Neg  Hx    Allergic rhinitis Neg Hx    Angioedema Neg Hx    Esophageal cancer Neg Hx    Migraines Neg Hx      Client Medical history  Medical History/Surgical History: reviewed' Past Medical History:  Diagnosis Date   Acute gastritis    Allergy  Child   Aortic insufficiency    s/p Ross Procedure: AVR/Pulmonary autograft in aortic position & PVR with bioprosthetic PV 1994 in [place]   Aortic root aneurysm    Arthritis 2020   Atrial flutter (HCC)    history- no atrial flutter since cardioversion   CHF (congestive heart failure) (HCC)    Diabetes mellitus without complication (HCC)    type 2   Early satiety    Esophageal stricture    GERD (gastroesophageal reflux disease)    Headache syndrome 10/18/2018   Heart murmur Child   Hiatal hernia    Hyperlipidemia    Hypertension Mid 1990's   Neuropathy    left brachial plexus   OSA on CPAP    mild obstructive sleep apnea overall but moderate during REM sleep with REM AHI 16.6/h and overall AHI 6.6/h.  In auto CPAP from 4 to 15 cm H2O   Pain, dental  SCC (squamous cell carcinoma) 02/10/2023   right forearm    Past Surgical History:  Procedure Laterality Date   AORTIC VALVE REPLACEMENT  08/03/1992   CARDIAC VALVE REPLACEMENT  1994   CARDIOVERSION N/A 03/14/2020   Procedure: CARDIOVERSION;  Surgeon: Rolan Ezra RAMAN, MD;  [facility] END[facility]COPY;  Service: Cardiovascular;  Laterality: N/A;   ESOPHAGEAL MANOMETRY N/A 09/23/2022   Procedure: ESOPHAGEAL MANOMETRY (EM);  Surgeon: Shila Gustav GAILS, MD;  Location: [facility] ENDOSCOPY;  Service: Gastroenterology;  Laterality: N/A;   ESOPHAGOGASTRODUODENOSCOPY N/A 03/30/2022   Procedure: ESOPHAGOGASTRODUODENOSCOPY (EGD);  Surgeon: Shyrl Linnie KIDD, MD;  Location: [facility] OR;  Service: Thoracic;  Laterality: N/A;   GANGLION CYST EXCISION  08/03/2000   HERNIA REPAIR  Unknown   HIATAL HERNIA REPAIR  07/03/2001   INTERCOSTAL NERVE BLOCK Right 03/30/2022   Procedure: INTERCOSTAL NERVE BLOCK;   Surgeon: Shyrl Linnie KIDD, MD;  Location: [facility] OR;  Service: Thoracic;  Laterality: Right;   TEE WITHOUT CARDIOVERSION N/A 03/14/2020   Procedure: TRANSESOPHAGEAL ECHOCARDIOGRAM (TEE);  Surgeon: Rolan Ezra RAMAN, MD;  Location: [facility] ENDOSCOPY;  Service: Cardiovascular;  Laterality: N/A;   TONSILLECTOMY     VASECTOMY  07/03/2001    Medications: Current Outpatient Medications  Medication Sig Dispense Refill   apixaban  (ELIQUIS ) 5 MG TABS tablet Take 1 tablet (5 mg total) by mouth 2 (two) times daily. 180 tablet 3   azelastine  (ASTELIN ) 0.1 % nasal spray Place 1 spray into both nostrils as needed for rhinitis. Use in each nostril as directed 30 mL 1   cetirizine  (ZYRTEC ) 10 MG tablet TAKE 1 TABLET(10 MG) BY MOUTH DAILY 90 tablet 0   empagliflozin  (JARDIANCE ) 10 MG TABS tablet Take 1 tablet (10 mg total) by mouth daily before breakfast. Take 1 tablet (10 mg total) by mouth daily before breakfast. 90 tablet 3   famotidine (PEPCID) 40 MG tablet Take 40 mg by mouth as needed for heartburn or indigestion.     finasteride  (PROSCAR ) 5 MG tablet TAKE 1 TABLET(5 MG) BY MOUTH DAILY 90 tablet 1   fluticasone  (FLONASE ) 50 MCG/ACT nasal spray Place 2 sprays into both nostrils as needed for allergies or rhinitis. 50 mL 30   furosemide  (LASIX ) 20 MG tablet Take 1 tablet (20 mg total) by mouth every other day. 45 tablet 3   glipiZIDE  (GLUCOTROL  XL) 2.5 MG 24 hr tablet Take 1 tablet (2.5 mg total) by mouth daily with breakfast. 30 tablet 3   ipratropium (ATROVENT ) 0.06 % nasal spray Place 2 sprays into both nostrils daily.     losartan  (COZAAR ) 25 MG tablet Take 1 tablet (25 mg total) by mouth daily. 90 tablet 3   metoprolol  succinate (TOPROL -XL) 100 MG 24 hr tablet Take 1 tablet (100 mg total) by mouth 2 (two) times daily. Take with or immediately following a meal. 180 tablet 3   montelukast  (SINGULAIR ) 10 MG tablet Take 1 tablet (10 mg total) by mouth at bedtime. 30 tablet 6   pantoprazole  (PROTONIX ) 40 MG  tablet TAKE 1 TABLET(40 MG) BY MOUTH DAILY 90 tablet 0   rosuvastatin  (CRESTOR ) 40 MG tablet TAKE 1 TABLET(40 MG) BY MOUTH DAILY 90 tablet 3   spironolactone  (ALDACTONE ) 25 MG tablet TAKE 1 TABLET(25 MG) BY MOUTH DAILY 90 tablet 3   No current facility-administered medications for this visit.    Allergies  Allergen Reactions   Penicillins Hives and Swelling   Quinidine Other (See Comments)    Increase heart beat per patient     Interventions: Interventions  utilized: This Clinician reviewed information on new patient paperwork, explained services, identified presenting concerns, explored goals and built rapport. Obtained additional information for comprehensive assessment and developed general plan of care.  Psychoeducation and/or Health Education   Client Family Response:  Mr. Sparks was open to therapy and identified his goals. Understanding the angry outbursts and how to manage his anger. Understanding anxiety when he's in the car, when he's not driving.  Mr. Hain reported he thinks that the abrupt divorce continues to have an effect on him.  He also reported that he describes  himself as the following: Very politically far right person.- conservative Common sense, literal He reported that the political climate may also be affecting his stress and contributing to his anger.   Mental status exam:   General Appearance Siegfried:  Casual Eye Contact:  Good Motor Behavior:  Normal Speech:  Normal Level of Consciousness:  Alert Mood:  Anxious Affect:  Appropriate Anxiety Level:  Minimal Thought Process:  Coherent Thought Content:  WNL Perception:  Normal Judgment:  Good Insight:  Present   Clinical Assessment: Mr. Dudenhoeffer is a 73 yo male who presents with various stressors and previous trauma that may be contributing to his anger outbursts.  Although the outbursts don't occur frequently, Mr. Labreck wants to gain understanding and manage his  anger.   Diagnosis: Adjustment disorder with other symptom  Coordination of Care: Written progress or summary reports in medical chart   Recommendations for Services/Supports/Treatments: Outpatient individual psycho therapy  This Clinician will email info/worksheets Anger Management to email address in chart.  Follow up Plan: Therapist answered all questions during the evaluation and contact information was provided.   Scheduled follow up virtual visit.  Davius Goudeau P. Trudy, MSW, LCSW [employer] Therapist Main Office: [phone]

## 2024-06-27 NOTE — Progress Notes (Signed)
  Behavioral Health Treatment Plan   Name:[name]  MRN: [id number]   Treatment Plan Development Date: 06/26/24   Strengths:  Supportive Relationships and Able to Communicate Effectively He likes to read information and be efficient in doing it.  Supports: Spouse   Client Statement of Needs: Wants to manage his anger & anxiety symptoms   Treatment Level:Outpatient Individual Psycho therapy  Client Treatment Preferences: InPerson or Virtual/Telemedicine   Diagnosis: Adjustment Disorder  Symptoms: - Increased stress and anxiety symptoms in the car when he's not driving - 1-2 times year, he presents with angry outbursts  Goals:  Reduce anxiety and stress using cognitive and behavioral techniques to manage symptoms.  Objectives: Target Date For All Objectives: 06/03/2025  Promote positive behavior change and encourage healthy and productive activities.  Progress Documentation:   Progressing  Interventions:  Cognitive Behavioral Therapy and Psycho-education/Bibliotherapy   Expected duration of treatment: 9-12 months  Party responsible for implementation of interventions: This therapist.   This plan has been reviewed and created by the following participants: [name] [name] and DOROTHA Pouch, LCSW   A new plan will be created at least every 12 months.  The patient fully participated in the development of treatment plan with the clinician and verbally consents to such treatment.   Patient Treatment Plan Signature Obtained: No, pending signature via MyChart.   Bazil Dhanani SHAUNNA Pouch, LCSW

## 2024-07-05 ENCOUNTER — Encounter: Payer: Self-pay | Admitting: Clinical

## 2024-07-06 ENCOUNTER — Ambulatory Visit: Admitting: Family Medicine

## 2024-07-06 ENCOUNTER — Ambulatory Visit: Payer: Self-pay | Admitting: Family Medicine

## 2024-07-06 ENCOUNTER — Encounter: Payer: Self-pay | Admitting: Family Medicine

## 2024-07-06 VITALS — BP 128/70 | HR 60 | Temp 97.9°F | Ht 74.0 in | Wt 219.6 lb

## 2024-07-06 DIAGNOSIS — E785 Hyperlipidemia, unspecified: Secondary | ICD-10-CM

## 2024-07-06 DIAGNOSIS — I10 Essential (primary) hypertension: Secondary | ICD-10-CM | POA: Diagnosis not present

## 2024-07-06 DIAGNOSIS — E1169 Type 2 diabetes mellitus with other specified complication: Secondary | ICD-10-CM

## 2024-07-06 DIAGNOSIS — Z7984 Long term (current) use of oral hypoglycemic drugs: Secondary | ICD-10-CM | POA: Diagnosis not present

## 2024-07-06 DIAGNOSIS — E118 Type 2 diabetes mellitus with unspecified complications: Secondary | ICD-10-CM | POA: Diagnosis not present

## 2024-07-06 LAB — TSH: TSH: 1.13 u[IU]/mL (ref 0.35–5.50)

## 2024-07-06 LAB — HEMOGLOBIN A1C: Hgb A1c MFr Bld: 7.2 % — ABNORMAL HIGH (ref 4.6–6.5)

## 2024-07-06 LAB — HEPATIC FUNCTION PANEL
ALT: 18 U/L (ref 0–53)
AST: 19 U/L (ref 0–37)
Albumin: 4.5 g/dL (ref 3.5–5.2)
Alkaline Phosphatase: 58 U/L (ref 39–117)
Bilirubin, Direct: 0.3 mg/dL (ref 0.0–0.3)
Total Bilirubin: 1.7 mg/dL — ABNORMAL HIGH (ref 0.2–1.2)
Total Protein: 7.1 g/dL (ref 6.0–8.3)

## 2024-07-06 LAB — LIPID PANEL
Cholesterol: 134 mg/dL (ref 0–200)
HDL: 51.5 mg/dL (ref >39.00)
LDL Cholesterol: 63 mg/dL (ref 0–99)
NonHDL: 82.96
Total CHOL/HDL Ratio: 3
Triglycerides: 98 mg/dL (ref 0.0–149.0)
VLDL: 19.6 mg/dL (ref 0.0–40.0)

## 2024-07-06 LAB — BASIC METABOLIC PANEL WITH GFR
BUN: 15 mg/dL (ref 6–23)
CO2: 28 meq/L (ref 19–32)
Calcium: 9.4 mg/dL (ref 8.4–10.5)
Chloride: 100 meq/L (ref 96–112)
Creatinine, Ser: 0.63 mg/dL (ref 0.40–1.50)
GFR: 95.39 mL/min (ref >60.00)
Glucose, Bld: 127 mg/dL — ABNORMAL HIGH (ref 70–99)
Potassium: 4.6 meq/L (ref 3.5–5.1)
Sodium: 137 meq/L (ref 135–145)

## 2024-07-06 LAB — CBC WITH DIFFERENTIAL/PLATELET
Basophils Absolute: 0 K/uL (ref 0.0–0.1)
Basophils Relative: 0.3 % (ref 0.0–3.0)
Eosinophils Absolute: 0.1 K/uL (ref 0.0–0.7)
Eosinophils Relative: 0.6 % (ref 0.0–5.0)
HCT: 44.5 % (ref 39.0–52.0)
Hemoglobin: 15 g/dL (ref 13.0–17.0)
Lymphocytes Relative: 15.7 % (ref 12.0–46.0)
Lymphs Abs: 1.3 K/uL (ref 0.7–4.0)
MCHC: 33.6 g/dL (ref 30.0–36.0)
MCV: 90 fl (ref 78.0–100.0)
Monocytes Absolute: 0.8 K/uL (ref 0.1–1.0)
Monocytes Relative: 8.8 % (ref 3.0–12.0)
Neutro Abs: 6.4 K/uL (ref 1.4–7.7)
Neutrophils Relative %: 74.6 % (ref 43.0–77.0)
Platelets: 262 K/uL (ref 150.0–400.0)
RBC: 4.95 Mil/uL (ref 4.22–5.81)
RDW: 14 % (ref 11.5–15.5)
WBC: 8.5 K/uL (ref 4.0–10.5)

## 2024-07-06 NOTE — Progress Notes (Signed)
   Subjective:    Patient ID: [name] [name], male    DOB: [birth year], 73 y.o.   MRN: [id number]  HPI DM- chronic problem, on Glipizide  2.5mg  daily, Jardiance  10mg  daily.  Last A1C 8.1%  UTD on eye exam, foot exam, microalbumin.  Pt is tolerated Glipizide  w/o difficulty.    HTN- chronic problem, on Spironolactone  25mg  daily, Metoprolol  XL 100mg  BID, Losartan  25mg  daily, Lasix  20mg  daily w/ good control.  No CP, SOB, HA's, visual changes, edema.  Hyperlipidemia- chronic problem, on Crestor  40mg  daily.  No abd pain, N/V.   Review of Systems For ROS see HPI     Objective:   Physical Exam Vitals reviewed.  Constitutional:      General: He is not in acute distress.    Appearance: Normal appearance. He is well-developed. He is not ill-appearing.  HENT:     Head: Normocephalic and atraumatic.  Eyes:     Extraocular Movements: Extraocular movements intact.     Conjunctiva/sclera: Conjunctivae normal.     Pupils: Pupils are equal, round, and reactive to light.  Neck:     Thyroid : No thyromegaly.  Cardiovascular:     Rate and Rhythm: Normal rate and regular rhythm.     Pulses: Normal pulses.     Heart sounds: Murmur (valvular click) heard.  Pulmonary:     Effort: Pulmonary effort is normal. No respiratory distress.     Breath sounds: Normal breath sounds.  Abdominal:     General: Bowel sounds are normal. There is no distension.     Palpations: Abdomen is soft.  Musculoskeletal:     Cervical back: Normal range of motion and neck supple.     Right lower leg: No edema.     Left lower leg: No edema.  Lymphadenopathy:     Cervical: No cervical adenopathy.  Skin:    General: Skin is warm and dry.  Neurological:     General: No focal deficit present.     Mental Status: He is alert and oriented to person, place, and time.     Cranial Nerves: No cranial nerve deficit.  Psychiatric:        Mood and Affect: Mood normal.        Behavior: Behavior normal.           Assessment  & Plan:

## 2024-07-06 NOTE — Patient Instructions (Signed)
 Follow up in 3-4 months to recheck sugar We'll notify you of your lab results and make any changes if needed Keep up the good work on healthy diet and regular exercise- you're doing great! Call with any questions or concerns Stay Safe!  Stay Healthy! Merry Christmas!!!

## 2024-07-10 ENCOUNTER — Ambulatory Visit (HOSPITAL_COMMUNITY): Admission: RE | Admit: 2024-07-10 | Discharge: 2024-07-10 | Attending: Adult Health | Admitting: Adult Health

## 2024-07-10 DIAGNOSIS — I5022 Chronic systolic (congestive) heart failure: Secondary | ICD-10-CM

## 2024-07-10 LAB — ECHOCARDIOGRAM COMPLETE
AR max vel: 5.22 cm2
AV Area VTI: 5.26 cm2
AV Area mean vel: 5.72 cm2
AV Mean grad: 2 mmHg
AV Peak grad: 3.5 mmHg
Ao pk vel: 0.94 m/s
Area-P 1/2: 4.04 cm2
Calc EF: 56.2 %
S' Lateral: 3.8 cm
Single Plane A2C EF: 48 %
Single Plane A4C EF: 59.9 %

## 2024-07-15 ENCOUNTER — Other Ambulatory Visit: Payer: Self-pay | Admitting: Family Medicine

## 2024-07-17 ENCOUNTER — Ambulatory Visit: Admitting: Clinical

## 2024-07-20 ENCOUNTER — Other Ambulatory Visit (HOSPITAL_COMMUNITY): Payer: Self-pay | Admitting: Cardiology

## 2024-07-21 ENCOUNTER — Other Ambulatory Visit: Payer: Self-pay

## 2024-07-21 DIAGNOSIS — G540 Brachial plexus disorders: Secondary | ICD-10-CM

## 2024-07-21 MED ORDER — FINASTERIDE 5 MG PO TABS: ORAL_TABLET | ORAL | 1 refills | Status: AC

## 2024-07-25 ENCOUNTER — Ambulatory Visit (HOSPITAL_COMMUNITY): Payer: Self-pay | Admitting: Adult Health

## 2024-07-31 ENCOUNTER — Ambulatory Visit: Admitting: Clinical

## 2024-08-01 ENCOUNTER — Other Ambulatory Visit: Payer: Self-pay

## 2024-08-02 NOTE — Assessment & Plan Note (Signed)
 Chronic problem.  On Crestor  40mg  daily w/o difficulty.

## 2024-08-02 NOTE — Assessment & Plan Note (Signed)
 Chronic problem.  On Spironolactone , Metoprolol , Losartan , and Lasix  w/ good control.  Currently asymptomatic.  Check labs due to ARB and diuretic use.  Will follow.

## 2024-08-02 NOTE — Assessment & Plan Note (Signed)
 Chronic problem.  On Glipizide  and Jardiance  10mg  daily.  Currently asymptomatic.  UTD on eye exam, foot exam, microalbumin.  Check labs.  Adjust meds prn

## 2024-08-04 MED ORDER — GLIPIZIDE ER 2.5 MG PO TB24: 2.5000 mg | ORAL_TABLET | Freq: Every day | ORAL | 0 refills | Status: AC

## 2024-08-13 ENCOUNTER — Other Ambulatory Visit: Payer: Self-pay | Admitting: Gastroenterology

## 2024-08-13 DIAGNOSIS — K219 Gastro-esophageal reflux disease without esophagitis: Secondary | ICD-10-CM

## 2024-08-14 NOTE — Telephone Encounter (Signed)
 He has seen another GI practice but because we never responded to the patients request to schedule a colonoscopy I am refilling his Protonix 

## 2024-08-15 ENCOUNTER — Telehealth (HOSPITAL_COMMUNITY): Payer: Self-pay

## 2024-08-15 NOTE — Telephone Encounter (Signed)
 Advanced Heart Failure Patient Advocate Encounter  The patient was approved for a [employer] grant that will help cover the cost of Eliquis , Jardiance , Losartan , Metoprolol , Spironolactone .  Total amount awarded, $7,500.  Effective: 07/16/2024 - 07/15/2025.  BIN [id number] PCN [id number] [id number] ID [id number]  Pharmacy provided with approval and processing information. Patient informed via phone and [employer]PS.  Rachel DEL, CPhT Rx Patient Advocate Phone: [phone]

## 2024-08-16 ENCOUNTER — Other Ambulatory Visit (HOSPITAL_COMMUNITY): Payer: Self-pay

## 2024-08-21 ENCOUNTER — Ambulatory Visit: Admitting: Family Medicine

## 2024-08-21 ENCOUNTER — Encounter: Payer: Self-pay | Admitting: Family Medicine

## 2024-08-21 VITALS — BP 130/80 | HR 60 | Ht 74.0 in | Wt 225.2 lb

## 2024-08-21 DIAGNOSIS — L602 Onychogryphosis: Secondary | ICD-10-CM | POA: Diagnosis not present

## 2024-08-21 DIAGNOSIS — B351 Tinea unguium: Secondary | ICD-10-CM | POA: Diagnosis not present

## 2024-08-21 DIAGNOSIS — L608 Other nail disorders: Secondary | ICD-10-CM | POA: Diagnosis not present

## 2024-08-21 DIAGNOSIS — D2271 Melanocytic nevi of right lower limb, including hip: Secondary | ICD-10-CM | POA: Insufficient documentation

## 2024-08-21 NOTE — Patient Instructions (Signed)
 Follow up as needed or as scheduled We'll call you to schedule your Podiatry appt Wear shoes w/ a wide, deep toe box to avoid pressure Call with any questions or concerns Stay Safe!  Stay Healthy! Hang in there!!

## 2024-08-21 NOTE — Progress Notes (Signed)
" ° °  Subjective:    Patient ID: [name] [name], male    DOB: Dec 11, 1950, 74 y.o.   MRN: [id number]  HPI Thickened toenail- sxs started ~3 months ago.  Started using topical oil w/o relief.  R foot, 2nd toe.  Had similar sxs are few years ago and thickened part of nail fell off and there was a new nail behind it.  Pt reports nail is thickened, growing downward, is tender to put shoes on at times.   Review of Systems For ROS see HPI     Objective:   Physical Exam Vitals reviewed.  Constitutional:      General: He is not in acute distress.    Appearance: Normal appearance. He is not ill-appearing.  HENT:     Head: Normocephalic and atraumatic.  Cardiovascular:     Pulses: Normal pulses.  Skin:    General: Skin is warm and dry.     Comments: R great toenail is yellow and thickened, 2nd toenail is yellow and more thickened that the great nail, TTP on top of nail  Neurological:     General: No focal deficit present.     Mental Status: He is alert and oriented to person, place, and time.  Psychiatric:        Mood and Affect: Mood normal.        Behavior: Behavior normal.        Thought Content: Thought content normal.           Assessment & Plan:  Toenail fungus w/ nail thickening and deformity- new.  It appears that 2nd toenail will need to be thinned/dremmeled to improve discomfort.  Will refer to podiatry for complete evaluation and tx.  Pt expressed understanding and is in agreement w/ plan.   "

## 2024-08-22 ENCOUNTER — Ambulatory Visit: Admitting: Behavioral Health

## 2024-08-22 ENCOUNTER — Encounter: Payer: Self-pay | Admitting: Behavioral Health

## 2024-08-22 DIAGNOSIS — F4329 Adjustment disorder with other symptoms: Secondary | ICD-10-CM

## 2024-08-22 NOTE — Progress Notes (Signed)
   [name] [name], [facility]

## 2024-08-22 NOTE — Progress Notes (Signed)
 Photographer Health Counselor Initial Adult Exam  Name: [name] [name] Date: 08/22/2024 MRN: [id number] DOB: [birth year] PCP: [name] Comer BRAVO, MD  Time spent: 10 AM until 10:58 AM, 58 minutes spent face-to-face with the patient in the outpatient therapist office.  Guardian/Payee: Self  Paperwork requested: No   Reason for Visit /Presenting Problem: Situational anxiety, some OCD type tendencies, irritability  [name] [name] is a 74 year old married male who presents with symptoms of anxiety with some infrequent history of irritability.  He currently lives with his wife [name] [name] whom he has been married to for 26 years.  He reports that is a great relationship.  He has 3 adult daughters ages 24 and twins who are 77 from a previous marriage.  He has 12 grandchildren.  He is close to all of his daughters and his grandchildren.  His care was transferred to me from a previous therapist in our [place] office.  He said he felt that there were some differences but indicated that he felt she was very kind and a great therapist.  He has been in therapy previously about 5 years ago but said it was helpful and he met for 6 or 7 sessions to process some past issues.  The patient was raised by his biological parents and with his brother.  He felt that for the most part his childhood was fairly normal.  He said that he knew his mother had depression and that she had ovarian cancer when he was in high school.  She died when he was in college almost 74 years old.  She had been to visit her parents in [place]  from [place]  where the patient grew up.  He said she was a smoker and that she had gone out into her parents garage to smoke and they found her dead in the car in the garage with the car running.  It was never clearly determined if it was accidental or suicidal and he says there are no clear answers now.  He said that he found out later for some letters that his step mom's family found that his parents  had difficulty when he was growing up but he and his brother never knew about it.  His father did remarry after his mother died but he died in 09/12/05 of lung cancer.  His brother died when the patient was about 46 of leukemia.  He did have a good relationship with his stepmother and it was her granddaughter the found some of the things that his father had saved such as letters back-and-forth between his brother and father which gave him some information that he did not have.  He said for the most part he had a very normal relationship with his father until his father's death.  The patient married fairly young to his first wife in [place]  and had daughters.  1 his oldest daughter was 100 and his twin girls were 3 his wife came to him and said she wanted a divorce.  There had been a situation in which Valentine's Day 1993 he found a Valentine on his wife's nightstand which simply said from me.  It was not his and when he took a downstairs and asked his wife about the car her response was old boy and you do not need to know who it is.  Mom to further explain that her sister would never have been that casual or careless.  He said she basically was saying that there was infidelity but was not  apologizing for it.  When she came to him to say she wanted a divorce she basically said I have family in [place]  and I am moving and taking out her daughters there.  He describes himself as shocked and recognize that he may have been raenette Ruth living as a husband and father thinking things were good.  He said it hurts him today and knows there is some residual anger associated to that.  His wife left and took his daughter moving to 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  As pure hell.SABRA  He says he feels he is still not gotten over it.  Moved down here trying to find a place to live and start finding a way to support himself and  his daughters.  He said there were no legal custody agreements and it was just agreed upon verbally that he would have his daughter on the weekends.  He did have them some on Wednesday nights until they started school.  He was a very active part of his daughter's life and said that he and his wife were at least cordial and caring for their daughters.  He said to this day he still does not know why she asked for divorce.  His previous therapist did ask him to write a letter to his ex-wife which she read to the therapist she recognizes that he was still very angry.  He says over the years he has tried to deal with about compartmentalizing the bad memories but trying to blend in the good ones because he had a lot of great memories with his daughters growing up and has a great relationship with his wife.  He reported that a part of what referred him to therapy this time was an episode in which he got very angry.  He is stating that we raise his voice but he does not think that he threw anything.  He cannot remember the last time something like that happened and cannot pinpoint why that episode happened.  He said the anger episodes only happen once every 1 or 2 years and they go very quickly.  He never necessarily hurts anything or anyone.  He does report some OCD type tendencies saying everything has to be in place and that he is not very happy if it is not.  He does not report any repetitive motion type of things such as checking the door or the stove or counting his steps but says that things that are at least visible or in his eyesight need to be put away or in the place they are supposed to be.  He said it is the same way in the yard and that everything has to be a list.  His wife is a retired interior and spatial designer of keep [employer] so the yard is beautiful and they both spend a lot of time working in it.  Acknowledges that he has to be busy all the time and is constantly looking for something to do if he does not  have something to do.  He is a teacher, adult education of music and says he always has a piece of music that he is working on in case he runs out of things to do he can go back to that.  His degrees including a masters degree very music and he was a education officer, museum church care for many years.  He  said during COVID when charges shutdown he stopped going to church but faith and spirituality are still a part of him and most of his music reflects that.  Church is no longer support but he still has friends from church that he considers supports including his wife and his daughters.  He reports that he has only situational anxiety in general and most of that is when his wife is driving.  He said that is only been for the past 4 or 5 years.  He reports that he would much rather be driving and he knows that she is a good driver but he still gets anxious in the passenger seat.  He does not really ride with anyone else.  He does not get anxious when he drives.  He has some general anxiety about the state of affairs in the world including leadership in the country as well as fighting and political division.  He considers himself to be an independent politically and cannot understand why there is so much fighting.  When asked about physical signs anxiety he said basically he does not have butterflies or tension or pacing.  He reports his sleep is good.  He started on his CPAP machine about a year and a half ago after years of insistence by his cardiologist.  He basically sleeps from 1030 every night until 730 the next morning and sleeps well.  He reports no hallucinations or bad dreams or nightmares.  Did have some bad dreams when he took Mounjaro  recently but his doctor switched him off of that to glipizide  and he has had no bad dreams since then.  He has been diagnosed with diabetes but is not taking any injections instead taking Jardiance  and glipizide  which he manages.  He said he did have an episode of A-fib 4 years ago for  which he is taking Eliquis  and takes medication for blood pressure and cholesterol.  Over the past several years he and his wife have been eating much healthier and says he only has a few chocolate cravings.  He does have a cup of coffee in the morning and rare sweet tea.  He reports no other caffeine use.  He reports only occasional glass of wine or beer.  He reports no history of drug use or tobacco use.  He said the most the music he writes and composes is for his benefit and he gifts that usually 2 people with no charge.  He says that is good therapy for him.  I did introduce relaxation breathing and a brief grounding exercise encouraging him to practice it before we meet again in 3 weeks.  The patient does contract for safety having no thoughts of hurting himself or anyone else.  He reports no history of self-harm or suicidal ideation.  Mental Status Exam: Appearance:   Well Groomed     Behavior:  Appropriate  Motor:  Normal  Speech/Language:   Clear and Coherent  Affect:  Appropriate  Mood:  normal  Thought process:  normal  Thought content:    WNL  Sensory/Perceptual disturbances:    WNL  Orientation:  oriented to person, place, time/date, situation, day of week, month of year, and year  Attention:  Good  Concentration:  Good  Memory:  WNL  Fund of knowledge:   Good  Insight:    Good  Judgment:   Good  Impulse Control:  Good    Risk Assessment: Danger to Self:  No Self-injurious Behavior: No Danger to Others:  No Duty to Warn:no Physical Aggression / Violence:No  Access to Firearms a concern: No  Gang Involvement:No  Patient / guardian was educated about steps to take if suicide or homicide risk level increases between visits: n/a While future psychiatric events cannot be accurately predicted, the patient does not currently require acute inpatient psychiatric care and does not currently meet [facility]  involuntary commitment criteria.  Substance Abuse  History: Current substance abuse: No     Past Psychiatric History:   Previous psychological history is significant for anxiety Outpatient Providers: Primary care physician History of Psych Hospitalization: No  Psychological Testing: Not applicable   Abuse History:  Victim of: No., None reported   Report needed: No. Victim of Neglect:No. Perpetrator of none reported  Witness / Exposure to Domestic Violence: None reported  Protective Services Involvement: No  Witness to [employer] Violence:  No   Family History:  Family History  Problem Relation Age of Onset   Lung cancer Father        hx of smoking    Cancer Father    Leukemia Brother        Passed at age of 17   Hypertension Other    Prostate cancer Other    Colon cancer Neg Hx    Pancreatic cancer Neg Hx    Rectal cancer Neg Hx    Stomach cancer Neg Hx    Asthma Neg Hx    Immunodeficiency Neg Hx    Eczema Neg Hx    Atopy Neg Hx    Urticaria Neg Hx    Allergic rhinitis Neg Hx    Angioedema Neg Hx    Esophageal cancer Neg Hx    Migraines Neg Hx     Living situation: the patient lives with their spouse  Sexual Orientation: Straight  Relationship Status: married  Name of spouse / other: Lamarr If a parent, number of children / ages:'s 3 adult daughters age 77 and twins age 37.  He has 7 grandchildren ages 60 to 2 months.  Support Systems: spouse friends Children and grandchildren  Surveyor, Quantity Stress:  No   Income/Employment/Disability: Neurosurgeon: No   Educational History: Education: post engineer, maintenance (it) work or degree  Religion/Sprituality/World View: Protestant  Any cultural differences that may affect / interfere with treatment:  not applicable   Recreation/Hobbies: Writing and composing music, gardening and working on psychologist, occupational  Stressors: Traumatic event    Strengths: Supportive Relationships, Family, Friends, Spirituality, Hopefulness, Journalist, Newspaper, and Able  to Communicate Effectively  Barriers:     Legal History: Pending legal issue / charges: The patient has no significant history of legal issues. History of legal issue / charges: Not applicable  Medical History/Surgical History: reviewed Past Medical History:  Diagnosis Date   Acute gastritis    Allergy  Child   Anxiety    Aortic insufficiency    s/p Ross Procedure: AVR/Pulmonary autograft in aortic position & PVR with bioprosthetic PV 1994 in [place]   Aortic root aneurysm    Arthritis 2020   Atrial flutter (HCC)    history- no atrial flutter since cardioversion   CHF (congestive heart failure) (HCC)    Diabetes mellitus without complication (HCC)    type 2   Early satiety    Esophageal stricture    GERD (gastroesophageal reflux disease)    Headache syndrome 10/18/2018   Heart murmur Child   Hiatal hernia    Hyperlipidemia    Hypertension Mid 1990s   Neuropathy  left brachial plexus   OSA on CPAP    mild obstructive sleep apnea overall but moderate during REM sleep with REM AHI 16.6/h and overall AHI 6.6/h.  In auto CPAP from 4 to 15 cm H2O   Pain, dental    SCC (squamous cell carcinoma) 02/10/2023   right forearm    Past Surgical History:  Procedure Laterality Date   AORTIC VALVE REPLACEMENT  08/03/1992   CARDIAC VALVE REPLACEMENT  1994   CARDIOVERSION N/A 03/14/2020   Procedure: CARDIOVERSION;  Surgeon: Rolan Ezra RAMAN, MD;  [facility] END[facility]COPY;  Service: Cardiovascular;  Laterality: N/A;   ESOPHAGEAL MANOMETRY N/A 09/23/2022   Procedure: ESOPHAGEAL MANOMETRY (EM);  Surgeon: Shila Gustav GAILS, MD;  Location: [facility] ENDOSCOPY;  Service: Gastroenterology;  Laterality: N/A;   ESOPHAGOGASTRODUODENOSCOPY N/A 03/30/2022   Procedure: ESOPHAGOGASTRODUODENOSCOPY (EGD);  Surgeon: Shyrl Linnie KIDD, MD;  Location[facility];  Service: Thoracic;  Laterality: N/A;   GANGLION CYST EXCISION  08/03/2000   HERNIA REPAIR  Unknown   HIATAL HERNIA REPAIR  07/03/2001    INTERCOSTAL NERVE BLOCK Right 03/30/2022   Procedure: INTERCOSTAL NERVE BLOCK;  Surgeon: Shyrl Linnie KIDD, MD;  Location: [facility] OR;  Service: Thoracic;  Laterality: Right;   TEE WITHOUT CARDIOVERSION N/A 03/14/2020   Procedure: TRANSESOPHAGEAL ECHOCARDIOGRAM (TEE);  Surgeon: Rolan Ezra RAMAN, MD;  Location[facility] ENDOSCOPY;  Service: Cardiovascular;  Laterality: N/A;   TONSILLECTOMY     VASECTOMY  07/03/2001    Medications: Current Outpatient Medications  Medication Sig Dispense Refill   azelastine  (ASTELIN ) 0.1 % nasal spray Place 1 spray into both nostrils as needed for rhinitis. Use in each nostril as directed 30 mL 1   cetirizine  (ZYRTEC ) 10 MG tablet TAKE 1 TABLET(10 MG) BY MOUTH DAILY 90 tablet 0   ELIQUIS  5 MG TABS tablet TAKE 1 TABLET(5 MG) BY MOUTH TWICE DAILY 180 tablet 3   empagliflozin  (JARDIANCE ) 10 MG TABS tablet Take 1 tablet (10 mg total) by mouth daily before breakfast. Take 1 tablet (10 mg total) by mouth daily before breakfast. 90 tablet 3   famotidine (PEPCID) 40 MG tablet Take 40 mg by mouth as needed for heartburn or indigestion.     finasteride  (PROSCAR ) 5 MG tablet TAKE 1 TABLET(5 MG) BY MOUTH DAILY 90 tablet 1   fluticasone  (FLONASE ) 50 MCG/ACT nasal spray Place 2 sprays into both nostrils as needed for allergies or rhinitis. 50 mL 30   furosemide  (LASIX ) 20 MG tablet Take 1 tablet (20 mg total) by mouth every other day. 45 tablet 3   glipiZIDE  (GLUCOTROL  XL) 2.5 MG 24 hr tablet Take 1 tablet (2.5 mg total) by mouth daily with breakfast. 90 tablet 0   ipratropium (ATROVENT ) 0.06 % nasal spray Place 2 sprays into both nostrils daily.     losartan  (COZAAR ) 25 MG tablet Take 1 tablet (25 mg total) by mouth daily. 90 tablet 3   metoprolol  succinate (TOPROL -XL) 100 MG 24 hr tablet Take 1 tablet (100 mg total) by mouth 2 (two) times daily. Take with or immediately following a meal. 180 tablet 3   montelukast  (SINGULAIR ) 10 MG tablet TAKE 1 TABLET(10 MG) BY MOUTH AT BEDTIME 30  tablet 6   pantoprazole  (PROTONIX ) 40 MG tablet TAKE 1 TABLET(40 MG) BY MOUTH DAILY 90 tablet 0   rosuvastatin  (CRESTOR ) 40 MG tablet TAKE 1 TABLET(40 MG) BY MOUTH DAILY 90 tablet 3   spironolactone  (ALDACTONE ) 25 MG tablet TAKE 1 TABLET(25 MG) BY MOUTH DAILY 90 tablet 3   No  current facility-administered medications for this visit.    Allergies[1]  Diagnoses: Adjustment disorder with other symptoms  Plan of Care: Will meet with the patient in person every 2 to 3 weeks.  A formal treatment plan will be provided in the second session.   Lorrene CHRISTELLA Hasten, [facility]        [1]  Allergies Allergen Reactions   Penicillins Hives and Swelling   Quinidine Other (See Comments)    Increase heart beat per patient   "

## 2024-08-23 ENCOUNTER — Other Ambulatory Visit (HOSPITAL_COMMUNITY): Payer: Self-pay | Admitting: Cardiology

## 2024-08-24 ENCOUNTER — Encounter: Payer: Self-pay | Admitting: Podiatry

## 2024-08-24 ENCOUNTER — Ambulatory Visit: Admitting: Podiatry

## 2024-08-24 DIAGNOSIS — M79675 Pain in left toe(s): Secondary | ICD-10-CM

## 2024-08-24 DIAGNOSIS — M79674 Pain in right toe(s): Secondary | ICD-10-CM

## 2024-08-24 DIAGNOSIS — B351 Tinea unguium: Secondary | ICD-10-CM | POA: Diagnosis not present

## 2024-08-24 DIAGNOSIS — E118 Type 2 diabetes mellitus with unspecified complications: Secondary | ICD-10-CM | POA: Diagnosis not present

## 2024-08-24 DIAGNOSIS — D689 Coagulation defect, unspecified: Secondary | ICD-10-CM | POA: Diagnosis not present

## 2024-08-24 NOTE — Progress Notes (Signed)
"  °  Subjective:  Patient ID: [name] [name], male    DOB: Aug 11, 1950,  MRN: [id number]  Chief Complaint  Patient presents with   Nail Problem    R 2nd toe has become discolored and very thick. He is unable to cut it.  A1c 7.2 12/25. Eliquis     74 y.o. male presents with the above complaint. History confirmed with patient. Patient presenting with pain related to dystrophic thickened elongated nails. Patient is unable to trim own nails related to nail dystrophy and/or mobility issues. Patient does have a history of T2DM.  Most recent A1c 7.2.  He does take long-term Eliquis .  Other contributory past medical history includes but is not limited to venous stasis dermatitis with venous insufficiency, neurogenic claudication, aortic atherosclerosis, degenerative disc disease  Objective:  Physical Exam: warm, good capillary refill, slightly decreased pedal hair growth nail exam onychomycosis of the toenails, onycholysis, and dystrophic nails greater than 3 mm thickening DP pulses palpable, PT pulses palpable, and protective sensation intact, telangiectasias noted Left Foot:  Pain with palpation of nails due to elongation and dystrophic growth.  Mild hammertoe contractures Right Foot: Pain with palpation of nails due to elongation and dystrophic growth.  Mild hammertoe contractures  Assessment:   1. Controlled diabetes mellitus type 2 with complications (HCC)   2. Pain due to onychomycosis of toenails of both feet   3. Coagulation defect      Plan:  Patient was evaluated and treated and all questions answered.  #Onychomycosis with pain  -Nails palliatively debrided as below. -Educated on self-care - At risk footcare due to diabetes anticoagulated on Eliquis  - Right second toenail especially dystrophic and painful with shoe gear.  We did discuss removal of the toenail if necessary as well as medical management. - Will proceed with regular scheduled nail debridement at this point and also  recommend applying Vicks VapoRub to affected dystrophic toenails nightly  Procedure: Nail Debridement Rationale: Pain Type of Debridement: manual, sharp debridement. Instrumentation: Nail nipper, rotary burr. Number of Nails: 10  Patient educated on diabetes. Discussed proper diabetic foot care and discussed risks and complications of disease. Educated patient in depth on reasons to return to the office immediately should he/she discover anything concerning or new on the feet. All questions answered. Discussed proper shoes as well.    Return in about 3 months (around 11/22/2024) for Diabetic Foot Care.         Ethan Saddler, DPM [facility] / [facility]                   "

## 2024-08-25 ENCOUNTER — Encounter (HOSPITAL_COMMUNITY): Payer: Self-pay | Admitting: Cardiology

## 2024-08-30 ENCOUNTER — Other Ambulatory Visit: Payer: Self-pay | Admitting: Internal Medicine

## 2024-09-01 ENCOUNTER — Encounter: Payer: Self-pay | Admitting: Family Medicine

## 2024-09-01 MED ORDER — IPRATROPIUM BROMIDE 0.06 % NA SOLN: 2.0000 | Freq: Every day | NASAL | 3 refills | Status: AC

## 2024-09-01 NOTE — Telephone Encounter (Signed)
 Patient notes difficulty reaching asthma and allergy  to request a refill and is asking if you can send in Ipratropium nasal spray for the time being

## 2024-09-12 ENCOUNTER — Ambulatory Visit: Admitting: Behavioral Health

## 2024-09-12 ENCOUNTER — Ambulatory Visit (HOSPITAL_COMMUNITY): Admitting: Cardiology

## 2024-10-04 ENCOUNTER — Ambulatory Visit: Admitting: Family Medicine

## 2024-10-25 ENCOUNTER — Ambulatory Visit: Admitting: Dermatology

## 2024-11-10 ENCOUNTER — Ambulatory Visit: Admitting: Cardiology

## 2024-11-23 ENCOUNTER — Ambulatory Visit: Admitting: Podiatry

## 2025-01-05 ENCOUNTER — Encounter: Admitting: Family Medicine

## 2025-02-06 ENCOUNTER — Encounter
